# Patient Record
Sex: Female | Born: 1960 | Race: White | Hispanic: No | Smoking: Never smoker
Health system: Southern US, Community
[De-identification: ages and names within clinical notes are randomized; demographics above are authoritative.]

## PROBLEM LIST (undated history)

## (undated) DIAGNOSIS — I519 Heart disease, unspecified: Secondary | ICD-10-CM

## (undated) DIAGNOSIS — Z9289 Personal history of other medical treatment: Secondary | ICD-10-CM

## (undated) DIAGNOSIS — K219 Gastro-esophageal reflux disease without esophagitis: Secondary | ICD-10-CM

## (undated) DIAGNOSIS — T8859XA Other complications of anesthesia, initial encounter: Secondary | ICD-10-CM

## (undated) DIAGNOSIS — J309 Allergic rhinitis, unspecified: Secondary | ICD-10-CM

## (undated) DIAGNOSIS — I341 Nonrheumatic mitral (valve) prolapse: Secondary | ICD-10-CM

## (undated) DIAGNOSIS — Z8719 Personal history of other diseases of the digestive system: Secondary | ICD-10-CM

## (undated) DIAGNOSIS — J479 Bronchiectasis, uncomplicated: Secondary | ICD-10-CM

## (undated) DIAGNOSIS — Z9889 Other specified postprocedural states: Secondary | ICD-10-CM

## (undated) DIAGNOSIS — Z87442 Personal history of urinary calculi: Secondary | ICD-10-CM

## (undated) DIAGNOSIS — R112 Nausea with vomiting, unspecified: Secondary | ICD-10-CM

## (undated) DIAGNOSIS — D649 Anemia, unspecified: Secondary | ICD-10-CM

## (undated) DIAGNOSIS — N879 Dysplasia of cervix uteri, unspecified: Secondary | ICD-10-CM

## (undated) DIAGNOSIS — I219 Acute myocardial infarction, unspecified: Secondary | ICD-10-CM

## (undated) DIAGNOSIS — N760 Acute vaginitis: Secondary | ICD-10-CM

## (undated) DIAGNOSIS — M1990 Unspecified osteoarthritis, unspecified site: Secondary | ICD-10-CM

## (undated) DIAGNOSIS — Z8674 Personal history of sudden cardiac arrest: Secondary | ICD-10-CM

## (undated) DIAGNOSIS — N189 Chronic kidney disease, unspecified: Secondary | ICD-10-CM

## (undated) DIAGNOSIS — J45909 Unspecified asthma, uncomplicated: Secondary | ICD-10-CM

## (undated) DIAGNOSIS — R519 Headache, unspecified: Secondary | ICD-10-CM

## (undated) DIAGNOSIS — G4730 Sleep apnea, unspecified: Secondary | ICD-10-CM

## (undated) DIAGNOSIS — T7840XA Allergy, unspecified, initial encounter: Secondary | ICD-10-CM

## (undated) DIAGNOSIS — R87619 Unspecified abnormal cytological findings in specimens from cervix uteri: Secondary | ICD-10-CM

## (undated) DIAGNOSIS — J45991 Cough variant asthma: Secondary | ICD-10-CM

## (undated) DIAGNOSIS — F419 Anxiety disorder, unspecified: Secondary | ICD-10-CM

## (undated) DIAGNOSIS — T4145XA Adverse effect of unspecified anesthetic, initial encounter: Secondary | ICD-10-CM

## (undated) DIAGNOSIS — J449 Chronic obstructive pulmonary disease, unspecified: Secondary | ICD-10-CM

## (undated) DIAGNOSIS — J189 Pneumonia, unspecified organism: Secondary | ICD-10-CM

## (undated) DIAGNOSIS — K589 Irritable bowel syndrome without diarrhea: Secondary | ICD-10-CM

## (undated) DIAGNOSIS — H269 Unspecified cataract: Secondary | ICD-10-CM

## (undated) DIAGNOSIS — I10 Essential (primary) hypertension: Secondary | ICD-10-CM

## (undated) DIAGNOSIS — I209 Angina pectoris, unspecified: Secondary | ICD-10-CM

## (undated) DIAGNOSIS — I5189 Other ill-defined heart diseases: Secondary | ICD-10-CM

## (undated) DIAGNOSIS — I509 Heart failure, unspecified: Secondary | ICD-10-CM

## (undated) DIAGNOSIS — K900 Celiac disease: Secondary | ICD-10-CM

## (undated) DIAGNOSIS — I2510 Atherosclerotic heart disease of native coronary artery without angina pectoris: Secondary | ICD-10-CM

## (undated) DIAGNOSIS — N951 Menopausal and female climacteric states: Secondary | ICD-10-CM

## (undated) DIAGNOSIS — E7800 Pure hypercholesterolemia, unspecified: Secondary | ICD-10-CM

## (undated) HISTORY — DX: Unspecified abnormal cytological findings in specimens from cervix uteri: R87.619

## (undated) HISTORY — DX: Acute vaginitis: N76.0

## (undated) HISTORY — DX: Sleep apnea, unspecified: G47.30

## (undated) HISTORY — DX: Unspecified cataract: H26.9

## (undated) HISTORY — DX: Personal history of other medical treatment: Z92.89

## (undated) HISTORY — DX: Nonrheumatic mitral (valve) prolapse: I34.1

## (undated) HISTORY — DX: Celiac disease: K90.0

## (undated) HISTORY — DX: Anxiety disorder, unspecified: F41.9

## (undated) HISTORY — DX: Anemia, unspecified: D64.9

## (undated) HISTORY — DX: Heart disease, unspecified: I51.9

## (undated) HISTORY — DX: Dysplasia of cervix uteri, unspecified: N87.9

## (undated) HISTORY — DX: Menopausal and female climacteric states: N95.1

## (undated) HISTORY — PX: CARDIAC SURGERY: SHX584

## (undated) HISTORY — DX: Allergic rhinitis, unspecified: J30.9

## (undated) HISTORY — DX: Gastro-esophageal reflux disease without esophagitis: K21.9

## (undated) HISTORY — DX: Chronic obstructive pulmonary disease, unspecified: J44.9

## (undated) HISTORY — PX: BACK SURGERY: SHX140

## (undated) HISTORY — DX: Allergy, unspecified, initial encounter: T78.40XA

---
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
## 1980-01-09 HISTORY — PX: OOPHORECTOMY: SHX86

## 1986-01-08 DIAGNOSIS — R87619 Unspecified abnormal cytological findings in specimens from cervix uteri: Secondary | ICD-10-CM

## 1986-01-08 DIAGNOSIS — N879 Dysplasia of cervix uteri, unspecified: Secondary | ICD-10-CM

## 1986-01-08 HISTORY — PX: OTHER SURGICAL HISTORY: SHX169

## 1986-01-08 HISTORY — DX: Dysplasia of cervix uteri, unspecified: N87.9

## 1986-01-08 HISTORY — DX: Unspecified abnormal cytological findings in specimens from cervix uteri: R87.619

## 1999-01-09 HISTORY — PX: DILATION AND CURETTAGE OF UTERUS: SHX78

## 2007-10-08 ENCOUNTER — Ambulatory Visit: Payer: Self-pay | Admitting: Internal Medicine

## 2007-10-20 ENCOUNTER — Ambulatory Visit: Payer: Self-pay | Admitting: Internal Medicine

## 2009-10-04 ENCOUNTER — Ambulatory Visit: Payer: Self-pay | Admitting: Internal Medicine

## 2010-06-28 ENCOUNTER — Ambulatory Visit: Payer: Self-pay | Admitting: Internal Medicine

## 2010-07-03 ENCOUNTER — Ambulatory Visit: Payer: Self-pay | Admitting: Internal Medicine

## 2010-07-06 ENCOUNTER — Ambulatory Visit: Payer: Self-pay | Admitting: Internal Medicine

## 2010-08-09 HISTORY — PX: COLONOSCOPY: SHX174

## 2010-08-09 HISTORY — PX: ESOPHAGOGASTRODUODENOSCOPY: SHX1529

## 2010-09-08 ENCOUNTER — Ambulatory Visit: Payer: Self-pay | Admitting: Unknown Physician Specialty

## 2010-09-11 ENCOUNTER — Emergency Department: Payer: Self-pay | Admitting: Unknown Physician Specialty

## 2010-09-14 LAB — PATHOLOGY REPORT

## 2011-01-09 HISTORY — PX: ABLATION: SHX5711

## 2011-01-09 HISTORY — PX: COMBINED HYSTEROSCOPY DIAGNOSTIC / D&C: SUR297

## 2011-05-25 ENCOUNTER — Emergency Department: Payer: Self-pay | Admitting: Unknown Physician Specialty

## 2011-05-25 LAB — COMPREHENSIVE METABOLIC PANEL
Albumin: 3.5 g/dL (ref 3.4–5.0)
Alkaline Phosphatase: 82 U/L (ref 50–136)
Anion Gap: 10 (ref 7–16)
BUN: 16 mg/dL (ref 7–18)
Bilirubin,Total: 0.2 mg/dL (ref 0.2–1.0)
Calcium, Total: 8.3 mg/dL — ABNORMAL LOW (ref 8.5–10.1)
Chloride: 108 mmol/L — ABNORMAL HIGH (ref 98–107)
Co2: 23 mmol/L (ref 21–32)
Creatinine: 0.95 mg/dL (ref 0.60–1.30)
EGFR (African American): >60
EGFR (Non-African Amer.): >60
Glucose: 113 mg/dL — ABNORMAL HIGH (ref 65–99)
Osmolality: 283 (ref 275–301)
Potassium: 3.5 mmol/L (ref 3.5–5.1)
SGOT(AST): 17 U/L (ref 15–37)
SGPT (ALT): 16 U/L
Sodium: 141 mmol/L (ref 136–145)
Total Protein: 7.1 g/dL (ref 6.4–8.2)

## 2011-05-25 LAB — LIPASE, BLOOD: Lipase: 151 U/L (ref 73–393)

## 2011-05-25 LAB — CK TOTAL AND CKMB (NOT AT ARMC)
CK, Total: 38 U/L (ref 21–215)
CK-MB: <0.5 ng/mL — ABNORMAL LOW (ref 0.5–3.6)

## 2011-05-25 LAB — CBC
HCT: 42.6 % (ref 35.0–47.0)
HGB: 14.3 g/dL (ref 12.0–16.0)
MCH: 30.3 pg (ref 26.0–34.0)
MCHC: 33.6 g/dL (ref 32.0–36.0)
MCV: 90 fL (ref 80–100)
Platelet: 262 10*3/uL (ref 150–440)
RBC: 4.73 10*6/uL (ref 3.80–5.20)
RDW: 13.4 % (ref 11.5–14.5)
WBC: 8.8 10*3/uL (ref 3.6–11.0)

## 2011-05-25 LAB — MAGNESIUM: Magnesium: 1.7 mg/dL — ABNORMAL LOW

## 2011-05-25 LAB — TROPONIN I: Troponin-I: <0.02 ng/mL

## 2011-05-27 HISTORY — PX: OTHER SURGICAL HISTORY: SHX169

## 2011-06-10 ENCOUNTER — Emergency Department: Payer: Self-pay | Admitting: Emergency Medicine

## 2011-06-10 LAB — CBC
HCT: 43.9 % (ref 35.0–47.0)
HGB: 14.6 g/dL (ref 12.0–16.0)
MCH: 29.8 pg (ref 26.0–34.0)
MCHC: 33.2 g/dL (ref 32.0–36.0)
MCV: 90 fL (ref 80–100)
Platelet: 237 10*3/uL (ref 150–440)
RBC: 4.9 10*6/uL (ref 3.80–5.20)
RDW: 13.3 % (ref 11.5–14.5)
WBC: 6.2 10*3/uL (ref 3.6–11.0)

## 2011-06-10 LAB — BASIC METABOLIC PANEL
Anion Gap: 11 (ref 7–16)
BUN: 12 mg/dL (ref 7–18)
Calcium, Total: 9.1 mg/dL (ref 8.5–10.1)
Chloride: 108 mmol/L — ABNORMAL HIGH (ref 98–107)
Co2: 23 mmol/L (ref 21–32)
Creatinine: 0.89 mg/dL (ref 0.60–1.30)
EGFR (African American): >60
EGFR (Non-African Amer.): >60
Glucose: 128 mg/dL — ABNORMAL HIGH (ref 65–99)
Osmolality: 285 (ref 275–301)
Potassium: 3.5 mmol/L (ref 3.5–5.1)
Sodium: 142 mmol/L (ref 136–145)

## 2011-06-10 LAB — CK TOTAL AND CKMB (NOT AT ARMC)
CK, Total: 61 U/L (ref 21–215)
CK-MB: 2.8 ng/mL (ref 0.5–3.6)

## 2011-06-10 LAB — TROPONIN I: Troponin-I: 0.05 ng/mL

## 2011-10-03 ENCOUNTER — Ambulatory Visit: Payer: Self-pay | Admitting: Internal Medicine

## 2011-11-26 ENCOUNTER — Ambulatory Visit: Payer: Self-pay | Admitting: Obstetrics and Gynecology

## 2011-11-26 LAB — CBC
HCT: 30.1 % — ABNORMAL LOW (ref 35.0–47.0)
HGB: 9.8 g/dL — ABNORMAL LOW (ref 12.0–16.0)
MCH: 27.5 pg (ref 26.0–34.0)
MCHC: 32.6 g/dL (ref 32.0–36.0)
MCV: 84 fL (ref 80–100)
Platelet: 313 10*3/uL (ref 150–440)
RBC: 3.58 10*6/uL — ABNORMAL LOW (ref 3.80–5.20)
RDW: 14.9 % — ABNORMAL HIGH (ref 11.5–14.5)
WBC: 7.6 10*3/uL (ref 3.6–11.0)

## 2011-11-26 LAB — BASIC METABOLIC PANEL
Anion Gap: 5 — ABNORMAL LOW (ref 7–16)
BUN: 14 mg/dL (ref 7–18)
Calcium, Total: 8.8 mg/dL (ref 8.5–10.1)
Chloride: 107 mmol/L (ref 98–107)
Co2: 25 mmol/L (ref 21–32)
Creatinine: 0.72 mg/dL (ref 0.60–1.30)
EGFR (African American): >60
EGFR (Non-African Amer.): >60
Glucose: 83 mg/dL (ref 65–99)
Osmolality: 273 (ref 275–301)
Potassium: 4.2 mmol/L (ref 3.5–5.1)
Sodium: 137 mmol/L (ref 136–145)

## 2011-11-29 ENCOUNTER — Ambulatory Visit: Payer: Self-pay | Admitting: Obstetrics and Gynecology

## 2011-12-03 LAB — PATHOLOGY REPORT

## 2012-01-29 ENCOUNTER — Ambulatory Visit: Payer: Self-pay | Admitting: Internal Medicine

## 2012-04-21 ENCOUNTER — Ambulatory Visit: Payer: Self-pay | Admitting: Internal Medicine

## 2012-04-22 ENCOUNTER — Encounter: Payer: Self-pay | Admitting: Internal Medicine

## 2012-05-08 ENCOUNTER — Encounter: Payer: Self-pay | Admitting: Internal Medicine

## 2012-06-08 ENCOUNTER — Encounter: Payer: Self-pay | Admitting: Internal Medicine

## 2012-07-08 ENCOUNTER — Encounter: Payer: Self-pay | Admitting: Internal Medicine

## 2012-10-31 DIAGNOSIS — I10 Essential (primary) hypertension: Secondary | ICD-10-CM | POA: Insufficient documentation

## 2012-10-31 DIAGNOSIS — I2510 Atherosclerotic heart disease of native coronary artery without angina pectoris: Secondary | ICD-10-CM | POA: Insufficient documentation

## 2012-10-31 DIAGNOSIS — Z955 Presence of coronary angioplasty implant and graft: Secondary | ICD-10-CM | POA: Insufficient documentation

## 2012-10-31 DIAGNOSIS — E7800 Pure hypercholesterolemia, unspecified: Secondary | ICD-10-CM | POA: Insufficient documentation

## 2012-12-01 ENCOUNTER — Ambulatory Visit (INDEPENDENT_AMBULATORY_CARE_PROVIDER_SITE_OTHER): Payer: BC Managed Care – PPO

## 2012-12-01 ENCOUNTER — Encounter: Payer: Self-pay | Admitting: Podiatry

## 2012-12-01 ENCOUNTER — Ambulatory Visit (INDEPENDENT_AMBULATORY_CARE_PROVIDER_SITE_OTHER): Payer: BC Managed Care – PPO | Admitting: Podiatry

## 2012-12-01 VITALS — BP 129/86 | HR 72 | Resp 16 | Ht 65.0 in | Wt 180.0 lb

## 2012-12-01 DIAGNOSIS — M79671 Pain in right foot: Secondary | ICD-10-CM

## 2012-12-01 DIAGNOSIS — M79609 Pain in unspecified limb: Secondary | ICD-10-CM

## 2012-12-01 DIAGNOSIS — M722 Plantar fascial fibromatosis: Secondary | ICD-10-CM

## 2012-12-01 DIAGNOSIS — S92309A Fracture of unspecified metatarsal bone(s), unspecified foot, initial encounter for closed fracture: Secondary | ICD-10-CM

## 2012-12-01 NOTE — Progress Notes (Signed)
N PAIN L RIGHT FOOT LATERAL D 22M O SUDDEN C WORSE A WALKING,NIGHT TIME, T WEARS CAM WALKER, MULTIPLE BRACES, SEEN DR Katrinka Blazing AT [facility], X-RAYS,

## 2012-12-01 NOTE — Progress Notes (Signed)
Brenda Craig is a 52 year old white female who presents today with a chief complaint of pain to her right foot x8 months. [facility] having just received a coronary artery stent when she fell hurting her right fifth metatarsal. She states that the pain was so severe that she threw up all of her the place. She has since seen Dr. Tamala Julian who initially thought it was a sprain been a strain and a possible hairline fracture. He has gone from stabilizers to a Cam Walker at this point. Never identifying the fracture.  Objective: Vital signs are stable she is alert and oriented x3 I have reviewed her past medical history medications and allergies as well as her review of systems. Pulses are strongly palpable to the bilateral lower extremity. Neurologic sensorium is intact bilateral deep tendon reflexes are symmetrical and equal bilateral. Muscle strength is 5 over 5 dorsiflexors plantar flexors inverters and evertors. Orthopedic evaluation Mr. is all joints distal to the ankle have a full range of motion without crepitation. She does have pain on direct palpation of the fifth metatarsal base right foot. Radiographic evaluation does demonstrate a semi-oblique fracture of the very base of the fifth metatarsal. Lateral radiograph does not demonstrate this but the AP and the oblique do. 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  Assessment: Nonunion fifth metatarsal base right  Plan: Continue use of the Cam Walker and we are going to ask for an Exogen bone stimulator. I will followup with her in one month

## 2012-12-29 ENCOUNTER — Ambulatory Visit: Payer: BC Managed Care – PPO | Admitting: Podiatry

## 2013-01-17 DIAGNOSIS — K219 Gastro-esophageal reflux disease without esophagitis: Secondary | ICD-10-CM

## 2013-01-17 DIAGNOSIS — I25118 Atherosclerotic heart disease of native coronary artery with other forms of angina pectoris: Secondary | ICD-10-CM | POA: Insufficient documentation

## 2013-01-17 DIAGNOSIS — J45901 Unspecified asthma with (acute) exacerbation: Secondary | ICD-10-CM | POA: Insufficient documentation

## 2013-01-17 DIAGNOSIS — F419 Anxiety disorder, unspecified: Secondary | ICD-10-CM | POA: Insufficient documentation

## 2013-01-17 DIAGNOSIS — I10 Essential (primary) hypertension: Secondary | ICD-10-CM | POA: Insufficient documentation

## 2013-01-17 DIAGNOSIS — I2510 Atherosclerotic heart disease of native coronary artery without angina pectoris: Secondary | ICD-10-CM | POA: Insufficient documentation

## 2013-01-17 DIAGNOSIS — E785 Hyperlipidemia, unspecified: Secondary | ICD-10-CM | POA: Insufficient documentation

## 2013-01-17 DIAGNOSIS — J45909 Unspecified asthma, uncomplicated: Secondary | ICD-10-CM | POA: Insufficient documentation

## 2013-01-17 HISTORY — DX: Gastro-esophageal reflux disease without esophagitis: K21.9

## 2013-02-02 ENCOUNTER — Encounter: Payer: Self-pay | Admitting: Podiatry

## 2013-02-02 ENCOUNTER — Ambulatory Visit (INDEPENDENT_AMBULATORY_CARE_PROVIDER_SITE_OTHER): Payer: BC Managed Care – PPO | Admitting: Podiatry

## 2013-02-02 ENCOUNTER — Ambulatory Visit (INDEPENDENT_AMBULATORY_CARE_PROVIDER_SITE_OTHER): Payer: BC Managed Care – PPO

## 2013-02-02 VITALS — BP 135/83 | HR 68 | Resp 16

## 2013-02-02 DIAGNOSIS — S92901A Unspecified fracture of right foot, initial encounter for closed fracture: Secondary | ICD-10-CM

## 2013-02-02 DIAGNOSIS — M7750 Other enthesopathy of unspecified foot: Secondary | ICD-10-CM

## 2013-02-02 DIAGNOSIS — M7670 Peroneal tendinitis, unspecified leg: Secondary | ICD-10-CM

## 2013-02-02 NOTE — Progress Notes (Signed)
The right foot not feeling any better. She states that she's been wearing the boot religiously and she still having pain over the fifth metatarsal and the peroneal tendons area. She also continues to use her bone stimulator.  Objective: Vital signs are stable she is alert and oriented x3. Pulses are strongly palpable right. She still has tenderness on palpation of the fifth metatarsal the right foot. Without edema. She has tenderness on palpation of the peroneal tendons just at the takeoff from the fifth met base and the cuboid. At this point all conservative therapies seem to have failed as far as pain goes radiographically the fifth metatarsal base looks much better.  Assessment: Slowly healing fifth metatarsal base right. Continued just is painful along the fifth met base and the peroneal tendons.   Plan: At this point since we may made no headway in the pain department I suggested an MRI to rule out a tear of the peroneal tendons.

## 2013-02-04 ENCOUNTER — Telehealth: Payer: Self-pay | Admitting: *Deleted

## 2013-02-04 NOTE — Telephone Encounter (Signed)
Brenda Craig called from [place]. Brenda Craig #[id number] valid 1.27.15 thru 2.25.15 for MRI of right foot.

## 2013-02-11 ENCOUNTER — Ambulatory Visit: Payer: Self-pay | Admitting: Podiatry

## 2013-02-11 ENCOUNTER — Ambulatory Visit: Payer: Self-pay | Admitting: Orthopedic Surgery

## 2013-02-17 ENCOUNTER — Telehealth: Payer: Self-pay | Admitting: *Deleted

## 2013-02-17 NOTE — Telephone Encounter (Signed)
Pt called asking if we received her MRI. i called pt and left voicemail letting her know we did receive MRI and dr Milinda Pointer is going to have it sent out for re-read. Stated once 2nd read comes in we will call pt to make appt and go over results.

## 2013-03-17 ENCOUNTER — Encounter: Payer: Self-pay | Admitting: Podiatry

## 2013-03-19 ENCOUNTER — Ambulatory Visit (INDEPENDENT_AMBULATORY_CARE_PROVIDER_SITE_OTHER): Payer: BC Managed Care – PPO | Admitting: Podiatry

## 2013-03-19 VITALS — BP 115/75 | HR 82 | Resp 16 | Ht 66.0 in | Wt 181.0 lb

## 2013-03-19 DIAGNOSIS — S92901A Unspecified fracture of right foot, initial encounter for closed fracture: Secondary | ICD-10-CM

## 2013-03-19 DIAGNOSIS — M7670 Peroneal tendinitis, unspecified leg: Secondary | ICD-10-CM

## 2013-03-19 DIAGNOSIS — S92909A Unspecified fracture of unspecified foot, initial encounter for closed fracture: Secondary | ICD-10-CM

## 2013-03-19 DIAGNOSIS — M7750 Other enthesopathy of unspecified foot: Secondary | ICD-10-CM

## 2013-03-20 NOTE — Progress Notes (Signed)
She presents today for followup of her MRI results to her right foot. She states that the right foot still hurts particularly in here as he points to the fifth metatarsal base of the right foot. She continues to wear her Cam Gilford Rile a regular basis.  Objective: Vital signs are stable she is alert and oriented x3. Presents today in her [employer]. Pulses are intact and she has pain on palpation of the peroneal brevis as it near its insertion on the fifth metatarsal base. 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  Assessment: Peroneal tendinitis fifth metatarsal base fracture right foot.  Plan: Discussed etiology pathology conservative versus surgical therapies. I encouraged her to continue use her bone stimulator. We did discuss the need for surgical intervention. I will followup with her at the end of April for a may surgery. 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  X-rays will be taken when she comes in for surgical consent.

## 2013-04-02 ENCOUNTER — Ambulatory Visit: Payer: BC Managed Care – PPO | Admitting: Podiatry

## 2013-04-21 ENCOUNTER — Ambulatory Visit: Payer: Self-pay | Admitting: Internal Medicine

## 2013-05-11 ENCOUNTER — Ambulatory Visit: Payer: BC Managed Care – PPO | Admitting: Podiatry

## 2013-05-19 ENCOUNTER — Telehealth: Payer: Self-pay | Admitting: Podiatry

## 2013-05-19 NOTE — Telephone Encounter (Signed)
CALLED PT TO LET HER KNOW THAT WE WOULD NEED A SIGNED RELEASE FOR THE INFORMATION THAT SHE IS REQUESTING. THAT THE REQUESTING PROVIDER CAN SEND [place] THAT AND WE COULD THEN GET EVERYTHING TAKEN CARE OF FOR HER.

## 2013-05-25 DIAGNOSIS — H53121 Transient visual loss, right eye: Secondary | ICD-10-CM | POA: Insufficient documentation

## 2013-05-27 ENCOUNTER — Ambulatory Visit (INDEPENDENT_AMBULATORY_CARE_PROVIDER_SITE_OTHER): Payer: BC Managed Care – PPO

## 2013-05-27 ENCOUNTER — Ambulatory Visit (INDEPENDENT_AMBULATORY_CARE_PROVIDER_SITE_OTHER): Payer: BC Managed Care – PPO | Admitting: Podiatry

## 2013-05-27 VITALS — BP 128/88 | HR 80 | Resp 16

## 2013-05-27 DIAGNOSIS — S92309A Fracture of unspecified metatarsal bone(s), unspecified foot, initial encounter for closed fracture: Secondary | ICD-10-CM

## 2013-05-27 NOTE — Progress Notes (Signed)
Brenda Craig presents today still complaining of pain to the lateral aspect of her right ankle. She states that the fifth metatarsal itself feels much better. An MRI demonstrated an old nonunion to the base of the fifth metatarsal of the right foot which is no longer tender on palpation however she still has pain with her peroneal tendons.  Objective: Vital signs are stable she is alert and oriented x3. Pulses are strongly palpable right. She has pain on plantarflexion and inversion indicative of peroneal longus tendinitis of the right ankle. She has no reproducible pain on palpation of the fifth metatarsal base right and radiographic evaluation does not demonstrate any type of osseous abnormalities in this area.  Assessment: Resolved fifth metatarsal pain. Peroneal longus tendinitis right.  Plan: We discussed the etiology pathology conservative versus surgical therapies. At this point she's going followup with the orthopedist for evaluation of bruising to her buttocks and her right thigh. She's also goes start physical therapy with the orthopedist and I suggested that they include the peroneal tendon as well. I will followup with her in the near future for reevaluation or the fall for surgical consideration is necessary.

## 2013-06-09 ENCOUNTER — Observation Stay: Payer: Self-pay | Admitting: Internal Medicine

## 2013-06-09 LAB — BASIC METABOLIC PANEL
Anion Gap: 8 (ref 7–16)
BUN: 18 mg/dL (ref 7–18)
Calcium, Total: 9.2 mg/dL (ref 8.5–10.1)
Chloride: 106 mmol/L (ref 98–107)
Co2: 24 mmol/L (ref 21–32)
Creatinine: 0.82 mg/dL (ref 0.60–1.30)
EGFR (African American): >60
EGFR (Non-African Amer.): >60
Glucose: 87 mg/dL (ref 65–99)
Osmolality: 277 (ref 275–301)
Potassium: 3.8 mmol/L (ref 3.5–5.1)
Sodium: 138 mmol/L (ref 136–145)

## 2013-06-09 LAB — CBC
HCT: 40.4 % (ref 35.0–47.0)
HGB: 12.9 g/dL (ref 12.0–16.0)
MCH: 29.7 pg (ref 26.0–34.0)
MCHC: 32.1 g/dL (ref 32.0–36.0)
MCV: 93 fL (ref 80–100)
Platelet: 271 10*3/uL (ref 150–440)
RBC: 4.36 10*6/uL (ref 3.80–5.20)
RDW: 15.3 % — ABNORMAL HIGH (ref 11.5–14.5)
WBC: 7.3 10*3/uL (ref 3.6–11.0)

## 2013-06-09 LAB — TROPONIN I
Troponin-I: <0.02 ng/mL
Troponin-I: <0.02 ng/mL

## 2013-06-10 DIAGNOSIS — E785 Hyperlipidemia, unspecified: Secondary | ICD-10-CM

## 2013-06-10 DIAGNOSIS — R079 Chest pain, unspecified: Secondary | ICD-10-CM

## 2013-06-10 LAB — CBC WITH DIFFERENTIAL/PLATELET
Basophil #: 0 10*3/uL (ref 0.0–0.1)
Basophil %: 0.7 %
Eosinophil #: 0.1 10*3/uL (ref 0.0–0.7)
Eosinophil %: 1.6 %
HCT: 36.1 % (ref 35.0–47.0)
HGB: 11.9 g/dL — ABNORMAL LOW (ref 12.0–16.0)
Lymphocyte #: 2 10*3/uL (ref 1.0–3.6)
Lymphocyte %: 31.6 %
MCH: 30.3 pg (ref 26.0–34.0)
MCHC: 33 g/dL (ref 32.0–36.0)
MCV: 92 fL (ref 80–100)
Monocyte #: 0.7 x10 3/mm (ref 0.2–0.9)
Monocyte %: 10.8 %
Neutrophil #: 3.5 10*3/uL (ref 1.4–6.5)
Neutrophil %: 55.3 %
Platelet: 214 10*3/uL (ref 150–440)
RBC: 3.94 10*6/uL (ref 3.80–5.20)
RDW: 15.2 % — ABNORMAL HIGH (ref 11.5–14.5)
WBC: 6.4 10*3/uL (ref 3.6–11.0)

## 2013-06-10 LAB — BASIC METABOLIC PANEL
Anion Gap: 5 — ABNORMAL LOW (ref 7–16)
BUN: 14 mg/dL (ref 7–18)
Calcium, Total: 8.9 mg/dL (ref 8.5–10.1)
Chloride: 109 mmol/L — ABNORMAL HIGH (ref 98–107)
Co2: 29 mmol/L (ref 21–32)
Creatinine: 0.87 mg/dL (ref 0.60–1.30)
EGFR (African American): >60
EGFR (Non-African Amer.): >60
Glucose: 90 mg/dL (ref 65–99)
Osmolality: 285 (ref 275–301)
Potassium: 3.7 mmol/L (ref 3.5–5.1)
Sodium: 143 mmol/L (ref 136–145)

## 2013-06-10 LAB — TROPONIN I: Troponin-I: <0.02 ng/mL

## 2013-06-10 LAB — HCG, QUANTITATIVE, PREGNANCY: Beta Hcg, Quant.: <1 m[IU]/mL — ABNORMAL LOW

## 2013-07-02 ENCOUNTER — Ambulatory Visit: Payer: Self-pay | Admitting: Internal Medicine

## 2013-07-22 ENCOUNTER — Ambulatory Visit: Payer: Self-pay | Admitting: Orthopedic Surgery

## 2013-07-22 DIAGNOSIS — K900 Celiac disease: Secondary | ICD-10-CM | POA: Insufficient documentation

## 2013-08-11 DIAGNOSIS — E663 Overweight: Secondary | ICD-10-CM | POA: Insufficient documentation

## 2013-08-17 ENCOUNTER — Ambulatory Visit: Payer: Self-pay | Admitting: Unknown Physician Specialty

## 2013-08-19 LAB — PATHOLOGY REPORT

## 2013-09-24 ENCOUNTER — Emergency Department: Payer: Self-pay | Admitting: Internal Medicine

## 2013-10-06 ENCOUNTER — Ambulatory Visit: Payer: Self-pay | Admitting: Orthopedic Surgery

## 2014-01-08 HISTORY — PX: HIP SURGERY: SHX245

## 2014-02-19 ENCOUNTER — Ambulatory Visit: Payer: Self-pay | Admitting: Internal Medicine

## 2014-03-09 ENCOUNTER — Ambulatory Visit: Payer: Self-pay | Admitting: Orthopedic Surgery

## 2014-04-23 ENCOUNTER — Ambulatory Visit
Admit: 2014-04-23 | Disposition: A | Discharge status: Home / Self Care | Payer: Self-pay | Attending: Physical Medicine and Rehabilitation | Admitting: Physical Medicine and Rehabilitation

## 2014-05-01 NOTE — Discharge Summary (Signed)
PATIENT NAME:  Brenda Craig, Brenda Craig MR#:  [id number] DATE OF BIRTH:  02-15-1960  DATE OF ADMISSION:  06/09/2013 DATE OF DISCHARGE:  06/10/2013   ADMISSION DIAGNOSIS: Chest pain.   DISCHARGE DIAGNOSES:  1. Chest pain with history of coronary artery disease.  2. Hypertension.  3. Gastroesophageal reflux disease.   CONSULTATIONS: None.   LABORATORY DATA:  White blood cells 6.4, hemoglobin 12, hematocrit 36.1, platelets are 214.  Sodium 143, potassium 3.7, chloride 109, bicarbonate 29, BUN 14, creatinine 0.87, glucose is 90.  Troponins x3 were negative.  MYOVIEW was negative for acute ischemia   HOSPITAL COURSE: A 54 year old female who presented with chest pain/back spasm, who was admitted for chest pain, rule out acute coronary syndrome. For further details, please refer to Dr. Trena Platt H and P.   1. Chest pain. The patient has extensive coronary artery disease history with recent stents. She was admitted to telemetry. Troponins were negative. She underwent a Myoview stress test which did not show eveidence of ischemia. She was continued on her outpatient medications.  2. Hypertension. The patient will continue her outpatient medications. 3. Gastroesophageal reflux disease. The patient was continued on her PPI.  4. Psychiatric. The patient is on Zoloft.   DISCHARGE MEDICATIONS:  1. Protonix 40 mg b.i.d.  2. Lorazepam 1 mg t.i.d.  3. Aspirin 81 mg daily.  4. Singulair 10 mg daily.  5. Brilinta 90 mg b.i.d.  6. Zantac 150 mg at bedtime.  7. Zoloft 50 mg at bedtime.  8. Nitroglycerin sublingual p.r.n. chest pain.  9. Crestor 5 mg daily.  10. Coreg 3.125 b.i.d.   DISCHARGE DIET: Low sodium.   DISCHARGE ACTIVITY: As tolerated.   DISCHARGE FOLLOWUP: The patient will follow up with her cardiologist, Dr. Manuella Ghazi, in 1 week.   TIME SPENT: 40 minutes.   ____________________________ Donell Beers. Benjie Karvonen, MD spm:lb D: 06/10/2013 11:43:11 ET T: 06/10/2013 12:20:12 ET JOB#: [id number]  cc: Alysha Doolan  P. Benjie Karvonen, MD, <Dictator> Randel Pigg, MD, [facility] Shamari Lofquist P Hamdi Vari MD ELECTRONICALLY SIGNED 06/11/2013 12:37

## 2014-05-01 NOTE — Consult Note (Signed)
General Aspect 54 year old female with a known history of cad, cardiac arrest in May 2013, stent at that time, repeat angina with stent placement early 2014, also with history ofhypertension,  celiac disease, GERD, who is being admitted for unstable angina. cardiology was consult in for chest pain.  last night, The patient started having shaky feeling and clamminess  associated with some chest pain, mainly in the back of her chest. It was 7 out of 10 in severity, continued all night. She could not sleep more than few hours.  yesterday she was driving to go for her physical therapy for her hip tendinitis and broken foot, but started feeling thesame symptoms again with sweatiness and clamminess. She was also feeling nauseous and decided to come to the Emergency Department.   in the emergency room, pain was 3 out of 10, with chest pressure/spasm in the back of her chest.she reports symptoms were similar to prior to her old cardiac arrest in 2013.   Present Illness .  PAST MEDICAL HISTORY: 1.  Hypertension.  2.  Celiac disease.  3.  Coronary artery disease, status post stenting x 2.  4.  Prolapsed mitral valve.  5.  GERD.  6.  Colitis.   PAST SURGICAL HISTORY:  C-section.   ALLERGIES: CODEINE AND GLUTEN.   SOCIAL HISTORY: No smoking. Occasional alcohol. No IV drugs of abuse. She works as a Pharmacist, hospital.   FAMILY HISTORY: Father has emphysema, also has coronary artery disease and history of TIA.   Physical Exam:  GEN well developed, well nourished, no acute distress   HEENT hearing intact to voice, moist oral mucosa   NECK supple   RESP normal resp effort  clear BS   CARD Regular rate and rhythm  No murmur   ABD denies tenderness  soft   EXTR negative edema   NEURO motor/sensory function intact   PSYCH alert, A+O to time, place, person, good insight   Review of Systems:  Subjective/Chief Complaint chest pain, malaise   General: Weakness   Skin: No Complaints   ENT: No  Complaints   Eyes: No Complaints   Neck: No Complaints   Respiratory: No Complaints   Cardiovascular: Chest pain or discomfort   Genitourinary: No Complaints   Vascular: No Complaints   Musculoskeletal: No Complaints   Neurologic: No Complaints   Hematologic: No Complaints   Endocrine: No Complaints   Psychiatric: No Complaints   Review of Systems: All other systems were reviewed and found to be negative   Medications/Allergies Reviewed Medications/Allergies reviewed     Stent:    MI:    HTN:    celiacs disease:    prolapse mitral valve:    GERD:    Colitis:    Cesarean Section: 1986 1989       Admit Diagnosis:   CHEST PAIN BACK PAIN: Onset Date: 10-Jun-2013, Status: Active, Description: CHEST PAIN BACK PAIN  Home Medications: Medication Instructions Status  Protonix 40 mg oral delayed release tablet 1 tab(s) orally 2 times a day Active  lorazepam 1 mg oral tablet 1 tab(s) orally 3 times a day Active  aspirin 81 mg oral tablet 1 tab(s) orally once a day (in the morning) Active  Singulair 10 mg oral tablet 1 tab(s) orally once a day (at bedtime) Active  Brilinta 90 mg oral tablet 1 tab(s) orally 2 times a day Active  Zantac 150 oral tablet 1 tab(s) orally once a day (at bedtime) Active  sertraline 50 mg oral tablet 1  tab(s) orally once a day (at bedtime) Active  Nitrostat 0.4 mg 1 tab(s) sublingual every 5 min. X 3 then call 911. Took 2 weeks ago 2 days straight Active  Crestor 5 mg oral tablet 1 tab(s) orally once a day (at bedtime) Active  carvedilol 3.125 mg oral tablet 1 tab(s) orally 2 times a day Active   Lab Results:  Routine Chem:  03-Jun-15 02:09   HCG Betasubunit Quant. Serum  < 1 (1-3  (International Unit)  ----------------- Non-pregnant <5 Weeks Post LMP mIU/mL  3- 4 wk 9 - 130  4- 5 wk 75 - 2,600  5- 6 wk 850 - 20,800  6- 7 wk 4,000 - 100,000  7-12 wk 11,500 - 289,000 12-16 wk 18,000 - 137,000 16-29 wk 1,400 - 53,000 29-41 wk  940 - 60,000)  Glucose, Serum 90  BUN 14  Creatinine (comp) 0.87  Sodium, Serum 143  Potassium, Serum 3.7  Chloride, Serum  109  CO2, Serum 29  Calcium (Total), Serum 8.9  Anion Gap  5  Osmolality (calc) 285  eGFR (African American) >60  eGFR (Non-African American) >60 (eGFR values <41m/min/1.73 m2 may be an indication of chronic kidney disease (CKD). Calculated eGFR is useful in patients with stable renal function. The eGFR calculation will not be reliable in acutely ill patients when serum creatinine is changing rapidly. It is not useful in  patients on dialysis. The eGFR calculation may not be applicable to patients at the low and high extremes of body sizes, pregnant women, and vegetarians.)  Cardiac:  02-Jun-15 18:02   Troponin I < 0.02 (0.00-0.05 0.05 ng/mL or less: NEGATIVE  Repeat testing in 3-6 hrs  if clinically indicated. >0.05 ng/mL: POTENTIAL  MYOCARDIAL INJURY. Repeat  testing in 3-6 hrs if  clinically indicated. NOTE: An increase or decrease  of 30% or more on serial  testing suggests a  clinically important change)    21:36   Troponin I < 0.02 (0.00-0.05 0.05 ng/mL or less: NEGATIVE  Repeat testing in 3-6 hrs  if clinically indicated. >0.05 ng/mL: POTENTIAL  MYOCARDIAL INJURY. Repeat  testing in 3-6 hrs if  clinically indicated. NOTE: An increase or decrease  of 30% or more on serial  testing suggests a  clinically important change)  03-Jun-15 02:09   Troponin I < 0.02 (0.00-0.05 0.05 ng/mL or less: NEGATIVE  Repeat testing in 3-6 hrs  if clinically indicated. >0.05 ng/mL: POTENTIAL  MYOCARDIAL INJURY. Repeat  testing in 3-6 hrs if  clinically indicated. NOTE: An increase or decrease  of 30% or more on serial  testing suggests a  clinically important change)  Routine Hem:  03-Jun-15 02:09   WBC (CBC) 6.4  RBC (CBC) 3.94  Hemoglobin (CBC)  11.9  Hematocrit (CBC) 36.1  Platelet Count (CBC) 214  MCV 92  MCH 30.3  MCHC 33.0  RDW  15.2   Neutrophil % 55.3  Lymphocyte % 31.6  Monocyte % 10.8  Eosinophil % 1.6  Basophil % 0.7  Neutrophil # 3.5  Lymphocyte # 2.0  Monocyte # 0.7  Eosinophil # 0.1  Basophil # 0.0 (Result(s) reported on 10 Jun 2013 at 02:39AM.)   EKG:  Interpretation EKG: Shows NSR with no acute ST-T changes,  low-voltage QRS.   Radiology Results: XRay:    02-Jun-15 18:33, Chest PA and Lateral  Chest PA and Lateral   REASON FOR EXAM:    CP  COMMENTS:       PROCEDURE: DXR - DXR CHEST PA (OR  AP) AND LATERAL  - Jun 09 2013  6:33PM     CLINICAL DATA:  Chest pain.    EXAM:  CHEST  2 VIEW    COMPARISON:  06/10/2011 at 1620 hr    FINDINGS:  Cardiac silhouette is normal in size. There is a new left coronary  artery stent. Linear opacity in the lung bases is similar to the  prior study consistent with atelectasis. The lungs are otherwise  clear.    No mediastinal or hilar masses. No pleural effusion or pneumothorax.    Bony thorax is demineralized but intact.     IMPRESSION:  No acute cardiopulmonary disease.      Electronically Signed    By: Lajean Manes M.D.    On: 06/09/2013 18:37     Verified By: Lasandra Beech, M.D.,    Codeine: N/V/Diarrhea, Rash, Hives  Gluten: GI Distress  Vital Signs/Nurse's Notes: **Vital Signs.:   03-Jun-15 05:08  Vital Signs Type Routine  Temperature Temperature (F) 98.1  Celsius 36.7  Temperature Source oral  Pulse Pulse 56  Respirations Respirations 18  Systolic BP Systolic BP 094  Diastolic BP (mmHg) Diastolic BP (mmHg) 77  Mean BP 93  Pulse Ox % Pulse Ox % 95  Pulse Ox Activity Level  At rest  Oxygen Delivery Room Air/ 21 %    Impression 1.  Chest pain:   extensive coronary disease with 2 stents, prior cardiac arrest cardiac enz negative, no significant findings on EKG recent Myoview at [facility] in January 2015, which was negative,  sees cardiologist at [facility].  -- continue her aspirin, b-blocker, statin, brilinta,  and  nitroglycerin PRN --Stress test order placed by hospitalist (results pending) If she continues to have symptoms as an outpt,  may have to perform a catheterization for clarification Contact info on our office provided for local care  2.  Anxiety/depression:  on ativan, zoloft  3) Hyperlipidemia:  Will continue Crestor. Goal LDL <70  4) GERD on PPI   Electronic Signatures: Ida Rogue (MD)  (Signed 03-Jun-15 13:05)  Authored: General Aspect/Present Illness, History and Physical Exam, Review of System, Past Medical History, Health Issues, Home Medications, Labs, EKG , Radiology, Allergies, Vital Signs/Nurse's Notes, Impression/Plan   Last Updated: 03-Jun-15 13:05 by Ida Rogue (MD)

## 2014-05-01 NOTE — H&P (Signed)
PATIENT NAME:  Brenda Craig, Brenda Craig MR#:  [id number] DATE OF BIRTH:  [birth year]  DATE OF ADMISSION:  06/09/2013  PRIMARY CARE PHYSICIAN: Dr. Benita Stabile.  CARDIOLOGY: [facility], Dr. Bartholomew Boards.    REQUESTING PHYSICIAN: Dr. Mariea Clonts.   CHIEF COMPLAINT: Chest pain.   HISTORY OF PRESENT ILLNESS: The patient is a 54 year old female with a known history of hypertension, coronary artery disease, celiac disease, GERD, who is being admitted for unstable angina. The patient started having shaky feeling and clamminess last night associated with some chest pain, mainly in the back of her chest. It was 7 out of 10 in severity, continued all night. She could not sleep more than few hours and was driving to go for her physical therapy for her hip tendinitis and broken foot, but started feeling same symptoms again with sweatiness and clamminess. She was also feeling nauseous and decided to come to the Emergency Department. While in the ED, her pain now is 3 out of 10, but she is starting to get similar chest pressure/spasm in the back of her chest, and considering her extensive cardiac history, she is being admitted for further evaluation and management.   PAST MEDICAL HISTORY: 1.  Hypertension.  2.  Celiac disease.  3.  Coronary artery disease, status post stenting x 2.  4.  Prolapsed mitral valve.  5.  GERD.  6.  Colitis.   PAST SURGICAL HISTORY:  C-section.   ALLERGIES: CODEINE AND GLUTEN.   SOCIAL HISTORY: No smoking. Occasional alcohol. No IV drugs of abuse. She works as a Pharmacist, hospital.   FAMILY HISTORY: Father has emphysema, also has coronary artery disease and history of TIA.   MEDICATIONS AT HOME: 1.  Aspirin 81 mg p.o. daily. 2.  Brilinta 90 mg p.o. b.i.d.  3.  Coreg 3.125 mg p.o. b.i.d.  4.  Crestor 5 mg p.o. at bedtime.  5.  Lorazepam 1 mg p.o. 3 times a day.  6.  Nitrostat 0.4 mg sublingual every 5 minutes.  7.  Protonix 40 mg p.o. b.i.d.  8.  Sertraline 50 mg p.o. at bedtime.  9.   Singulair once daily. 10.  Zantac 150 mg p.o. at bedtime.   REVIEW OF SYSTEMS:    CONSTITUTIONAL: No fever, fatigue, weakness.  EYES: No blurred or double vision.  ENT: No tinnitus or ear pain.  RESPIRATORY: No cough, wheezing, hemoptysis.  CARDIOVASCULAR: Positive for chest pain. No orthopnea, edema.  GASTROINTESTINAL: Positive for nausea. No vomiting or diarrhea.  GENITOURINARY: No dysuria or hematuria.  ENDOCRINE: No polyuria or nocturia.  HEMATOLOGIC: No anemia or easy bruising.  SKIN: No rash or lesion.  MUSCULOSKELETAL: She has a recent history of broken foot and tendinitis of the hip, for which she has been following with physical therapy.  NEUROLOGIC: No tingling, numbness, weakness.  PSYCHIATRIC: Positive for anxiety and depression.   PHYSICAL EXAMINATION: VITAL SIGNS: Temperature 98.1, heart rate 59 per minute, respirations 18 per minute, blood pressure 143/86 mmHg. She is saturating 96% on room air.  GENERAL: The patient is a 54 year old female lying in the bed comfortably without any acute distress.  EYES: Pupils equal, round, reactive to light and accommodation. No scleral icterus. Extraocular muscles intact.  HENT: Head atraumatic, normocephalic. Oropharynx and nasopharynx clear.  NECK: Supple. No jugular venous distention. No thyroid enlargement or tenderness.  LUNGS: Clear to auscultation bilaterally. No wheezing, rales, rhonchi, or crepitation.  CARDIOVASCULAR: S1, S2 normal. No murmur, rubs, or gallop.  ABDOMEN: Soft, nontender, nondistended. Bowel sounds present. No  organomegaly or masses.  EXTREMITIES: No pedal edema, cyanosis, or clubbing.  NEUROLOGIC: Cranial nerves II through XII intact. Muscle strength 5 out of 5 in all extremities. Sensation intact.  PSYCHIATRIC: The patient is alert and oriented x 3.  SKIN: No obvious rash, lesion, or ulcer.   LABORATORY PANEL: Normal BMP. Normal CBC. Normal first set of troponin.   EKG: Showed no acute ST-T changes, normal  sinus rhythm, low-voltage QRS.   CHEST X-RAY: Showed no acute cardiopulmonary disease.   IMPRESSION AND PLAN: 1.  Chest pain: Likely noncardiac, but considering her extensive coronary disease with 2 stents, will admit her for observation with serial troponins. Get Myoview in the morning. Obtain cardiology consultation. She reports having a recent Myoview at [facility] in January 2015, which was negative, and she did have a followup with her cardiologist at [facility]. When she saw her cardiologist 2 to 3 weeks ago at [facility], she was doing fine. Will continue her aspirin and nitroglycerin.  2.  Anxiety/depression: Will continue her lorazepam and sertraline. 3.  Gastroesophageal reflux disease: Will continue Protonix.  4.  Hyperlipidemia: Will continue Crestor.   TOTAL TIME TAKING CARE OF THIS PATIENT: 45 minutes.   CODE STATUS: Full code.    ____________________________ Lucina Mellow. Manuella Ghazi, MD vss:jcm D: 06/09/2013 20:25:51 ET T: 06/09/2013 21:22:06 ET JOB#: [id number]  cc: Mollie Rossano S. Manuella Ghazi, MD, <Dictator> Leona Carry. Hall Busing, [facility], Dr. Joan Mayans. Remer Macho [facility] MD ELECTRONICALLY SIGNED 06/11/2013 9:56

## 2014-05-30 DIAGNOSIS — Z01810 Encounter for preprocedural cardiovascular examination: Secondary | ICD-10-CM | POA: Insufficient documentation

## 2014-10-08 ENCOUNTER — Encounter: Payer: Self-pay | Admitting: Emergency Medicine

## 2014-10-08 ENCOUNTER — Emergency Department: Payer: BC Managed Care – PPO

## 2014-10-08 ENCOUNTER — Observation Stay
Admission: EM | Admit: 2014-10-08 | Discharge: 2014-10-10 | Disposition: A | Discharge status: Home / Self Care | Payer: BC Managed Care – PPO | Attending: Internal Medicine | Admitting: Internal Medicine

## 2014-10-08 DIAGNOSIS — Z885 Allergy status to narcotic agent status: Secondary | ICD-10-CM | POA: Insufficient documentation

## 2014-10-08 DIAGNOSIS — G8929 Other chronic pain: Secondary | ICD-10-CM | POA: Diagnosis not present

## 2014-10-08 DIAGNOSIS — R945 Abnormal results of liver function studies: Secondary | ICD-10-CM | POA: Diagnosis not present

## 2014-10-08 DIAGNOSIS — R9431 Abnormal electrocardiogram [ECG] [EKG]: Secondary | ICD-10-CM | POA: Diagnosis not present

## 2014-10-08 DIAGNOSIS — Z7982 Long term (current) use of aspirin: Secondary | ICD-10-CM | POA: Diagnosis not present

## 2014-10-08 DIAGNOSIS — E7800 Pure hypercholesterolemia, unspecified: Secondary | ICD-10-CM | POA: Diagnosis not present

## 2014-10-08 DIAGNOSIS — K900 Celiac disease: Secondary | ICD-10-CM | POA: Insufficient documentation

## 2014-10-08 DIAGNOSIS — I25119 Atherosclerotic heart disease of native coronary artery with unspecified angina pectoris: Secondary | ICD-10-CM | POA: Insufficient documentation

## 2014-10-08 DIAGNOSIS — Z955 Presence of coronary angioplasty implant and graft: Secondary | ICD-10-CM | POA: Insufficient documentation

## 2014-10-08 DIAGNOSIS — I252 Old myocardial infarction: Secondary | ICD-10-CM | POA: Diagnosis not present

## 2014-10-08 DIAGNOSIS — F419 Anxiety disorder, unspecified: Secondary | ICD-10-CM | POA: Insufficient documentation

## 2014-10-08 DIAGNOSIS — R110 Nausea: Secondary | ICD-10-CM | POA: Insufficient documentation

## 2014-10-08 DIAGNOSIS — I4901 Ventricular fibrillation: Secondary | ICD-10-CM | POA: Insufficient documentation

## 2014-10-08 DIAGNOSIS — M8580 Other specified disorders of bone density and structure, unspecified site: Secondary | ICD-10-CM | POA: Diagnosis not present

## 2014-10-08 DIAGNOSIS — I209 Angina pectoris, unspecified: Secondary | ICD-10-CM | POA: Insufficient documentation

## 2014-10-08 DIAGNOSIS — R1011 Right upper quadrant pain: Secondary | ICD-10-CM | POA: Insufficient documentation

## 2014-10-08 DIAGNOSIS — Z79899 Other long term (current) drug therapy: Secondary | ICD-10-CM | POA: Diagnosis not present

## 2014-10-08 DIAGNOSIS — Z888 Allergy status to other drugs, medicaments and biological substances status: Secondary | ICD-10-CM | POA: Diagnosis not present

## 2014-10-08 DIAGNOSIS — R0789 Other chest pain: Secondary | ICD-10-CM

## 2014-10-08 DIAGNOSIS — M546 Pain in thoracic spine: Secondary | ICD-10-CM | POA: Insufficient documentation

## 2014-10-08 DIAGNOSIS — M549 Dorsalgia, unspecified: Secondary | ICD-10-CM | POA: Insufficient documentation

## 2014-10-08 DIAGNOSIS — E785 Hyperlipidemia, unspecified: Secondary | ICD-10-CM | POA: Diagnosis not present

## 2014-10-08 DIAGNOSIS — K219 Gastro-esophageal reflux disease without esophagitis: Secondary | ICD-10-CM | POA: Diagnosis not present

## 2014-10-08 DIAGNOSIS — R109 Unspecified abdominal pain: Secondary | ICD-10-CM

## 2014-10-08 DIAGNOSIS — Z8679 Personal history of other diseases of the circulatory system: Secondary | ICD-10-CM | POA: Diagnosis not present

## 2014-10-08 DIAGNOSIS — I2510 Atherosclerotic heart disease of native coronary artery without angina pectoris: Secondary | ICD-10-CM | POA: Insufficient documentation

## 2014-10-08 DIAGNOSIS — J45909 Unspecified asthma, uncomplicated: Secondary | ICD-10-CM | POA: Diagnosis not present

## 2014-10-08 DIAGNOSIS — M545 Low back pain: Secondary | ICD-10-CM | POA: Insufficient documentation

## 2014-10-08 DIAGNOSIS — R079 Chest pain, unspecified: Secondary | ICD-10-CM | POA: Diagnosis present

## 2014-10-08 DIAGNOSIS — I200 Unstable angina: Secondary | ICD-10-CM | POA: Insufficient documentation

## 2014-10-08 DIAGNOSIS — I10 Essential (primary) hypertension: Secondary | ICD-10-CM | POA: Insufficient documentation

## 2014-10-08 DIAGNOSIS — N281 Cyst of kidney, acquired: Secondary | ICD-10-CM | POA: Insufficient documentation

## 2014-10-08 DIAGNOSIS — F329 Major depressive disorder, single episode, unspecified: Secondary | ICD-10-CM | POA: Insufficient documentation

## 2014-10-08 DIAGNOSIS — M7071 Other bursitis of hip, right hip: Secondary | ICD-10-CM | POA: Insufficient documentation

## 2014-10-08 LAB — CBC
HCT: 38.9 % (ref 35.0–47.0)
HCT: 38.5 % (ref 35.0–47.0)
Hemoglobin: 13 g/dL (ref 12.0–16.0)
Hemoglobin: 12.7 g/dL (ref 12.0–16.0)
MCH: 28.5 pg (ref 26.0–34.0)
MCH: 28.2 pg (ref 26.0–34.0)
MCHC: 33.5 g/dL (ref 32.0–36.0)
MCHC: 33.1 g/dL (ref 32.0–36.0)
MCV: 84.9 fL (ref 80.0–100.0)
MCV: 85.1 fL (ref 80.0–100.0)
Platelets: 234 10*3/uL (ref 150–440)
Platelets: 232 10*3/uL (ref 150–440)
RBC: 4.58 MIL/uL (ref 3.80–5.20)
RBC: 4.53 MIL/uL (ref 3.80–5.20)
RDW: 14.5 % (ref 11.5–14.5)
RDW: 15 % — ABNORMAL HIGH (ref 11.5–14.5)
WBC: 7.2 10*3/uL (ref 3.6–11.0)
WBC: 7.7 10*3/uL (ref 3.6–11.0)

## 2014-10-08 LAB — BASIC METABOLIC PANEL
Anion gap: 6 (ref 5–15)
BUN: 15 mg/dL (ref 6–20)
CO2: 26 mmol/L (ref 22–32)
Calcium: 9.2 mg/dL (ref 8.9–10.3)
Chloride: 109 mmol/L (ref 101–111)
Creatinine, Ser: 0.8 mg/dL (ref 0.44–1.00)
GFR calc Af Amer: >60 mL/min (ref >60)
GFR calc non Af Amer: >60 mL/min (ref >60)
Glucose, Bld: 113 mg/dL — ABNORMAL HIGH (ref 65–99)
Potassium: 4.2 mmol/L (ref 3.5–5.1)
Sodium: 141 mmol/L (ref 135–145)

## 2014-10-08 LAB — CREATININE, SERUM
Creatinine, Ser: 0.81 mg/dL (ref 0.44–1.00)
GFR calc Af Amer: >60 mL/min (ref >60)
GFR calc non Af Amer: >60 mL/min (ref >60)

## 2014-10-08 LAB — TROPONIN I
Troponin I: <0.03 ng/mL (ref <0.031)
Troponin I: <0.03 ng/mL (ref <0.031)

## 2014-10-08 MED ORDER — NITROGLYCERIN 0.4 MG SL SUBL
0.4000 mg | SUBLINGUAL_TABLET | SUBLINGUAL | Status: DC | PRN
  Administered 2014-10-08: 0.4 mg via SUBLINGUAL
  Filled 2014-10-08: qty 1

## 2014-10-08 MED ORDER — DIAZEPAM 5 MG/ML IJ SOLN
2.0000 mg | Freq: Once | INTRAMUSCULAR | Status: AC
  Administered 2014-10-08: 2 mg via INTRAVENOUS
  Filled 2014-10-08: qty 2

## 2014-10-08 MED ORDER — ASPIRIN EC 81 MG PO TBEC
81.0000 mg | DELAYED_RELEASE_TABLET | Freq: Every day | ORAL | Status: DC
  Administered 2014-10-09 – 2014-10-10 (×2): 81 mg via ORAL
  Filled 2014-10-08 (×2): qty 1

## 2014-10-08 MED ORDER — CARVEDILOL 3.125 MG PO TABS
3.1250 mg | ORAL_TABLET | Freq: Two times a day (BID) | ORAL | Status: DC
  Administered 2014-10-08 – 2014-10-10 (×4): 3.125 mg via ORAL
  Filled 2014-10-08 (×4): qty 1

## 2014-10-08 MED ORDER — CARVEDILOL 3.125 MG PO TABS: 3.1250 mg | ORAL_TABLET | Freq: Two times a day (BID) | ORAL | Status: DC

## 2014-10-08 MED ORDER — MONTELUKAST SODIUM 10 MG PO TABS: 10.0000 mg | ORAL_TABLET | Freq: Every day | ORAL | Status: DC

## 2014-10-08 MED ORDER — ALUM & MAG HYDROXIDE-SIMETH 200-200-20 MG/5ML PO SUSP: 30.0000 mL | Freq: Four times a day (QID) | ORAL | Status: DC | PRN

## 2014-10-08 MED ORDER — ONDANSETRON HCL 4 MG PO TABS
4.0000 mg | ORAL_TABLET | Freq: Four times a day (QID) | ORAL | Status: DC | PRN
  Administered 2014-10-09: 4 mg via ORAL
  Filled 2014-10-08: qty 1

## 2014-10-08 MED ORDER — PANTOPRAZOLE SODIUM 40 MG PO TBEC
40.0000 mg | DELAYED_RELEASE_TABLET | Freq: Two times a day (BID) | ORAL | Status: DC
  Administered 2014-10-08 – 2014-10-10 (×4): 40 mg via ORAL
  Filled 2014-10-08 (×4): qty 1

## 2014-10-08 MED ORDER — OCUVITE-LUTEIN PO CAPS
1.0000 | ORAL_CAPSULE | Freq: Every day | ORAL | Status: DC
  Administered 2014-10-09 – 2014-10-10 (×2): 1 via ORAL
  Filled 2014-10-08 (×2): qty 1

## 2014-10-08 MED ORDER — ACETAMINOPHEN 325 MG PO TABS
650.0000 mg | ORAL_TABLET | Freq: Four times a day (QID) | ORAL | Status: DC | PRN
  Administered 2014-10-08 – 2014-10-09 (×3): 650 mg via ORAL
  Filled 2014-10-08 (×3): qty 2

## 2014-10-08 MED ORDER — ONDANSETRON HCL 4 MG/2ML IJ SOLN: 4.0000 mg | Freq: Four times a day (QID) | INTRAMUSCULAR | Status: DC | PRN

## 2014-10-08 MED ORDER — LORAZEPAM 1 MG PO TABS
1.0000 mg | ORAL_TABLET | ORAL | Status: DC | PRN
  Administered 2014-10-08 – 2014-10-09 (×3): 1 mg via ORAL
  Filled 2014-10-08 (×3): qty 1

## 2014-10-08 MED ORDER — ROSUVASTATIN CALCIUM 5 MG PO TABS
5.0000 mg | ORAL_TABLET | Freq: Every day | ORAL | Status: DC
  Administered 2014-10-08 – 2014-10-09 (×2): 5 mg via ORAL
  Filled 2014-10-08 (×3): qty 1

## 2014-10-08 MED ORDER — ENOXAPARIN SODIUM 40 MG/0.4ML ~~LOC~~ SOLN: 40.0000 mg | SUBCUTANEOUS | Status: DC

## 2014-10-08 MED ORDER — NITROGLYCERIN 0.4 MG SL SUBL: 0.4000 mg | SUBLINGUAL_TABLET | SUBLINGUAL | Status: DC | PRN

## 2014-10-08 MED ORDER — FENTANYL CITRATE (PF) 100 MCG/2ML IJ SOLN
50.0000 ug | Freq: Once | INTRAMUSCULAR | Status: AC
  Administered 2014-10-08: 50 ug via INTRAVENOUS
  Filled 2014-10-08: qty 2

## 2014-10-08 MED ORDER — TICAGRELOR 90 MG PO TABS
90.0000 mg | ORAL_TABLET | Freq: Two times a day (BID) | ORAL | Status: DC
  Administered 2014-10-08 – 2014-10-10 (×4): 90 mg via ORAL
  Filled 2014-10-08 (×4): qty 1

## 2014-10-08 MED ORDER — VITAMIN C 500 MG PO TABS
500.0000 mg | ORAL_TABLET | Freq: Every day | ORAL | Status: DC
  Administered 2014-10-09 – 2014-10-10 (×2): 500 mg via ORAL
  Filled 2014-10-08 (×2): qty 1

## 2014-10-08 MED ORDER — MONTELUKAST SODIUM 10 MG PO TABS
10.0000 mg | ORAL_TABLET | Freq: Every day | ORAL | Status: DC
  Administered 2014-10-08 – 2014-10-09 (×2): 10 mg via ORAL
  Filled 2014-10-08 (×2): qty 1

## 2014-10-08 MED ORDER — MORPHINE SULFATE (PF) 2 MG/ML IV SOLN
2.0000 mg | INTRAVENOUS | Status: DC | PRN
  Administered 2014-10-08 – 2014-10-09 (×3): 2 mg via INTRAVENOUS
  Filled 2014-10-08 (×3): qty 1

## 2014-10-08 MED ORDER — SERTRALINE HCL 50 MG PO TABS
50.0000 mg | ORAL_TABLET | Freq: Every day | ORAL | Status: DC
  Administered 2014-10-08 – 2014-10-09 (×2): 50 mg via ORAL
  Filled 2014-10-08 (×2): qty 1

## 2014-10-08 MED ORDER — BENZONATATE 100 MG PO CAPS
100.0000 mg | ORAL_CAPSULE | Freq: Two times a day (BID) | ORAL | Status: DC
  Administered 2014-10-09 – 2014-10-10 (×3): 100 mg via ORAL
  Filled 2014-10-08 (×3): qty 1

## 2014-10-08 MED ORDER — VITAMIN B-12 1000 MCG PO TABS
1000.0000 ug | ORAL_TABLET | Freq: Every day | ORAL | Status: DC
  Administered 2014-10-09 – 2014-10-10 (×2): 1000 ug via ORAL
  Filled 2014-10-08 (×2): qty 1

## 2014-10-08 NOTE — ED Notes (Signed)
Pt comes into the ED via EMS c/o shoulder blade pain that started this morning.  Patient also c/o nausea, dizziness, and headache.  Patient has extensive cardiac history including cardiac stents placed in 2013 and 2014.  Patient went into cardiac arrest in 2013.  Patient has taken 3 nitro throughout the day and has taken 5 baby aspirin.  12 lead unremarkable and BP at 146/60.

## 2014-10-08 NOTE — ED Provider Notes (Signed)
[facility] Emergency Department Provider Note  ____________________________________________  Time seen: Approximately545pm  I have reviewed the triage vital signs and the nursing notes.   HISTORY  Chief Complaint Shoulder Pain and Nausea    HPI Brenda Craig is a 54 y.o. female with a history of coronary artery disease on Brilinta who is presenting today with back pain since 8 AM this morning. She says she is also had right arm aching over the last 3 days. Says that she tried 3 nitroglycerin at home which relieved the pain temporarily but the pain returned. She says that she has had stents placed in 2014 and is compliant with her medications. The stents were placed at a hospital in [place]. He took 4 baby aspirin prior to arrival. Has taken her Brilinta today. Says that the pain at this time is a 7 out of 10 and to the mid thoracic spine which is consistent with her previous MIs. She says it feels like a pressure. Denies any shortness of breath. Denies any exertional symptoms. Denies worsening with movement.    Past Medical History  Diagnosis Date  . Heart trouble   . Celiac disease     There are no active problems to display for this patient.   Past Surgical History  Procedure Laterality Date  . Heart stents    . Ablation      Current Outpatient Rx  Name  Route  Sig  Dispense  Refill  . benzonatate (TESSALON) 100 MG capsule               . BRILINTA 90 MG TABS tablet               . carvedilol (COREG) 3.125 MG tablet               . chlorpheniramine-HYDROcodone (TUSSIONEX) 10-8 MG/5ML LQCR               . CRESTOR 5 MG tablet               . [facility] ASPIRIN LOW STRENGTH 81 MG chewable tablet               . hydrocortisone (ANUSOL-HC) 25 MG suppository               . LORazepam (ATIVAN) 1 MG tablet               . montelukast (SINGULAIR) 10 MG tablet               . NITROSTAT 0.4 MG SL tablet                . pantoprazole (PROTONIX) 20 MG tablet               . ranitidine (ZANTAC) 150 MG tablet   Oral   Take 150 mg by mouth daily.         . sertraline (ZOLOFT) 50 MG tablet                 Allergies Codeine  No family history on file.  Social History Social History  Substance Use Topics  . Smoking status: Never Smoker   . Smokeless tobacco: Never Used  . Alcohol Use: Yes     Comment: OCCASIONALLY    Review of Systems Constitutional: No fever/chills Eyes: No visual changes. ENT: No sore throat. Cardiovascular: Denies chest pain. Respiratory: Denies shortness of breath. Gastrointestinal: No abdominal pain.  No nausea, no vomiting.  No diarrhea.  No constipation. Genitourinary: Negative for dysuria. Musculoskeletal: As above  Skin: Negative for rash. Neurological: Negative for headaches, focal weakness or numbness.  10-point ROS otherwise negative.  ____________________________________________   PHYSICAL EXAM:  VITAL SIGNS: ED Triage Vitals  Enc Vitals Group     BP 10/08/14 1723 129/80 mmHg     Pulse Rate 10/08/14 1723 71     Resp 10/08/14 1723 16     Temp 10/08/14 1723 98.7 F (37.1 C)     Temp Source 10/08/14 1723 Oral     SpO2 10/08/14 1723 96 %     Weight 10/08/14 1723 198 lb (89.812 kg)     Height 10/08/14 1723 5' 5"  (1.651 m)     Head Cir --      Peak Flow --      Pain Score 10/08/14 1724 7     Pain Loc --      Pain Edu? --      Excl. in [place]? --     Constitutional: Alert and oriented. Well appearing and in no acute distress. Eyes: Conjunctivae are normal. PERRL. EOMI. Head: Atraumatic. Nose: No congestion/rhinnorhea. Mouth/Throat: Mucous membranes are moist.  Oropharynx non-erythematous. Neck: No stridor.   Cardiovascular: Normal rate, regular rhythm. Grossly normal heart sounds.  Good peripheral circulation. Equal bilateral radial pulses. Respiratory: Normal respiratory effort.  No retractions. Lungs CTAB. Gastrointestinal: Soft and  nontender. No distention. No abdominal bruits. No CVA tenderness. Musculoskeletal: Mild tenderness to the midthoracic spine as well as to the left rhomboid group. Neurologic:  Normal speech and language. No gross focal neurologic deficits are appreciated. No gait instability. Skin:  Skin is warm, dry and intact. No rash noted. Psychiatric: Mood and affect are normal. Speech and behavior are normal.  ____________________________________________   LABS (all labs ordered are listed, but only abnormal results are displayed)  Labs Reviewed  BASIC METABOLIC PANEL - Abnormal; Notable for the following:    Glucose, Bld 113 (*)    All other components within normal limits  CBC  TROPONIN I   ____________________________________________  EKG  ED ECG REPORT I, Doran Stabler, the attending physician, personally viewed and interpreted this ECG.   Date: 10/08/2014  EKG Time: 1725  Rate: 66  Rhythm: normal sinus rhythm  Axis: Normal axis  Intervals:none  ST&T Change: Q waves in 3 and aVF. No ST segment elevation or depression. No abnormal T-wave inversion.  ED ECG REPORT I, Doran Stabler, the attending physician, personally viewed and interpreted this ECG.   Date: 10/08/2014  EKG Time: 1933  Rate: 67  Rhythm: normal sinus rhythm  Axis: Normal axis  Intervals:none  ST&T Change: Persisting Q waves in 3 and aVF. No abnormal T-wave inversions. No ST elevation or depression.  ____________________________________________  RADIOLOGY  No acute finding on the chest x-ray. I personally reviewed these films. ____________________________________________   PROCEDURES  ___________________________________________   INITIAL IMPRESSION / ASSESSMENT AND PLAN / ED COURSE  Pertinent labs & imaging results that were available during my care of the patient were reviewed by me and considered in my medical decision making (see chart for  details).  ----------------------------------------- 7:51 PM on 10/08/2014 -----------------------------------------  Patient with persistent chest pain at 7 out of 10 at this time. Michela Pitcher that it was increasing but now back to 7 out of 10. Now radiating to the chest. Repeat EKG done without any change from previous. However, due to patient's high risk features we'll admit to the hospital  for further chest pain workup. Discussed workup results with the patient as well as need for admission. Patient understands plan is one to comply. Signed out to Dr.Konadena.   ____________________________________________   FINAL CLINICAL IMPRESSION(S) / ED DIAGNOSES  Acute chest pain. Initial visit.    Orbie Pyo, MD 10/08/14 [phone]

## 2014-10-08 NOTE — Progress Notes (Signed)
Pt stated she's having a headache and requested tylenol. MD notified. New orders written.

## 2014-10-08 NOTE — H&P (Signed)
[place] at [place] NAME: Brenda Craig    MR#:  [id number]  DATE OF BIRTH:  [birth year]  DATE OF ADMISSION:  10/08/2014  PRIMARY CARE PHYSICIAN: Albina Billet, MD   REQUESTING/REFERRING PHYSICIAN:Shaev  CHIEF COMPLAINT:   Chief Complaint  Patient presents with  . Shoulder Pain    shoulder blade pain  . Nausea    HISTORY OF PRESENT ILLNESS:  Brenda Craig  is a 54 y.o. female with a known history of potential, anxiety, chronic back pain issues, as coronary artery disease, patient follows up with cardiologist DR.P.N.Manuella Ghazi from [facility], comes in secondary to back pain. Patient started to have back pain this morning, blood pressure was 150/90 that time. Patient took nitroglycerin with some relief. Patient back pain persisted throughout the day and the each time nitroglycerin helped.. Patient came to the ER because patient had the atypical pains mainly back pain and the had coronary artery disease and stents were placed and she also had a cardiac arrest one time before. So concerning back pain patient came to ER because she always gets back pain whenever there is a problem  with heart. She'll troponins were negative EKG unremarkable. Patient had 2 stents placed in 2013 and 2014. She is on aspirin, Brilinta. She denies chest pain, nausea, diaphoresis. No shortness of breath.  PAST MEDICAL HISTORY:   Past Medical History  Diagnosis Date  . Heart trouble   . Celiac disease     PAST SURGICAL HISTOIRY:   Past Surgical History  Procedure Laterality Date  . Heart stents    . Ablation      SOCIAL HISTORY:   Social History  Substance Use Topics  . Smoking status: Never Smoker   . Smokeless tobacco: Never Used  . Alcohol Use: Yes     Comment: OCCASIONALLY    FAMILY HISTORY:  No family history on file.  DRUG ALLERGIES:   Allergies  Allergen Reactions  . Codeine Nausea And Vomiting and Rash  . Nexium  [Esomeprazole Magnesium] Palpitations  . Omeprazole Magnesium Palpitations  . Percocet [Oxycodone-Acetaminophen] Itching, Nausea And Vomiting and Rash   surgical history: Patient had a history of fall CAD with stents in 2013, 2014, Back surgeries, bursal surgeries for hip bursa  cesarean section  REVIEW OF SYSTEMS:  CONSTITUTIONAL: No fever, fatigue or weakness.  EYES: No blurred or double vision.  EARS, NOSE, AND THROAT: No tinnitus or ear pain.  RESPIRATORY: No cough, shortness of breath, wheezing or hemoptysis.  CARDIOVASCULAR: No chest pain, orthopnea, edema.  GASTROINTESTINAL: No nausea, vomiting, diarrhea or abdominal pain.  GENITOURINARY: No dysuria, hematuria.  ENDOCRINE: No polyuria, nocturia,  HEMATOLOGY: No anemia, easy bruising or bleeding SKIN: No rash or lesion. MUSCULOSKELETAL: Complains of fall bursitis and hip pain, back pain issues. NEUROLOGIC: No tingling, numbness, weakness.  PSYCHIATRY: No anxiety or depression.   MEDICATIONS AT HOME:   Prior to Admission medications   Medication Sig Start Date End Date Taking? Authorizing Provider  aspirin EC 81 MG tablet Take 81 mg by mouth daily.   Yes Historical Provider, MD  BRILINTA 90 MG TABS tablet Take 90 mg by mouth 2 (two) times daily.  11/06/12  Yes Historical Provider, MD  carvedilol (COREG) 3.125 MG tablet Take 3.125 mg by mouth 2 (two) times daily with a meal.  01/17/13  Yes Historical Provider, MD  CRESTOR 5 MG tablet Take 5 mg by mouth at bedtime.  10/16/12  Yes Historical Provider, MD  LORazepam (ATIVAN) 1 MG tablet Take 1 mg by mouth every 4 (four) hours as needed for anxiety.  11/14/12  Yes Historical Provider, MD  Lutein 20 MG CAPS Take 1 capsule by mouth daily.   Yes Historical Provider, MD  montelukast (SINGULAIR) 10 MG tablet Take 10 mg by mouth at bedtime.   Yes Historical Provider, MD  NITROSTAT 0.4 MG SL tablet Place 0.4 mg under the tongue every 5 (five) minutes as needed for chest pain.  11/03/12  Yes  Historical Provider, MD  pantoprazole (PROTONIX) 20 MG tablet Take 20 mg by mouth 2 (two) times daily.  11/17/12  Yes Historical Provider, MD  ranitidine (ZANTAC) 150 MG tablet Take 150 mg by mouth daily.   Yes Historical Provider, MD  sertraline (ZOLOFT) 50 MG tablet Take 50 mg by mouth at bedtime.  11/17/12  Yes Historical Provider, MD  vitamin B-12 (CYANOCOBALAMIN) 1000 MCG tablet Take 1,000 mcg by mouth daily.   Yes Historical Provider, MD  vitamin C (ASCORBIC ACID) 500 MG tablet Take 500 mg by mouth daily.   Yes Historical Provider, MD  benzonatate (TESSALON) 100 MG capsule  02/01/13   Historical Provider, MD  chlorpheniramine-HYDROcodone ([place]) 10-8 MG/5ML [facility]  10/21/12   Historical Provider, MD  montelukast (SINGULAIR) 10 MG tablet  11/17/12   Historical Provider, MD      VITAL SIGNS:  Blood pressure 103/65, pulse 63, temperature 98.7 F (37.1 C), temperature source Oral, resp. rate 15, height 5\' 5"  (1.651 m), weight 89.812 kg (198 lb), SpO2 96 %.  PHYSICAL EXAMINATION:  GENERAL:  54 y.o.-year-old patient lying in the bed with no acute distress.  EYES: Pupils equal, round, reactive to light and accommodation. No scleral icterus. Extraocular muscles intact.  HEENT: Head atraumatic, normocephalic. Oropharynx and nasopharynx clear.  NECK:  Supple, no jugular venous distention. No thyroid enlargement, no tenderness.  LUNGS: Normal breath sounds bilaterally, no wheezing, rales,rhonchi or crepitation. No use of accessory muscles of respiration.  CARDIOVASCULAR: S1, S2 normal. No murmurs, rubs, or gallops.  ABDOMEN: Soft, nontender, nondistended. Bowel sounds present. No organomegaly or mass.  EXTREMITIES: No pedal edema, cyanosis, or clubbing.  NEUROLOGIC: Cranial nerves II through XII are intact. Muscle strength 5/5 in all extremities. Sensation intact. Gait not checked.  PSYCHIATRIC: The patient is alert and oriented x 3.  SKIN: No obvious rash, lesion, or ulcer.   LABORATORY  PANEL:   CBC  Recent Labs Lab 10/08/14 1729  WBC 7.2  HGB 13.0  HCT 38.9  PLT 234   ------------------------------------------------------------------------------------------------------------------  Chemistries   Recent Labs Lab 10/08/14 1729  NA 141  K 4.2  CL 109  CO2 26  GLUCOSE 113*  BUN 15  CREATININE 0.80  CALCIUM 9.2   ------------------------------------------------------------------------------------------------------------------  Cardiac Enzymes  Recent Labs Lab 10/08/14 1729  TROPONINI <0.03   ------------------------------------------------------------------------------------------------------------------  RADIOLOGY:  Dg Chest 2 View  10/08/2014   CLINICAL DATA:  Hypertension with back pain. History of prior myocardial infarctions  EXAM: CHEST  2 VIEW  COMPARISON:  June 09, 2013  FINDINGS: There is mild scarring in the lung bases. There is no edema or consolidation. The heart size is normal with pulmonary vascularity within normal limits. No adenopathy. No bone lesions. No pneumothorax.  IMPRESSION: Stable bibasilar lung scarring.  No edema or consolidation.   Electronically Signed   By: Lowella Grip III M.D.   On: 10/08/2014 18:01    EKG:   Orders placed or performed during the hospital encounter of 10/08/14  .  EKG 12-Lead  . EKG 12-Lead  . ED EKG within 10 minutes  . ED EKG within 10 minutes   EKG shows normal sinus rhythm at 67 bpm no ST-T changes. IMPRESSION AND PLAN:  1.Back pain , patient has history of coronary artery disease and atypical features with especially back pain: Patient tolerated initial set of troponins are negative, EKG unremarkable. Because of her history of CAD, cardiac arrest admit her to telemetry, continue her aspirin, Bryllinta,, Crestor, Coreg. Add nitroglycerin . Patient requesting morphine for pain relief. If the troponins are negative we will get a Lexiscan stress test. #2 history of asthma: No wheezing at this  time patient. Continue Singulair. 3.depression, anxiety continue Zoloft and Ativan.    All the records are reviewed and case discussed with ED provider. Management plans discussed with the patient, family and they are in agreement.  CODE STATUS: Full  TOTAL TIME TAKING CARE OF THIS PATIENT: 50minutes.    Epifanio Lesches M.D on 10/08/2014 at 8:16 PM  Between 7am to 6pm - Pager - [phone]  After 6pm go to [email or web address] - password [email or web address] [facility]  Office  [phone]  CC: Primary care physician; Albina Billet, MD

## 2014-10-09 ENCOUNTER — Observation Stay: Payer: BC Managed Care – PPO

## 2014-10-09 DIAGNOSIS — I200 Unstable angina: Secondary | ICD-10-CM | POA: Insufficient documentation

## 2014-10-09 DIAGNOSIS — M546 Pain in thoracic spine: Secondary | ICD-10-CM

## 2014-10-09 DIAGNOSIS — Z955 Presence of coronary angioplasty implant and graft: Secondary | ICD-10-CM

## 2014-10-09 DIAGNOSIS — I25111 Atherosclerotic heart disease of native coronary artery with angina pectoris with documented spasm: Secondary | ICD-10-CM

## 2014-10-09 DIAGNOSIS — R079 Chest pain, unspecified: Secondary | ICD-10-CM | POA: Diagnosis not present

## 2014-10-09 DIAGNOSIS — E785 Hyperlipidemia, unspecified: Secondary | ICD-10-CM | POA: Insufficient documentation

## 2014-10-09 DIAGNOSIS — M549 Dorsalgia, unspecified: Secondary | ICD-10-CM | POA: Insufficient documentation

## 2014-10-09 DIAGNOSIS — I209 Angina pectoris, unspecified: Secondary | ICD-10-CM | POA: Insufficient documentation

## 2014-10-09 DIAGNOSIS — I2510 Atherosclerotic heart disease of native coronary artery without angina pectoris: Secondary | ICD-10-CM | POA: Insufficient documentation

## 2014-10-09 LAB — NM MYOCAR MULTI W/SPECT W/WALL MOTION / EF
LV dias vol: 74 mL
LV sys vol: 21 mL
Peak HR: 97 {beats}/min
Rest BP: 128 mmHg
Rest HR: 64 {beats}/min
SDS: 1
SRS: 3
SSS: 6
TID: 1.5

## 2014-10-09 LAB — TROPONIN I: Troponin I: <0.03 ng/mL (ref <0.031)

## 2014-10-09 MED ORDER — TECHNETIUM TC 99M SESTAMIBI - CARDIOLITE
13.0970 | Freq: Once | INTRAVENOUS | Status: AC | PRN
  Administered 2014-10-09: 13.097 via INTRAVENOUS

## 2014-10-09 MED ORDER — REGADENOSON 0.4 MG/5ML IV SOLN
0.4000 mg | Freq: Once | INTRAVENOUS | Status: AC
  Administered 2014-10-09: 0.4 mg via INTRAVENOUS
  Filled 2014-10-09: qty 5

## 2014-10-09 MED ORDER — TECHNETIUM TC 99M SESTAMIBI - CARDIOLITE
29.7850 | Freq: Once | INTRAVENOUS | Status: AC | PRN
  Administered 2014-10-09: 29.785 via INTRAVENOUS

## 2014-10-09 NOTE — Progress Notes (Signed)
[place] at [place] NAME: Brenda Craig    MR#:  [id number]  DATE OF BIRTH:  [birth year]  SUBJECTIVE:seen  At bed side.has back pain,shoulder blade pain.stresss test is negative,but pt is concerned about chest pain ,back pain,wants to speak cardiology.  CHIEF COMPLAINT:   Chief Complaint  Patient presents with  . Shoulder Pain    shoulder blade pain  . Nausea    REVIEW OF SYSTEMS:   ROS CONSTITUTIONAL: No fever, fatigue or weakness.  EYES: No blurred or double vision.  EARS, NOSE, AND THROAT: No tinnitus or ear pain.  RESPIRATORY: No cough, shortness of breath, wheezing or hemoptysis.  CARDIOVASCULAR: No chest pain, orthopnea, edema.  GASTROINTESTINAL: No nausea, vomiting, diarrhea or abdominal pain.  GENITOURINARY: No dysuria, hematuria.  ENDOCRINE: No polyuria, nocturia,  HEMATOLOGY: No anemia, easy bruising or bleeding SKIN: No rash or lesion. MUSCULOSKELETAL: upper  Back pain ./now she c/o chest pain NEUROLOGIC: No tingling, numbness, weakness.  PSYCHIATRY: No anxiety or depression.   DRUG ALLERGIES:   Allergies  Allergen Reactions  . Codeine Nausea And Vomiting and Rash  . Nexium [Esomeprazole Magnesium] Palpitations  . Omeprazole Magnesium Palpitations  . Percocet [Oxycodone-Acetaminophen] Itching, Nausea And Vomiting and Rash    VITALS:  Blood pressure 103/68, pulse 60, temperature 98.4 F (36.9 C), temperature source Oral, resp. rate 18, height 5' 5"  (1.651 m), weight 89.812 kg (198 lb), SpO2 91 %.  PHYSICAL EXAMINATION:  GENERAL:  54 y.o.-year-old patient lying in the bed with no acute distress.  EYES: Pupils equal, round, reactive to light and accommodation. No scleral icterus. Extraocular muscles intact.  HEENT: Head atraumatic, normocephalic. Oropharynx and nasopharynx clear.  NECK:  Supple, no jugular venous distention. No thyroid enlargement, no tenderness.  LUNGS: Normal breath sounds bilaterally,  no wheezing, rales,rhonchi or crepitation. No use of accessory muscles of respiration.  CARDIOVASCULAR: S1, S2 normal. No murmurs, rubs, or gallops.  ABDOMEN: Soft, nontender, nondistended. Bowel sounds present. No organomegaly or mass.  EXTREMITIES: No pedal edema, cyanosis, or clubbing.  NEUROLOGIC: Cranial nerves II through XII are intact. Muscle strength 5/5 in all extremities. Sensation intact. Gait not checked.  PSYCHIATRIC: The patient is alert and oriented x 3.  SKIN: No obvious rash, lesion, or ulcer.    LABORATORY PANEL:   CBC  Recent Labs Lab 10/08/14 2132  WBC 7.7  HGB 12.7  HCT 38.5  PLT 232   ------------------------------------------------------------------------------------------------------------------  Chemistries   Recent Labs Lab 10/08/14 1729 10/08/14 2132  NA 141  --   K 4.2  --   CL 109  --   CO2 26  --   GLUCOSE 113*  --   BUN 15  --   CREATININE 0.80 0.81  CALCIUM 9.2  --    ------------------------------------------------------------------------------------------------------------------  Cardiac Enzymes  Recent Labs Lab 10/09/14 0221  TROPONINI <0.03   ------------------------------------------------------------------------------------------------------------------  RADIOLOGY:  Dg Chest 2 View  10/08/2014   CLINICAL DATA:  Hypertension with back pain. History of prior myocardial infarctions  EXAM: CHEST  2 VIEW  COMPARISON:  June 09, 2013  FINDINGS: There is mild scarring in the lung bases. There is no edema or consolidation. The heart size is normal with pulmonary vascularity within normal limits. No adenopathy. No bone lesions. No pneumothorax.  IMPRESSION: Stable bibasilar lung scarring.  No edema or consolidation.   Electronically Signed   By: Lowella Grip III M.D.   On: 10/08/2014 18:01   Nm Myocar Multi W/spect W/wall  Motion / Ef  10/09/2014    There was no ST segment deviation noted during stress.  Blood pressure  demonstrated a blunted response to exercise.  No clear evidence of stress-induced myocardial ischemia  Overall ejection fraction of 45% with inferolateral wall hypokinesis  This is considered a low riskHistory  This is a low risk study.  Nuclear stress EF: 45%.  The left ventricular ejection fraction is mildly decreased (45-54%).     EKG:   Orders placed or performed during the hospital encounter of 10/08/14  . EKG 12-Lead  . EKG 12-Lead  . ED EKG within 10 minutes  . ED EKG within 10 minutes    ASSESSMENT AND PLAN:  1.atypical chest pain/upper back pain with negative troponin and negative stress test. Pt requesting to see cardiology due to her cardiac history Continue asa,brillinta,crestor,nitrates. 2.h/o asthma;no wheezing\ 3,h/o depression;continue antidepressants      All the records are reviewed and case discussed with Care Management/Social Workerr. Management plans discussed with the patient, family and they are in agreement.  CODE STATUS: full  TOTAL TIME TAKING CARE OF THIS PATIENT:29mnutes.   POSSIBLE D/C IN 35 DAYS, DEPENDING ON CLINICAL CONDITION.   KEpifanio LeschesM.D on 10/09/2014 at 2:49 PM  Between 7am to 6pm - Pager - [phone]  After 6pm go to [email or web address] - password [email or web address]  Office  [phone] CC: Primary care physician; TAlbina Billet MD

## 2014-10-09 NOTE — Progress Notes (Signed)
Skin assessment done on admission and verified by Levester Fresh.

## 2014-10-09 NOTE — Progress Notes (Signed)
Stress test results were read to MD Konidena.

## 2014-10-09 NOTE — Consult Note (Signed)
Cardiology Consultation Note  Patient ID: Brenda Craig, MRN: [id number], DOB/AGE: 1960-12-06 54 y.o. Admit date: 10/08/2014   Date of Consult: 10/09/2014 Primary Physician: Albina Billet, MD Primary Cardiologist: [facility]  Chief Complaint: back pain radiating to the chest Reason for Consult: Angina, unstable  HPI: 54 y.o. female with h/o coronary artery disease, chronic back pain with sciatica, history of ventricular fibrillation and ST elevation myocardial infarction requiring circumflex coronary artery stent in 2013 and unstable angina requiring LAD stent in 2014, recent laminectomy June 2016 per the patient, presenting with back pain between her scapula radiating to her chest.  She reports symptoms presented relatively acutely in the past several days, stuttering, not associated with exertion. She has had discomfort on and off this morning, better last night, significant yesterday. Blood pressure was elevated, she became very anxious and presented for further evaluation. She lives in [place] but sees cardiology in [place] as that is where her boyfriend was living.  Back pain radiating to the front relieved with morphine IV. She reports having allergy to other pain medications, unable to take NSAIDs. She reports laminectomy worked well for a while, now starting to have issues again.  She denies smoking history, no diabetes Coronary artery disease risk factors include a history of high cholesterol, hypertension, overweight, family history of coronary artery disease. There is no history of diabetes or gout.  Multiple family members had premature coronary artery disease and stent placements.  Stress test this admission read by Dr.  Clayborn Bigness details no significant ischemia    prior Results/Studies:  ECG: 05/28/2014-normal sinus rhythm. Left axis deviation. Inferior infarction age undetermined. Abnormal ECG. Compared to previous ECG no acute change.  Bilateral carotid duplex scan  April 21, 2013: negative. Less than 50% stenosis of right and left internal carotid artery. Antegrade flow in both vertebral arteries.  Stress Echocardiogram: Stress echocardiogram by definitely study-negative for ischemia. 13 minutes on stress test. BP response was normal. Grade 2 diastolic dysfunction was noted at rest.   Lipids from PCP dated 10/24/2012. Cholesterol 133, triglycerides 64, HDL 67, LDL 53 all of them excellent.   other past medical history  ST elevation myocardial infarction complicated by ventricular fibrillation requiring circumflex coronary artery stent on 05/27/2011. Unstable angina with increase of LAD stenosis from 70-95% requiring a single LAD stent on March 28, 2012.  Past Medical History  Diagnosis Date  . History of angina  . History of chest pain  . ST elevation MI (STEMI) ([place])  . Hypertension  . High cholesterol  . Abnormal liver function  . GERD (gastroesophageal reflux disease)  . Bronchial asthma  . Ventricular fibrillation ([place])  . Abdominal pain  . Celiac disease ([place])  . History of anxiety  . Tendonitis of right pectoralis major  . Vision loss, right eye  resolved  . Bursitis of right hip  . Osteopenia   Past Surgical History  Procedure Laterality Date  . Cesarean section  3 times  . Colonoscopy  . Coronary stent placement 05/27/11  . Cardiac catheterization 05/27/11  . Cardiac catheterization 03/28/12    Social History  . Marital Status: Divorced  Spouse Name: N/A  . Number of Children: N/A  . Years of Education: N/A   Social History Main Topics  . Smoking status: Never Smoker  . Smokeless tobacco: Never Used  . Alcohol Use: 0.6 oz/week  1 Glasses of wine per week  Comment: occassionally  . Drug Use: No  . Sexual Activity: Not on file  Home Meds: Prior to Admission medications   Medication Sig Start Date End Date Taking? Authorizing Provider  aspirin EC 81 MG tablet Take 81 mg by mouth daily.   Yes Historical  Provider, MD  BRILINTA 90 MG TABS tablet Take 90 mg by mouth 2 (two) times daily.  11/06/12  Yes Historical Provider, MD  carvedilol (COREG) 3.125 MG tablet Take 3.125 mg by mouth 2 (two) times daily with a meal.  01/17/13  Yes Historical Provider, MD  CRESTOR 5 MG tablet Take 5 mg by mouth at bedtime.  10/16/12  Yes Historical Provider, MD  LORazepam (ATIVAN) 1 MG tablet Take 1 mg by mouth every 4 (four) hours as needed for anxiety.  11/14/12  Yes Historical Provider, MD  Lutein 20 MG CAPS Take 1 capsule by mouth daily.   Yes Historical Provider, MD  montelukast (SINGULAIR) 10 MG tablet Take 10 mg by mouth at bedtime.   Yes Historical Provider, MD  NITROSTAT 0.4 MG SL tablet Place 0.4 mg under the tongue every 5 (five) minutes as needed for chest pain.  11/03/12  Yes Historical Provider, MD  pantoprazole (PROTONIX) 20 MG tablet Take 20 mg by mouth 2 (two) times daily.  11/17/12  Yes Historical Provider, MD  ranitidine (ZANTAC) 150 MG tablet Take 150 mg by mouth daily.   Yes Historical Provider, MD  sertraline (ZOLOFT) 50 MG tablet Take 50 mg by mouth at bedtime.  11/17/12  Yes Historical Provider, MD  vitamin B-12 (CYANOCOBALAMIN) 1000 MCG tablet Take 1,000 mcg by mouth daily.   Yes Historical Provider, MD  vitamin C (ASCORBIC ACID) 500 MG tablet Take 500 mg by mouth daily.   Yes Historical Provider, MD    Inpatient Medications:  . aspirin EC  81 mg Oral Daily  . benzonatate  100 mg Oral BID  . carvedilol  3.125 mg Oral BID WC  . enoxaparin (LOVENOX) injection  40 mg Subcutaneous Q24H  . montelukast  10 mg Oral QHS  . multivitamin-lutein  1 capsule Oral Daily  . pantoprazole  40 mg Oral BID  . rosuvastatin  5 mg Oral QHS  . sertraline  50 mg Oral QHS  . ticagrelor  90 mg Oral BID  . vitamin B-12  1,000 mcg Oral Daily  . vitamin C  500 mg Oral Daily      Allergies:  Allergies  Allergen Reactions  . Codeine Nausea And Vomiting and Rash  . Nexium [Esomeprazole Magnesium] Palpitations    . Omeprazole Magnesium Palpitations  . Percocet [Oxycodone-Acetaminophen] Itching, Nausea And Vomiting and Rash    History reviewed. No pertinent family history.   Review of Systems: Review of Systems  Constitutional: Negative.   HENT: Negative.   Respiratory: Negative.   Cardiovascular: Positive for chest pain.  Gastrointestinal: Negative.   Musculoskeletal: Positive for back pain.  Neurological: Negative.   Psychiatric/Behavioral: Negative.   All other systems reviewed and are negative.   Labs:  Recent Labs  10/08/14 1729 10/08/14 2132 10/09/14 0221  TROPONINI <0.03 <0.03 <0.03   Lab Results  Component Value Date   WBC 7.7 10/08/2014   HGB 12.7 10/08/2014   HCT 38.5 10/08/2014   MCV 85.1 10/08/2014   PLT 232 10/08/2014    Recent Labs Lab 10/08/14 1729 10/08/14 2132  NA 141  --   K 4.2  --   CL 109  --   CO2 26  --   BUN 15  --   CREATININE 0.80 0.81  CALCIUM 9.2  --  GLUCOSE 113*  --    No results found for: CHOL, HDL, LDLCALC, TRIG No results found for: DDIMER  Radiology/Studies:  Dg Chest 2 View  10/08/2014   CLINICAL DATA:  Hypertension with back pain. History of prior myocardial infarctions  EXAM: CHEST  2 VIEW  COMPARISON:  June 09, 2013  FINDINGS: There is mild scarring in the lung bases. There is no edema or consolidation. The heart size is normal with pulmonary vascularity within normal limits. No adenopathy. No bone lesions. No pneumothorax.  IMPRESSION: Stable bibasilar lung scarring.  No edema or consolidation.   Electronically Signed   By: Lowella Grip III M.D.   On: 10/08/2014 18:01   Nm Myocar Multi W/spect W/wall Motion / Ef  10/09/2014    There was no ST segment deviation noted during stress.  Blood pressure demonstrated a blunted response to exercise.  No clear evidence of stress-induced myocardial ischemia  Overall ejection fraction of 45% with inferolateral wall hypokinesis  This is considered a low riskHistory  This is a low  risk study.  Nuclear stress EF: 45%.  The left ventricular ejection fraction is mildly decreased (45-54%).     EKG: EKG with normal sinus rhythm, unable to exclude old inferior MI otherwise no significant ST or T-wave changes  Weights: Filed Weights   10/08/14 1723  Weight: 198 lb (89.812 kg)     Physical Exam: Blood pressure 103/68, pulse 60, temperature 98.4 F (36.9 C), temperature source Oral, resp. rate 18, height 5\' 5"  (1.651 m), weight 198 lb (89.812 kg), SpO2 91 %. Body mass index is 32.95 kg/(m^2). General: Well developed, well nourished, in no acute distress, obese Head: Normocephalic, atraumatic, sclera non-icteric, no xanthomas, nares are without discharge.  Neck: Negative for carotid bruits. JVD not elevated. Lungs: Clear bilaterally to auscultation without wheezes, rales, or rhonchi. Breathing is unlabored. Heart: RRR with S1 S2. No murmurs, rubs, or gallops appreciated. Abdomen: Soft, non-tender, non-distended with normoactive bowel sounds. No hepatomegaly. No rebound/guarding. No obvious abdominal masses. Msk:  Strength and tone appear normal for age. Extremities: No clubbing or cyanosis. No edema.  Distal pedal pulses are 2+ and equal bilaterally. Neuro: Alert and oriented X 3. No facial asymmetry. No focal deficit. Moves all extremities spontaneously. Psych:  Responds to questions appropriately with a normal affect.    Assessment and Plan:  54 y.o. female with h/o coronary artery disease, chronic back pain with sciatica, history of ventricular fibrillation and ST elevation myocardial infarction requiring circumflex coronary artery stent in 2013 and unstable angina requiring LAD stent in 2014, recent laminectomy June 2016 per the patient, presenting with back pain between her scapula radiating to her chest.  1.atypical chest pain/upper back pain negative troponin and stress test this morning with no significant ischemia Patient requested to see cardiology based on  her history She does not want cardiac catheterization at this hospital, prefers if this is needed to have this done in [place]. She is concerned about her gallbladder and requesting ultrasound to rule out gallstones --- Would consider current medications. No further cardiac workup at this time. Recommended to her that she call her outpatient cardiologist on Monday and if she continues to have symptoms, perhaps could arrange outpatient cardiac catheterization  2.h/o asthma Stable, no wheezing  3,h/o depression; continue antidepressants   4) hyperlipidemia Well-controlled on Crestor 5 mg daily  5) coronary artery disease Stent placed to her circumflex in 2013, LAD in 2014 Would continue aspirin, brilinta , statin  6) Chronic  back pain H/o of laminectomy in June 2016, chronic back pain Likely contributing to patient's symptoms She sees chiropractic. As an outpatient would recommend she see her  back surgeon, repeat back x-rays or MRI  Signed, Esmond Plants, MD [facility]  10/09/2014, 3:19 PM

## 2014-10-10 ENCOUNTER — Observation Stay: Payer: BC Managed Care – PPO

## 2014-10-10 NOTE — Progress Notes (Signed)
Pt discharged to home via wc.  Instructions  given to pt.  Questions answered.  No distress.  

## 2014-10-11 NOTE — Discharge Summary (Signed)
Brenda Craig, is a 54 y.o. female  DOB Jan 28, 1960  MRN [id number].  Admission date:  10/08/2014  Admitting Physician  Epifanio Lesches, MD  Discharge Date:  10/10/2014   Primary MD  Albina Billet, MD  Recommendations for primary care physician for things to follow:  Follow up with her cardiologist in [place]   Admission Diagnosis  Chest pain [R07.9] Chest pain, unspecified chest pain type [R07.9]   Discharge Diagnosis  Chest pain [R07.9] Chest pain, unspecified chest pain type [R07.9]    Active Problems:   Chest pain   Angina pectoris ([place])   Coronary artery disease involving native coronary artery of native heart with angina pectoris with documented spasm (HCC)   Bilateral thoracic back pain   S/P coronary artery stent placement   Hyperlipidemia      Past Medical History  Diagnosis Date  . Heart trouble   . Celiac disease     Past Surgical History  Procedure Laterality Date  . Heart stents    . Ablation    . Back surgery         History of present illness and  Hospital Course:     Kindly see H&P for history of present illness and admission details, please review complete Labs, Consult reports and Test reports for all details in brief  HPI  from the history and physical done on the day of admission 54 year old female patient admitted secondary to upper back pain, atypical chest pain. Patient has coronary artery disease and 2 stents placed. Concerning her heart history admitted to telemetry to evaluate for acute MI. Patient started on now nitrates, continued on her aspirin, Brillinta, beta blockers.   Hospital Course   #1 atypical chest pain, Back pain: Patient troponins have been negative 3, patient had a Lexiscan stress test which showed low risk scan, EF is around 45%. Because of her continued  low back pain concerning for ACS, patient requested cardiology consult. Patient seen by Dr. Esmond Plants ,He suggested patient to does not need any further cardiac  workup and patient can follow up with her cardiologist at [facility] Patient requested ultrasound for gallbladder to evaluate for any gallstones. Patient had a right upper quadrant ultrasound which is negative for any gallstones. After the ultrasound results came back patient wanted  to go home. Discharged home in stable condition. Patient advised to continue her home medications. 2.upper  upper back pain thought to be secondary to musculoskeletal: Patient had sent laminectomy: Patient will see his chiropractor. 3. Coronary artery disease patient had stents placed in 2013, 2014: Continued on aspirin, statins, Brilinta, statins,  History of depression continued on antidepressants.  History of asthma continue on home medications, patient has no wheezing at this time.   Discharge Condition: stable   Follow UP  Follow-up Information    Follow up with TATE,DENNY C, MD In 1 week.   Specialty:  Internal Medicine   Contact information:   [address]   [place] [address] [phone]       Follow up with pts primary cardiologist  In 1 week.        Discharge Instructions  and  Discharge Medications        Medication List    TAKE these medications        aspirin EC 81 MG tablet  Take 81 mg by mouth daily.     BRILINTA 90 MG Tabs tablet  Generic drug:  ticagrelor  Take 90 mg by  mouth 2 (two) times daily.     carvedilol 3.125 MG tablet  Commonly known as:  COREG  Take 3.125 mg by mouth 2 (two) times daily with a meal.     CRESTOR 5 MG tablet  Generic drug:  rosuvastatin  Take 5 mg by mouth at bedtime.     LORazepam 1 MG tablet  Commonly known as:  ATIVAN  Take 1 mg by mouth every 4 (four) hours as needed for anxiety.     Lutein 20 MG Caps  Take 1 capsule by mouth daily.     montelukast 10 MG tablet   Commonly known as:  SINGULAIR  Take 10 mg by mouth at bedtime.     NITROSTAT 0.4 MG SL tablet  Generic drug:  nitroGLYCERIN  Place 0.4 mg under the tongue every 5 (five) minutes as needed for chest pain.     pantoprazole 20 MG tablet  Commonly known as:  PROTONIX  Take 20 mg by mouth 2 (two) times daily.     ranitidine 150 MG tablet  Commonly known as:  ZANTAC  Take 150 mg by mouth daily.     sertraline 50 MG tablet  Commonly known as:  ZOLOFT  Take 50 mg by mouth at bedtime.     vitamin B-12 1000 MCG tablet  Commonly known as:  CYANOCOBALAMIN  Take 1,000 mcg by mouth daily.     vitamin C 500 MG tablet  Commonly known as:  ASCORBIC ACID  Take 500 mg by mouth daily.          Diet and Activity recommendation: See Discharge Instructions above   Consults obtained - cardiology   Major procedures and Radiology Reports - PLEASE review detailed and final reports for all details, in brief -     Dg Chest 2 View  10/08/2014   CLINICAL DATA:  Hypertension with back pain. History of prior myocardial infarctions  EXAM: CHEST  2 VIEW  COMPARISON:  June 09, 2013  FINDINGS: There is mild scarring in the lung bases. There is no edema or consolidation. The heart size is normal with pulmonary vascularity within normal limits. No adenopathy. No bone lesions. No pneumothorax.  IMPRESSION: Stable bibasilar lung scarring.  No edema or consolidation.   Electronically Signed   By: Lowella Grip III M.D.   On: 10/08/2014 18:01   Nm Myocar Multi W/spect W/wall Motion / Ef  10/09/2014    There was no ST segment deviation noted during stress.  Blood pressure demonstrated a blunted response to exercise.  No clear evidence of stress-induced myocardial ischemia  Overall ejection fraction of 45% with inferolateral wall hypokinesis  This is considered a low riskHistory  This is a low risk study.  Nuclear stress EF: 45%.  The left ventricular ejection fraction is mildly decreased (45-54%).     US Abdomen Limited Ruq  10/10/2014   CLINICAL DATA:  54 year old female with right upper quadrant abdominal pain for 3 days.  EXAM: US ABDOMEN LIMITED - RIGHT UPPER QUADRANT  COMPARISON:  10/03/2011 CT  FINDINGS: Gallbladder:  The gallbladder is unremarkable. There is no evidence of cholelithiasis or acute cholecystitis.  Common bile duct:  Diameter: 4.1 mm. There is no evidence of intrahepatic or extrahepatic biliary dilatation.  Liver:  Mild increased echogenicity of the liver may represent hepatic steatosis. No focal hepatic abnormalities are identified.  A right renal cyst is again noted.  IMPRESSION: Question mild hepatic steatosis.  No evidence of cholelithiasis or acute cholecystitis.  Electronically Signed   By: Margarette Canada M.D.   On: 10/10/2014 09:32    Micro Results   No results found for this or any previous visit (from the past 240 hour(s)).     Today   Subjective:   Alyshia Kernan today has no headache,no chest abdominal pain,no new weakness tingling or numbness, feels much better wants to go home today.   Objective:   Blood pressure 104/79, pulse 61, temperature 97.9 F (36.6 C), temperature source Oral, resp. rate 16, height 5' 5"  (1.651 m), weight 89.812 kg (198 lb), SpO2 96 %.  No intake or output data in the 24 hours ending 10/11/14 1251  Exam Awake Alert, Oriented x 3, No new F.N deficits, Normal affect [employer].AT,PERRAL Supple Neck,No JVD, No cervical lymphadenopathy appriciated.  Symmetrical Chest wall movement, Good air movement bilaterally, CTAB RRR,No Gallops,Rubs or new Murmurs, No Parasternal Heave +ve B.Sounds, Abd Soft, Non tender, No organomegaly appriciated, No rebound -guarding or rigidity. No Cyanosis, Clubbing or edema, No new Rash or bruise  Data Review   CBC w Diff:  Lab Results  Component Value Date   WBC 7.7 10/08/2014   WBC 6.4 06/10/2013   HGB 12.7 10/08/2014   HGB 11.9* 06/10/2013   HCT 38.5 10/08/2014   HCT 36.1 06/10/2013   PLT  232 10/08/2014   PLT 214 06/10/2013   LYMPHOPCT 31.6 06/10/2013   MONOPCT 10.8 06/10/2013   EOSPCT 1.6 06/10/2013   BASOPCT 0.7 06/10/2013    CMP:  Lab Results  Component Value Date   NA 141 10/08/2014   NA 143 06/10/2013   K 4.2 10/08/2014   K 3.7 06/10/2013   CL 109 10/08/2014   CL 109* 06/10/2013   CO2 26 10/08/2014   CO2 29 06/10/2013   BUN 15 10/08/2014   BUN 14 06/10/2013   CREATININE 0.81 10/08/2014   CREATININE 0.87 06/10/2013   PROT 7.1 05/25/2011   ALBUMIN 3.5 05/25/2011   BILITOT 0.2 05/25/2011   ALKPHOS 82 05/25/2011   AST 17 05/25/2011   ALT 16 05/25/2011  .   Total Time in preparing paper work, data evaluation and todays exam - 65 minutes  Myrna Vonseggern M.D on 10/10/2014 at 12:51 PM

## 2014-12-07 LAB — HM MAMMOGRAPHY

## 2014-12-23 ENCOUNTER — Other Ambulatory Visit: Payer: Self-pay | Admitting: Physician Assistant

## 2014-12-23 DIAGNOSIS — M79605 Pain in left leg: Secondary | ICD-10-CM

## 2014-12-24 ENCOUNTER — Other Ambulatory Visit: Payer: Self-pay | Admitting: Physician Assistant

## 2014-12-24 DIAGNOSIS — M79605 Pain in left leg: Secondary | ICD-10-CM

## 2015-01-05 ENCOUNTER — Other Ambulatory Visit: Payer: BC Managed Care – PPO

## 2015-01-13 ENCOUNTER — Ambulatory Visit: Payer: BC Managed Care – PPO

## 2015-01-18 ENCOUNTER — Other Ambulatory Visit: Payer: BC Managed Care – PPO

## 2015-01-25 ENCOUNTER — Ambulatory Visit
Admission: RE | Admit: 2015-01-25 | Discharge: 2015-01-25 | Disposition: A | Discharge status: Home / Self Care | Payer: BC Managed Care – PPO | Source: Ambulatory Visit | Attending: Physician Assistant | Admitting: Physician Assistant

## 2015-01-25 DIAGNOSIS — M79605 Pain in left leg: Secondary | ICD-10-CM

## 2015-01-25 MED ORDER — GADOBENATE DIMEGLUMINE 529 MG/ML IV SOLN
19.0000 mL | Freq: Once | INTRAVENOUS | Status: AC | PRN
  Administered 2015-01-25: 19 mL via INTRAVENOUS

## 2015-02-03 ENCOUNTER — Ambulatory Visit
Admission: RE | Admit: 2015-02-03 | Discharge: 2015-02-03 | Disposition: A | Discharge status: Home / Self Care | Payer: BC Managed Care – PPO | Source: Ambulatory Visit | Attending: Internal Medicine | Admitting: Internal Medicine

## 2015-02-03 ENCOUNTER — Other Ambulatory Visit: Payer: Self-pay | Admitting: Internal Medicine

## 2015-02-03 DIAGNOSIS — M25572 Pain in left ankle and joints of left foot: Secondary | ICD-10-CM

## 2015-02-14 DIAGNOSIS — M5126 Other intervertebral disc displacement, lumbar region: Secondary | ICD-10-CM | POA: Insufficient documentation

## 2015-03-28 ENCOUNTER — Encounter: Payer: Self-pay | Admitting: Podiatry

## 2015-03-28 ENCOUNTER — Ambulatory Visit (INDEPENDENT_AMBULATORY_CARE_PROVIDER_SITE_OTHER): Payer: BC Managed Care – PPO

## 2015-03-28 ENCOUNTER — Ambulatory Visit (INDEPENDENT_AMBULATORY_CARE_PROVIDER_SITE_OTHER): Payer: BC Managed Care – PPO | Admitting: Podiatry

## 2015-03-28 VITALS — BP 103/72 | HR 79 | Resp 16

## 2015-03-28 DIAGNOSIS — G5752 Tarsal tunnel syndrome, left lower limb: Secondary | ICD-10-CM

## 2015-03-28 DIAGNOSIS — N200 Calculus of kidney: Secondary | ICD-10-CM | POA: Insufficient documentation

## 2015-03-28 DIAGNOSIS — R053 Chronic cough: Secondary | ICD-10-CM | POA: Insufficient documentation

## 2015-03-28 DIAGNOSIS — N281 Cyst of kidney, acquired: Secondary | ICD-10-CM | POA: Insufficient documentation

## 2015-03-28 DIAGNOSIS — K635 Polyp of colon: Secondary | ICD-10-CM | POA: Insufficient documentation

## 2015-03-28 DIAGNOSIS — M79672 Pain in left foot: Secondary | ICD-10-CM

## 2015-03-28 DIAGNOSIS — K269 Duodenal ulcer, unspecified as acute or chronic, without hemorrhage or perforation: Secondary | ICD-10-CM | POA: Insufficient documentation

## 2015-03-28 DIAGNOSIS — R05 Cough: Secondary | ICD-10-CM | POA: Insufficient documentation

## 2015-03-28 DIAGNOSIS — K589 Irritable bowel syndrome without diarrhea: Secondary | ICD-10-CM | POA: Insufficient documentation

## 2015-03-28 DIAGNOSIS — K649 Unspecified hemorrhoids: Secondary | ICD-10-CM | POA: Insufficient documentation

## 2015-03-28 DIAGNOSIS — K449 Diaphragmatic hernia without obstruction or gangrene: Secondary | ICD-10-CM | POA: Insufficient documentation

## 2015-03-29 NOTE — Progress Notes (Signed)
She presents today with chief complaint of pain to her left foot and ankle she states that the pain radiates from the medial ankle extending to the toes and up the back of the calf. She relates that over the past couple of years she has had back surgery 2 most recent 6 months ago. She states some of the pain in the foot has stopped since surgery but she still experiences pain down the back of the leg and into the foot. However her neurosurgeon wanted [place] to take a look at her foot to make sure that she did not have any problems down here. I have reviewed her past medical history medications and allergies she denies any changes in her past history medications allergies and surgeries other than the aforementioned back surgery.  Objective: Vital signs stable alert and oriented 3. Pulses are strongly palpable bilateral. She has normal neurologic sensorium per Semmes-Weinstein monofilament however she does have a positive Tinel sign. The majority of her symptoms light just distal to the flexor retinaculum at the deltoid ligament. Her symptoms lie more at the porta pedis. Radiographs do not demonstrate any type of major acetabular maladies in this area. No open lesions or wounds.  Assessment: Possible tarsal tunnel left foot.  Plan: I injected the area today with Kenalog and local anesthetic. We may need to consider getting her into a pair of orthotics next visit.

## 2015-05-04 ENCOUNTER — Ambulatory Visit: Payer: BC Managed Care – PPO | Admitting: Podiatry

## 2015-06-01 ENCOUNTER — Other Ambulatory Visit: Payer: Self-pay | Admitting: Internal Medicine

## 2015-06-01 DIAGNOSIS — R109 Unspecified abdominal pain: Secondary | ICD-10-CM

## 2015-06-09 ENCOUNTER — Ambulatory Visit: Admission: RE | Admit: 2015-06-09 | Payer: BC Managed Care – PPO | Source: Ambulatory Visit

## 2015-06-20 ENCOUNTER — Other Ambulatory Visit: Payer: Self-pay | Admitting: Physician Assistant

## 2015-06-20 DIAGNOSIS — M79606 Pain in leg, unspecified: Secondary | ICD-10-CM

## 2015-06-20 DIAGNOSIS — M545 Low back pain: Secondary | ICD-10-CM

## 2015-06-21 ENCOUNTER — Ambulatory Visit: Payer: BC Managed Care – PPO

## 2015-07-07 ENCOUNTER — Other Ambulatory Visit
Admission: RE | Admit: 2015-07-07 | Discharge: 2015-07-07 | Disposition: A | Discharge status: Home / Self Care | Payer: BC Managed Care – PPO | Source: Ambulatory Visit | Attending: Physician Assistant | Admitting: Physician Assistant

## 2015-07-07 ENCOUNTER — Ambulatory Visit
Admission: RE | Admit: 2015-07-07 | Discharge: 2015-07-07 | Disposition: A | Discharge status: Home / Self Care | Payer: BC Managed Care – PPO | Source: Ambulatory Visit | Attending: Physician Assistant | Admitting: Physician Assistant

## 2015-07-07 DIAGNOSIS — M545 Low back pain: Secondary | ICD-10-CM

## 2015-07-07 DIAGNOSIS — M539 Dorsopathy, unspecified: Secondary | ICD-10-CM | POA: Insufficient documentation

## 2015-07-07 DIAGNOSIS — M79606 Pain in leg, unspecified: Secondary | ICD-10-CM

## 2015-07-07 LAB — CREATININE, SERUM
Creatinine, Ser: 0.77 mg/dL (ref 0.44–1.00)
GFR calc Af Amer: >60 mL/min (ref >60)
GFR calc non Af Amer: >60 mL/min (ref >60)

## 2015-07-13 ENCOUNTER — Ambulatory Visit
Admission: RE | Admit: 2015-07-13 | Discharge: 2015-07-13 | Disposition: A | Discharge status: Home / Self Care | Payer: BC Managed Care – PPO | Source: Ambulatory Visit | Attending: Physician Assistant | Admitting: Physician Assistant

## 2015-07-13 DIAGNOSIS — G9619 Other disorders of meninges, not elsewhere classified: Secondary | ICD-10-CM | POA: Diagnosis not present

## 2015-07-13 DIAGNOSIS — M545 Low back pain: Secondary | ICD-10-CM | POA: Diagnosis present

## 2015-07-13 DIAGNOSIS — M79609 Pain in unspecified limb: Secondary | ICD-10-CM | POA: Diagnosis present

## 2015-07-13 DIAGNOSIS — Z9889 Other specified postprocedural states: Secondary | ICD-10-CM | POA: Diagnosis not present

## 2015-07-13 MED ORDER — GADOBENATE DIMEGLUMINE 529 MG/ML IV SOLN
20.0000 mL | Freq: Once | INTRAVENOUS | Status: AC | PRN
  Administered 2015-07-13: 18 mL via INTRAVENOUS

## 2015-08-22 ENCOUNTER — Encounter: Payer: Self-pay | Admitting: Emergency Medicine

## 2015-08-22 ENCOUNTER — Emergency Department: Payer: BC Managed Care – PPO

## 2015-08-22 ENCOUNTER — Observation Stay
Admission: EM | Admit: 2015-08-22 | Discharge: 2015-08-23 | Disposition: A | Discharge status: Home / Self Care | Payer: BC Managed Care – PPO | Attending: Internal Medicine | Admitting: Internal Medicine

## 2015-08-22 DIAGNOSIS — Z885 Allergy status to narcotic agent status: Secondary | ICD-10-CM | POA: Diagnosis not present

## 2015-08-22 DIAGNOSIS — R55 Syncope and collapse: Secondary | ICD-10-CM | POA: Diagnosis present

## 2015-08-22 DIAGNOSIS — I509 Heart failure, unspecified: Secondary | ICD-10-CM | POA: Insufficient documentation

## 2015-08-22 DIAGNOSIS — R197 Diarrhea, unspecified: Secondary | ICD-10-CM | POA: Diagnosis not present

## 2015-08-22 DIAGNOSIS — Z79899 Other long term (current) drug therapy: Secondary | ICD-10-CM | POA: Insufficient documentation

## 2015-08-22 DIAGNOSIS — Z7982 Long term (current) use of aspirin: Secondary | ICD-10-CM | POA: Insufficient documentation

## 2015-08-22 DIAGNOSIS — I2510 Atherosclerotic heart disease of native coronary artery without angina pectoris: Secondary | ICD-10-CM

## 2015-08-22 DIAGNOSIS — M5126 Other intervertebral disc displacement, lumbar region: Secondary | ICD-10-CM | POA: Diagnosis not present

## 2015-08-22 DIAGNOSIS — K589 Irritable bowel syndrome without diarrhea: Secondary | ICD-10-CM | POA: Insufficient documentation

## 2015-08-22 DIAGNOSIS — K269 Duodenal ulcer, unspecified as acute or chronic, without hemorrhage or perforation: Secondary | ICD-10-CM | POA: Insufficient documentation

## 2015-08-22 DIAGNOSIS — K529 Noninfective gastroenteritis and colitis, unspecified: Secondary | ICD-10-CM | POA: Diagnosis not present

## 2015-08-22 DIAGNOSIS — R110 Nausea: Secondary | ICD-10-CM

## 2015-08-22 DIAGNOSIS — J45909 Unspecified asthma, uncomplicated: Secondary | ICD-10-CM | POA: Diagnosis not present

## 2015-08-22 DIAGNOSIS — I110 Hypertensive heart disease with heart failure: Secondary | ICD-10-CM | POA: Diagnosis not present

## 2015-08-22 DIAGNOSIS — E7800 Pure hypercholesterolemia, unspecified: Secondary | ICD-10-CM | POA: Diagnosis not present

## 2015-08-22 DIAGNOSIS — F419 Anxiety disorder, unspecified: Secondary | ICD-10-CM | POA: Diagnosis present

## 2015-08-22 DIAGNOSIS — Z955 Presence of coronary angioplasty implant and graft: Secondary | ICD-10-CM

## 2015-08-22 DIAGNOSIS — Z888 Allergy status to other drugs, medicaments and biological substances status: Secondary | ICD-10-CM | POA: Insufficient documentation

## 2015-08-22 DIAGNOSIS — M549 Dorsalgia, unspecified: Secondary | ICD-10-CM | POA: Diagnosis present

## 2015-08-22 DIAGNOSIS — E785 Hyperlipidemia, unspecified: Secondary | ICD-10-CM | POA: Diagnosis present

## 2015-08-22 DIAGNOSIS — Z87891 Personal history of nicotine dependence: Secondary | ICD-10-CM | POA: Insufficient documentation

## 2015-08-22 DIAGNOSIS — Z8249 Family history of ischemic heart disease and other diseases of the circulatory system: Secondary | ICD-10-CM | POA: Insufficient documentation

## 2015-08-22 DIAGNOSIS — K449 Diaphragmatic hernia without obstruction or gangrene: Secondary | ICD-10-CM | POA: Diagnosis not present

## 2015-08-22 DIAGNOSIS — N281 Cyst of kidney, acquired: Secondary | ICD-10-CM | POA: Diagnosis not present

## 2015-08-22 DIAGNOSIS — I252 Old myocardial infarction: Secondary | ICD-10-CM | POA: Insufficient documentation

## 2015-08-22 DIAGNOSIS — K219 Gastro-esophageal reflux disease without esophagitis: Secondary | ICD-10-CM | POA: Insufficient documentation

## 2015-08-22 DIAGNOSIS — K900 Celiac disease: Secondary | ICD-10-CM | POA: Diagnosis present

## 2015-08-22 DIAGNOSIS — F329 Major depressive disorder, single episode, unspecified: Secondary | ICD-10-CM | POA: Diagnosis not present

## 2015-08-22 DIAGNOSIS — G8929 Other chronic pain: Secondary | ICD-10-CM | POA: Diagnosis not present

## 2015-08-22 DIAGNOSIS — I25111 Atherosclerotic heart disease of native coronary artery with angina pectoris with documented spasm: Principal | ICD-10-CM | POA: Insufficient documentation

## 2015-08-22 DIAGNOSIS — N200 Calculus of kidney: Secondary | ICD-10-CM | POA: Diagnosis not present

## 2015-08-22 DIAGNOSIS — M546 Pain in thoracic spine: Secondary | ICD-10-CM

## 2015-08-22 HISTORY — DX: Heart failure, unspecified: I50.9

## 2015-08-22 HISTORY — DX: Essential (primary) hypertension: I10

## 2015-08-22 HISTORY — DX: Atherosclerotic heart disease of native coronary artery without angina pectoris: I25.10

## 2015-08-22 HISTORY — DX: Unspecified asthma, uncomplicated: J45.909

## 2015-08-22 HISTORY — DX: Pure hypercholesterolemia, unspecified: E78.00

## 2015-08-22 LAB — LIPID PANEL
Cholesterol: 106 mg/dL (ref 0–200)
HDL: 42 mg/dL (ref >40)
LDL Cholesterol: 43 mg/dL (ref 0–99)
Total CHOL/HDL Ratio: 2.5 RATIO
Triglycerides: 107 mg/dL (ref <150)
VLDL: 21 mg/dL (ref 0–40)

## 2015-08-22 LAB — CBC
HCT: 38.6 % (ref 35.0–47.0)
HCT: 38.7 % (ref 35.0–47.0)
Hemoglobin: 13.2 g/dL (ref 12.0–16.0)
Hemoglobin: 13 g/dL (ref 12.0–16.0)
MCH: 28.9 pg (ref 26.0–34.0)
MCH: 28.6 pg (ref 26.0–34.0)
MCHC: 34.3 g/dL (ref 32.0–36.0)
MCHC: 33.5 g/dL (ref 32.0–36.0)
MCV: 84.2 fL (ref 80.0–100.0)
MCV: 85.4 fL (ref 80.0–100.0)
Platelets: 278 10*3/uL (ref 150–440)
Platelets: 295 10*3/uL (ref 150–440)
RBC: 4.59 MIL/uL (ref 3.80–5.20)
RBC: 4.53 MIL/uL (ref 3.80–5.20)
RDW: 14.9 % — ABNORMAL HIGH (ref 11.5–14.5)
RDW: 14.6 % — ABNORMAL HIGH (ref 11.5–14.5)
WBC: 8.2 10*3/uL (ref 3.6–11.0)
WBC: 6.8 10*3/uL (ref 3.6–11.0)

## 2015-08-22 LAB — TROPONIN I
Troponin I: <0.03 ng/mL (ref <0.03)
Troponin I: <0.03 ng/mL (ref <0.03)
Troponin I: <0.03 ng/mL (ref <0.03)
Troponin I: <0.03 ng/mL (ref <0.03)

## 2015-08-22 LAB — BASIC METABOLIC PANEL
Anion gap: 8 (ref 5–15)
BUN: 22 mg/dL — ABNORMAL HIGH (ref 6–20)
CO2: 24 mmol/L (ref 22–32)
Calcium: 9.3 mg/dL (ref 8.9–10.3)
Chloride: 107 mmol/L (ref 101–111)
Creatinine, Ser: 0.74 mg/dL (ref 0.44–1.00)
GFR calc Af Amer: >60 mL/min (ref >60)
GFR calc non Af Amer: >60 mL/min (ref >60)
Glucose, Bld: 113 mg/dL — ABNORMAL HIGH (ref 65–99)
Potassium: 3.6 mmol/L (ref 3.5–5.1)
Sodium: 139 mmol/L (ref 135–145)

## 2015-08-22 LAB — CREATININE, SERUM
Creatinine, Ser: 0.75 mg/dL (ref 0.44–1.00)
GFR calc Af Amer: >60 mL/min (ref >60)
GFR calc non Af Amer: >60 mL/min (ref >60)

## 2015-08-22 MED ORDER — LUTEIN 20 MG PO CAPS: 1.0000 | ORAL_CAPSULE | Freq: Every day | ORAL | Status: DC

## 2015-08-22 MED ORDER — LORAZEPAM 1 MG PO TABS
1.0000 mg | ORAL_TABLET | ORAL | Status: DC | PRN
  Administered 2015-08-22: 1 mg via ORAL
  Filled 2015-08-22: qty 1

## 2015-08-22 MED ORDER — SERTRALINE HCL 50 MG PO TABS: 50.0000 mg | ORAL_TABLET | Freq: Every day | ORAL | Status: DC

## 2015-08-22 MED ORDER — ASPIRIN EC 81 MG PO TBEC
81.0000 mg | DELAYED_RELEASE_TABLET | Freq: Every day | ORAL | Status: DC
  Administered 2015-08-23: 81 mg via ORAL
  Filled 2015-08-22: qty 1

## 2015-08-22 MED ORDER — TICAGRELOR 60 MG PO TABS
60.0000 mg | ORAL_TABLET | Freq: Two times a day (BID) | ORAL | Status: DC
  Administered 2015-08-22 – 2015-08-23 (×3): 60 mg via ORAL
  Filled 2015-08-22 (×4): qty 1

## 2015-08-22 MED ORDER — NITROGLYCERIN 2 % TD OINT
1.0000 [in_us] | TOPICAL_OINTMENT | Freq: Once | TRANSDERMAL | Status: AC
  Administered 2015-08-22: 1 [in_us] via TOPICAL
  Filled 2015-08-22: qty 1

## 2015-08-22 MED ORDER — SODIUM CHLORIDE 0.9% FLUSH
3.0000 mL | Freq: Two times a day (BID) | INTRAVENOUS | Status: DC
  Administered 2015-08-22 – 2015-08-23 (×3): 3 mL via INTRAVENOUS

## 2015-08-22 MED ORDER — VITAMIN C 500 MG PO TABS
500.0000 mg | ORAL_TABLET | Freq: Every day | ORAL | Status: DC
Start: 2015-08-22 — End: 2015-08-23
  Administered 2015-08-22 – 2015-08-23 (×2): 500 mg via ORAL
  Filled 2015-08-22 (×2): qty 1

## 2015-08-22 MED ORDER — ROSUVASTATIN CALCIUM 10 MG PO TABS
10.0000 mg | ORAL_TABLET | Freq: Every day | ORAL | Status: DC
  Administered 2015-08-22: 10 mg via ORAL
  Filled 2015-08-22: qty 1

## 2015-08-22 MED ORDER — MONTELUKAST SODIUM 10 MG PO TABS
10.0000 mg | ORAL_TABLET | Freq: Every day | ORAL | Status: DC
  Administered 2015-08-22: 10 mg via ORAL
  Filled 2015-08-22: qty 1

## 2015-08-22 MED ORDER — CARVEDILOL 3.125 MG PO TABS
3.1250 mg | ORAL_TABLET | Freq: Two times a day (BID) | ORAL | Status: DC
  Filled 2015-08-22: qty 1

## 2015-08-22 MED ORDER — VITAMIN B-12 1000 MCG PO TABS
1000.0000 ug | ORAL_TABLET | Freq: Every day | ORAL | Status: DC
  Administered 2015-08-22 – 2015-08-23 (×2): 1000 ug via ORAL
  Filled 2015-08-22 (×2): qty 1

## 2015-08-22 MED ORDER — IOPAMIDOL (ISOVUE-370) INJECTION 76%
75.0000 mL | Freq: Once | INTRAVENOUS | Status: AC | PRN
  Administered 2015-08-22: 75 mL via INTRAVENOUS

## 2015-08-22 MED ORDER — ASPIRIN 81 MG PO CHEW
324.0000 mg | CHEWABLE_TABLET | Freq: Once | ORAL | Status: AC
  Administered 2015-08-22: 324 mg via ORAL
  Filled 2015-08-22: qty 4

## 2015-08-22 MED ORDER — ONDANSETRON HCL 4 MG PO TABS: 4.0000 mg | ORAL_TABLET | Freq: Three times a day (TID) | ORAL | Status: DC | PRN

## 2015-08-22 MED ORDER — HEPARIN SODIUM (PORCINE) 5000 UNIT/ML IJ SOLN
5000.0000 [IU] | Freq: Three times a day (TID) | INTRAMUSCULAR | Status: DC
  Administered 2015-08-22 – 2015-08-23 (×3): 5000 [IU] via SUBCUTANEOUS
  Filled 2015-08-22 (×3): qty 1

## 2015-08-22 MED ORDER — BENZONATATE 100 MG PO CAPS
100.0000 mg | ORAL_CAPSULE | Freq: Three times a day (TID) | ORAL | Status: DC
  Filled 2015-08-22 (×2): qty 1

## 2015-08-22 MED ORDER — HYDROCODONE-ACETAMINOPHEN 5-325 MG PO TABS
1.0000 | ORAL_TABLET | Freq: Four times a day (QID) | ORAL | Status: DC | PRN
  Administered 2015-08-22 – 2015-08-23 (×4): 1 via ORAL
  Filled 2015-08-22 (×4): qty 1

## 2015-08-22 MED ORDER — NITROGLYCERIN 0.4 MG SL SUBL: 0.4000 mg | SUBLINGUAL_TABLET | SUBLINGUAL | Status: DC | PRN

## 2015-08-22 MED ORDER — DIAZEPAM 5 MG PO TABS
5.0000 mg | ORAL_TABLET | Freq: Three times a day (TID) | ORAL | Status: DC | PRN
  Administered 2015-08-22 – 2015-08-23 (×2): 5 mg via ORAL
  Filled 2015-08-22 (×2): qty 1

## 2015-08-22 MED ORDER — ISOSORBIDE MONONITRATE ER 30 MG PO TB24
15.0000 mg | ORAL_TABLET | Freq: Every day | ORAL | Status: DC
Start: 2015-08-22 — End: 2015-08-23
  Administered 2015-08-22 – 2015-08-23 (×2): 15 mg via ORAL
  Filled 2015-08-22 (×2): qty 1

## 2015-08-22 MED ORDER — PANTOPRAZOLE SODIUM 40 MG PO TBEC
40.0000 mg | DELAYED_RELEASE_TABLET | Freq: Two times a day (BID) | ORAL | Status: DC
  Administered 2015-08-22 – 2015-08-23 (×3): 40 mg via ORAL
  Filled 2015-08-22 (×4): qty 1
  Filled 2015-08-22: qty 2

## 2015-08-22 NOTE — ED Notes (Signed)
Pt was given 324 mg aspirin by EMS. Pt took 2 nitroglycerins for chest pain before coming to ED.

## 2015-08-22 NOTE — ED Provider Notes (Signed)
[facility] Emergency Department Provider Note   ____________________________________________   First MD Initiated Contact with Patient 08/22/15 [phone]     (approximate)  I have reviewed the triage vital signs and the nursing notes.   HISTORY  Chief Complaint Chest Pain    HPI Brenda Craig is a 55 y.o. female who comes into the hospital today with back pain. The patient reports that she had back surgery 2 weeks ago and yesterday started having symptoms of what she thought was a stomach bug. The patient was having sweats, nausea, presyncopal symptoms and back pain. The patient also had more than 6 episodes of diarrhea. She reports that she was getting worse throughout today and feeling overly warm. The patient reports this evening she felt that way again so she knew her blood pressure was elevated. She couldn't sit up straight. Her blood pressure was 161/116. She was told that given her history she should take nitroglycerin which she did 2 but her blood pressure did not improve so she called 911. The patient reports that she coded in the past and never had chest pain only back pain. The patient reports that the pain is in her upper back not where she had her surgery. The patient reports that she's had some occasional twinges in her chest. She denies radiation of her pain but she's had some jaw pain on and off. The patient has had sweats and shortness of breath as well as some nausea. The patient rates her pain currently an 8 out of 10 in intensity. The patient's last catheterization was in October and her doctor is at [facility]. She is here for evaluation.   Past Medical History:  Diagnosis Date  . Acid reflux 01/17/2013  . Asthma   . Celiac disease   . CHF (congestive heart failure) ([place])   . Coronary artery disease   . Heart trouble   . Hypercholesteremia   . Hypertension     Patient Active Problem List   Diagnosis Date Noted  . Chronic cough  03/28/2015  . Colon polyp 03/28/2015  . DU (duodenal ulcer) 03/28/2015  . Hemorrhoid 03/28/2015  . 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 Calculus of kidney 03/28/2015  . Kidney cysts 03/28/2015  . Displacement of lumbar intervertebral disc without myelopathy 02/14/2015  . Angina pectoris ([place])   . Coronary artery disease involving native coronary artery of native heart with angina pectoris with documented spasm ([place])   . Bilateral thoracic back pain   . S/P coronary artery stent placement   . Hyperlipidemia   . Chest pain 10/08/2014  . Encounter for preprocedural cardiovascular examination 05/30/2014  . Excess weight 08/11/2013  . Celiac disease 07/22/2013  . Momentary blindness 05/25/2013  . Acid reflux 01/17/2013  . Airway hyperreactivity 01/17/2013  . Anxiety 01/17/2013  . Arteriosclerosis of coronary artery 01/17/2013  . HLD (hyperlipidemia) 01/17/2013  . BP (high blood pressure) 01/17/2013  . Atherosclerosis of coronary artery 10/31/2012  . Essential (primary) hypertension 10/31/2012  . Hypercholesterolemia 10/31/2012  . Presence of stent in anterior descending branch of left coronary artery 10/31/2012  . Presence of stent in coronary artery 10/31/2012    Past Surgical History:  Procedure Laterality Date  . ABLATION    . BACK SURGERY    . HEART STENTS    . OOPHORECTOMY Right     Prior to Admission medications   Medication Sig Start Date End Date Taking? Authorizing Provider  aspirin EC 81  MG tablet Take 81 mg by mouth daily.   Yes Historical Provider, MD  carvedilol (COREG) 3.125 MG tablet Take 3.125 mg by mouth 2 (two) times daily with a meal.  01/17/13  Yes Historical Provider, MD  HYDROcodone-acetaminophen (NORCO/VICODIN) 5-325 MG tablet Take 1 tablet by mouth every 8 (eight) hours as needed. 03/09/15  Yes Historical Provider, MD  isosorbide mononitrate (IMDUR) 30 MG 24 hr tablet 15 mg.  03/26/15  Yes Historical Provider, MD  LORazepam  (ATIVAN) 1 MG tablet Take 1 mg by mouth every 4 (four) hours as needed for anxiety.  11/14/12  Yes Historical Provider, MD  montelukast (SINGULAIR) 10 MG tablet Take 10 mg by mouth at bedtime.   Yes Historical Provider, MD  NITROSTAT 0.4 MG SL tablet Place 0.4 mg under the tongue every 5 (five) minutes as needed for chest pain.  11/03/12  Yes Historical Provider, MD  pantoprazole (PROTONIX) 20 MG tablet Take 20 mg by mouth 2 (two) times daily.  11/17/12  Yes Historical Provider, MD  ranitidine (ZANTAC) 150 MG tablet Take 150 mg by mouth daily.   Yes Historical Provider, MD  rosuvastatin (CRESTOR) 10 MG tablet Take 10 mg by mouth at bedtime.  10/16/12  Yes Historical Provider, MD  ticagrelor (BRILINTA) 60 MG TABS tablet Take 60 mg by mouth 2 (two) times daily.  11/06/12  Yes Historical Provider, MD  benzonatate (TESSALON) 100 MG capsule Take 100 mg by mouth 3 (three) times daily. 03/22/15   Historical Provider, MD  cefdinir (OMNICEF) 300 MG capsule TAKE 2 CAPSULES BY MOUTH DAILY FOR 10 DAYS 03/22/15   Historical Provider, MD  diazepam (VALIUM) 5 MG tablet TAKE ONE TABLET BY MOUTH EVERY 8 HOURS AS NEEDED FOR MUSCLE SPASMS 02/15/15   Historical Provider, MD  fluconazole (DIFLUCAN) 150 MG tablet TAKE 1 TABLET BY MOUTH NOW AND MAY REPEAT IN 1 WEEK IF NEEDED 03/09/15   Historical Provider, MD  Lutein 20 MG CAPS Take 1 capsule by mouth daily.    Historical Provider, MD  ondansetron (ZOFRAN) 8 MG tablet TAKE 1 TABLET EVERY 8 HOURS AS NEEDED FOR NAUSEA 02/05/15   Historical Provider, MD  sertraline (ZOLOFT) 50 MG tablet Take 50 mg by mouth at bedtime.  11/17/12   Historical Provider, MD  traMADol (ULTRAM) 50 MG tablet TAKE 1 TABLET BY MOUTH EVERY 6 HOURS AS NEEDED FOR BACK PAIN 01/19/15   Historical Provider, MD  vitamin B-12 (CYANOCOBALAMIN) 1000 MCG tablet Take 1,000 mcg by mouth daily.    Historical Provider, MD  vitamin C (ASCORBIC ACID) 500 MG tablet Take 500 mg by mouth daily.    Historical Provider, MD     Allergies Codeine; Nexium [esomeprazole magnesium]; Omeprazole magnesium; and Percocet [oxycodone-acetaminophen]  No family history on file.  Social History Social History  Substance Use Topics  . Smoking status: Never Smoker  . Smokeless tobacco: Never Used  . Alcohol use Yes     Comment: OCCASIONALLY    Review of Systems Constitutional: Sweats Eyes: No visual changes. ENT: No sore throat. Cardiovascular:  chest pain. Respiratory:  shortness of breath. Gastrointestinal: nausea, diarrhea.  No constipation. Genitourinary: Negative for dysuria. Musculoskeletal: back pain. Skin: Negative for rash. Neurological: Lightheadedness  10-point ROS otherwise negative.  ____________________________________________   PHYSICAL EXAM:  VITAL SIGNS: ED Triage Vitals  Enc Vitals Group     BP 08/22/15 0521 95/77     Pulse Rate 08/22/15 0518 84     Resp 08/22/15 0518 18     Temp  08/22/15 0518 98.6 F (37 C)     Temp Source 08/22/15 0518 Oral     SpO2 08/22/15 0518 95 %     Weight 08/22/15 0518 195 lb (88.5 kg)     Height 08/22/15 0518 5' 5.5" (1.664 m)     Head Circumference --      Peak Flow --      Pain Score 08/22/15 0520 7     Pain Loc --      Pain Edu? --      Excl. in [place]? --     Constitutional: Alert and oriented. Well appearing and in moderate distress. Eyes: Conjunctivae are normal. PERRL. EOMI. Head: Atraumatic. Nose: No congestion/rhinnorhea. Mouth/Throat: Mucous membranes are moist.  Oropharynx non-erythematous. Cardiovascular: Normal rate, regular rhythm. Grossly normal heart sounds.  Good peripheral circulation. Respiratory: Normal respiratory effort.  No retractions. Lungs CTAB. Gastrointestinal: Soft and nontender. No distention. Positive bowel sounds Musculoskeletal: No lower extremity tenderness nor edema.   Neurologic:  Normal speech and language.  Skin:  Skin is warm, dry and intact.  Psychiatric: Mood and affect are normal.    ____________________________________________   LABS (all labs ordered are listed, but only abnormal results are displayed)  Labs Reviewed  BASIC METABOLIC PANEL - Abnormal; Notable for the following:       Result Value   Glucose, Bld 113 (*)    BUN 22 (*)    All other components within normal limits  CBC - Abnormal; Notable for the following:    RDW 14.9 (*)    All other components within normal limits  TROPONIN I   ____________________________________________  EKG  ED ECG REPORT I, Loney Hering, the attending physician, personally viewed and interpreted this ECG.   Date: 08/22/2015  EKG Time: 521  Rate: 85  Rhythm: normal sinus rhythm  Axis: normal  Intervals:none  ST&T Change: none  ____________________________________________  RADIOLOGY  CXR ____________________________________________   PROCEDURES  Procedure(s) performed: None  Procedures  Critical Care performed: No  ____________________________________________   INITIAL IMPRESSION / ASSESSMENT AND PLAN / ED COURSE  Pertinent labs & imaging results that were available during my care of the patient were reviewed by me and considered in my medical decision making (see chart for details).  This is a 55 year old female who comes into the hospital today with back pain. Patient reports that she's had similar symptoms with stents and with coating in the past. We will check some blood work as well as a chest x-ray. I'll place a Nitropaste to the patient's chest and reassess the patient. Her blood pressure is improved at this time.  Clinical Course  Value Comment By Time  DG Chest 2 View Scattered bibasilar atelectatic changes. No other active cardiopulmonary disease identified Loney Hering, MD 08/14 [phone]    The patient's initial blood work is unremarkable. Given the patient's history of cardiac arrest I will perform a CT angios of the patient's chest looking for possible dissection or PE.  The patient's care will be signed out to Dr. Clearnce Hasten who will follow-up the results of the CT scan. At that time the patient will be admitted for observation to the hospital. She does have a Nitropaste to her chest. ____________________________________________   FINAL CLINICAL IMPRESSION(S) / ED DIAGNOSES  Final diagnoses:  Back pain  Nausea  Pre-syncope  Diarrhea, unspecified type      NEW MEDICATIONS STARTED DURING THIS VISIT:  New Prescriptions   No medications on file  Note:  This document was prepared using Dragon voice recognition software and may include unintentional dictation errors.    Loney Hering, MD 08/22/15 [phone]

## 2015-08-22 NOTE — ED Triage Notes (Signed)
Patient to Rm 26 via EMS from home for non radiating chest pain.

## 2015-08-22 NOTE — ED Notes (Addendum)
Pt c/o of central/left chest pain beginning at approx 0400 that radiated to jaw and back. Pt had accompanying symptoms of weakness/lightheadedness/dizziness/diaphoresis/SOB/nausea. Pt c/o of back pain and nausea beginning Monday. Pt c/o intermittent jaw pain beginning August 1st, after pt's back surgery. Pt has had previous MI, multiple stent placements, and 3 cardiac cath. 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 Pt reports her cardiac pain normally presents in back. Pt took 2 nitroglycerins for today's episode of chest pain. Chest pain has since resolved, however pt still c/o of nausea and back pain

## 2015-08-22 NOTE — ED Provider Notes (Addendum)
55 year old female with a history of coronary artery disease was presenting with thoracic back pain. Has a history of stents. Says that she has never had pain with her heart attacks or chest pain as well as had back pain. Says that her pain is now down to 6 out of 10 with Nitropaste. Senna from Dr. Dahlia Client was to reassess as well as follow-up with the CT angiography and likely admit  Physical Exam  BP 128/90   Pulse 80   Temp 98.6 F (37 C) (Oral)   Resp (!) 21   Ht 5' 5.5" (1.664 m)   Wt 195 lb (88.5 kg)   SpO2 95%   BMI 31.96 kg/m  ----------------------------------------- 9:05 AM on 08/22/2015 -----------------------------------------   Physical Exam Patient is resting comfortably at this time. No signs of distress. ED Course  Procedures  MDM CT ANGIO CHEST AORTA W/CM &/OR WO/CM (Accession [id number]) (Order [id number])  Imaging  Date: 08/22/2015 Department: [place] EMER[facility]ENCY DEPARTMENT Released By/Authorizing: Loney Hering, MD (auto-released)  PACS Images   Show images for CT ANGIO CHEST AORTA W/CM &/OR WO/CM  Study Result   CLINICAL DATA:  55 year old female with a history of back pain  EXAM: CT ANGIOGRAPHY CHEST WITH CONTRAST  TECHNIQUE: Multidetector CT imaging of the chest was performed using the standard protocol during bolus administration of intravenous contrast. Multiplanar CT image reconstructions and MIPs were obtained to evaluate the vascular anatomy.  CONTRAST:  75 cc Isovue 370  COMPARISON:  None.  FINDINGS: Chest:  Unremarkable appearance of the chest superficial soft tissues.  No axillary or supraclavicular adenopathy.  Unremarkable appearance of the thoracic inlet, including the visualized thyroid.  Several mediastinal lymph nodes, none of which are enlarged by CT size criteria or have suspicious features.  Unremarkable appearance of the esophagus.  Unremarkable course caliber and contour of the  thoracic aorta without dissection flap, aneurysm, periaortic fluid.  No central, lobar, segmental, or proximal subsegmental filling defects to indicate pulmonary emboli.  Heart size within normal limits without pericardial fluid/ thickening.  Dense calcifications in the distribution of the left anterior descending, circumflex coronary arteries.  No confluent airspace disease.  No pleural effusion or pneumothorax.  Geographic ground-glass opacity throughout the lungs. Basilar atelectasis/scarring.  Hiatal hernia.  Upper abdomen:  Unremarkable appearance of the visualized upper abdomen.  Review of the MIP images confirms the above findings.  IMPRESSION: Study is negative for pulmonary emboli.  Two vessel coronary artery disease.  Nonspecific geographic ground-glass opacity of the lungs, which can be seen with small airway disease or small vessel disease. Recommend correlation with a history of smoking.  Signed,  Dulcy Fanny. Earleen Newport, DO  [facility]  [facility]   Electronically Signed   By: Corrie Mckusick D.O.   On: 08/22/2015 07:48     Reviewed the patient's imaging results with her as well as the plan for admission to the hospital. She is understanding and willingness to comply. Will also give dose of aspirin.       Orbie Pyo, MD 08/22/15 [phone]  Signed out to Dr. Posey Pronto of the admitting hospitalist service.   Orbie Pyo, MD 08/22/15 [phone]

## 2015-08-22 NOTE — H&P (Signed)
[facility] [facility] [place] NAME: Brenda Craig    MR#:  [id number]  DATE OF BIRTH:  20-Jul-1960  DATE OF ADMISSION:  08/22/2015  PRIMARY CARE PHYSICIAN: Albina Billet, MD   REQUESTING/REFERRING PHYSICIAN: [place]  CHIEF COMPLAINT:   Chief Complaint  Patient presents with  . Chest Pain    HISTORY OF PRESENT ILLNESS: Brenda Craig  is a 55 y.o. female with a known history of Celiac disease, CHF, coronary artery disease status post stent, hypercholesterolemia, hypertension- had back surgery on L3-4 and 5 laminectomy 2 weeks ago, and for that she was taken off from her aspirin and Brilanta total for 10 days. She started taking it again last week. Today patient had complain of some diarrhea and nausea and started having back pain. Her back pain was in the upper back was sharp and was similar as she had while having 2 previous myocardial infarctions and she required stents for those episodes. She also had episode of excessive sweating with this. She had a cardiac catheterization done in October 2016 showing mild blockages and advised to continue medical management. Also has complained of pain in her jaw. Concerned with her complain ER physician did CT scan angiogram of the chest to check for aorta and dissection, it showed coronary atherosclerosis in 2 vessels. So hospitalist was contacted for keeping her on observation for further management.  PAST MEDICAL HISTORY:   Past Medical History:  Diagnosis Date  . Acid reflux 01/17/2013  . Asthma   . Celiac disease   . CHF (congestive heart failure) ([place])   . Coronary artery disease   . Heart trouble   . Hypercholesteremia   . Hypertension     PAST SURGICAL HISTORY: Past Surgical History:  Procedure Laterality Date  . ABLATION    . BACK SURGERY    . HEART STENTS    . OOPHORECTOMY Right     SOCIAL HISTORY:  Social History  Substance Use Topics  . Smoking status: Never Smoker  . Smokeless  tobacco: Never Used  . Alcohol use Yes     Comment: OCCASIONALLY    FAMILY HISTORY:  Family History  Problem Relation Age of Onset  . CAD Father   . Transient ischemic attack Father     DRUG ALLERGIES:  Allergies  Allergen Reactions  . Codeine Nausea And Vomiting and Rash  . Nexium [Esomeprazole Magnesium] Palpitations  . Omeprazole Magnesium Palpitations  . Percocet [Oxycodone-Acetaminophen] Itching, Nausea And Vomiting and Rash    REVIEW OF SYSTEMS:   CONSTITUTIONAL: No fever, fatigue or weakness.  EYES: No blurred or double vision.  EARS, NOSE, AND THROAT: No tinnitus or ear pain.  RESPIRATORY: No cough, shortness of breath, wheezing or hemoptysis.  CARDIOVASCULAR: No chest pain, orthopnea, edema. Have back pain. GASTROINTESTINAL: No nausea, vomiting, diarrhea or abdominal pain.  GENITOURINARY: No dysuria, hematuria.  ENDOCRINE: No polyuria, nocturia,  HEMATOLOGY: No anemia, easy bruising or bleeding SKIN: No rash or lesion. MUSCULOSKELETAL: No joint pain or arthritis.   NEUROLOGIC: No tingling, numbness, weakness.  PSYCHIATRY: No anxiety or depression.   MEDICATIONS AT HOME:  Prior to Admission medications   Medication Sig Start Date End Date Taking? Authorizing Provider  aspirin EC 81 MG tablet Take 81 mg by mouth daily.   Yes Historical Provider, MD  carvedilol (COREG) 3.125 MG tablet Take 3.125 mg by mouth 2 (two) times daily with a meal.  01/17/13  Yes Historical Provider, MD  HYDROcodone-acetaminophen (NORCO/VICODIN) 5-325 MG tablet  Take 1 tablet by mouth every 8 (eight) hours as needed. 03/09/15  Yes Historical Provider, MD  isosorbide mononitrate (IMDUR) 30 MG 24 hr tablet 15 mg.  03/26/15  Yes Historical Provider, MD  LORazepam (ATIVAN) 1 MG tablet Take 1 mg by mouth every 4 (four) hours as needed for anxiety.  11/14/12  Yes Historical Provider, MD  montelukast (SINGULAIR) 10 MG tablet Take 10 mg by mouth at bedtime.   Yes Historical Provider, MD  NITROSTAT 0.4 MG  SL tablet Place 0.4 mg under the tongue every 5 (five) minutes as needed for chest pain.  11/03/12  Yes Historical Provider, MD  pantoprazole (PROTONIX) 20 MG tablet Take 20 mg by mouth 2 (two) times daily.  11/17/12  Yes Historical Provider, MD  ranitidine (ZANTAC) 150 MG tablet Take 150 mg by mouth daily.   Yes Historical Provider, MD  rosuvastatin (CRESTOR) 10 MG tablet Take 10 mg by mouth at bedtime.  10/16/12  Yes Historical Provider, MD  ticagrelor (BRILINTA) 60 MG TABS tablet Take 60 mg by mouth 2 (two) times daily.  11/06/12  Yes Historical Provider, MD  benzonatate (TESSALON) 100 MG capsule Take 100 mg by mouth 3 (three) times daily. 03/22/15   Historical Provider, MD  cefdinir (OMNICEF) 300 MG capsule TAKE 2 CAPSULES BY MOUTH DAILY FOR 10 DAYS 03/22/15   Historical Provider, MD  diazepam (VALIUM) 5 MG tablet TAKE ONE TABLET BY MOUTH EVERY 8 HOURS AS NEEDED FOR MUSCLE SPASMS 02/15/15   Historical Provider, MD  fluconazole (DIFLUCAN) 150 MG tablet TAKE 1 TABLET BY MOUTH NOW AND MAY REPEAT IN 1 WEEK IF NEEDED 03/09/15   Historical Provider, MD  Lutein 20 MG CAPS Take 1 capsule by mouth daily.    Historical Provider, MD  ondansetron (ZOFRAN) 8 MG tablet TAKE 1 TABLET EVERY 8 HOURS AS NEEDED FOR NAUSEA 02/05/15   Historical Provider, MD  sertraline (ZOLOFT) 50 MG tablet Take 50 mg by mouth at bedtime.  11/17/12   Historical Provider, MD  traMADol (ULTRAM) 50 MG tablet TAKE 1 TABLET BY MOUTH EVERY 6 HOURS AS NEEDED FOR BACK PAIN 01/19/15   Historical Provider, MD  vitamin B-12 (CYANOCOBALAMIN) 1000 MCG tablet Take 1,000 mcg by mouth daily.    Historical Provider, MD  vitamin C (ASCORBIC ACID) 500 MG tablet Take 500 mg by mouth daily.    Historical Provider, MD      PHYSICAL EXAMINATION:   VITAL SIGNS: Blood pressure 128/90, pulse 80, temperature 98.6 F (37 C), temperature source Oral, resp. rate (!) 21, height 5' 5.5" (1.664 m), weight 88.5 kg (195 lb), SpO2 95 %.  GENERAL:  55 y.o.-year-old  patient lying in the bed with no acute distress.  EYES: Pupils equal, round, reactive to light and accommodation. No scleral icterus. Extraocular muscles intact.  HEENT: Head atraumatic, normocephalic. Oropharynx and nasopharynx clear.  NECK:  Supple, no jugular venous distention. No thyroid enlargement, no tenderness.  LUNGS: Normal breath sounds bilaterally, no wheezing, rales,rhonchi or crepitation. No use of accessory muscles of respiration.  CARDIOVASCULAR: S1, S2 normal. No murmurs, rubs, or gallops.  ABDOMEN: Soft, nontender, nondistended. Bowel sounds present. No organomegaly or mass.  EXTREMITIES: No pedal edema, cyanosis, or clubbing.  NEUROLOGIC: Cranial nerves II through XII are intact. Muscle strength 5/5 in all extremities. Sensation intact. Gait not checked.  PSYCHIATRIC: The patient is alert and oriented x 3.  SKIN: No obvious rash, lesion, or ulcer.   LABORATORY PANEL:   CBC  Recent Labs Lab 08/22/15  0544  WBC 8.2  HGB 13.2  HCT 38.6  PLT 278  MCV 84.2  MCH 28.9  MCHC 34.3  RDW 14.9*   ------------------------------------------------------------------------------------------------------------------  Chemistries   Recent Labs Lab 08/22/15 0544  NA 139  K 3.6  CL 107  CO2 24  GLUCOSE 113*  BUN 22*  CREATININE 0.74  CALCIUM 9.3   ------------------------------------------------------------------------------------------------------------------ estimated creatinine clearance is 88.2 mL/min (by C-G formula based on SCr of 0.8 mg/dL). ------------------------------------------------------------------------------------------------------------------ No results for input(s): TSH, T4TOTAL, T3FREE, THYROIDAB in the last 72 hours.  Invalid input(s): FREET3   Coagulation profile No results for input(s): INR, PROTIME in the last 168 hours. ------------------------------------------------------------------------------------------------------------------- No  results for input(s): DDIMER in the last 72 hours. -------------------------------------------------------------------------------------------------------------------  Cardiac Enzymes  Recent Labs Lab 08/22/15 0544  TROPONINI <0.03   ------------------------------------------------------------------------------------------------------------------ Invalid input(s): POCBNP  ---------------------------------------------------------------------------------------------------------------  Urinalysis No results found for: COLORURINE, APPEARANCEUR, LABSPEC, PHURINE, GLUCOSEU, HGBUR, BILIRUBINUR, KETONESUR, PROTEINUR, UROBILINOGEN, NITRITE, LEUKOCYTESUR   RADIOLOGY: Dg Chest 2 View  Result Date: 08/22/2015 CLINICAL DATA:  A small valuation for acute chest pain. EXAM: CHEST  2 VIEW COMPARISON:  Prior radiograph from 10/08/2014. FINDINGS: Cardiac and mediastinal silhouettes are stable in size and contour, and remain within normal limits. Lungs are normally inflated. Scattered linear opacities within the left lung base and right perihilar region most compatible with atelectasis. No consolidative airspace disease. No pulmonary edema or pleural effusion. No pneumothorax. No acute osseous abnormality. IMPRESSION: Scattered bibasilar atelectatic changes. No other active cardiopulmonary disease identified. Electronically Signed   By: Jeannine Boga M.D.   On: 08/22/2015 05:53   Ct Angio Chest Aorta W/cm &/or Wo/cm  Result Date: 08/22/2015 CLINICAL DATA:  55 year old female with a history of back pain EXAM: CT ANGIOGRAPHY CHEST WITH CONTRAST TECHNIQUE: Multidetector CT imaging of the chest was performed using the standard protocol during bolus administration of intravenous contrast. Multiplanar CT image reconstructions and MIPs were obtained to evaluate the vascular anatomy. CONTRAST:  75 cc Isovue 370 COMPARISON:  None. FINDINGS: Chest: Unremarkable appearance of the chest superficial soft tissues. No  axillary or supraclavicular adenopathy. Unremarkable appearance of the thoracic inlet, including the visualized thyroid. Several mediastinal lymph nodes, none of which are enlarged by CT size criteria or have suspicious features. Unremarkable appearance of the esophagus. Unremarkable course caliber and contour of the thoracic aorta without dissection flap, aneurysm, periaortic fluid. No central, lobar, segmental, or proximal subsegmental filling defects to indicate pulmonary emboli. Heart size within normal limits without pericardial fluid/ thickening. Dense calcifications in the distribution of the left anterior descending, circumflex coronary arteries. No confluent airspace disease.  No pleural effusion or pneumothorax. Geographic ground-glass opacity throughout the lungs. Basilar atelectasis/scarring. Hiatal hernia. Upper abdomen: Unremarkable appearance of the visualized upper abdomen. Review of the MIP images confirms the above findings. IMPRESSION: Study is negative for pulmonary emboli. Two vessel coronary artery disease. Nonspecific geographic ground-glass opacity of the lungs, which can be seen with small airway disease or small vessel disease. Recommend correlation with a history of smoking. Signed, Dulcy Fanny. Earleen Newport, DO [facility] Electronically Signed   By: Corrie Mckusick D.O.   On: 08/22/2015 07:48    EKG: Orders placed or performed during the hospital encounter of 08/22/15  . ED EKG within 10 minutes  . ED EKG within 10 minutes  . EKG 12-Lead  . EKG 12-Lead    IMPRESSION AND PLAN: * Coronary artery disease history and presentation with atypical chest pain and back pain.  As per patient the pain is similar as she had in the past with MI.   She was taken off from her antiplatelet therapy for recent back surgery.   Currently troponin is negative, monitor on telemetry and follow serial troponin.   Call cardiology consult for further  management.   Continue cardiac medications for now.  * Hypertension   Continue home medications.  * Hyperlipidemia   Continue statin.  * History of peptic ulcer disease and GERD   Continue PPI.  * Anxiety and depression.   Continue baseline home medications.   All the records are reviewed and case discussed with ED provider. Management plans discussed with the patient, family and they are in agreement.  CODE STATUS: Code Status History    Date Active Date Inactive Code Status Order ID Comments User Context   10/08/2014  8:16 PM 10/10/2014  3:06 PM Full Code [id number]  Epifanio Lesches, MD ED       TOTAL TIME TAKING CARE OF THIS PATIENT: 50 minutes.    Vaughan Basta M.D on 08/22/2015   Between 7am to 6pm - Pager - [phone]  After 6pm go to [email or web address] - password [email or web address]  Office  [phone]  CC: Primary care physician; Albina Billet, MD   Note: This dictation was prepared with Dragon dictation along with smaller phrase technology. Any transcriptional errors that result from this process are unintentional.

## 2015-08-22 NOTE — Progress Notes (Signed)
Patient admitted to unit from Ed for chest pain, patient alert and oriented denies  pain at this time, vss will continue to monitor

## 2015-08-22 NOTE — Consult Note (Signed)
Cardiology Consultation Note  Patient ID: Brenda Craig, MRN: [id number], DOB/AGE: [birth year] 55 y.o. Admit date: 08/22/2015   Date of Consult: 08/22/2015 Primary Physician: Albina Billet, MD Primary Cardiologist: Larina Bras (Dr. Manuella Ghazi, MD) Requesting Physician: Dr. Anselm Jungling, MD  Chief Complaint: Back pain Reason for Consult: Back pain  HPI: 55 y.o. female with h/o 2-vessel CAD s.p PCI/stenting to to LCx in 2013 in the setting of an ST elevation MI complicated by Vfib arrest s/p PCI/dtenting to LAD in the setting of unstable angina in 2014, chronic back pain s/p multiple surgeries, celiac disease, HTN, and anxiety who presented to [facility] on 8/14 with upper midline back pain.   She was previously admitted to [facility] in October for similar upper back pain. She ruled out and was advised to follow up with primary cardiologist. In follow up with her primary cardiologist she underwent repeat cardiac cath on 10/14/2014 that showed patent stents along LAD and LCx with 50% LAD stenosis that was not significant by FFR. She had remained symptom free in her follow up with Cardiology. She was recently cleared for back surgery number 3 (L4-L5 fusion) two weeks ago by cardiology. Her Brilinta 60 mg bid was held peri-operatively. She has been more sedentary as of late given her recent back surgery and had noted persistent back pain. Pain is located along the upper midline back. No radiation. Never with chest pain, though she never had chest pain with her MI as above. Some associated SOB, diaphoresis, nausea, vomiting, as well as diarrhea. Her pain became quite severe overnight rated at an 8/10. She took two SL NTG with improvement to 5/10. Her BP was noted to be 161/116 at home. She and her mother called EMS.   Upon the patient's arrival to [facility] they were found to have negative troponin x 1 at time of cardiology consult, unremarkable CBC, SCr 0.74, K+ 3.6. ECG as below, CXR showed scattered bibasilar atelectasis. CTA  chest was negative for PE, with no evidence of dissection or aneurysm. 2-vessel CAD was noted. Her blood pressure is currently in the 1-teens systolic with nitro paste. She continues to note 5/10 back pain.   Past Medical History:  Diagnosis Date  . Acid reflux 01/17/2013  . Asthma   . Celiac disease   . CHF (congestive heart failure) ([place])   . Coronary artery disease   . Heart trouble   . Hypercholesteremia   . Hypertension       Most Recent Cardiac Studies: Cardiac Catheterization 11/01/14: Final results : 1. Clinical : Recurrent chest pains etiology undetermined. 2. Anatomic : Normal LAD and marginal circumflex stents. Mild progression of atherosclerosis about 10% at all focal previous stenosis. Mid LAD has 50% stenosis. Negative FFR for hemodynamic significance. Other stenosis including 30% mid and distal right coronary artery and 30% followed by 50% mid LAD stenosis. Ejection fraction improved 55-60%.  Cardiac Catheterization 03/28/13: FINAL DIAGNOSES: 1. Clinical - Chest pain suggestive of unstable or progressive angina. 2. Anatomical - Progression of coronary artery disease, 70 percent mid left anterior descending stenosis increased to 95 percent. This was decreased to 0 percent with the help of a 16/3.5 millimeter PROMUS Premiere drug-eluting stent, followed by a kissing balloon angioplasty between the first diagonal branch and mid left anterior descending. The distal left anterior descending has mild 30 percent stenosis. The right coronary artery has mild 20 percent stenosis. Circumflex marginal stent is normal. 3. Ejection fraction 60 percent.  Lexiscan Stress Cardiolite report 10/04/2011: Stress echocardiogram by  definitely study-negative for ischemia. Normal ejection fraction of 84%. Negative for ischemia or infarction. 13 minutes total exercise and modified Bruce protocol.  Echocardiogram Complete 05/27/11: Summary: The study is technically limited due to poor  acoustic windows.  Mild concentric left ventricular hypertrophy is observed. The EF is estimated at 45-50%. Moderate posterior and lateral wall hypokinesis noted. Abnormal left ventricular diastolic filling is observed, consistent with impaired LV relaxation(Stage I). There is trivial mitral and tricuspid regurgitation. There is no pericardial effusion. No evidence of thrombus.   Surgical History:  Past Surgical History:  Procedure Laterality Date  . ABLATION    . BACK SURGERY    . HEART STENTS    . OOPHORECTOMY Right      Home Meds: Prior to Admission medications   Medication Sig Start Date End Date Taking? Authorizing Provider  aspirin EC 81 MG tablet Take 81 mg by mouth daily.   Yes Historical Provider, MD  carvedilol (COREG) 3.125 MG tablet Take 3.125 mg by mouth 2 (two) times daily with a meal.  01/17/13  Yes Historical Provider, MD  HYDROcodone-acetaminophen (NORCO/VICODIN) 5-325 MG tablet Take 1 tablet by mouth every 8 (eight) hours as needed. 03/09/15  Yes Historical Provider, MD  isosorbide mononitrate (IMDUR) 30 MG 24 hr tablet 15 mg.  03/26/15  Yes Historical Provider, MD  LORazepam (ATIVAN) 1 MG tablet Take 1 mg by mouth every 4 (four) hours as needed for anxiety.  11/14/12  Yes Historical Provider, MD  montelukast (SINGULAIR) 10 MG tablet Take 10 mg by mouth at bedtime.   Yes Historical Provider, MD  NITROSTAT 0.4 MG SL tablet Place 0.4 mg under the tongue every 5 (five) minutes as needed for chest pain.  11/03/12  Yes Historical Provider, MD  pantoprazole (PROTONIX) 20 MG tablet Take 20 mg by mouth 2 (two) times daily.  11/17/12  Yes Historical Provider, MD  ranitidine (ZANTAC) 150 MG tablet Take 150 mg by mouth daily.   Yes Historical Provider, MD  rosuvastatin (CRESTOR) 10 MG tablet Take 10 mg by mouth at bedtime.  10/16/12  Yes Historical Provider, MD  ticagrelor (BRILINTA) 60 MG TABS tablet Take 60 mg by mouth 2 (two) times daily.  11/06/12  Yes Historical Provider, MD    benzonatate (TESSALON) 100 MG capsule Take 100 mg by mouth 3 (three) times daily. 03/22/15   Historical Provider, MD  cefdinir (OMNICEF) 300 MG capsule TAKE 2 CAPSULES BY MOUTH DAILY FOR 10 DAYS 03/22/15   Historical Provider, MD  diazepam (VALIUM) 5 MG tablet TAKE ONE TABLET BY MOUTH EVERY 8 HOURS AS NEEDED FOR MUSCLE SPASMS 02/15/15   Historical Provider, MD  fluconazole (DIFLUCAN) 150 MG tablet TAKE 1 TABLET BY MOUTH NOW AND MAY REPEAT IN 1 WEEK IF NEEDED 03/09/15   Historical Provider, MD  Lutein 20 MG CAPS Take 1 capsule by mouth daily.    Historical Provider, MD  ondansetron (ZOFRAN) 8 MG tablet TAKE 1 TABLET EVERY 8 HOURS AS NEEDED FOR NAUSEA 02/05/15   Historical Provider, MD  sertraline (ZOLOFT) 50 MG tablet Take 50 mg by mouth at bedtime.  11/17/12   Historical Provider, MD  traMADol (ULTRAM) 50 MG tablet TAKE 1 TABLET BY MOUTH EVERY 6 HOURS AS NEEDED FOR BACK PAIN 01/19/15   Historical Provider, MD  vitamin B-12 (CYANOCOBALAMIN) 1000 MCG tablet Take 1,000 mcg by mouth daily.    Historical Provider, MD  vitamin C (ASCORBIC ACID) 500 MG tablet Take 500 mg by mouth daily.    Historical Provider,  MD    Inpatient Medications:  . aspirin  324 mg Oral Once      Allergies:  Allergies  Allergen Reactions  . Codeine Nausea And Vomiting and Rash  . Nexium [Esomeprazole Magnesium] Palpitations  . Omeprazole Magnesium Palpitations  . Percocet [Oxycodone-Acetaminophen] Itching, Nausea And Vomiting and Rash    Social History   Social History  . Marital status: Divorced    Spouse name: N/A  . Number of children: N/A  . Years of education: N/A   Occupational History  . Not on file.   Social History Main Topics  . Smoking status: Never Smoker  . Smokeless tobacco: Never Used  . Alcohol use Yes     Comment: OCCASIONALLY  . Drug use: No  . Sexual activity: Not Currently   Other Topics Concern  . Not on file   Social History Narrative  . No narrative on file     Family History   Problem Relation Age of Onset  . CAD Father   . Transient ischemic attack Father      Review of Systems: Review of Systems  Constitutional: Positive for diaphoresis and malaise/fatigue. Negative for chills, fever and weight loss.  HENT: Negative for congestion.   Eyes: Negative for discharge and redness.  Respiratory: Negative for cough, hemoptysis, sputum production, shortness of breath and wheezing.   Cardiovascular: Negative for chest pain, palpitations, orthopnea, claudication, leg swelling and PND.  Gastrointestinal: Positive for diarrhea, nausea and vomiting. Negative for abdominal pain, blood in stool, heartburn and melena.  Genitourinary: Negative for hematuria.  Musculoskeletal: Positive for back pain. Negative for falls and myalgias.  Skin: Negative for rash.  Neurological: Positive for weakness. Negative for dizziness, tingling, tremors, sensory change, speech change, focal weakness and loss of consciousness.  Endo/Heme/Allergies: Does not bruise/bleed easily.  Psychiatric/Behavioral: Negative for substance abuse. The patient is nervous/anxious.   All other systems reviewed and are negative.   Labs:  Recent Labs  08/22/15 0544 08/22/15 0940  TROPONINI <0.03 <0.03   Lab Results  Component Value Date   WBC 8.2 08/22/2015   HGB 13.2 08/22/2015   HCT 38.6 08/22/2015   MCV 84.2 08/22/2015   PLT 278 08/22/2015     Recent Labs Lab 08/22/15 0544  NA 139  K 3.6  CL 107  CO2 24  BUN 22*  CREATININE 0.74  CALCIUM 9.3  GLUCOSE 113*   Lab Results  Component Value Date   CHOL 106 08/22/2015   HDL 42 08/22/2015   LDLCALC 43 08/22/2015   TRIG 107 08/22/2015   No results found for: DDIMER  Radiology/Studies:  Dg Chest 2 View  Result Date: 08/22/2015 CLINICAL DATA:  A small valuation for acute chest pain. EXAM: CHEST  2 VIEW COMPARISON:  Prior radiograph from 10/08/2014. FINDINGS: Cardiac and mediastinal silhouettes are stable in size and contour, and remain  within normal limits. Lungs are normally inflated. Scattered linear opacities within the left lung base and right perihilar region most compatible with atelectasis. No consolidative airspace disease. No pulmonary edema or pleural effusion. No pneumothorax. No acute osseous abnormality. IMPRESSION: Scattered bibasilar atelectatic changes. No other active cardiopulmonary disease identified. Electronically Signed   By: Jeannine Boga M.D.   On: 08/22/2015 05:53   Ct Angio Chest Aorta W/cm &/or Wo/cm  Result Date: 08/22/2015 CLINICAL DATA:  55 year old female with a history of back pain EXAM: CT ANGIOGRAPHY CHEST WITH CONTRAST TECHNIQUE: Multidetector CT imaging of the chest was performed using the standard protocol during bolus  administration of intravenous contrast. Multiplanar CT image reconstructions and MIPs were obtained to evaluate the vascular anatomy. CONTRAST:  75 cc Isovue 370 COMPARISON:  None. FINDINGS: Chest: Unremarkable appearance of the chest superficial soft tissues. No axillary or supraclavicular adenopathy. Unremarkable appearance of the thoracic inlet, including the visualized thyroid. Several mediastinal lymph nodes, none of which are enlarged by CT size criteria or have suspicious features. Unremarkable appearance of the esophagus. Unremarkable course caliber and contour of the thoracic aorta without dissection flap, aneurysm, periaortic fluid. No central, lobar, segmental, or proximal subsegmental filling defects to indicate pulmonary emboli. Heart size within normal limits without pericardial fluid/ thickening. Dense calcifications in the distribution of the left anterior descending, circumflex coronary arteries. No confluent airspace disease.  No pleural effusion or pneumothorax. Geographic ground-glass opacity throughout the lungs. Basilar atelectasis/scarring. Hiatal hernia. Upper abdomen: Unremarkable appearance of the visualized upper abdomen. Review of the MIP images confirms the  above findings. IMPRESSION: Study is negative for pulmonary emboli. Two vessel coronary artery disease. Nonspecific geographic ground-glass opacity of the lungs, which can be seen with small airway disease or small vessel disease. Recommend correlation with a history of smoking. Signed, Dulcy Fanny. Earleen Newport, DO [facility] Radiology Electronically Signed   By: Corrie Mckusick D.O.   On: 08/22/2015 07:48    EKG: Interpreted by me showed: NSR, 85 bpm, no acute st/t changes  Telemetry: Interpreted by me showed: NSR, 80's bpm  Weights: Filed Weights   08/22/15 0518  Weight: 195 lb (88.5 kg)     Physical Exam: Blood pressure 128/90, pulse 80, temperature 98.6 F (37 C), temperature source Oral, resp. rate (!) 21, height 5' 5.5" (1.664 m), weight 195 lb (88.5 kg), SpO2 95 %. Body mass index is 31.96 kg/m. General: Well developed, well nourished, in no acute distress. Head: Normocephalic, atraumatic, sclera non-icteric, no xanthomas, nares are without discharge.  Neck: Negative for carotid bruits. JVD not elevated. Lungs: Clear bilaterally to auscultation without wheezes, rales, or rhonchi. Breathing is unlabored. Heart: RRR with S1 S2. No murmurs, rubs, or gallops appreciated. Abdomen: Obese, soft, non-tender, non-distended with normoactive bowel sounds. No hepatomegaly. No rebound/guarding. No obvious abdominal masses. Msk:  Strength and tone appear normal for age. No TTP of the upper midline back. Extremities: No clubbing or cyanosis. No edema. Distal pedal pulses are 2+ and equal bilaterally. Neuro: Alert and oriented X 3. No facial asymmetry. No focal deficit. Moves all extremities spontaneously. Psych:  Responds to questions appropriately with a normal affect.    Assessment and Plan:  Principal Problem:   Back pain Active Problems:   CAD in native artery   S/P coronary artery stent placement   Anxiety   Celiac disease    Hyperlipidemia    1. Back pain/atypical chest pain/CAD as above: -Pain is not as severe as prior back pain associated with MI -Still with back pain -Troponin negative x 1 at time of cardiology consultation  -Recent cardiac cath 10/14/2014 that showed patent stents along the LAD and LCx with 50% stenosis of the LAD not significant by FFR -She has off Brilinta 60 mg bid in the peri-operative time frame, though last stent was 2014 -Can check an echo to evaluate LV systolic function, if normal could defer further evaluation to primary cardiologist vs scheduling inpatient nuclear stress testing -Continue ASA and Brilinta 60 mg bid -Can increase Imdur to 60 mg daily -Consider adding Ranexa -Continue Coreg and Crestor  2. HTN: -Well controlled  -Continue  current medications  3. HLD: -Crestor as above  4. Chronic back pain: -Status post 3rd surgery -Per IM  5. Anxiety: -Per IM  6. N/V/D: -Possible exacerbation of patient's Celiac disease    Signed, Marcille Blanco [place] Pager: [phone] 08/22/2015, 10:24 AM

## 2015-08-22 NOTE — ED Notes (Signed)
MD Webster at bedside 

## 2015-08-22 NOTE — Progress Notes (Signed)
PHARMACIST - PHYSICIAN ORDER COMMUNICATION  CONCERNING: P&T Medication Policy on Herbal Medications  DESCRIPTION:  This patient's order for:  Lutein  has been noted.  This product(s) is classified as an "herbal" or natural product. Due to a lack of definitive safety studies or FDA approval, nonstandard manufacturing practices, plus the potential risk of unknown drug-drug interactions while on inpatient medications, the Pharmacy and Therapeutics Committee does not permit the use of "herbal" or natural products of this type within [facility].   ACTION TAKEN: The pharmacy department is unable to verify this order at this time. Please reevaluate patient's clinical condition at discharge and address if the herbal or natural product(s) should be resumed at that time.

## 2015-08-22 NOTE — ED Notes (Signed)
Pt reports she took a 5-325 norco/vicodin at 05:15

## 2015-08-23 ENCOUNTER — Observation Stay (HOSPITAL_BASED_OUTPATIENT_CLINIC_OR_DEPARTMENT_OTHER)
Admit: 2015-08-23 | Discharge: 2015-08-23 | Disposition: A | Discharge status: Home / Self Care | Payer: BC Managed Care – PPO | Attending: Cardiovascular Disease | Admitting: Cardiovascular Disease

## 2015-08-23 DIAGNOSIS — K591 Functional diarrhea: Secondary | ICD-10-CM

## 2015-08-23 DIAGNOSIS — R079 Chest pain, unspecified: Secondary | ICD-10-CM

## 2015-08-23 DIAGNOSIS — K900 Celiac disease: Secondary | ICD-10-CM | POA: Diagnosis not present

## 2015-08-23 DIAGNOSIS — F419 Anxiety disorder, unspecified: Secondary | ICD-10-CM | POA: Diagnosis not present

## 2015-08-23 DIAGNOSIS — M546 Pain in thoracic spine: Secondary | ICD-10-CM | POA: Diagnosis not present

## 2015-08-23 DIAGNOSIS — I2510 Atherosclerotic heart disease of native coronary artery without angina pectoris: Secondary | ICD-10-CM | POA: Diagnosis not present

## 2015-08-23 LAB — BASIC METABOLIC PANEL
Anion gap: 6 (ref 5–15)
BUN: 22 mg/dL — ABNORMAL HIGH (ref 6–20)
CO2: 28 mmol/L (ref 22–32)
Calcium: 9.1 mg/dL (ref 8.9–10.3)
Chloride: 105 mmol/L (ref 101–111)
Creatinine, Ser: 0.89 mg/dL (ref 0.44–1.00)
GFR calc Af Amer: >60 mL/min (ref >60)
GFR calc non Af Amer: >60 mL/min (ref >60)
Glucose, Bld: 108 mg/dL — ABNORMAL HIGH (ref 65–99)
Potassium: 3.8 mmol/L (ref 3.5–5.1)
Sodium: 139 mmol/L (ref 135–145)

## 2015-08-23 LAB — CBC
HCT: 37.4 % (ref 35.0–47.0)
Hemoglobin: 12.8 g/dL (ref 12.0–16.0)
MCH: 29.2 pg (ref 26.0–34.0)
MCHC: 34.1 g/dL (ref 32.0–36.0)
MCV: 85.6 fL (ref 80.0–100.0)
Platelets: 255 10*3/uL (ref 150–440)
RBC: 4.37 MIL/uL (ref 3.80–5.20)
RDW: 14.9 % — ABNORMAL HIGH (ref 11.5–14.5)
WBC: 6.6 10*3/uL (ref 3.6–11.0)

## 2015-08-23 LAB — ECHOCARDIOGRAM COMPLETE
Height: 65.5 in
Weight: 3116.8 oz

## 2015-08-23 MED ORDER — DIAZEPAM 5 MG PO TABS: 5.0000 mg | ORAL_TABLET | Freq: Two times a day (BID) | ORAL | 0 refills | Status: DC | PRN

## 2015-08-23 NOTE — Discharge Instructions (Signed)
Follow with neuro surgery as advised after surgery.  If back and chest pain re-occures- see the cardiologist in clinic.

## 2015-08-23 NOTE — Progress Notes (Signed)
Discharge instructions along with hard copy prescription given to patient. Patient verbalized understanding. Patient eating lunch now, no distress, waiting on ride. Will remove IV and tele once ride arrives.

## 2015-08-23 NOTE — Progress Notes (Signed)
Patient: Brenda Craig / Admit Date: 08/22/2015 / Date of Encounter: 08/23/2015, 7:46 AM   Subjective: She is for echo this morning. No acute overnight events. 3 episodes of sharp split second back pain overnight that resolved almost as soon as it began. No pain currently.   Review of Systems: Review of Systems  Constitutional: Positive for malaise/fatigue. Negative for chills, diaphoresis, fever and weight loss.  HENT: Negative for congestion.   Eyes: Negative for discharge and redness.  Respiratory: Negative for cough, sputum production, shortness of breath and wheezing.   Cardiovascular: Negative for chest pain, palpitations, orthopnea, claudication, leg swelling and PND.  Gastrointestinal: Negative for abdominal pain, heartburn, nausea and vomiting.  Musculoskeletal: Positive for back pain. Negative for falls and myalgias.  Skin: Negative for rash.  Neurological: Negative for dizziness, tingling, tremors, sensory change, speech change, focal weakness, loss of consciousness and weakness.  Endo/Heme/Allergies: Does not bruise/bleed easily.  Psychiatric/Behavioral: Negative for substance abuse. The patient is not nervous/anxious.     Objective: Telemetry: NSR, 90's bpm Physical Exam: Blood pressure 123/67, pulse 83, temperature 98.6 F (37 C), temperature source Oral, resp. rate 18, height 5' 5.5" (1.664 m), weight 194 lb 12.8 oz (88.4 kg), SpO2 95 %. Body mass index is 31.92 kg/m. General: Well developed, well nourished, in no acute distress. Head: Normocephalic, atraumatic, sclera non-icteric, no xanthomas, nares are without discharge. Neck: Negative for carotid bruits. JVP not elevated. Lungs: Clear bilaterally to auscultation without wheezes, rales, or rhonchi. Breathing is unlabored. Heart: RRR S1 S2 without murmurs, rubs, or gallops.  Abdomen: Soft, non-tender, non-distended with normoactive bowel sounds. No rebound/guarding. Extremities: No clubbing or cyanosis. No  edema. Distal pedal pulses are 2+ and equal bilaterally. Neuro: Alert and oriented X 3. Moves all extremities spontaneously. Psych:  Responds to questions appropriately with a normal affect.   Intake/Output Summary (Last 24 hours) at 08/23/15 0746 Last data filed at 08/23/15 0606  Gross per 24 hour  Intake              480 ml  Output              550 ml  Net              -70 ml    Inpatient Medications:  . aspirin EC  81 mg Oral Daily  . benzonatate  100 mg Oral TID  . carvedilol  3.125 mg Oral BID WC  . heparin  5,000 Units Subcutaneous Q8H  . isosorbide mononitrate  15 mg Oral Daily  . montelukast  10 mg Oral QHS  . pantoprazole  40 mg Oral BID  . rosuvastatin  10 mg Oral QHS  . sertraline  50 mg Oral QHS  . sodium chloride flush  3 mL Intravenous Q12H  . ticagrelor  60 mg Oral BID  . vitamin B-12  1,000 mcg Oral Daily  . vitamin C  500 mg Oral Daily   Infusions:    Labs:  Recent Labs  08/22/15 0544 08/22/15 1334 08/23/15 0420  NA 139  --  139  K 3.6  --  3.8  CL 107  --  105  CO2 24  --  28  GLUCOSE 113*  --  108*  BUN 22*  --  22*  CREATININE 0.74 0.75 0.89  CALCIUM 9.3  --  9.1   No results for input(s): AST, ALT, ALKPHOS, BILITOT, PROT, ALBUMIN in the last 72 hours.  Recent Labs  08/22/15 1334 08/23/15 0420  WBC 6.8 6.6  HGB 13.0 12.8  HCT 38.7 37.4  MCV 85.4 85.6  PLT 295 255    Recent Labs  08/22/15 0544 08/22/15 0940 08/22/15 1334 08/22/15 1757  TROPONINI <0.03 <0.03 <0.03 <0.03   Invalid input(s): POCBNP No results for input(s): HGBA1C in the last 72 hours.   Weights: Filed Weights   08/22/15 0518 08/22/15 1150  Weight: 195 lb (88.5 kg) 194 lb 12.8 oz (88.4 kg)     Radiology/Studies:  Dg Chest 2 View  Result Date: 08/22/2015 CLINICAL DATA:  A small valuation for acute chest pain. EXAM: CHEST  2 VIEW COMPARISON:  Prior radiograph from 10/08/2014. FINDINGS: Cardiac and mediastinal silhouettes are stable in size and contour, and  remain within normal limits. Lungs are normally inflated. Scattered linear opacities within the left lung base and right perihilar region most compatible with atelectasis. No consolidative airspace disease. No pulmonary edema or pleural effusion. No pneumothorax. No acute osseous abnormality. IMPRESSION: Scattered bibasilar atelectatic changes. No other active cardiopulmonary disease identified. Electronically Signed   By: Jeannine Boga M.D.   On: 08/22/2015 05:53   Ct Angio Chest Aorta W/cm &/or Wo/cm  Result Date: 08/22/2015 CLINICAL DATA:  55 year old female with a history of back pain EXAM: CT ANGIOGRAPHY CHEST WITH CONTRAST TECHNIQUE: Multidetector CT imaging of the chest was performed using the standard protocol during bolus administration of intravenous contrast. Multiplanar CT image reconstructions and MIPs were obtained to evaluate the vascular anatomy. CONTRAST:  75 cc Isovue 370 COMPARISON:  None. FINDINGS: Chest: Unremarkable appearance of the chest superficial soft tissues. No axillary or supraclavicular adenopathy. Unremarkable appearance of the thoracic inlet, including the visualized thyroid. Several mediastinal lymph nodes, none of which are enlarged by CT size criteria or have suspicious features. Unremarkable appearance of the esophagus. Unremarkable course caliber and contour of the thoracic aorta without dissection flap, aneurysm, periaortic fluid. No central, lobar, segmental, or proximal subsegmental filling defects to indicate pulmonary emboli. Heart size within normal limits without pericardial fluid/ thickening. Dense calcifications in the distribution of the left anterior descending, circumflex coronary arteries. No confluent airspace disease.  No pleural effusion or pneumothorax. Geographic ground-glass opacity throughout the lungs. Basilar atelectasis/scarring. Hiatal hernia. Upper abdomen: Unremarkable appearance of the visualized upper abdomen. Review of the MIP images  confirms the above findings. IMPRESSION: Study is negative for pulmonary emboli. Two vessel coronary artery disease. Nonspecific geographic ground-glass opacity of the lungs, which can be seen with small airway disease or small vessel disease. Recommend correlation with a history of smoking. Signed, Dulcy Fanny. Earleen Newport, DO [facility] Radiology Electronically Signed   By: Corrie Mckusick D.O.   On: 08/22/2015 07:48     Assessment and Plan  Principal Problem:   Back pain Active Problems:   CAD in native artery   S/P coronary artery stent placement   Anxiety   Celiac disease   Hyperlipidemia   Diarrhea    1. Back pain/atypical chest pain/CAD as above: -Pain is not as severe as prior back pain associated with MI -No current back pain -Troponin negative x 3  -Recent cardiac cath 10/14/2014 that showed patent stents along the LAD and LCx with 50% stenosis of the LAD not significant by FFR -She has off Brilinta 60 mg bid in the peri-operative time frame, though last stent was 2014 -Echo to evaluate LV systolic function, if normal could defer further evaluation to primary cardiologist vs scheduling inpatient nuclear stress testing if abnormal  -Continue ASA  and Brilinta 60 mg bid -Continue Imdur to 60 mg daily -Consider adding Ranexa -Continue Coreg and Crestor  2. HTN: -Well controlled  -Continue current medications  3. HLD: -Crestor as above  4. Chronic back pain: -Status post 3rd surgery -Per IM  5. Anxiety: -Per IM  6. N/V/D: -Possible exacerbation of patient's Celiac disease   Signed, Marcille Blanco [facility] Pager: [phone] 08/23/2015, 7:46 AM

## 2015-08-23 NOTE — Progress Notes (Signed)
*  PRELIMINARY RESULTS* Echocardiogram 2D Echocardiogram has been performed.  Sherrie Sport 08/23/2015, 9:51 AM

## 2015-08-23 NOTE — Discharge Summary (Signed)
[facility] NAME: Brenda Craig    MR#:  [id number]  DATE OF BIRTH:  21-Mar-1960  DATE OF ADMISSION:  08/22/2015 ADMITTING PHYSICIAN: Vaughan Basta, MD  DATE OF DISCHARGE: 08/23/2015  PRIMARY CARE PHYSICIAN: Albina Billet, MD    ADMISSION DIAGNOSIS:  Back pain [M54.9] Nausea [R11.0] Pre-syncope [R55] Midline thoracic back pain [M54.6] Diarrhea, unspecified type [R19.7]  DISCHARGE DIAGNOSIS:  Principal Problem:   Back pain Active Problems:   CAD in native artery   S/P coronary artery stent placement   Hyperlipidemia   Anxiety   Celiac disease   Diarrhea   SECONDARY DIAGNOSIS:   Past Medical History:  Diagnosis Date  . Acid reflux 01/17/2013  . Asthma   . Celiac disease   . CHF (congestive heart failure) ([place])   . Coronary artery disease   . Heart trouble   . Hypercholesteremia   . Hypertension     HOSPITAL COURSE:    * Coronary artery disease history and presentation with atypical chest pain and back pain.   As per patient the pain is similar as she had in the past with MI.   She was taken off from her antiplatelet therapy for recent back surgery.   Currently troponin is negative, monitored on telemetry and follow serial troponin- negative.   Called cardiology consult for further management.   Continue cardiac medications for now.   Pt remained stable on telemetry, and pain free.    Cardiology cleared for discharge.   Suggested to follow with office , if pain re-appears.  * Hypertension   Continue home medications.  * Hyperlipidemia   Continue statin.  * History of peptic ulcer disease and GERD   Continue PPI.  * Anxiety and depression.   Continue baseline home medications.  DISCHARGE CONDITIONS:   Stable.  CONSULTS OBTAINED:  Treatment Team:  Minna Merritts, MD  DRUG ALLERGIES:   Allergies  Allergen Reactions  . Codeine Nausea And Vomiting and Rash  . Nexium  [Esomeprazole Magnesium] Palpitations  . Omeprazole Magnesium Palpitations  . Percocet [Oxycodone-Acetaminophen] Itching, Nausea And Vomiting and Rash    DISCHARGE MEDICATIONS:   Current Discharge Medication List    CONTINUE these medications which have CHANGED   Details  diazepam (VALIUM) 5 MG tablet Take 1 tablet (5 mg total) by mouth every 12 (twelve) hours as needed for anxiety. Qty: 10 tablet, Refills: 0      CONTINUE these medications which have NOT CHANGED   Details  aspirin EC 81 MG tablet Take 81 mg by mouth daily.    carvedilol (COREG) 3.125 MG tablet Take 3.125 mg by mouth 2 (two) times daily with a meal.     HYDROcodone-acetaminophen (NORCO/VICODIN) 5-325 MG tablet Take 1 tablet by mouth every 8 (eight) hours as needed. Refills: 0    isosorbide mononitrate (IMDUR) 30 MG 24 hr tablet 15 mg.     LORazepam (ATIVAN) 1 MG tablet Take 1 mg by mouth every 4 (four) hours as needed for anxiety.     montelukast (SINGULAIR) 10 MG tablet Take 10 mg by mouth at bedtime.    NITROSTAT 0.4 MG SL tablet Place 0.4 mg under the tongue every 5 (five) minutes as needed for chest pain.     pantoprazole (PROTONIX) 20 MG tablet Take 20 mg by mouth 2 (two) times daily.     ranitidine (ZANTAC) 150 MG tablet Take 150 mg by mouth daily.    rosuvastatin (CRESTOR) 10  MG tablet Take 10 mg by mouth at bedtime.     ticagrelor (BRILINTA) 60 MG TABS tablet Take 60 mg by mouth 2 (two) times daily.     benzonatate (TESSALON) 100 MG capsule Take 100 mg by mouth 3 (three) times daily. Refills: 0    fluconazole (DIFLUCAN) 150 MG tablet TAKE 1 TABLET BY MOUTH NOW AND MAY REPEAT IN 1 WEEK IF NEEDED Refills: 1    Lutein 20 MG CAPS Take 1 capsule by mouth daily.    ondansetron (ZOFRAN) 8 MG tablet TAKE 1 TABLET EVERY 8 HOURS AS NEEDED FOR NAUSEA Refills: 0    sertraline (ZOLOFT) 50 MG tablet Take 50 mg by mouth at bedtime.     traMADol (ULTRAM) 50 MG tablet TAKE 1 TABLET BY MOUTH EVERY 6 HOURS  AS NEEDED FOR BACK PAIN Refills: 2    vitamin B-12 (CYANOCOBALAMIN) 1000 MCG tablet Take 1,000 mcg by mouth daily.    vitamin C (ASCORBIC ACID) 500 MG tablet Take 500 mg by mouth daily.      STOP taking these medications     cefdinir (OMNICEF) 300 MG capsule          DISCHARGE INSTRUCTIONS:    Follow with cardio, if pain re-appears. Follow with neurosurgery as advised.  If you experience worsening of your admission symptoms, develop shortness of breath, life threatening emergency, suicidal or homicidal thoughts you must seek medical attention immediately by calling 911 or calling your MD immediately  if symptoms less severe.  You Must read complete instructions/literature along with all the possible adverse reactions/side effects for all the Medicines you take and that have been prescribed to you. Take any new Medicines after you have completely understood and accept all the possible adverse reactions/side effects.   Please note  You were cared for by a hospitalist during your hospital stay. If you have any questions about your discharge medications or the care you received while you were in the hospital after you are discharged, you can call the unit and asked to speak with the hospitalist on call if the hospitalist that took care of you is not available. Once you are discharged, your primary care physician will handle any further medical issues. Please note that NO REFILLS for any discharge medications will be authorized once you are discharged, as it is imperative that you return to your primary care physician (or establish a relationship with a primary care physician if you do not have one) for your aftercare needs so that they can reassess your need for medications and monitor your lab values.    Today   CHIEF COMPLAINT:   Chief Complaint  Patient presents with  . Chest Pain    HISTORY OF PRESENT ILLNESS:  Brenda Craig  is a 55 y.o. female with a known history of  Celiac disease, CHF, coronary artery disease status post stent, hypercholesterolemia, hypertension- had back surgery on L3-4 and 5 laminectomy 2 weeks ago, and for that she was taken off from her aspirin and Brilanta total for 10 days. She started taking it again last week. Today patient had complain of some diarrhea and nausea and started having back pain. Her back pain was in the upper back was sharp and was similar as she had while having 2 previous myocardial infarctions and she required stents for those episodes. She also had episode of excessive sweating with this. She had a cardiac catheterization done in October 2016 showing mild blockages and advised to continue medical management. Also  has complained of pain in her jaw. Concerned with her complain ER physician did CT scan angiogram of the chest to check for aorta and dissection, it showed coronary atherosclerosis in 2 vessels. So hospitalist was contacted for keeping her on observation for further management.   VITAL SIGNS:  Blood pressure 128/78, pulse 79, temperature 98.2 F (36.8 C), temperature source Oral, resp. rate 16, height 5' 5.5" (1.664 m), weight 88.4 kg (194 lb 12.8 oz), SpO2 97 %.  I/O:   Intake/Output Summary (Last 24 hours) at 08/23/15 1141 Last data filed at 08/23/15 1059  Gross per 24 hour  Intake              840 ml  Output              750 ml  Net               90 ml    PHYSICAL EXAMINATION:  GENERAL:  55 y.o.-year-old patient lying in the bed with no acute distress.  EYES: Pupils equal, round, reactive to light and accommodation. No scleral icterus. Extraocular muscles intact.  HEENT: Head atraumatic, normocephalic. Oropharynx and nasopharynx clear.  NECK:  Supple, no jugular venous distention. No thyroid enlargement, no tenderness.  LUNGS: Normal breath sounds bilaterally, no wheezing, rales,rhonchi or crepitation. No use of accessory muscles of respiration.  CARDIOVASCULAR: S1, S2 normal. No murmurs, rubs,  or gallops.  ABDOMEN: Soft, non-tender, non-distended. Bowel sounds present. No organomegaly or mass.  EXTREMITIES: No pedal edema, cyanosis, or clubbing.  NEUROLOGIC: Cranial nerves II through XII are intact. Muscle strength 5/5 in all extremities. Sensation intact. Gait not checked.  PSYCHIATRIC: The patient is alert and oriented x 3.  SKIN: No obvious rash, lesion, or ulcer.   DATA REVIEW:   CBC  Recent Labs Lab 08/23/15 0420  WBC 6.6  HGB 12.8  HCT 37.4  PLT 255    Chemistries   Recent Labs Lab 08/23/15 0420  NA 139  K 3.8  CL 105  CO2 28  GLUCOSE 108*  BUN 22*  CREATININE 0.89  CALCIUM 9.1    Cardiac Enzymes  Recent Labs Lab 08/22/15 1757  TROPONINI <0.03    Microbiology Results  No results found for this or any previous visit.  RADIOLOGY:  Dg Chest 2 View  Result Date: 08/22/2015 CLINICAL DATA:  A small valuation for acute chest pain. EXAM: CHEST  2 VIEW COMPARISON:  Prior radiograph from 10/08/2014. FINDINGS: Cardiac and mediastinal silhouettes are stable in size and contour, and remain within normal limits. Lungs are normally inflated. Scattered linear opacities within the left lung base and right perihilar region most compatible with atelectasis. No consolidative airspace disease. No pulmonary edema or pleural effusion. No pneumothorax. No acute osseous abnormality. IMPRESSION: Scattered bibasilar atelectatic changes. No other active cardiopulmonary disease identified. Electronically Signed   By: Jeannine Boga M.D.   On: 08/22/2015 05:53   Ct Angio Chest Aorta W/cm &/or Wo/cm  Result Date: 08/22/2015 CLINICAL DATA:  55 year old female with a history of back pain EXAM: CT ANGIOGRAPHY CHEST WITH CONTRAST TECHNIQUE: Multidetector CT imaging of the chest was performed using the standard protocol during bolus administration of intravenous contrast. Multiplanar CT image reconstructions and MIPs were obtained to evaluate the vascular anatomy. CONTRAST:   75 cc Isovue 370 COMPARISON:  None. FINDINGS: Chest: Unremarkable appearance of the chest superficial soft tissues. No axillary or supraclavicular adenopathy. Unremarkable appearance of the thoracic inlet, including the visualized thyroid. Several mediastinal lymph nodes, none  of which are enlarged by CT size criteria or have suspicious features. Unremarkable appearance of the esophagus. Unremarkable course caliber and contour of the thoracic aorta without dissection flap, aneurysm, periaortic fluid. No central, lobar, segmental, or proximal subsegmental filling defects to indicate pulmonary emboli. Heart size within normal limits without pericardial fluid/ thickening. Dense calcifications in the distribution of the left anterior descending, circumflex coronary arteries. No confluent airspace disease.  No pleural effusion or pneumothorax. Geographic ground-glass opacity throughout the lungs. Basilar atelectasis/scarring. Hiatal hernia. Upper abdomen: Unremarkable appearance of the visualized upper abdomen. Review of the MIP images confirms the above findings. IMPRESSION: Study is negative for pulmonary emboli. Two vessel coronary artery disease. Nonspecific geographic ground-glass opacity of the lungs, which can be seen with small airway disease or small vessel disease. Recommend correlation with a history of smoking. Signed, Dulcy Fanny. Earleen Newport, DO [facility] Electronically Signed   By: Corrie Mckusick D.O.   On: 08/22/2015 07:48    EKG:   Orders placed or performed during the hospital encounter of 08/22/15  . ED EKG within 10 minutes  . ED EKG within 10 minutes  . EKG 12-Lead  . EKG 12-Lead      Management plans discussed with the patient, family and they are in agreement.  CODE STATUS:     Code Status Orders        Start     Ordered   08/22/15 1141  Full code  Continuous     08/22/15 1141    Code Status History    Date Active Date  Inactive Code Status Order ID Comments User Context   08/22/2015 11:41 AM 08/23/2015  9:07 AM Full Code [id number]  Vaughan Basta, MD Inpatient   10/08/2014  8:16 PM 10/10/2014  3:06 PM Full Code [id number]  Epifanio Lesches, MD ED      TOTAL TIME TAKING CARE OF THIS PATIENT: 35 minutes.    Vaughan Basta M.D on 08/23/2015 at 11:41 AM  Between 7am to 6pm - Pager - [phone]  After 6pm go to [email or web address] - password [email or web address] [facility]  Office  [phone]  CC: Primary care physician; Albina Billet, MD   Note: This dictation was prepared with Dragon dictation along with smaller phrase technology. Any transcriptional errors that result from this process are unintentional.

## 2015-10-27 IMAGING — US US ABDOMEN LIMITED
1 series · 14 of 25 positions shown · non-contrast
Comparison: 10/03/2011 CT

CLINICAL DATA: 53-year-old female with right upper quadrant
abdominal pain for 3 days.

EXAM:
US ABDOMEN LIMITED - RIGHT UPPER QUADRANT

[Series 1: us abdomen limited · 0.20mm/px · 14 of 68 slices shown]
[im 1/68]
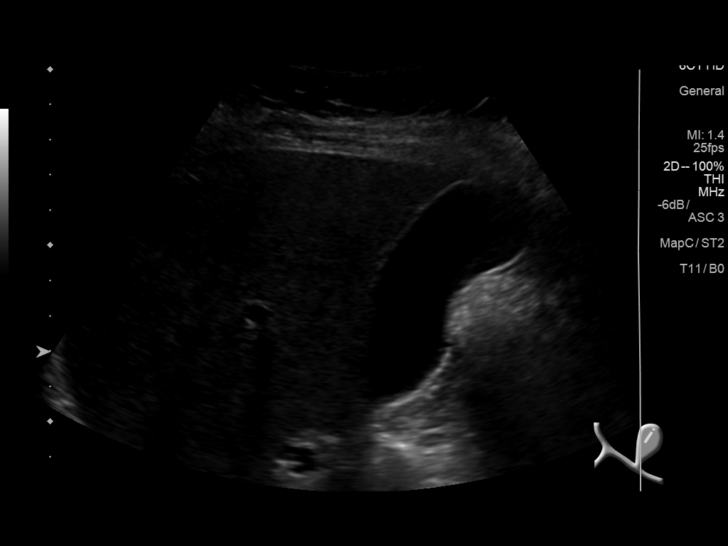
[im 6/68]
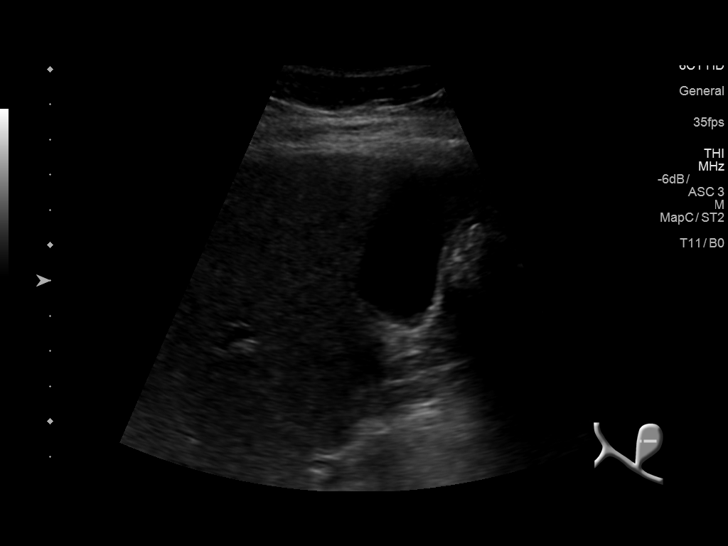
[im 12/68]
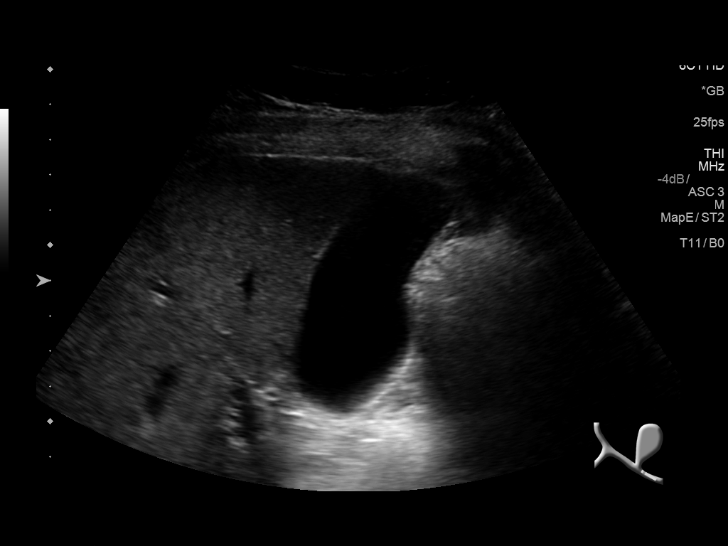
[im 17/68]
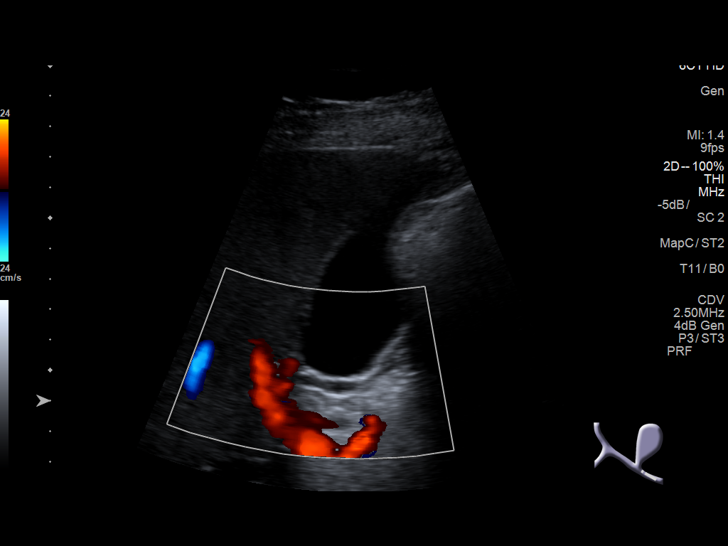
[im 23/68]
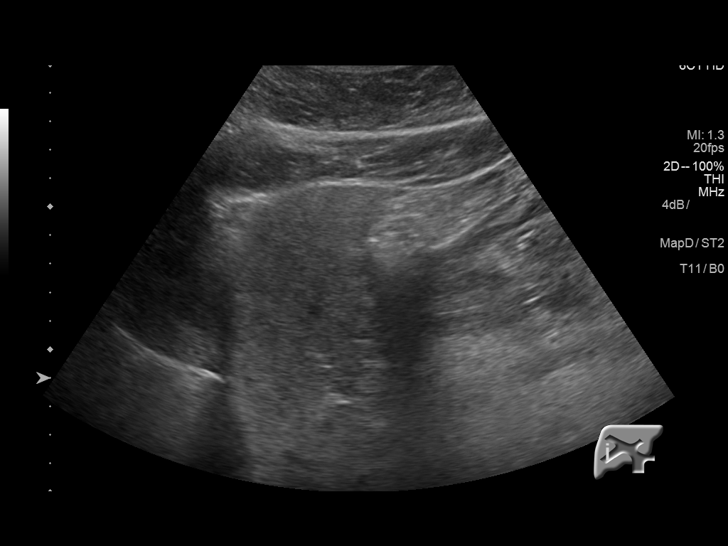
[im 26/68]
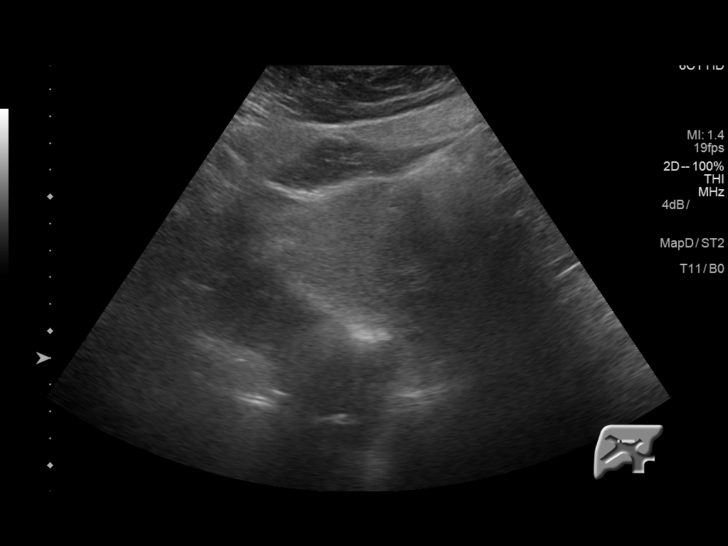
[im 31/68]
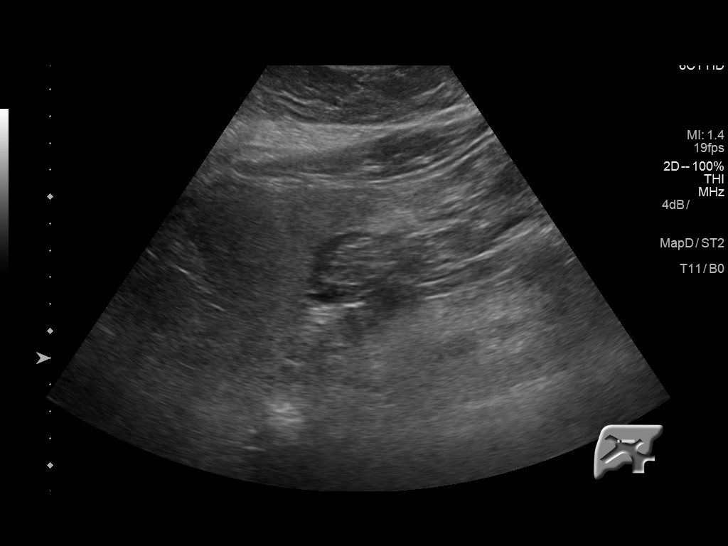
[im 37/68]
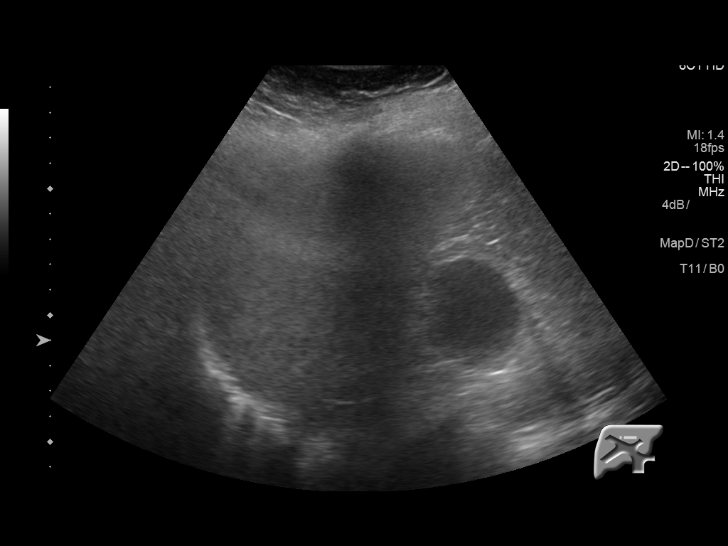
[im 42/68]
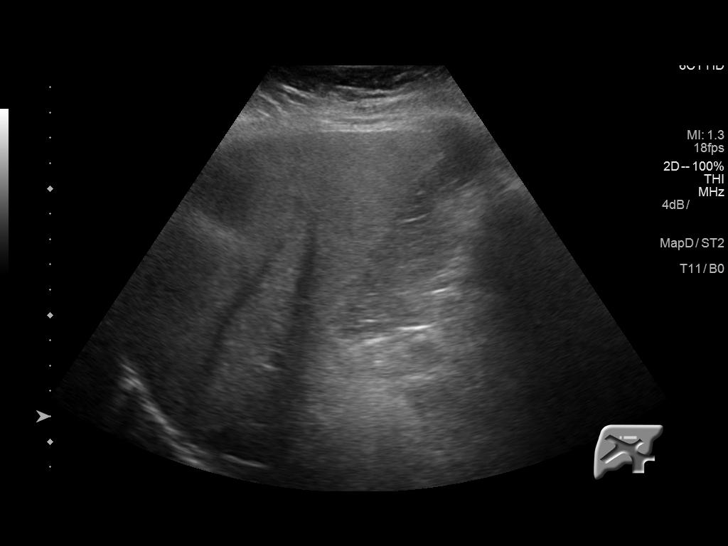
[im 45/68]
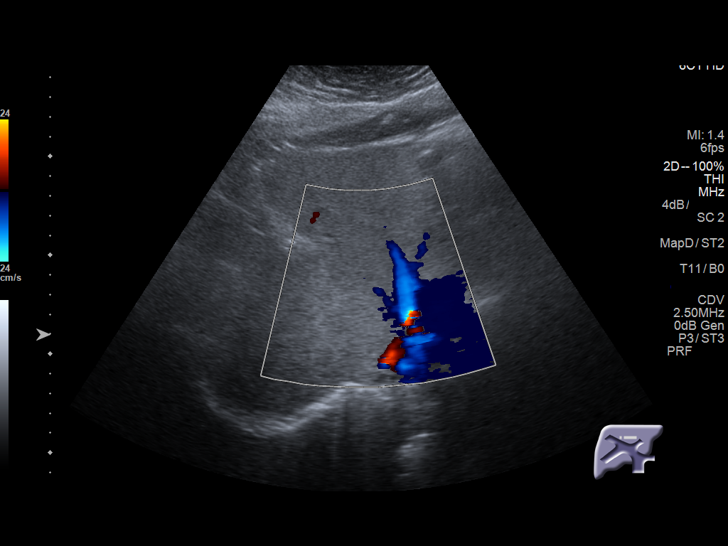
[im 51/68]
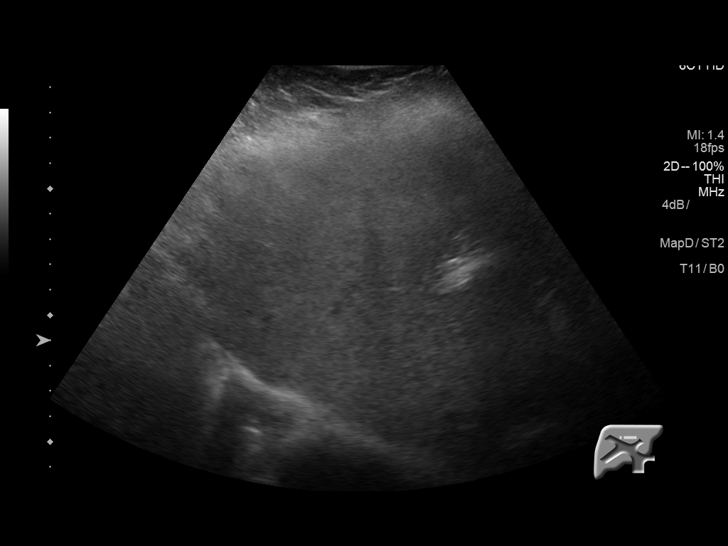
[im 56/68]
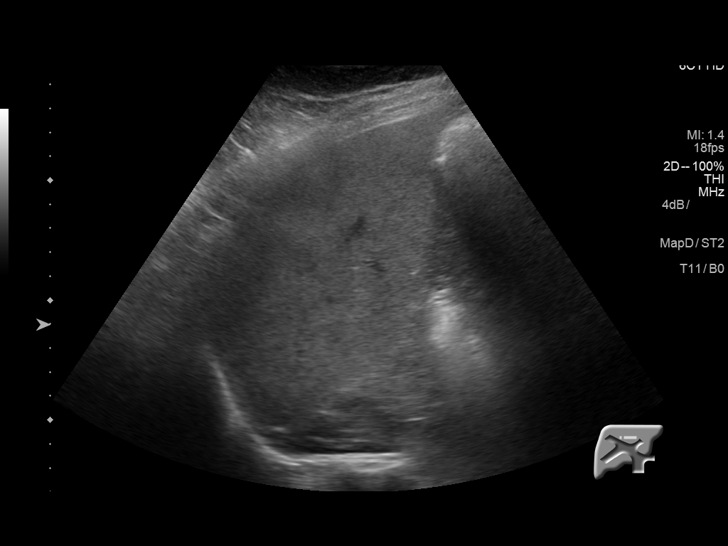
[im 62/68]
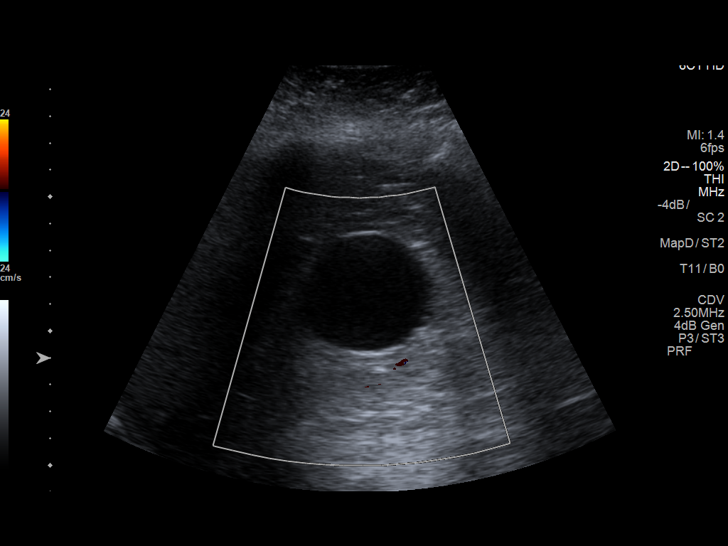
[im 68/68]
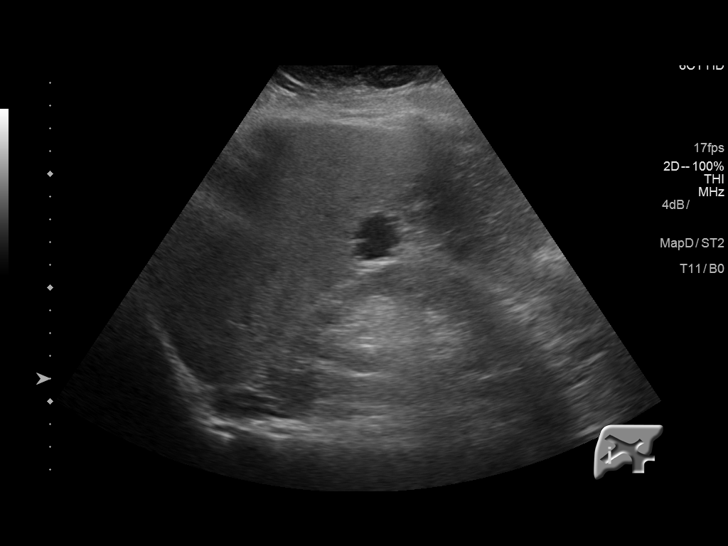

[14 of 25 positions shown; findings below may reference images not displayed]

FINDINGS: Gallbladder:

The gallbladder is unremarkable. There is no evidence of
cholelithiasis or acute cholecystitis.

Common bile duct:

Diameter: 4.1 mm. There is no evidence of intrahepatic or
extrahepatic biliary dilatation.

Liver:

Mild increased echogenicity of the liver may represent hepatic
steatosis. No focal hepatic abnormalities are identified.

A right renal cyst is again noted.
IMPRESSION: Question mild hepatic steatosis.

No evidence of cholelithiasis or acute cholecystitis.

## 2015-12-20 ENCOUNTER — Encounter: Payer: Self-pay | Admitting: Emergency Medicine

## 2015-12-20 ENCOUNTER — Emergency Department: Payer: BC Managed Care – PPO

## 2015-12-20 ENCOUNTER — Observation Stay
Admission: EM | Admit: 2015-12-20 | Discharge: 2015-12-21 | Disposition: A | Discharge status: Home / Self Care | Payer: BC Managed Care – PPO | Attending: Internal Medicine | Admitting: Internal Medicine

## 2015-12-20 DIAGNOSIS — F419 Anxiety disorder, unspecified: Secondary | ICD-10-CM | POA: Diagnosis not present

## 2015-12-20 DIAGNOSIS — M549 Dorsalgia, unspecified: Secondary | ICD-10-CM | POA: Insufficient documentation

## 2015-12-20 DIAGNOSIS — R079 Chest pain, unspecified: Principal | ICD-10-CM | POA: Insufficient documentation

## 2015-12-20 DIAGNOSIS — R0789 Other chest pain: Secondary | ICD-10-CM | POA: Diagnosis present

## 2015-12-20 DIAGNOSIS — E7800 Pure hypercholesterolemia, unspecified: Secondary | ICD-10-CM | POA: Insufficient documentation

## 2015-12-20 DIAGNOSIS — E785 Hyperlipidemia, unspecified: Secondary | ICD-10-CM | POA: Diagnosis not present

## 2015-12-20 DIAGNOSIS — I2510 Atherosclerotic heart disease of native coronary artery without angina pectoris: Secondary | ICD-10-CM | POA: Insufficient documentation

## 2015-12-20 DIAGNOSIS — R10811 Right upper quadrant abdominal tenderness: Secondary | ICD-10-CM | POA: Diagnosis not present

## 2015-12-20 DIAGNOSIS — Z955 Presence of coronary angioplasty implant and graft: Secondary | ICD-10-CM | POA: Diagnosis not present

## 2015-12-20 DIAGNOSIS — I110 Hypertensive heart disease with heart failure: Secondary | ICD-10-CM | POA: Diagnosis not present

## 2015-12-20 DIAGNOSIS — Z7982 Long term (current) use of aspirin: Secondary | ICD-10-CM | POA: Diagnosis not present

## 2015-12-20 DIAGNOSIS — J45909 Unspecified asthma, uncomplicated: Secondary | ICD-10-CM | POA: Insufficient documentation

## 2015-12-20 DIAGNOSIS — Z79899 Other long term (current) drug therapy: Secondary | ICD-10-CM | POA: Diagnosis not present

## 2015-12-20 DIAGNOSIS — I509 Heart failure, unspecified: Secondary | ICD-10-CM | POA: Diagnosis not present

## 2015-12-20 DIAGNOSIS — I200 Unstable angina: Secondary | ICD-10-CM

## 2015-12-20 DIAGNOSIS — G8929 Other chronic pain: Secondary | ICD-10-CM | POA: Diagnosis not present

## 2015-12-20 DIAGNOSIS — I252 Old myocardial infarction: Secondary | ICD-10-CM | POA: Insufficient documentation

## 2015-12-20 DIAGNOSIS — Z8674 Personal history of sudden cardiac arrest: Secondary | ICD-10-CM | POA: Diagnosis not present

## 2015-12-20 DIAGNOSIS — K219 Gastro-esophageal reflux disease without esophagitis: Secondary | ICD-10-CM | POA: Diagnosis not present

## 2015-12-20 DIAGNOSIS — R110 Nausea: Secondary | ICD-10-CM | POA: Insufficient documentation

## 2015-12-20 DIAGNOSIS — Z8249 Family history of ischemic heart disease and other diseases of the circulatory system: Secondary | ICD-10-CM | POA: Diagnosis not present

## 2015-12-20 HISTORY — DX: Personal history of sudden cardiac arrest: Z86.74

## 2015-12-20 HISTORY — DX: Acute myocardial infarction, unspecified: I21.9

## 2015-12-20 LAB — BASIC METABOLIC PANEL
Anion gap: 6 (ref 5–15)
BUN: 12 mg/dL (ref 6–20)
CO2: 24 mmol/L (ref 22–32)
Calcium: 9.3 mg/dL (ref 8.9–10.3)
Chloride: 109 mmol/L (ref 101–111)
Creatinine, Ser: 0.82 mg/dL (ref 0.44–1.00)
GFR calc Af Amer: >60 mL/min (ref >60)
GFR calc non Af Amer: >60 mL/min (ref >60)
Glucose, Bld: 133 mg/dL — ABNORMAL HIGH (ref 65–99)
Potassium: 3.7 mmol/L (ref 3.5–5.1)
Sodium: 139 mmol/L (ref 135–145)

## 2015-12-20 LAB — CBC WITH DIFFERENTIAL/PLATELET
Basophils Absolute: 0.1 10*3/uL (ref 0–0.1)
Basophils Relative: 1 %
Eosinophils Absolute: 0.1 10*3/uL (ref 0–0.7)
Eosinophils Relative: 2 %
HCT: 38.4 % (ref 35.0–47.0)
Hemoglobin: 12.8 g/dL (ref 12.0–16.0)
Lymphocytes Relative: 28 %
Lymphs Abs: 2.2 10*3/uL (ref 1.0–3.6)
MCH: 28.6 pg (ref 26.0–34.0)
MCHC: 33.4 g/dL (ref 32.0–36.0)
MCV: 85.5 fL (ref 80.0–100.0)
Monocytes Absolute: 0.7 10*3/uL (ref 0.2–0.9)
Monocytes Relative: 9 %
Neutro Abs: 4.7 10*3/uL (ref 1.4–6.5)
Neutrophils Relative %: 60 %
Platelets: 299 10*3/uL (ref 150–440)
RBC: 4.49 MIL/uL (ref 3.80–5.20)
RDW: 15.9 % — ABNORMAL HIGH (ref 11.5–14.5)
WBC: 7.8 10*3/uL (ref 3.6–11.0)

## 2015-12-20 LAB — TROPONIN I: Troponin I: <0.03 ng/mL (ref <0.03)

## 2015-12-20 MED ORDER — LEVOFLOXACIN 500 MG PO TABS
750.0000 mg | ORAL_TABLET | Freq: Every day | ORAL | Status: DC
  Administered 2015-12-21: 750 mg via ORAL
  Filled 2015-12-20: qty 2

## 2015-12-20 MED ORDER — RANOLAZINE ER 500 MG PO TB12
500.0000 mg | ORAL_TABLET | Freq: Two times a day (BID) | ORAL | Status: DC
  Administered 2015-12-20 – 2015-12-21 (×2): 500 mg via ORAL
  Filled 2015-12-20 (×2): qty 1

## 2015-12-20 MED ORDER — HYDROCODONE-ACETAMINOPHEN 5-325 MG PO TABS
1.0000 | ORAL_TABLET | Freq: Three times a day (TID) | ORAL | Status: DC | PRN
  Administered 2015-12-21: 1 via ORAL
  Filled 2015-12-20: qty 1

## 2015-12-20 MED ORDER — ONDANSETRON HCL 4 MG PO TABS: 4.0000 mg | ORAL_TABLET | Freq: Four times a day (QID) | ORAL | Status: DC | PRN

## 2015-12-20 MED ORDER — LORAZEPAM 1 MG PO TABS
1.0000 mg | ORAL_TABLET | Freq: Two times a day (BID) | ORAL | Status: DC
  Administered 2015-12-20 – 2015-12-21 (×2): 1 mg via ORAL
  Filled 2015-12-20 (×2): qty 1

## 2015-12-20 MED ORDER — CARVEDILOL 3.125 MG PO TABS
3.1250 mg | ORAL_TABLET | Freq: Two times a day (BID) | ORAL | Status: DC
  Administered 2015-12-21: 3.125 mg via ORAL
  Filled 2015-12-20: qty 1

## 2015-12-20 MED ORDER — PANTOPRAZOLE SODIUM 20 MG PO TBEC: 20.0000 mg | DELAYED_RELEASE_TABLET | Freq: Two times a day (BID) | ORAL | Status: DC

## 2015-12-20 MED ORDER — ISOSORBIDE MONONITRATE ER 30 MG PO TB24
15.0000 mg | ORAL_TABLET | Freq: Two times a day (BID) | ORAL | Status: DC
  Administered 2015-12-20 – 2015-12-21 (×2): 15 mg via ORAL
  Filled 2015-12-20 (×2): qty 1

## 2015-12-20 MED ORDER — DOCUSATE SODIUM 100 MG PO CAPS: 100.0000 mg | ORAL_CAPSULE | Freq: Every day | ORAL | Status: DC | PRN

## 2015-12-20 MED ORDER — ONDANSETRON HCL 4 MG/2ML IJ SOLN
4.0000 mg | Freq: Once | INTRAMUSCULAR | Status: AC
  Administered 2015-12-20: 4 mg via INTRAVENOUS
  Filled 2015-12-20: qty 2

## 2015-12-20 MED ORDER — SODIUM CHLORIDE 0.9% FLUSH
3.0000 mL | Freq: Two times a day (BID) | INTRAVENOUS | Status: DC
  Administered 2015-12-20 – 2015-12-21 (×2): 3 mL via INTRAVENOUS

## 2015-12-20 MED ORDER — DIPHENHYDRAMINE HCL 25 MG PO CAPS
25.0000 mg | ORAL_CAPSULE | Freq: Four times a day (QID) | ORAL | Status: DC | PRN
  Administered 2015-12-20: 25 mg via ORAL
  Filled 2015-12-20: qty 1

## 2015-12-20 MED ORDER — SERTRALINE HCL 50 MG PO TABS: 50.0000 mg | ORAL_TABLET | Freq: Every day | ORAL | Status: DC

## 2015-12-20 MED ORDER — MORPHINE SULFATE (PF) 4 MG/ML IV SOLN
4.0000 mg | Freq: Once | INTRAVENOUS | Status: AC
  Administered 2015-12-20: 4 mg via INTRAVENOUS
  Filled 2015-12-20: qty 1

## 2015-12-20 MED ORDER — MORPHINE SULFATE (PF) 4 MG/ML IV SOLN
4.0000 mg | INTRAVENOUS | Status: DC | PRN
  Administered 2015-12-20: 4 mg via INTRAVENOUS
  Filled 2015-12-20: qty 1

## 2015-12-20 MED ORDER — ONDANSETRON HCL 4 MG/2ML IJ SOLN
4.0000 mg | Freq: Four times a day (QID) | INTRAMUSCULAR | Status: DC | PRN
  Administered 2015-12-20: 4 mg via INTRAVENOUS
  Filled 2015-12-20: qty 2

## 2015-12-20 MED ORDER — ENOXAPARIN SODIUM 40 MG/0.4ML ~~LOC~~ SOLN: 40.0000 mg | SUBCUTANEOUS | Status: DC

## 2015-12-20 MED ORDER — ROSUVASTATIN CALCIUM 10 MG PO TABS
10.0000 mg | ORAL_TABLET | Freq: Every day | ORAL | Status: DC
Start: 2015-12-20 — End: 2015-12-21

## 2015-12-20 MED ORDER — TICAGRELOR 60 MG PO TABS
60.0000 mg | ORAL_TABLET | Freq: Two times a day (BID) | ORAL | Status: DC
  Administered 2015-12-20 – 2015-12-21 (×2): 60 mg via ORAL
  Filled 2015-12-20 (×3): qty 1

## 2015-12-20 MED ORDER — MONTELUKAST SODIUM 10 MG PO TABS
10.0000 mg | ORAL_TABLET | Freq: Every day | ORAL | Status: DC
  Administered 2015-12-20: 10 mg via ORAL
  Filled 2015-12-20: qty 1

## 2015-12-20 MED ORDER — NITROGLYCERIN 0.4 MG SL SUBL: 0.4000 mg | SUBLINGUAL_TABLET | SUBLINGUAL | Status: DC | PRN

## 2015-12-20 MED ORDER — ASPIRIN EC 81 MG PO TBEC
81.0000 mg | DELAYED_RELEASE_TABLET | Freq: Every day | ORAL | Status: DC
  Administered 2015-12-21: 81 mg via ORAL
  Filled 2015-12-20: qty 1

## 2015-12-20 MED ORDER — FAMOTIDINE 20 MG PO TABS
20.0000 mg | ORAL_TABLET | Freq: Two times a day (BID) | ORAL | Status: DC
  Filled 2015-12-20: qty 1

## 2015-12-20 MED ORDER — PANTOPRAZOLE SODIUM 40 MG PO TBEC
40.0000 mg | DELAYED_RELEASE_TABLET | Freq: Two times a day (BID) | ORAL | Status: DC
  Administered 2015-12-20 – 2015-12-21 (×2): 40 mg via ORAL
  Filled 2015-12-20 (×2): qty 1

## 2015-12-20 MED ORDER — DIAZEPAM 5 MG PO TABS: 5.0000 mg | ORAL_TABLET | Freq: Two times a day (BID) | ORAL | Status: DC | PRN

## 2015-12-20 NOTE — ED Provider Notes (Signed)
[facility] Emergency Department Provider Note  ____________________________________________   First MD Initiated Contact with Patient 12/20/15 1822     (approximate)  I have reviewed the triage vital signs and the nursing notes.   HISTORY  Chief Complaint Chest Pain   HPI Brenda Craig is a 55 y.o. female with a history of CAD with multiple stents on Brilinta and aspirin was presenting to the emergency department 2 days of jaw pain and now left back and left arm pain. She says that the back pain feels like a throbbing and is a 7 out of 10. She says that the jaw pain is been constant over the past 2 days no matter what her activity level. She says that it goes away with nitroglycerin. She also says that she had a stress test yesterday and the pain abated during the stress test. She denies any shortness of breath. Denies any nausea or vomiting or diaphoresis. Says that her cardiac pain is been atypical and presents similar to the episode she is having at this time. She also says that she had a catheter about one year ago with 50% stenosis. Said that the pain was abated for about 20 minutes each time with the nitroglycerin. Was given 4 baby aspirin en route by EMS.   Past Medical History:  Diagnosis Date  . Acid reflux 01/17/2013  . Asthma   . Celiac disease   . CHF (congestive heart failure) ([place])   . Coronary artery disease   . Heart trouble   . History of cardiac arrest   . Hypercholesteremia   . Hypertension   . MI (myocardial infarction)     Patient Active Problem List   Diagnosis Date Noted  . Diarrhea   . Chronic cough 03/28/2015  . Colon polyp 03/28/2015  . DU (duodenal ulcer) 03/28/2015  . Hemorrhoid 03/28/2015  . 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 Calculus of kidney 03/28/2015  . Kidney cysts 03/28/2015  . Displacement of lumbar intervertebral disc without myelopathy 02/14/2015  . Angina pectoris ([place])    . CAD in native artery   . Back pain   . S/P coronary artery stent placement   . Hyperlipidemia   . Encounter for preprocedural cardiovascular examination 05/30/2014  . Excess weight 08/11/2013  . Celiac disease 07/22/2013  . Momentary blindness 05/25/2013  . Acid reflux 01/17/2013  . Airway hyperreactivity 01/17/2013  . Anxiety 01/17/2013  . Arteriosclerosis of coronary artery 01/17/2013  . HLD (hyperlipidemia) 01/17/2013  . BP (high blood pressure) 01/17/2013  . Atherosclerosis of coronary artery 10/31/2012  . Essential (primary) hypertension 10/31/2012  . Hypercholesterolemia 10/31/2012  . Presence of stent in anterior descending branch of left coronary artery 10/31/2012  . Presence of stent in coronary artery 10/31/2012    Past Surgical History:  Procedure Laterality Date  . ABLATION    . BACK SURGERY    . HEART STENTS    . OOPHORECTOMY Right     Prior to Admission medications   Medication Sig Start Date End Date Taking? Authorizing Provider  aspirin EC 81 MG tablet Take 81 mg by mouth daily.    Historical Provider, MD  benzonatate (TESSALON) 100 MG capsule Take 100 mg by mouth 3 (three) times daily. 03/22/15   Historical Provider, MD  carvedilol (COREG) 3.125 MG tablet Take 3.125 mg by mouth 2 (two) times daily with a meal.  01/17/13   Historical Provider, MD  diazepam (VALIUM) 5 MG tablet Take  1 tablet (5 mg total) by mouth every 12 (twelve) hours as needed for anxiety. 08/23/15   Vaughan Basta, MD  fluconazole (DIFLUCAN) 150 MG tablet TAKE 1 TABLET BY MOUTH NOW AND MAY REPEAT IN 1 WEEK IF NEEDED 03/09/15   Historical Provider, MD  HYDROcodone-acetaminophen (NORCO/VICODIN) 5-325 MG tablet Take 1 tablet by mouth every 8 (eight) hours as needed. 03/09/15   Historical Provider, MD  isosorbide mononitrate (IMDUR) 30 MG 24 hr tablet 15 mg.  03/26/15   Historical Provider, MD  LORazepam (ATIVAN) 1 MG tablet Take 1 mg by mouth every 4 (four) hours as needed for anxiety.   11/14/12   Historical Provider, MD  Lutein 20 MG CAPS Take 1 capsule by mouth daily.    Historical Provider, MD  montelukast (SINGULAIR) 10 MG tablet Take 10 mg by mouth at bedtime.    Historical Provider, MD  NITROSTAT 0.4 MG SL tablet Place 0.4 mg under the tongue every 5 (five) minutes as needed for chest pain.  11/03/12   Historical Provider, MD  ondansetron (ZOFRAN) 8 MG tablet TAKE 1 TABLET EVERY 8 HOURS AS NEEDED FOR NAUSEA 02/05/15   Historical Provider, MD  pantoprazole (PROTONIX) 20 MG tablet Take 20 mg by mouth 2 (two) times daily.  11/17/12   Historical Provider, MD  ranitidine (ZANTAC) 150 MG tablet Take 150 mg by mouth daily.    Historical Provider, MD  rosuvastatin (CRESTOR) 10 MG tablet Take 10 mg by mouth at bedtime.  10/16/12   Historical Provider, MD  sertraline (ZOLOFT) 50 MG tablet Take 50 mg by mouth at bedtime.  11/17/12   Historical Provider, MD  ticagrelor (BRILINTA) 60 MG TABS tablet Take 60 mg by mouth 2 (two) times daily.  11/06/12   Historical Provider, MD  traMADol (ULTRAM) 50 MG tablet TAKE 1 TABLET BY MOUTH EVERY 6 HOURS AS NEEDED FOR BACK PAIN 01/19/15   Historical Provider, MD  vitamin B-12 (CYANOCOBALAMIN) 1000 MCG tablet Take 1,000 mcg by mouth daily.    Historical Provider, MD  vitamin C (ASCORBIC ACID) 500 MG tablet Take 500 mg by mouth daily.    Historical Provider, MD    Allergies Codeine; Nexium [esomeprazole magnesium]; Omeprazole magnesium; and Percocet [oxycodone-acetaminophen]  Family History  Problem Relation Age of Onset  . CAD Father   . Transient ischemic attack Father     Social History Social History  Substance Use Topics  . Smoking status: Never Smoker  . Smokeless tobacco: Never Used  . Alcohol use Yes     Comment: OCCASIONALLY    Review of Systems Constitutional: No fever/chills Eyes: No visual changes. ENT: No sore throat. Cardiovascular: Denies chest pain. Respiratory: Denies shortness of breath. Gastrointestinal: No abdominal  pain.  No nausea, no vomiting.  No diarrhea.  No constipation. Genitourinary: Negative for dysuria. Musculoskeletal:  As above Skin: Negative for rash. Neurological: Negative for headaches, focal weakness or numbness.  10-point ROS otherwise negative.  ____________________________________________   PHYSICAL EXAM:  VITAL SIGNS: ED Triage Vitals [12/20/15 1825]  Enc Vitals Group     BP 139/90     Pulse Rate 86     Resp 16     Temp 98.9 F (37.2 C)     Temp Source Oral     SpO2 93 %     Weight 198 lb (89.8 kg)     Height 5' 5"  (1.651 m)     Head Circumference      Peak Flow      Pain  Score 8     Pain Loc      Pain Edu?      Excl. in [place]?     Constitutional: Alert and oriented. Well appearing and in no acute distress. Eyes: Conjunctivae are normal. PERRL. EOMI. Head: Atraumatic. Nose: No congestion/rhinnorhea. Mouth/Throat: Mucous membranes are moist.   Neck: No stridor.   Cardiovascular: Normal rate, regular rhythm. Grossly normal heart sounds. Respiratory: Normal respiratory effort.  No retractions. Lungs CTAB. Gastrointestinal: Soft and nontender. No distention. No CVA tenderness. Musculoskeletal: No lower extremity tenderness nor edema.   Neurologic:  Normal speech and language. No gross focal neurologic deficits are appreciated. Skin:  Skin is warm, dry and intact. No rash noted. Psychiatric: Mood and affect are normal. Speech and behavior are normal.  ____________________________________________   LABS (all labs ordered are listed, but only abnormal results are displayed)  Labs Reviewed  CBC WITH DIFFERENTIAL/PLATELET  BASIC METABOLIC PANEL  TROPONIN I   ____________________________________________  EKG  ED ECG REPORT I, [place],  Youlanda Roys, the attending physician, personally viewed and interpreted this ECG.   Date: 12/20/2015  EKG Time: 1820  Rate: 85  Rhythm: normal sinus rhythm  Axis: Normal axis  Intervals:none  ST&T Change: No ST segment  elevation or depression. No abnormal T-wave inversion.  ____________________________________________  RADIOLOGY  DG Chest 1 View (Final result)  Result time 12/20/15 18:59:37  Final result by Etheleen Mayhew, MD (12/20/15 18:59:37)           Narrative:   CLINICAL DATA: 55 year old female with history of trauma pain for 2-3 days. Chest pain today. Some associated shortness of breath, which is now resolved.  EXAM: CHEST 1 VIEW  COMPARISON: Chest x-ray 08/22/2015.  FINDINGS: Low lung volumes. No acute consolidative airspace disease. Linear scarring in the left lung base is unchanged. No pleural effusions. No evidence of pulmonary edema. Heart size is normal. Upper mediastinal contours are within normal limits. No pneumothorax.  IMPRESSION: 1. Low lung volumes without radiographic evidence of acute cardiopulmonary disease. T   Electronically Signed By: Vinnie Langton M.D. On: 12/20/2015 18:59          ____________________________________________   PROCEDURES  Procedure(s) performed:   Procedures  Critical Care performed:   ____________________________________________   INITIAL IMPRESSION / ASSESSMENT AND PLAN / ED COURSE  Pertinent labs & imaging results that were available during my care of the patient were reviewed by me and considered in my medical decision making (see chart for details).   Clinical Course   ----------------------------------------- 7:56 PM on 12/20/2015 -----------------------------------------  Patient resting comfortably at this time. No distress. After morphine no she says she feels "about the same." I asked her if she liked her pain controlled time and she says no. Because of her risk factors in the past as well as atypical presentation for ACS will be admitted to the hospital. She is understanding of this plan and willing to comply. Signed out to Dr. Margaretmary Eddy.     ____________________________________________   FINAL  CLINICAL IMPRESSION(S) / ED DIAGNOSES  Unstable angina.   NEW MEDICATIONS STARTED DURING THIS VISIT:  New Prescriptions   No medications on file     Note:  This document was prepared using Dragon voice recognition software and may include unintentional dictation errors.    Orbie Pyo, MD 12/20/15 [id number]

## 2015-12-20 NOTE — H&P (Signed)
[facility] NAME: Brenda Craig    MR#:  [id number]  DATE OF BIRTH:  [birth year]  DATE OF ADMISSION:  12/20/2015  PRIMARY CARE PHYSICIAN: Albina Billet, MD   REQUESTING/REFERRING PHYSICIAN: Clearnce Hasten, MD  CHIEF COMPLAINT:   Chest pain HISTORY OF PRESENT ILLNESS:  Brenda Craig  is a 55 y.o. female with a known history of CAD, 2 stents, on Brilinta,  Sees dr.PM Manuella Ghazi In [place] is presenting to the emergency department with left-sided chest pain radiating to the left-sided drop. Patient is reporting the pain is throbbing in nature and 7 out of 10. Patient just had nuclear stress test done at [facility] by her cardiac primary cardiology yesterday and thinks the results are normal but not quite sure.Patient underwent cardiac catheterization on 10/18/2014 showed patent circumflex and LAD stents and 50% LAD stenosis with negative FFR. 2-D echo 08/23/2015 at [facility]-ejection fraction 60-65%. Normal systolic and diastolic function. No pulmonary hypertension. Patient is reporting chest pain on the left side of the chest during my examination and improved after giving IV morphine. Patient was seen by Dr. Candis Musa in the past when she got admitted to the hospital in August 2017. Patient is on Ranexa  . Here in the emergency department initial troponin is negative and EKG with no acute ST-T wave changes   PAST MEDICAL HISTORY:   Past Medical History:  Diagnosis Date  . Acid reflux 01/17/2013  . Asthma   . Celiac disease   . CHF (congestive heart failure) ([place])   . Coronary artery disease   . Heart trouble   . History of cardiac arrest   . Hypercholesteremia   . Hypertension   . MI (myocardial infarction)     PAST SURGICAL HISTOIRY:   Past Surgical History:  Procedure Laterality Date  . ABLATION    . BACK SURGERY    . HEART STENTS    . OOPHORECTOMY Right     SOCIAL HISTORY:   Social History  Substance Use Topics  .  Smoking status: Never Smoker  . Smokeless tobacco: Never Used  . Alcohol use Yes     Comment: OCCASIONALLY    FAMILY HISTORY:   Family History  Problem Relation Age of Onset  . CAD Father   . Transient ischemic attack Father     DRUG ALLERGIES:   Allergies  Allergen Reactions  . Codeine Nausea And Vomiting and Rash  . Nexium [Esomeprazole Magnesium] Palpitations  . Omeprazole Magnesium Palpitations  . Percocet [Oxycodone-Acetaminophen] Itching, Nausea And Vomiting and Rash    REVIEW OF SYSTEMS:  CONSTITUTIONAL: No fever, fatigue or weakness.  EYES: No blurred or double vision.  EARS, NOSE, AND THROAT: No tinnitus or ear pain.  RESPIRATORY: No cough, shortness of breath, wheezing or hemoptysis.  CARDIOVASCULAR: Reporting left-sided chest pain radiating to the left jaw. Denies  orthopnea, edema.  GASTROINTESTINAL: reporting nausea after giving morphine . Deniesvomiting, diarrhea or abdominal pain.  GENITOURINARY: No dysuria, hematuria.  ENDOCRINE: No polyuria, nocturia,  HEMATOLOGY: No anemia, easy bruising or bleeding SKIN: No rash or lesion. MUSCULOSKELETAL: No joint pain or arthritis.   NEUROLOGIC: No tingling, numbness, weakness.  PSYCHIATRY: No anxiety or depression.   MEDICATIONS AT HOME:   Prior to Admission medications   Medication Sig Start Date End Date Taking? Authorizing Provider  aspirin EC 81 MG tablet Take 81 mg by mouth daily.   Yes Historical Provider, MD  carvedilol (COREG) 3.125 MG tablet Take 3.125 mg  by mouth 2 (two) times daily with a meal.  01/17/13  Yes Historical Provider, MD  diazepam (VALIUM) 5 MG tablet Take 1 tablet (5 mg total) by mouth every 12 (twelve) hours as needed for anxiety. 08/23/15  Yes Vaughan Basta, MD  HYDROcodone-acetaminophen (NORCO/VICODIN) 5-325 MG tablet Take 1 tablet by mouth every 8 (eight) hours as needed. 03/09/15  Yes Historical Provider, MD  isosorbide mononitrate (IMDUR) 30 MG 24 hr tablet Take 15 mg by mouth 2  (two) times daily.  03/26/15  Yes Historical Provider, MD  levofloxacin (LEVAQUIN) 750 MG tablet Take 1 tablet by mouth daily. 12/20/15 12/29/15 Yes Historical Provider, MD  LORazepam (ATIVAN) 1 MG tablet Take 1 mg by mouth 2 (two) times daily.  11/14/12  Yes Historical Provider, MD  montelukast (SINGULAIR) 10 MG tablet Take 10 mg by mouth at bedtime.   Yes Historical Provider, MD  NITROSTAT 0.4 MG SL tablet Place 0.4 mg under the tongue every 5 (five) minutes as needed for chest pain.  11/03/12  Yes Historical Provider, MD  pantoprazole (PROTONIX) 20 MG tablet Take 20 mg by mouth 2 (two) times daily.  11/17/12  Yes Historical Provider, MD  RANEXA 500 MG 12 hr tablet Take 1 tablet by mouth 2 (two) times daily. 12/06/15  Yes Historical Provider, MD  ranitidine (ZANTAC) 150 MG tablet Take 150 mg by mouth daily.   Yes Historical Provider, MD  ticagrelor (BRILINTA) 60 MG TABS tablet Take 60 mg by mouth 2 (two) times daily.  11/06/12  Yes Historical Provider, MD  rosuvastatin (CRESTOR) 10 MG tablet Take 10 mg by mouth at bedtime.  10/16/12   Historical Provider, MD  sertraline (ZOLOFT) 50 MG tablet Take 50 mg by mouth at bedtime.  11/17/12   Historical Provider, MD      VITAL SIGNS:  Blood pressure (!) 121/105, pulse 75, temperature 98.9 F (37.2 C), temperature source Oral, resp. rate 12, height 5' 5"  (1.651 m), weight 89.8 kg (198 lb), SpO2 94 %.  PHYSICAL EXAMINATION:  GENERAL:  55 y.o.-year-old patient lying in the bed with no acute distress.  EYES: Pupils equal, round, reactive to light and accommodation. No scleral icterus. Extraocular muscles intact.  HEENT: Head atraumatic, normocephalic. Oropharynx and nasopharynx clear.  NECK:  Supple, no jugular venous distention. No thyroid enlargement, no tenderness.  LUNGS: Normal breath sounds bilaterally, no wheezing, rales,rhonchi or crepitation. No use of accessory muscles of respiration.  CARDIOVASCULAR: S1, S2 normal. No murmurs, rubs, or gallops.   no reproducible  anterior chest wall pain ABDOMEN: Soft, nontender, nondistended. Bowel sounds present. No organomegaly or mass.  EXTREMITIES: No pedal edema, cyanosis, or clubbing.  NEUROLOGIC: Cranial nerves II through XII are intact. Muscle strength 5/5 in all extremities. Sensation intact. Gait not checked.  PSYCHIATRIC: The patient is alert and oriented x 3.  patient is anxious SKIN: No obvious rash, lesion, or ulcer.   LABORATORY PANEL:   CBC  Recent Labs Lab 12/20/15 1843  WBC 7.8  HGB 12.8  HCT 38.4  PLT 299   ------------------------------------------------------------------------------------------------------------------  Chemistries   Recent Labs Lab 12/20/15 1843  NA 139  K 3.7  CL 109  CO2 24  GLUCOSE 133*  BUN 12  CREATININE 0.82  CALCIUM 9.3   ------------------------------------------------------------------------------------------------------------------  Cardiac Enzymes  Recent Labs Lab 12/20/15 1843  TROPONINI <0.03   ------------------------------------------------------------------------------------------------------------------  RADIOLOGY:  Dg Chest 1 View  Result Date: 12/20/2015 CLINICAL DATA:  55 year old female with history of trauma pain for 2-3 days. Chest pain  today. Some associated shortness of breath, which is now resolved. EXAM: CHEST 1 VIEW COMPARISON:  Chest x-ray 08/22/2015. FINDINGS: Low lung volumes. No acute consolidative airspace disease. Linear scarring in the left lung base is unchanged. No pleural effusions. No evidence of pulmonary edema. Heart size is normal. Upper mediastinal contours are within normal limits. No pneumothorax. IMPRESSION: 1. Low lung volumes without radiographic evidence of acute cardiopulmonary disease. T Electronically Signed   By: Vinnie Langton M.D.   On: 12/20/2015 18:59    EKG:   Orders placed or performed during the hospital encounter of 12/20/15  . EKG 12-Lead  . EKG 12-Lead     IMPRESSION AND PLAN:   Brenda Craig  is a 55 y.o. female with a known history of CAD, 2 stents, on Brilinta,  Sees dr.PM Manuella Ghazi In [place] is presenting to the emergency department with left-sided chest pain radiating to the left-sided drop. Patient is reporting the pain is throbbing in nature and 7 out of 10. Patient just had nuclear stress test done at [facility] by her cardiac primary cardiology yesterday and thinks the results are normal but not quite sure.Patient underwent cardiac catheterization on 10/18/2014 showed patent circumflex and LAD stents and 50% LAD stenosis with negative    #Chest pain with history of coronary artery disease status post two-vessel coronary artery stents Admit to telemetry Cycle cardiac biomarkers Recent echocardiogram done in August 2017 with ejection fraction 60-65% and no pulmonary hypertension and normal systolic function Consult cardiology Dr. Donivan Scull group Patient just had a stress test done by primary cardiologist Dr. Kennon Rounds at [place], not sure if she has any abnormalities Continue home medication aspirin, Imdur, Coreg, Crestor, Brilinta and Ranexa Morphine as needed for chest pain if not relieved with nitroglycerin sublingual  #Essential hypertension Continue home medication Coreg  #Hyperlipidemia continue statin and check fasting lipid panel in a.m.  #GERD continue Protonix  #Anxiety continue home medications Zoloft, Valium and Ativan as needed  DVT prophylaxis with subcutaneous Lovenox   All the records are reviewed and case discussed with ED provider. Management plans discussed with the patient, family and they are in agreement.  CODE STATUS: FC, son  Mr.Williams -hcpoa  TOTAL TIME TAKING CARE OF THIS PATIENT: 45  minutes.   Note: This dictation was prepared with Dragon dictation along with smaller phrase technology. Any transcriptional errors that result from this process are unintentional.  Nicholes Mango M.D on 12/20/2015 at 9:08  PM  Between 7am to 6pm - Pager - [phone]  After 6pm go to [email or web address] - password [email or web address] [facility]  Office  [phone]  CC: Primary care physician; Albina Billet, MD

## 2015-12-20 NOTE — ED Triage Notes (Signed)
Pt has had left jaw pain for 2 days radiating up to ear. Has also had pain in left back. History of MI and cardiac arrest. No distress at this time. Pain eased with NTG at home. Has had aspirin today.

## 2015-12-21 DIAGNOSIS — R110 Nausea: Secondary | ICD-10-CM

## 2015-12-21 DIAGNOSIS — R079 Chest pain, unspecified: Secondary | ICD-10-CM

## 2015-12-21 DIAGNOSIS — I25119 Atherosclerotic heart disease of native coronary artery with unspecified angina pectoris: Secondary | ICD-10-CM

## 2015-12-21 LAB — TROPONIN I
Troponin I: <0.03 ng/mL (ref <0.03)
Troponin I: <0.03 ng/mL (ref <0.03)

## 2015-12-21 LAB — COMPREHENSIVE METABOLIC PANEL
ALT: 21 U/L (ref 14–54)
AST: 24 U/L (ref 15–41)
Albumin: 3.7 g/dL (ref 3.5–5.0)
Alkaline Phosphatase: 155 U/L — ABNORMAL HIGH (ref 38–126)
Anion gap: 6 (ref 5–15)
BUN: 12 mg/dL (ref 6–20)
CO2: 27 mmol/L (ref 22–32)
Calcium: 8.9 mg/dL (ref 8.9–10.3)
Chloride: 107 mmol/L (ref 101–111)
Creatinine, Ser: 0.89 mg/dL (ref 0.44–1.00)
GFR calc Af Amer: >60 mL/min (ref >60)
GFR calc non Af Amer: >60 mL/min (ref >60)
Glucose, Bld: 104 mg/dL — ABNORMAL HIGH (ref 65–99)
Potassium: 3.8 mmol/L (ref 3.5–5.1)
Sodium: 140 mmol/L (ref 135–145)
Total Bilirubin: 0.7 mg/dL (ref 0.3–1.2)
Total Protein: 6.3 g/dL — ABNORMAL LOW (ref 6.5–8.1)

## 2015-12-21 LAB — CBC
HCT: 36.8 % (ref 35.0–47.0)
Hemoglobin: 12.3 g/dL (ref 12.0–16.0)
MCH: 28.7 pg (ref 26.0–34.0)
MCHC: 33.3 g/dL (ref 32.0–36.0)
MCV: 86.1 fL (ref 80.0–100.0)
Platelets: 265 10*3/uL (ref 150–440)
RBC: 4.28 MIL/uL (ref 3.80–5.20)
RDW: 16.1 % — ABNORMAL HIGH (ref 11.5–14.5)
WBC: 5.7 10*3/uL (ref 3.6–11.0)

## 2015-12-21 LAB — TSH: TSH: 2.755 u[IU]/mL (ref 0.350–4.500)

## 2015-12-21 MED ORDER — ONDANSETRON 4 MG PO TBDP: 4.0000 mg | ORAL_TABLET | Freq: Three times a day (TID) | ORAL | 0 refills | Status: DC | PRN

## 2015-12-21 MED ORDER — RANEXA 500 MG PO TB12: 1000.0000 mg | ORAL_TABLET | Freq: Two times a day (BID) | ORAL | 0 refills | Status: DC

## 2015-12-21 NOTE — Care Management (Signed)
Brenda Craig is frustrated and desires to go home but wants to know what is going on in her body. She is also worried about her mother because last year to the date, her father died and her mom is dealing with that pain on her own. We prayed together and she shed some tears, but I think she is working through those issues as good as one could expect. I will follow up later.

## 2015-12-21 NOTE — Progress Notes (Signed)
Patient is being discharged to home.  She has appts. For further workup.  She is frustrated that a definitive diagnosis has not been made.  Reviewed upcoming appts and medication changes with her.

## 2015-12-21 NOTE — Consult Note (Signed)
Cardiology Consultation Note  Patient ID: Brenda Craig, MRN: [id number], DOB/AGE: 10-Oct-1960 55 y.o. Admit date: 12/20/2015   Date of Consult: 12/21/2015 Primary Physician: Albina Billet, MD Primary Cardiologist: Dr. Manuella Ghazi, MD [facility]) Requesting Physician: Dr. Margaretmary Eddy, MD  Chief Complaint: Back/jaw/arm pain Reason for Consult: Same  HPI: 56 y.o. female with h/o h/o 2-vessel CAD s.p PCI/stenting to to LCx in 2013 in the setting of an ST elevation MI complicated by Vfib arrest s/p PCI/dtenting to LAD in the setting of unstable angina in 2014, chronic back pain s/p multiple surgeries, celiac disease, HTN, and anxiety who presented to [facility] on 12/12 with upper midline back pain, bilateral jaw pain and left shoulder pain.   She was previously admitted to [facility] in October 2016 for similar upper back pain. She ruled out and was advised to follow up with primary cardiologist. In follow up with her primary cardiologist she underwent repeat cardiac cath on 10/14/2014 that showed patent stents along LAD and LCx with 50% LAD stenosis that was not significant by FFR  Patient was recently admitted in August 2017 for same issue. Ruled out. Had normal echo, was started on Ranexa. She followed up with her primary cardiologist noted continued nonexertional back pain. She reports having undergone a stress test on 12/11 because of her symptoms that she says was normal. Results are not in Care Everywhere.   Patient was at rest on 12/12 when her back, mid-scapular pain returned, lasting for 15 minutes and self resolving. She also noted some bilateral jaw pain and left shoulder pain. Never with exertional pain. BP was noted to be elevated in the 0000000 systolic range. She toook SL NTG x 3 without relief of symptoms. She called her MD who told her to take an extra Coreg. She decided to come to the ED.   Upon the patient's arrival to [facility] they were found to have negative troponin x 2, unremarkable cbc, SCr 0.89,  K+ 3.8, TSH normal. ECG non-acute as below, CXR showed no acute cardiopulmonary disease. She notes jaw pain. Stress test from 12/11 has been requested.    Past Medical History:  Diagnosis Date  . Acid reflux 01/17/2013  . Asthma   . Celiac disease   . CHF (congestive heart failure) ([place])   . Coronary artery disease   . Heart trouble   . History of cardiac arrest   . Hypercholesteremia   . Hypertension   . MI (myocardial infarction)       Most Recent Cardiac Studies: Cardiac Catheterization 2014-11-08: Final results : 1. Clinical : Recurrent chest pains etiology undetermined. 2. Anatomic : Normal LAD and marginal circumflex stents. Mild progression of atherosclerosis about 10% at all focal previous stenosis. Mid LAD has 50% stenosis. Negative FFR for hemodynamic significance. Other stenosis including 30% mid and distal right coronary artery and 30% followed by 50% mid LAD stenosis. Ejection fraction improved 55-60%.  Cardiac Catheterization 03/28/13: FINAL DIAGNOSES: 1. Clinical - Chest pain suggestive of unstable or progressive angina. 2. Anatomical - Progression of coronary artery disease, 70 percent mid left anterior descending stenosis increased to 95 percent. This was decreased to 0 percent with the help of a 16/3.5 millimeter PROMUS Premiere drug-eluting stent, followed by a kissing balloon angioplasty between the first diagonal branch and mid left anterior descending. The distal left anterior descending has mild 30 percent stenosis. The right coronary artery has mild 20 percent stenosis. Circumflex marginal stent is normal. 3. Ejection fraction 60 percent.  Lexiscan Stress Cardiolite  report 10/04/2011: Stress echocardiogram by definitely study-negative for ischemia. Normal ejection fraction of 84%. Negative for ischemia or infarction. 13 minutes total exercise and modified Bruce protocol.  Echo 08/2015:Study Conclusions  - Left ventricle: The cavity size was normal.  Systolic function was   normal. The estimated ejection fraction was in the range of 60%   to 65%. Wall motion was normal; there were no regional wall   motion abnormalities. Left ventricular diastolic function   parameters were normal. - Left atrium: The atrium was normal in size. - Right ventricle: Systolic function was normal. - Pulmonary arteries: Systolic pressure was within the normal   range.  Impressions:  - Normal study.   Surgical History:  Past Surgical History:  Procedure Laterality Date  . ABLATION    . BACK SURGERY    . HEART STENTS    . OOPHORECTOMY Right      Home Meds: Prior to Admission medications   Medication Sig Start Date End Date Taking? Authorizing Provider  aspirin EC 81 MG tablet Take 81 mg by mouth daily.   Yes Historical Provider, MD  carvedilol (COREG) 3.125 MG tablet Take 3.125 mg by mouth 2 (two) times daily with a meal.  01/17/13  Yes Historical Provider, MD  diazepam (VALIUM) 5 MG tablet Take 1 tablet (5 mg total) by mouth every 12 (twelve) hours as needed for anxiety. 08/23/15  Yes Vaughan Basta, MD  HYDROcodone-acetaminophen (NORCO/VICODIN) 5-325 MG tablet Take 1 tablet by mouth every 8 (eight) hours as needed. 03/09/15  Yes Historical Provider, MD  isosorbide mononitrate (IMDUR) 30 MG 24 hr tablet Take 15 mg by mouth 2 (two) times daily.  03/26/15  Yes Historical Provider, MD  levofloxacin (LEVAQUIN) 750 MG tablet Take 1 tablet by mouth daily. 12/20/15 12/29/15 Yes Historical Provider, MD  LORazepam (ATIVAN) 1 MG tablet Take 1 mg by mouth 2 (two) times daily.  11/14/12  Yes Historical Provider, MD  montelukast (SINGULAIR) 10 MG tablet Take 10 mg by mouth at bedtime.   Yes Historical Provider, MD  NITROSTAT 0.4 MG SL tablet Place 0.4 mg under the tongue every 5 (five) minutes as needed for chest pain.  11/03/12  Yes Historical Provider, MD  pantoprazole (PROTONIX) 20 MG tablet Take 20 mg by mouth 2 (two) times daily.  11/17/12  Yes Historical  Provider, MD  RANEXA 500 MG 12 hr tablet Take 1 tablet by mouth 2 (two) times daily. 12/06/15  Yes Historical Provider, MD  ranitidine (ZANTAC) 150 MG tablet Take 150 mg by mouth daily.   Yes Historical Provider, MD  ticagrelor (BRILINTA) 60 MG TABS tablet Take 60 mg by mouth 2 (two) times daily.  11/06/12  Yes Historical Provider, MD  rosuvastatin (CRESTOR) 10 MG tablet Take 10 mg by mouth at bedtime.  10/16/12   Historical Provider, MD  sertraline (ZOLOFT) 50 MG tablet Take 50 mg by mouth at bedtime.  11/17/12   Historical Provider, MD    Inpatient Medications:  . aspirin EC  81 mg Oral Daily  . carvedilol  3.125 mg Oral BID WC  . enoxaparin (LOVENOX) injection  40 mg Subcutaneous Q24H  . famotidine  20 mg Oral BID  . isosorbide mononitrate  15 mg Oral BID  . levofloxacin  750 mg Oral Daily  . LORazepam  1 mg Oral BID  . montelukast  10 mg Oral QHS  . pantoprazole  40 mg Oral BID  . ranolazine  500 mg Oral BID  . rosuvastatin  10 mg  Oral QHS  . sertraline  50 mg Oral QHS  . sodium chloride flush  3 mL Intravenous Q12H  . ticagrelor  60 mg Oral BID     Allergies:  Allergies  Allergen Reactions  . Codeine Nausea And Vomiting and Rash  . Nexium [Esomeprazole Magnesium] Palpitations  . Omeprazole Magnesium Palpitations  . Percocet [Oxycodone-Acetaminophen] Itching, Nausea And Vomiting and Rash    Social History   Social History  . Marital status: Divorced    Spouse name: N/A  . Number of children: N/A  . Years of education: N/A   Occupational History  . Not on file.   Social History Main Topics  . Smoking status: Never Smoker  . Smokeless tobacco: Never Used  . Alcohol use Yes     Comment: OCCASIONALLY  . Drug use: No  . Sexual activity: Not Currently   Other Topics Concern  . Not on file   Social History Narrative  . No narrative on file     Family History  Problem Relation Age of Onset  . CAD Father   . Transient ischemic attack Father      Review of  Systems: Review of Systems  Constitutional: Positive for malaise/fatigue. Negative for chills, diaphoresis, fever and weight loss.  HENT: Negative for congestion.   Eyes: Negative for discharge and redness.  Respiratory: Negative for cough, hemoptysis, sputum production, shortness of breath and wheezing.   Cardiovascular: Negative for chest pain, palpitations, orthopnea, claudication, leg swelling and PND.  Gastrointestinal: Positive for nausea. Negative for abdominal pain, blood in stool, heartburn, melena and vomiting.  Genitourinary: Negative for hematuria.  Musculoskeletal: Positive for back pain and joint pain. Negative for falls and myalgias.  Skin: Negative for rash.  Neurological: Positive for weakness. Negative for dizziness, tingling, tremors, sensory change, speech change, focal weakness and loss of consciousness.  Endo/Heme/Allergies: Does not bruise/bleed easily.  Psychiatric/Behavioral: Negative for substance abuse. The patient is nervous/anxious.   All other systems reviewed and are negative.   Labs:  Recent Labs  12/20/15 1843 12/21/15 0054 12/21/15 0625  TROPONINI <0.03 <0.03 <0.03   Lab Results  Component Value Date   WBC 5.7 12/21/2015   HGB 12.3 12/21/2015   HCT 36.8 12/21/2015   MCV 86.1 12/21/2015   PLT 265 12/21/2015    Recent Labs Lab 12/21/15 0625  NA 140  K 3.8  CL 107  CO2 27  BUN 12  CREATININE 0.89  CALCIUM 8.9  PROT 6.3*  BILITOT 0.7  ALKPHOS 155*  ALT 21  AST 24  GLUCOSE 104*   Lab Results  Component Value Date   CHOL 106 08/22/2015   HDL 42 08/22/2015   LDLCALC 43 08/22/2015   TRIG 107 08/22/2015   No results found for: DDIMER  Radiology/Studies:  Dg Chest 1 View  Result Date: 12/20/2015 CLINICAL DATA:  55 year old female with history of trauma pain for 2-3 days. Chest pain today. Some associated shortness of breath, which is now resolved. EXAM: CHEST 1 VIEW COMPARISON:  Chest x-ray 08/22/2015. FINDINGS: Low lung volumes.  No acute consolidative airspace disease. Linear scarring in the left lung base is unchanged. No pleural effusions. No evidence of pulmonary edema. Heart size is normal. Upper mediastinal contours are within normal limits. No pneumothorax. IMPRESSION: 1. Low lung volumes without radiographic evidence of acute cardiopulmonary disease. T Electronically Signed   By: Vinnie Langton M.D.   On: 12/20/2015 18:59    EKG: Interpreted by me showed: NSR, 85 bpm, left axis  deviation, no acute st/t changes  Telemetry: Interpreted by me showed: NSR, 80's to 90's  Weights: Filed Weights   12/20/15 1825 12/20/15 2232  Weight: 198 lb (89.8 kg) 200 lb 6.4 oz (90.9 kg)     Physical Exam: Blood pressure 101/67, pulse 94, temperature 98.6 F (37 C), temperature source Oral, resp. rate 18, height 5\' 4"  (1.626 m), weight 200 lb 6.4 oz (90.9 kg), SpO2 92 %. Body mass index is 34.4 kg/m. General: Well developed, well nourished, in no acute distress. Head: Normocephalic, atraumatic, sclera non-icteric, no xanthomas, nares are without discharge.  Neck: Negative for carotid bruits. JVD not elevated. Lungs: Clear bilaterally to auscultation without wheezes, rales, or rhonchi. Breathing is unlabored. Heart: RRR with S1 S2. No murmurs, rubs, or gallops appreciated. Abdomen: Soft, non-tender, non-distended with normoactive bowel sounds. No hepatomegaly. No rebound/guarding. No obvious abdominal masses. Msk:  Strength and tone appear normal for age. Extremities: No clubbing or cyanosis. No edema. Distal pedal pulses are 2+ and equal bilaterally. Neuro: Alert and oriented X 3. No facial asymmetry. No focal deficit. Moves all extremities spontaneously. Psych:  Responds to questions appropriately with a normal affect.    Assessment and Plan:  Active Problems:   Chest pain    1. Stable angina: -She has ruled out -Reports a recent stress test on 12/11, will fax for these records to review -If this is normal would  not pursue further inpatient evaluation and recommend she follow up with her primary cardiologist -Her pain began around a stressful time in her life as her father passed this tim eof year last year, perhaps there are some psychiatric vs  Undiagnosed fibromyalgia issues in play -Can increase Coreg if BP allows -Continue Imdur, BP precludes further titration  -Increase Ranexa to 1000 mg bid  2. Back pain: -Developed after back surgery -Defer to primary   3. HTN: -Reports elevations on 12/12 into the 99991111 systolic -Improved  4. HLD: -Crestor  5. Anxiety: -Possibly playing a role in the above   Signed, Christell Faith, PA-C [place] Pager: [phone] 12/21/2015, 8:41 AM

## 2015-12-21 NOTE — Progress Notes (Signed)
Discussed lexiscan results with Dr Bosie Clos [facility]) over phone and is negative. They r in agreement with increasing dose of Ranexa to 1000 mg po bid.

## 2015-12-21 NOTE — Discharge Instructions (Signed)
Angina Pectoris Angina pectoris is a very bad feeling in the chest, neck, or arm. Your doctor may call it angina. There are four types of angina. Angina is caused by a lack of blood in the middle and thickest layer of the heart wall (myocardium). Angina may feel like a crushing or squeezing pain in the chest. It may feel like tightness or heavy pressure in the chest. Some people say it feels like gas, heartburn, or indigestion. Some people have symptoms other than pain. These include:  Shortness of breath.  Cold sweats.  Feeling sick to your stomach (nausea).  Feeling light-headed.  Many women have chest discomfort and some of the other symptoms. However, women often have different symptoms, such as:  Feeling tired (fatigue).  Feeling nervous for no reason.  Feeling weak for no reason.  Dizziness or fainting.  Women may have angina without any symptoms. Follow these instructions at home:  Take medicines only as told by your doctor.  Take care of other health issues as told by your doctor. These include: ? High blood pressure (hypertension). ? Diabetes.  Follow a heart-healthy diet. Your doctor can help you to choose healthy food options and make changes.  Talk to your doctor to learn more about healthy cooking methods and use them. These include: ? Roasting. ? Grilling. ? Broiling. ? Baking. ? Poaching. ? Steaming. ? Stir-frying.  Follow an exercise program approved by your doctor.  Keep a healthy weight. Lose weight as told by your doctor.  Rest when you are tired.  Learn to manage stress.  Do not use any tobacco, such as cigarettes, chewing tobacco, or electronic cigarettes. If you need help quitting, ask your doctor.  If you drink alcohol, and your doctor says it is okay, limit yourself to no more than 1 drink per day. One drink equals 12 ounces of beer, 5 ounces of wine, or 1 ounces of hard liquor.  Stop illegal drug use.  Keep all follow-up visits as told  by your doctor. This is important. Do not take these medicines unless your doctor says that you can:  Nonsteroidal anti-inflammatory drugs (NSAIDs). These include: ? Ibuprofen. ? Naproxen. ? Celecoxib.  Vitamin supplements that have vitamin A, vitamin E, or both.  Hormone therapy that contains estrogen with or without progestin.  Get help right away if:  You have pain in your chest, neck, arm, jaw, stomach, or back that: ? Lasts more than a few minutes. ? Comes back. ? Does not get better after you take medicine under your tongue (sublingual nitroglycerin).  You have any of these symptoms for no reason: ? Gas, heartburn, or indigestion. ? Sweating a lot. ? Shortness of breath or trouble breathing. ? Feeling sick to your stomach or throwing up. ? Feeling more tired than usual. ? Feeling nervous or worrying more than usual. ? Feeling weak. ? Diarrhea.  You are suddenly dizzy or light-headed.  You faint or pass out. These symptoms may be an emergency. Do not wait to see if the symptoms will go away. Get medical help right away. Call your local emergency services (911 in the [place] Do not drive yourself to the hospital. This information is not intended to replace advice given to you by your health care provider. Make sure you discuss any questions you have with your health care provider. Document Released: 06/13/2007 Document Revised: 06/02/2015 Document Reviewed: 04/28/2013 Elsevier Interactive Patient Education  2017 Elsevier Inc.  

## 2015-12-24 NOTE — Discharge Summary (Signed)
[facility] NAME: Brenda Craig    MR#:  [id number]  DATE OF BIRTH:  Mar 15, 1960  DATE OF ADMISSION:  12/20/2015   ADMITTING PHYSICIAN: Nicholes Mango, MD  DATE OF DISCHARGE: 12/21/2015  1:03 PM  PRIMARY CARE PHYSICIAN: TATE,DENNY C, MD   ADMISSION DIAGNOSIS:  Unstable angina ([place]) [I20.0] Chest pain [R07.9] DISCHARGE DIAGNOSIS:  Active Problems:   Chest pain  SECONDARY DIAGNOSIS:   Past Medical History:  Diagnosis Date  . Acid reflux 01/17/2013  . Asthma   . Celiac disease   . CHF (congestive heart failure) ([place])   . Coronary artery disease   . Heart trouble   . History of cardiac arrest   . Hypercholesteremia   . Hypertension   . MI (myocardial infarction)    HOSPITAL COURSE:  [name] is a 55 year old woman with history of CAD status post PCI's to the LCx and LAD followed by [facility] cardiology, admitted with jaw pain and neck pain as well as paresthesias involving the left arm. EKG showed no significant abnormalities suggestive of ischemia. Troponin has been negative 3. It should be noted that the patient was recently started on an antibiotic for possible sinusitis; overall she felt worse after starting the antibiotic, including nausea. At this time, she has minimal jaw discomfort and tingling of the left arm.  1. Stable angina: -She has ruled out -Reports a recent stress test on 12/11, which has been reportedly neg per [place] cardiology physician -Her pain began around a stressful time in her life as her father passed this time of year last year, perhaps there are some psychiatric vs  Undiagnosed fibromyalgia issues in play - Cardio recommended to Increase Ranexa to 1000 mg bid  2. Back pain: chronic -Developed after back surgery -outpt eval  3. HTN: -Reports elevations on 12/12 into the 416'L systolic -Improved  4. HLD: -Crestor  5. Anxiety: -Possibly playing a role in the above   6.  Nausea her nausea and upper abdominal tenderness are likely related to recent antibiotic use and/or gastroenteritis. If symptoms persist, right upper quadrant ultrasound as an outpt may be helpful to exclude biliary disease once she complete her Abx (if symptoms persist) DISCHARGE CONDITIONS:  stable CONSULTS OBTAINED:  Treatment Team:  Nelva Bush, MD DRUG ALLERGIES:   Allergies  Allergen Reactions  . Codeine Nausea And Vomiting and Rash  . Nexium [Esomeprazole Magnesium] Palpitations  . Omeprazole Magnesium Palpitations  . Percocet [Oxycodone-Acetaminophen] Itching, Nausea And Vomiting and Rash   DISCHARGE MEDICATIONS:   Allergies as of 12/21/2015      Reactions   Codeine Nausea And Vomiting, Rash   Nexium [esomeprazole Magnesium] Palpitations   Omeprazole Magnesium Palpitations   Percocet [oxycodone-acetaminophen] Itching, Nausea And Vomiting, Rash      Medication List    STOP taking these medications   levofloxacin 750 MG tablet Commonly known as:  LEVAQUIN     TAKE these medications   aspirin EC 81 MG tablet Take 81 mg by mouth daily.   BRILINTA 60 MG Tabs tablet Generic drug:  ticagrelor Take 60 mg by mouth 2 (two) times daily.   carvedilol 3.125 MG tablet Commonly known as:  COREG Take 3.125 mg by mouth 2 (two) times daily with a meal.   CRESTOR 10 MG tablet Generic drug:  rosuvastatin Take 10 mg by mouth at bedtime.   diazepam 5 MG tablet Commonly known as:  VALIUM Take 1 tablet (5 mg total) by  mouth every 12 (twelve) hours as needed for anxiety.   HYDROcodone-acetaminophen 5-325 MG tablet Commonly known as:  NORCO/VICODIN Take 1 tablet by mouth every 8 (eight) hours as needed. Notes to patient:  Your last dose was at 9:20 am   isosorbide mononitrate 30 MG 24 hr tablet Commonly known as:  IMDUR Take 15 mg by mouth 2 (two) times daily.   LORazepam 1 MG tablet Commonly known as:  ATIVAN Take 1 mg by mouth 2 (two) times daily.   montelukast  10 MG tablet Commonly known as:  SINGULAIR Take 10 mg by mouth at bedtime.   NITROSTAT 0.4 MG SL tablet Generic drug:  nitroGLYCERIN Place 0.4 mg under the tongue every 5 (five) minutes as needed for chest pain.   ondansetron 4 MG disintegrating tablet Commonly known as:  ZOFRAN-ODT Take 1 tablet (4 mg total) by mouth every 8 (eight) hours as needed for nausea or vomiting.   pantoprazole 20 MG tablet Commonly known as:  PROTONIX Take 20 mg by mouth 2 (two) times daily.   RANEXA 500 MG 12 hr tablet Generic drug:  ranolazine Take 2 tablets (1,000 mg total) by mouth 2 (two) times daily. What changed:  how much to take   ranitidine 150 MG tablet Commonly known as:  ZANTAC Take 150 mg by mouth daily.   sertraline 50 MG tablet Commonly known as:  ZOLOFT Take 50 mg by mouth at bedtime.        DISCHARGE INSTRUCTIONS:   DIET:  Cardiac diet DISCHARGE CONDITION:  Good ACTIVITY:  Activity as tolerated OXYGEN:  Home Oxygen: No.  Oxygen Delivery: room air DISCHARGE LOCATION:  home   If you experience worsening of your admission symptoms, develop shortness of breath, life threatening emergency, suicidal or homicidal thoughts you must seek medical attention immediately by calling 911 or calling your MD immediately  if symptoms less severe.  You Must read complete instructions/literature along with all the possible adverse reactions/side effects for all the Medicines you take and that have been prescribed to you. Take any new Medicines after you have completely understood and accpet all the possible adverse reactions/side effects.   Please note  You were cared for by a hospitalist during your hospital stay. If you have any questions about your discharge medications or the care you received while you were in the hospital after you are discharged, you can call the unit and asked to speak with the hospitalist on call if the hospitalist that took care of you is not available. Once you  are discharged, your primary care physician will handle any further medical issues. Please note that NO REFILLS for any discharge medications will be authorized once you are discharged, as it is imperative that you return to your primary care physician (or establish a relationship with a primary care physician if you do not have one) for your aftercare needs so that they can reassess your need for medications and monitor your lab values.    On the day of Discharge:  VITAL SIGNS:  Blood pressure 94/63, pulse 85, temperature 98.2 F (36.8 C), temperature source Oral, resp. rate 12, height 5' 4"  (1.626 m), weight 90.9 kg (200 lb 6.4 oz), SpO2 92 %. PHYSICAL EXAMINATION:  GENERAL:  55 y.o.-year-old patient lying in the bed with no acute distress.  EYES: Pupils equal, round, reactive to light and accommodation. No scleral icterus. Extraocular muscles intact.  HEENT: Head atraumatic, normocephalic. Oropharynx and nasopharynx clear.  NECK:  Supple, no jugular venous  distention. No thyroid enlargement, no tenderness.  LUNGS: Normal breath sounds bilaterally, no wheezing, rales,rhonchi or crepitation. No use of accessory muscles of respiration.  CARDIOVASCULAR: S1, S2 normal. No murmurs, rubs, or gallops.  ABDOMEN: Soft, non-tender, non-distended. Bowel sounds present. No organomegaly or mass.  EXTREMITIES: No pedal edema, cyanosis, or clubbing.  NEUROLOGIC: Cranial nerves II through XII are intact. Muscle strength 5/5 in all extremities. Sensation intact. Gait not checked.  PSYCHIATRIC: The patient is alert and oriented x 3.  SKIN: No obvious rash, lesion, or ulcer.  DATA REVIEW:   CBC  Recent Labs Lab 12/21/15 0625  WBC 5.7  HGB 12.3  HCT 36.8  PLT 265    Chemistries   Recent Labs Lab 12/21/15 0625  NA 140  K 3.8  CL 107  CO2 27  GLUCOSE 104*  BUN 12  CREATININE 0.89  CALCIUM 8.9  AST 24  ALT 21  ALKPHOS 155*  BILITOT 0.7    Follow-up Information    TATE,DENNY C, MD.  Schedule an appointment as soon as possible for a visit in 1 week(s).   Specialty:  Internal Medicine Why:  Wednesday, December 20th at 315pm, Patient will need referral to Dr Jennings Books, Neurology, ccs Contact information: [address] [place] [address] [phone]        [facility], Janann Colonel, MD. Schedule an appointment as soon as possible for a visit in 2 week(s).   Specialty:  Cardiology Why:  The office will call you with an appointment, ccs Contact information: [place] [address] [place] [address] [phone]        [facility], MD. Schedule an appointment as soon as possible for a visit in 2 week(s).   Specialty:  Neurology Why:  You will need a referral from Dr Hall Busing, ccs Contact information: [place] [facility] [place] [address] [phone]           Management plans discussed with the patient, family and they are in agreement.  CODE STATUS: FULL CODE  TOTAL TIME TAKING CARE OF THIS PATIENT: 45 minutes.    Max Sane M.D on 12/24/2015 at 7:33 AM  Between 7am to 6pm - Pager - [phone]  After 6pm go to [email or web address] - Proofreader  [facility]  Office  [phone]  CC: Primary care physician; Albina Billet, MD   Note: This dictation was prepared with Dragon dictation along with smaller phrase technology. Any transcriptional errors that result from this process are unintentional.

## 2015-12-28 LAB — HM PAP SMEAR

## 2016-02-20 ENCOUNTER — Ambulatory Visit
Admission: RE | Admit: 2016-02-20 | Discharge: 2016-02-20 | Disposition: A | Discharge status: Home / Self Care | Payer: BC Managed Care – PPO | Source: Ambulatory Visit | Attending: Internal Medicine | Admitting: Internal Medicine

## 2016-02-20 ENCOUNTER — Other Ambulatory Visit: Payer: Self-pay | Admitting: Internal Medicine

## 2016-02-20 DIAGNOSIS — R05 Cough: Secondary | ICD-10-CM

## 2016-02-20 DIAGNOSIS — R059 Cough, unspecified: Secondary | ICD-10-CM

## 2016-04-04 ENCOUNTER — Other Ambulatory Visit: Payer: Self-pay | Admitting: Obstetrics and Gynecology

## 2016-04-17 ENCOUNTER — Ambulatory Visit
Admission: RE | Admit: 2016-04-17 | Discharge: 2016-04-17 | Disposition: A | Discharge status: Home / Self Care | Payer: BC Managed Care – PPO | Source: Ambulatory Visit | Attending: Internal Medicine | Admitting: Internal Medicine

## 2016-04-17 ENCOUNTER — Telehealth: Payer: Self-pay | Admitting: Pulmonary Disease

## 2016-04-17 ENCOUNTER — Other Ambulatory Visit: Payer: Self-pay | Admitting: Internal Medicine

## 2016-04-17 DIAGNOSIS — S8991XA Unspecified injury of right lower leg, initial encounter: Secondary | ICD-10-CM | POA: Insufficient documentation

## 2016-04-17 DIAGNOSIS — W19XXXA Unspecified fall, initial encounter: Secondary | ICD-10-CM | POA: Insufficient documentation

## 2016-04-17 NOTE — Telephone Encounter (Signed)
Dr. Sondra Come office needs an appointment with Dr. Alva Garnet for persistent cough. Please advise of date and time.

## 2016-04-17 NOTE — Telephone Encounter (Signed)
05/15/16 @ 9am with Sim[place] in a 30 minute slot. Put in notes chronic cough. Thanks.

## 2016-05-15 ENCOUNTER — Encounter: Payer: Self-pay | Admitting: Pulmonary Disease

## 2016-05-15 ENCOUNTER — Ambulatory Visit (INDEPENDENT_AMBULATORY_CARE_PROVIDER_SITE_OTHER): Payer: BC Managed Care – PPO | Admitting: Pulmonary Disease

## 2016-05-15 VITALS — BP 122/62 | HR 78 | Ht 64.0 in | Wt 208.0 lb

## 2016-05-15 DIAGNOSIS — J302 Other seasonal allergic rhinitis: Secondary | ICD-10-CM | POA: Diagnosis not present

## 2016-05-15 DIAGNOSIS — R053 Chronic cough: Secondary | ICD-10-CM

## 2016-05-15 DIAGNOSIS — R05 Cough: Secondary | ICD-10-CM

## 2016-05-15 DIAGNOSIS — R058 Other specified cough: Secondary | ICD-10-CM

## 2016-05-15 DIAGNOSIS — K219 Gastro-esophageal reflux disease without esophagitis: Secondary | ICD-10-CM | POA: Diagnosis not present

## 2016-05-15 DIAGNOSIS — J45909 Unspecified asthma, uncomplicated: Secondary | ICD-10-CM | POA: Diagnosis not present

## 2016-05-15 MED ORDER — DEXTROMETHORPHAN-GUAIFENESIN 5-100 MG/5ML PO LIQD: 10.0000 mL | Freq: Two times a day (BID) | ORAL | 10 refills | Status: DC

## 2016-05-15 MED ORDER — CETIRIZINE HCL 10 MG PO TABS: 10.0000 mg | ORAL_TABLET | Freq: Every day | ORAL | 5 refills | Status: DC

## 2016-05-15 MED ORDER — BUDESONIDE-FORMOTEROL FUMARATE 80-4.5 MCG/ACT IN AERO: 2.0000 | INHALATION_SPRAY | Freq: Two times a day (BID) | RESPIRATORY_TRACT | 12 refills | Status: DC

## 2016-05-15 NOTE — Progress Notes (Signed)
PULMONARY CONSULT NOTE  Requesting MD/Service: Benita Stabile Date of initial consultation: 05/15/16 Reason for consultation: Chronic cough  PT PROFILE: 56 y.o. female never smoker referred for chronic cough. Initial impression: all 3 components of cough triad with probable component of cyclical cough. PPI dose increased, ICS/LABA changed, antihistamine and cough suppressant initiated.   HPI:  [name] is referred for evaluation of chronic cough of many years duration. She dates it back to 2009 or 2010. Her cough has been intermittent but has to be worse in the spring autumn and winter. It has been particularly bad in the last couple of years. She reports post tussive emesis on occasion. It tends to be worse during the night and postprandial. She saw pulmonologist at [facility] 2009 and was told that she has asthma. At that time she was started on Singulair with modest improvement. In 2015 she was started on Symbicort which "helped a lot". However, because of the payer issues, she was changed from Symbicort to [facility] which does not seem to control her cough as well. Other triggers include meals, bending over, excessive heat and exercise. She has a diagnosis of gastroesophageal reflux. She presently takes proton Milligrams twice a day and ranitidine at bedtime. She describes moderate exertional dyspnea, limited to a single flight of stairs or a couple of 100 yards on flat ground. She has a prior history of myocardial infarction. She suffered a cardiac arrest in 2013. She does describe nasal congestion and chronic throat irritation.Denies CP, fever, purulent sputum, hemoptysis, LE edema and calf tenderness.    Past Medical History:  Diagnosis Date  . Acid reflux 01/17/2013  . Asthma   . Celiac disease   . CHF (congestive heart failure) ([place])   . Coronary artery disease   . Heart trouble   . History of cardiac arrest   . Hypercholesteremia   . Hypertension   . MI (myocardial  infarction) [facility])     Past Surgical History:  Procedure Laterality Date  . ABLATION    . BACK SURGERY    . HEART STENTS    . OOPHORECTOMY Right     MEDICATIONS: I have reviewed all medications and confirmed regimen as documented  Social History   Social History  . Marital status: Divorced    Spouse name: N/A  . Number of children: N/A  . Years of education: N/A   Occupational History  . Not on file.   Social History Main Topics  . Smoking status: Never Smoker  . Smokeless tobacco: Never Used  . Alcohol use Yes     Comment: OCCASIONALLY  . Drug use: No  . Sexual activity: Not Currently   Other Topics Concern  . Not on file   Social History Narrative  . No narrative on file    Family History  Problem Relation Age of Onset  . CAD Father   . Transient ischemic attack Father     ROS: No fever, myalgias/arthralgias, unexplained weight loss or weight gain No new focal weakness or sensory deficits No otalgia, hearing loss, visual changes No neck pain or adenopathy No abdominal pain, N/V/D, diarrhea, change in bowel pattern No dysuria, change in urinary pattern   Vitals:   05/15/16 0921 05/15/16 0922  BP:  122/62  Pulse:  78  SpO2:  97%  Weight: 208 lb (94.3 kg)   Height: 5' 4"  (1.626 m)      EXAM:  Gen: Moderately obese, No overt respiratory distress, coughing frequently and vigorously  HEENT: NCAT, sclera white, mild rhinitis with mucus crusting, moderate pharyngeal erythema Neck: Supple without LAN, thyromegaly, JVD Lungs: breath sounds full with normal percussion note and no adventitious sounds Cardiovascular: RRR, no murmurs noted Abdomen: Soft, nontender, normal BS Ext: without clubbing, cyanosis, edema Neuro: CNs grossly intact, motor and sensory intact Skin: Limited exam, no lesions noted  DATA:   BMP Latest Ref Rng & Units 12/21/2015 12/20/2015 08/23/2015  Glucose 65 - 99 mg/dL 104(H) 133(H) 108(H)  BUN 6 - 20 mg/dL 12 12 22(H)  Creatinine  0.44 - 1.00 mg/dL 0.89 0.82 0.89  Sodium 135 - 145 mmol/L 140 139 139  Potassium 3.5 - 5.1 mmol/L 3.8 3.7 3.8  Chloride 101 - 111 mmol/L 107 109 105  CO2 22 - 32 mmol/L 27 24 28   Calcium 8.9 - 10.3 mg/dL 8.9 9.3 9.1    CBC Latest Ref Rng & Units 12/21/2015 12/20/2015 08/23/2015  WBC 3.6 - 11.0 K/uL 5.7 7.8 6.6  Hemoglobin 12.0 - 16.0 g/dL 12.3 12.8 12.8  Hematocrit 35.0 - 47.0 % 36.8 38.4 37.4  Platelets 150 - 440 K/uL 265 299 255    CXR (02/20/16):  No acute cardiac or pulmonary processes noted  IMPRESSION:     ICD-9-CM ICD-10-CM   1. Chronic cough 786.2 R05   2. Seasonal allergic rhinitis, unspecified trigger 477.9 J30.2   3. Gastroesophageal reflux disease, esophagitis presence not specified 530.81 K21.9   4. Laryngopharyngeal reflux (LPR) 478.79 K21.9   5. Persistent asthma without complication, unspecified asthma severity 493.90 J45.909   6. Upper airway cough syndrome 786.2 R05    Severe chronic nonproductive cough - with some features of eah of the "cough triad". She has a History of asthma and symptomatic response to ICS/LABA. She has well-documented gastroesophageal reflux disease. She complains of nasal congestion and seasonal allergy symptoms. Lastly, given the witnessed vigorous coughing in the exam room today, I suspect that there is a cyclical component to her cough   PLAN:  1) Continue Singulair - 10 mg daily at bedtime 2) Begin Zyrtec 10 mg each morning - Rx provided 3) Change Protonix to 40 mg twice a day - Rx changed 4) Continue Zantac (ranitidine) at bedtime 5) Stop Breo inhlaer. Begin Symibcort 80-4.5. Two inhalations twice a day. Rinse mouth thoroughly after use - Rx provided 6) Delsym 10 cc twice a day for 5 days, then every 12 hrs as needed. Rx provided 7) Keep throat moist  - sip on cool water or use sugar free lozenges as needed  Follow up in 4-6 weeks with office spirometry at that time   Merton Border, MD PCCM service Mobile [phone] Pager  [phone] 05/15/2016 5:13 PM

## 2016-05-15 NOTE — Patient Instructions (Addendum)
1) Continue Singulair - 10 mg daily at bedtime 2) Begin Zyrtec 10 mg each morning 3) Change Protonix to 40 mg twice a day 4) Continue Zantac (ranitidine) at bedtime 5) Stop Breo inhlaer. Begin Symibcort 80-4.5. Two inhalations twice a day. Rinse mouth thoroughly after use 6) Delsym 10 cc twice a day for 5 days, then every 12 hrs as needed 7) Keep throat moist  - sip on cool water or use sugar free lozenges as needed  Follow up in 4-6 weeks with office spirometry at that time

## 2016-05-21 ENCOUNTER — Emergency Department: Payer: BC Managed Care – PPO

## 2016-05-21 ENCOUNTER — Observation Stay
Admission: EM | Admit: 2016-05-21 | Discharge: 2016-05-22 | Disposition: A | Discharge status: Home / Self Care | Payer: BC Managed Care – PPO | Attending: Internal Medicine | Admitting: Internal Medicine

## 2016-05-21 ENCOUNTER — Encounter: Payer: Self-pay | Admitting: Emergency Medicine

## 2016-05-21 ENCOUNTER — Observation Stay: Payer: BC Managed Care – PPO

## 2016-05-21 DIAGNOSIS — Z79899 Other long term (current) drug therapy: Secondary | ICD-10-CM | POA: Insufficient documentation

## 2016-05-21 DIAGNOSIS — I10 Essential (primary) hypertension: Secondary | ICD-10-CM | POA: Diagnosis present

## 2016-05-21 DIAGNOSIS — M1710 Unilateral primary osteoarthritis, unspecified knee: Secondary | ICD-10-CM

## 2016-05-21 DIAGNOSIS — F419 Anxiety disorder, unspecified: Secondary | ICD-10-CM | POA: Diagnosis not present

## 2016-05-21 DIAGNOSIS — K449 Diaphragmatic hernia without obstruction or gangrene: Secondary | ICD-10-CM | POA: Insufficient documentation

## 2016-05-21 DIAGNOSIS — Z9889 Other specified postprocedural states: Secondary | ICD-10-CM | POA: Diagnosis not present

## 2016-05-21 DIAGNOSIS — R079 Chest pain, unspecified: Secondary | ICD-10-CM

## 2016-05-21 DIAGNOSIS — K219 Gastro-esophageal reflux disease without esophagitis: Secondary | ICD-10-CM | POA: Insufficient documentation

## 2016-05-21 DIAGNOSIS — Z823 Family history of stroke: Secondary | ICD-10-CM | POA: Insufficient documentation

## 2016-05-21 DIAGNOSIS — Z7951 Long term (current) use of inhaled steroids: Secondary | ICD-10-CM | POA: Insufficient documentation

## 2016-05-21 DIAGNOSIS — E785 Hyperlipidemia, unspecified: Secondary | ICD-10-CM | POA: Diagnosis present

## 2016-05-21 DIAGNOSIS — Z79891 Long term (current) use of opiate analgesic: Secondary | ICD-10-CM | POA: Diagnosis not present

## 2016-05-21 DIAGNOSIS — I509 Heart failure, unspecified: Secondary | ICD-10-CM | POA: Insufficient documentation

## 2016-05-21 DIAGNOSIS — I252 Old myocardial infarction: Secondary | ICD-10-CM | POA: Insufficient documentation

## 2016-05-21 DIAGNOSIS — E7800 Pure hypercholesterolemia, unspecified: Secondary | ICD-10-CM | POA: Insufficient documentation

## 2016-05-21 DIAGNOSIS — H5461 Unqualified visual loss, right eye, normal vision left eye: Secondary | ICD-10-CM

## 2016-05-21 DIAGNOSIS — K760 Fatty (change of) liver, not elsewhere classified: Secondary | ICD-10-CM | POA: Insufficient documentation

## 2016-05-21 DIAGNOSIS — H53121 Transient visual loss, right eye: Secondary | ICD-10-CM | POA: Diagnosis present

## 2016-05-21 DIAGNOSIS — G8929 Other chronic pain: Secondary | ICD-10-CM | POA: Insufficient documentation

## 2016-05-21 DIAGNOSIS — J45909 Unspecified asthma, uncomplicated: Secondary | ICD-10-CM | POA: Insufficient documentation

## 2016-05-21 DIAGNOSIS — Z8674 Personal history of sudden cardiac arrest: Secondary | ICD-10-CM | POA: Insufficient documentation

## 2016-05-21 DIAGNOSIS — N2889 Other specified disorders of kidney and ureter: Secondary | ICD-10-CM | POA: Insufficient documentation

## 2016-05-21 DIAGNOSIS — M170 Bilateral primary osteoarthritis of knee: Secondary | ICD-10-CM | POA: Diagnosis not present

## 2016-05-21 DIAGNOSIS — I2510 Atherosclerotic heart disease of native coronary artery without angina pectoris: Secondary | ICD-10-CM | POA: Diagnosis not present

## 2016-05-21 DIAGNOSIS — I110 Hypertensive heart disease with heart failure: Secondary | ICD-10-CM | POA: Insufficient documentation

## 2016-05-21 DIAGNOSIS — N281 Cyst of kidney, acquired: Secondary | ICD-10-CM | POA: Insufficient documentation

## 2016-05-21 DIAGNOSIS — K900 Celiac disease: Secondary | ICD-10-CM | POA: Diagnosis not present

## 2016-05-21 DIAGNOSIS — Z90721 Acquired absence of ovaries, unilateral: Secondary | ICD-10-CM | POA: Diagnosis not present

## 2016-05-21 DIAGNOSIS — M5126 Other intervertebral disc displacement, lumbar region: Secondary | ICD-10-CM | POA: Insufficient documentation

## 2016-05-21 DIAGNOSIS — M179 Osteoarthritis of knee, unspecified: Secondary | ICD-10-CM

## 2016-05-21 DIAGNOSIS — Z888 Allergy status to other drugs, medicaments and biological substances status: Secondary | ICD-10-CM | POA: Insufficient documentation

## 2016-05-21 DIAGNOSIS — Z7982 Long term (current) use of aspirin: Secondary | ICD-10-CM | POA: Insufficient documentation

## 2016-05-21 DIAGNOSIS — Z955 Presence of coronary angioplasty implant and graft: Secondary | ICD-10-CM | POA: Diagnosis not present

## 2016-05-21 DIAGNOSIS — R0789 Other chest pain: Principal | ICD-10-CM

## 2016-05-21 DIAGNOSIS — Z8249 Family history of ischemic heart disease and other diseases of the circulatory system: Secondary | ICD-10-CM | POA: Insufficient documentation

## 2016-05-21 DIAGNOSIS — M549 Dorsalgia, unspecified: Secondary | ICD-10-CM | POA: Diagnosis present

## 2016-05-21 DIAGNOSIS — Z885 Allergy status to narcotic agent status: Secondary | ICD-10-CM | POA: Diagnosis not present

## 2016-05-21 DIAGNOSIS — E041 Nontoxic single thyroid nodule: Secondary | ICD-10-CM | POA: Insufficient documentation

## 2016-05-21 DIAGNOSIS — Z8711 Personal history of peptic ulcer disease: Secondary | ICD-10-CM | POA: Insufficient documentation

## 2016-05-21 DIAGNOSIS — Z8349 Family history of other endocrine, nutritional and metabolic diseases: Secondary | ICD-10-CM | POA: Insufficient documentation

## 2016-05-21 LAB — BASIC METABOLIC PANEL
Anion gap: 8 (ref 5–15)
BUN: 16 mg/dL (ref 6–20)
CO2: 25 mmol/L (ref 22–32)
Calcium: 9.1 mg/dL (ref 8.9–10.3)
Chloride: 108 mmol/L (ref 101–111)
Creatinine, Ser: 0.77 mg/dL (ref 0.44–1.00)
GFR calc Af Amer: >60 mL/min (ref >60)
GFR calc non Af Amer: >60 mL/min (ref >60)
Glucose, Bld: 104 mg/dL — ABNORMAL HIGH (ref 65–99)
Potassium: 3.6 mmol/L (ref 3.5–5.1)
Sodium: 141 mmol/L (ref 135–145)

## 2016-05-21 LAB — TROPONIN I
Troponin I: <0.03 ng/mL (ref <0.03)
Troponin I: <0.03 ng/mL (ref <0.03)
Troponin I: <0.03 ng/mL (ref <0.03)

## 2016-05-21 LAB — CBC
HCT: 38.3 % (ref 35.0–47.0)
Hemoglobin: 12.9 g/dL (ref 12.0–16.0)
MCH: 28.8 pg (ref 26.0–34.0)
MCHC: 33.6 g/dL (ref 32.0–36.0)
MCV: 85.7 fL (ref 80.0–100.0)
Platelets: 261 10*3/uL (ref 150–440)
RBC: 4.47 MIL/uL (ref 3.80–5.20)
RDW: 15.4 % — ABNORMAL HIGH (ref 11.5–14.5)
WBC: 9 10*3/uL (ref 3.6–11.0)

## 2016-05-21 MED ORDER — MOMETASONE FURO-FORMOTEROL FUM 100-5 MCG/ACT IN AERO
2.0000 | INHALATION_SPRAY | Freq: Two times a day (BID) | RESPIRATORY_TRACT | Status: DC
  Administered 2016-05-21 – 2016-05-22 (×2): 2 via RESPIRATORY_TRACT
  Filled 2016-05-21: qty 8.8

## 2016-05-21 MED ORDER — ONDANSETRON HCL 4 MG/2ML IJ SOLN
4.0000 mg | Freq: Once | INTRAMUSCULAR | Status: AC
  Administered 2016-05-21: 4 mg via INTRAVENOUS
  Filled 2016-05-21: qty 2

## 2016-05-21 MED ORDER — HYDROCODONE-ACETAMINOPHEN 5-325 MG PO TABS: 1.0000 | ORAL_TABLET | Freq: Three times a day (TID) | ORAL | Status: DC | PRN

## 2016-05-21 MED ORDER — SODIUM CHLORIDE 0.9 % IV SOLN
INTRAVENOUS | Status: DC
  Administered 2016-05-21: 16:00:00 via INTRAVENOUS

## 2016-05-21 MED ORDER — NITROGLYCERIN 0.4 MG SL SUBL: 0.4000 mg | SUBLINGUAL_TABLET | SUBLINGUAL | Status: DC | PRN

## 2016-05-21 MED ORDER — AMLODIPINE BESYLATE 5 MG PO TABS
5.0000 mg | ORAL_TABLET | Freq: Every day | ORAL | Status: DC
  Administered 2016-05-21 – 2016-05-22 (×2): 5 mg via ORAL
  Filled 2016-05-21 (×2): qty 1

## 2016-05-21 MED ORDER — ONDANSETRON HCL 4 MG PO TABS: 4.0000 mg | ORAL_TABLET | Freq: Four times a day (QID) | ORAL | Status: DC | PRN

## 2016-05-21 MED ORDER — ASPIRIN EC 81 MG PO TBEC
81.0000 mg | DELAYED_RELEASE_TABLET | Freq: Every day | ORAL | Status: DC
  Administered 2016-05-22: 81 mg via ORAL
  Filled 2016-05-21: qty 1

## 2016-05-21 MED ORDER — ISOSORBIDE MONONITRATE ER 30 MG PO TB24
30.0000 mg | ORAL_TABLET | Freq: Two times a day (BID) | ORAL | Status: DC
  Administered 2016-05-21 – 2016-05-22 (×2): 30 mg via ORAL
  Filled 2016-05-21 (×2): qty 1

## 2016-05-21 MED ORDER — ENOXAPARIN SODIUM 40 MG/0.4ML ~~LOC~~ SOLN
40.0000 mg | SUBCUTANEOUS | Status: DC
  Administered 2016-05-21: 40 mg via SUBCUTANEOUS
  Filled 2016-05-21: qty 0.4

## 2016-05-21 MED ORDER — CARVEDILOL 3.125 MG PO TABS
3.1250 mg | ORAL_TABLET | Freq: Two times a day (BID) | ORAL | Status: DC
  Administered 2016-05-21 – 2016-05-22 (×2): 3.125 mg via ORAL
  Filled 2016-05-21 (×2): qty 1

## 2016-05-21 MED ORDER — FAMOTIDINE 20 MG PO TABS
20.0000 mg | ORAL_TABLET | Freq: Every day | ORAL | Status: DC
  Administered 2016-05-21: 20 mg via ORAL
  Filled 2016-05-21: qty 1

## 2016-05-21 MED ORDER — MONTELUKAST SODIUM 10 MG PO TABS
10.0000 mg | ORAL_TABLET | Freq: Every day | ORAL | Status: DC
  Administered 2016-05-21: 10 mg via ORAL
  Filled 2016-05-21: qty 1

## 2016-05-21 MED ORDER — ONDANSETRON 4 MG PO TBDP
4.0000 mg | ORAL_TABLET | Freq: Three times a day (TID) | ORAL | Status: DC | PRN
  Filled 2016-05-21: qty 1

## 2016-05-21 MED ORDER — TICAGRELOR 60 MG PO TABS
60.0000 mg | ORAL_TABLET | Freq: Two times a day (BID) | ORAL | Status: DC
  Administered 2016-05-21 – 2016-05-22 (×2): 60 mg via ORAL
  Filled 2016-05-21 (×3): qty 1

## 2016-05-21 MED ORDER — HYDROCODONE-ACETAMINOPHEN 5-325 MG PO TABS
1.0000 | ORAL_TABLET | Freq: Three times a day (TID) | ORAL | Status: DC | PRN
  Administered 2016-05-22: 1 via ORAL
  Filled 2016-05-21: qty 1

## 2016-05-21 MED ORDER — IOPAMIDOL (ISOVUE-370) INJECTION 76%
125.0000 mL | Freq: Once | INTRAVENOUS | Status: AC | PRN
  Administered 2016-05-21: 125 mL via INTRAVENOUS

## 2016-05-21 MED ORDER — PANTOPRAZOLE SODIUM 40 MG PO TBEC
40.0000 mg | DELAYED_RELEASE_TABLET | Freq: Two times a day (BID) | ORAL | Status: DC
  Administered 2016-05-21 – 2016-05-22 (×2): 40 mg via ORAL
  Filled 2016-05-21 (×2): qty 1

## 2016-05-21 MED ORDER — MORPHINE SULFATE (PF) 2 MG/ML IV SOLN: 2.0000 mg | INTRAVENOUS | Status: DC | PRN

## 2016-05-21 MED ORDER — ALBUTEROL SULFATE (2.5 MG/3ML) 0.083% IN NEBU: 3.0000 mL | INHALATION_SOLUTION | Freq: Four times a day (QID) | RESPIRATORY_TRACT | Status: DC | PRN

## 2016-05-21 MED ORDER — SERTRALINE HCL 50 MG PO TABS
50.0000 mg | ORAL_TABLET | Freq: Every day | ORAL | Status: DC
  Administered 2016-05-21: 50 mg via ORAL
  Filled 2016-05-21: qty 1

## 2016-05-21 MED ORDER — ONDANSETRON HCL 4 MG/2ML IJ SOLN: 4.0000 mg | Freq: Four times a day (QID) | INTRAMUSCULAR | Status: DC | PRN

## 2016-05-21 MED ORDER — NITROGLYCERIN 2 % TD OINT: 1.0000 [in_us] | TOPICAL_OINTMENT | Freq: Four times a day (QID) | TRANSDERMAL | Status: DC

## 2016-05-21 MED ORDER — MORPHINE SULFATE (PF) 2 MG/ML IV SOLN
2.0000 mg | Freq: Once | INTRAVENOUS | Status: AC
  Administered 2016-05-21: 2 mg via INTRAVENOUS
  Filled 2016-05-21: qty 1

## 2016-05-21 MED ORDER — DIPHENHYDRAMINE HCL 25 MG PO CAPS: 25.0000 mg | ORAL_CAPSULE | Freq: Four times a day (QID) | ORAL | Status: DC | PRN

## 2016-05-21 MED ORDER — LORATADINE 10 MG PO TABS
10.0000 mg | ORAL_TABLET | Freq: Every day | ORAL | Status: DC
  Administered 2016-05-22: 10 mg via ORAL
  Filled 2016-05-21: qty 1

## 2016-05-21 MED ORDER — LORAZEPAM 1 MG PO TABS
1.0000 mg | ORAL_TABLET | Freq: Two times a day (BID) | ORAL | Status: DC
  Administered 2016-05-21 – 2016-05-22 (×3): 1 mg via ORAL
  Filled 2016-05-21 (×3): qty 1

## 2016-05-21 MED ORDER — ROSUVASTATIN CALCIUM 10 MG PO TABS
10.0000 mg | ORAL_TABLET | Freq: Every day | ORAL | Status: DC
  Administered 2016-05-21: 10 mg via ORAL
  Filled 2016-05-21: qty 1

## 2016-05-21 MED ORDER — NITROGLYCERIN 2 % TD OINT
0.5000 [in_us] | TOPICAL_OINTMENT | Freq: Four times a day (QID) | TRANSDERMAL | Status: DC
  Administered 2016-05-21: 0.5 [in_us] via TOPICAL
  Filled 2016-05-21: qty 1

## 2016-05-21 MED ORDER — DIAZEPAM 5 MG PO TABS: 5.0000 mg | ORAL_TABLET | Freq: Two times a day (BID) | ORAL | Status: DC | PRN

## 2016-05-21 MED ORDER — ACETAMINOPHEN 650 MG RE SUPP: 650.0000 mg | Freq: Four times a day (QID) | RECTAL | Status: DC | PRN

## 2016-05-21 MED ORDER — ACETAMINOPHEN 325 MG PO TABS
650.0000 mg | ORAL_TABLET | Freq: Four times a day (QID) | ORAL | Status: DC | PRN
  Administered 2016-05-22: 650 mg via ORAL
  Filled 2016-05-21: qty 2

## 2016-05-21 NOTE — ED Notes (Signed)
Pt states central to L sided CP that began last night around midnight. Took 3 nitro and 4 baby asa. Pt states this AM took 4 nitro and 4 baby asa. Pt states hx of MI with stent placement 5 years ago. States has a blockage in fall 2016 that is not blocked enough to cath. Pt alert and oriented x 4. States a little dizzinesss, "but not affecting balance." states cold sweats last night. States some L sided jaw pain.

## 2016-05-21 NOTE — ED Notes (Signed)
Admitting at bedside 

## 2016-05-21 NOTE — ED Notes (Signed)
Pt returned from scan and ambulatory to restroom

## 2016-05-21 NOTE — ED Notes (Signed)
Nitro applied to L chest

## 2016-05-21 NOTE — Progress Notes (Signed)
56 yo wf admitted to room 238 from ED with chest pain, gait steady.  No distress on ra.  SL lt ac flushes well.  Cardiac monitor applied to pt and verified with Danae Chen, CNA.  Pt states chest pain at this time is 6/10, tolerable level for her is a 5-6 out of 10.  Lungs diminished bil.  Skin intact.  Oriented to room and surroundings, POC reviewed with pt.  Denies need at this time. CB in reach, SR up x2.

## 2016-05-21 NOTE — Plan of Care (Signed)
Problem: Safety: Goal: Ability to remain free from injury will improve Outcome: Progressing Fall precautions in place, non skid socks  Problem: Pain Managment: Goal: General experience of comfort will improve Outcome: Not Progressing Prn medications  Problem: Tissue Perfusion: Goal: Risk factors for ineffective tissue perfusion will decrease Outcome: Progressing SQ Lovenox   Problem: Cardiac: Goal: Ability to achieve and maintain adequate cardiovascular perfusion will improve Outcome: Progressing Pt on cardiac monitor, 2Decho ordered

## 2016-05-21 NOTE — ED Notes (Signed)
Pt ambulatory to bathroom by self.  ° °

## 2016-05-21 NOTE — Consult Note (Signed)
Cardiology Consultation Note  Patient ID: Brenda Craig, MRN: [id number], DOB/AGE: February 18, 1960 56 y.o. Admit date: 05/21/2016   Date of Consult: 05/21/2016 Primary Physician: Albina Billet, MD Primary Cardiologist: Dr. Manuella Ghazi, MD ([facility]) Requesting Physician: Dr. Leslye Peer, MD  Chief Complaint: Chest pain Reason for Consult: Same  HPI: Brenda Craig is a 56 y.o. female who is being seen today for the evaluation of chest pain at the request of Dr. Leslye Peer, MD. Patient has a h/o 2-vessel CAD s/p PCI/stenting to to LCx in 2013 in the setting of an ST elevation MI complicated by Vfib arrest, s/p PCI/stenting to LAD in the setting of unstable angina in 03/2013, chronic back pain s/p multiple surgeries, carotid artery disease, possible prior TIA, celiac disease, chronic cough, HTN, HLD, OA requiring bilateral knee steroid injections, and anxiety who presented to [facility] on 05/21/16 with left sided chest pain.    She was previously admitted to [facility] in October 2016 for upper back pain. She ruled out and was advised to follow up with primary cardiologist. In follow up with her primary cardiologist she underwent repeat cardiac cath on 10/14/2014 that showed patent stents along LAD and LCx with 50% LAD stenosis that was not significant by FFR. Admitted in August 2017 for similar issue. Ruled out. Had normal echo, was started on Ranexa. She followed up with her primary cardiologist noted continued nonexertional back pain.   She was admitted on 12/20/16 for same back pain and reported having undergone a stress test on 12/11, though results were not in [place]. IM spoke with the patient's primary cardiologist who advised [facility] IM MD that the patient's stress test was negative and she was discharged on increased dose of her Ranexa to 1000 mg bid. She most recently saw her primary cardiologist on 01/18/2016 with improved chest pain. She did not tolerate increased dose of Ranexa and stopped it. She was  continued on Imdur 30 mg daily along with Coreg 3.125 mg bid along with Brilinta 60 mg bid.   Patient reports she overall did not feel well upon waking up on 5/13, though felt like this was 2/2 her celiac disease. She continued on with her day as usual. At 11:58 PM on 5/13 she woke up with left-sided chest pain at rest. Pain is described as sharp and worse with deep inspiration, feels just like her prior admissions though "more intense." No associated SOB, palpitations, diaphoresis, nausea, vomiting, dizziness, presyncope, or syncope. Of note, she has been referred to a pulmonologist 2/2 cough that has been present since 09/2015 in which she has taken multiple courses of prednisone and antibiotics.   Following the developement of her chest pain she took SL NTG x 3 along with 4 ASA 81 mg that did not relieve her pain. She was able to sleep without issues. Around 9 AM this morning she reported her pain got worse prompting her to take SL NTG x 3 again along with 4 ASA 81 mg and called EMS. Upon EMS arrival she was noted to be hypertensive with BP 180/120. She was given 1 nitro spray en route by EMS.   Upon her arrival to [facility] she was noted to have BP 139/102, HR 76 bpm, temperature 99.6, oxygen saturation 94% on room air, weight 202 pounds. Labs showed troponin negative x 1 to date, unremarkable cbc, SCr 0.77, K+ 3.6. EKG showed NSR with sinus arrhythmia, 74 bpm, no acute st/t changes. CXR showedbibasilar scarring without active disease. She reported already taking  4 ASA 81 mg at home this morning prior to her arrival in the ED along with SL NTG. In the ED she was given morphine, nitro paste and Zofran. CT chest is pending per IM. Currently with improving chest pain.    Past Medical History:  Diagnosis Date  . Acid reflux 01/17/2013  . Asthma   . Celiac disease   . CHF (congestive heart failure) ([place])   . Coronary artery disease   . Heart trouble   . History of cardiac arrest   . Hypercholesteremia   .  Hypertension   . MI (myocardial infarction) ([place])       Most Recent Cardiac Studies: Lexiscan stress Cardiolite 12/19/2015: Conclusion: Normal poststress ejection fraction of 75 %. Normal rest/stress perfusion scan with no evidence of ischemia or infarction.  2-D echo 08/23/2015 at [facility]: Ejection fraction 60-65%. Normal systolic and diastolic function. No pulmonary hypertension.  Chest x-ray 8/14/2017s: Scattered bibasilar atelectatic changes noted. No active cardiopulmonary disease.  Chest CT scan 08/22/2015: Negative for pulmonary embolism.  PSEUDOANEURYSM RULE OUT, RIGHT 10/19/14: Summary 1. There is no evidence of a pseudoaneurysm or A-V fistula noted in the right arm. 2. Right arm arterial flow appears grossly normal in radial, ulnar and brachial arteries.   Cardiac Catheterization 10/18/14: Final results : 1. Clinical : Recurrent chest pains etiology undetermined. 2. Anatomic : Normal LAD and marginal circumflex stents. Mild progression of atherosclerosis about 10% at all focal previous stenosis. Mid LAD has 50% stenosis. Negative FFR for hemodynamic significance. Other stenosis including 30% mid and distal right coronary artery and 30% followed by 50% mid LAD stenosis. Ejection fraction improved 55-60%.  10/10/2014 results from [place]:  Abdominal u ltrasound negative for cholelithiasis. Hepatic steatosis noted. Chest x-ray-mild basal lung scarring otherwise normal.   Nuclear stress test 10/09/2014: no clear evidence of stress-induced ischemia. Ejection fraction 45%. Inferolateral wall hypokinesis.  Bilateral carotid duplex scan April 21, 2013:  Negative. Less than 50% stenosis of right and left internal carotid artery. Antegrade flow in both vertebral arteries.  Cardiac Catheterization 03/28/13: FINAL DIAGNOSES: 1. Clinical - Chest pain suggestive of unstable or progressive angina. 2. Anatomical - Progression of coronary artery disease, 70 percent mid left  anterior descending stenosis increased to 95 percent. This was decreased to 0 percent with the help of a 16/3.5 millimeter PROMUS. Premiere drug-eluting stent, followed by a kissing balloon angioplasty between the first diagonal branch and mid left anterior descending. The distal left anterior descending has mild 30 percent stenosis. The right coronary artery has mild 20 percent stenosis. Circumflex marginal stent is normal. 3. Ejection fraction 60 percent.  Lexiscan Stress Cardiolite report 10/04/2011: Stress echocardiogram by definitely study-negative for ischemia. Normal ejection fraction of 84%. Negative for ischemia or infarction. 13 minutes total exercise and modified Bruce protocol.  Echocardiogram Complete 05/27/11: Summary: The study is technically limited due to poor acoustic windows. Mild concentric left ventricular hypertrophy is observed. The EF is estimated at 45-50%. Moderate posterior and lateral wall hypokinesis noted. Abnormal left ventricular diastolic filling is observed, consistent with impaired LV relaxation(Stage I). There is trivial mitral and tricuspid regurgitation. There is no pericardial effusion. No evidence of thrombus.    Surgical History:  Past Surgical History:  Procedure Laterality Date  . ABLATION    . BACK SURGERY    . HEART STENTS    . OOPHORECTOMY Right      Home Meds: Prior to Admission medications   Medication Sig Start Date End Date Taking? Authorizing  Provider  aspirin EC 81 MG tablet Take 81 mg by mouth daily.   Yes [provider]  budesonide-formoterol (SYMBICORT) 80-4.5 MCG/ACT inhaler Inhale 2 puffs into the lungs 2 (two) times daily. 05/15/16  Yes Wilhelmina Mcardle, MD  carvedilol (COREG) 3.125 MG tablet Take 3.125 mg by mouth 2 (two) times daily with a meal.  01/17/13  Yes [provider]  cetirizine (ZYRTEC) 10 MG tablet Take 1 tablet (10 mg total) by mouth daily. 05/15/16  Yes Wilhelmina Mcardle, MD  clotrimazole-betamethasone  (LOTRISONE) cream APPLY TO AFFECTED AND SURROUNDING AREAS TWICE A DAY IN THE MORNING AND EVENING FOR 14 DAYS 2/63/78  Yes Copland, Deirdre Evener, PA-C  Dextromethorphan-Guaifenesin 5-100 MG/5ML LIQD Take 10 mLs by mouth 2 (two) times daily. 05/15/16  Yes Wilhelmina Mcardle, MD  diazepam (VALIUM) 5 MG tablet Take 1 tablet (5 mg total) by mouth every 12 (twelve) hours as needed for anxiety. 08/23/15  Yes Vaughan Basta, MD  HYDROcodone-acetaminophen (NORCO/VICODIN) 5-325 MG tablet Take 1 tablet by mouth every 8 (eight) hours as needed. 03/09/15  Yes [provider]  isosorbide mononitrate (IMDUR) 30 MG 24 hr tablet Take 15 mg by mouth 2 (two) times daily.  03/26/15  Yes [provider]  LORazepam (ATIVAN) 1 MG tablet Take 1 mg by mouth 2 (two) times daily.  11/14/12  Yes [provider]  montelukast (SINGULAIR) 10 MG tablet Take 10 mg by mouth at bedtime.   Yes [provider]  NITROSTAT 0.4 MG SL tablet Place 0.4 mg under the tongue every 5 (five) minutes as needed for chest pain.  11/03/12  Yes [provider]  ondansetron (ZOFRAN-ODT) 4 MG disintegrating tablet Take 1 tablet (4 mg total) by mouth every 8 (eight) hours as needed for nausea or vomiting. 12/21/15  Yes Max Sane, MD  pantoprazole (PROTONIX) 20 MG tablet Take 40 mg by mouth 2 (two) times daily. 11/17/12  Yes [provider]  PROAIR HFA 108 (90 Base) MCG/ACT inhaler Inhale 2 puffs into the lungs every 6 (six) hours as needed. 03/26/16  Yes [provider]  ranitidine (ZANTAC) 150 MG tablet Take 150 mg by mouth daily.   Yes [provider]  rosuvastatin (CRESTOR) 10 MG tablet Take 10 mg by mouth at bedtime.  10/16/12  Yes [provider]  ticagrelor (BRILINTA) 60 MG TABS tablet Take 60 mg by mouth 2 (two) times daily.  11/06/12  Yes [provider]  sertraline (ZOLOFT) 50 MG tablet Take 50 mg by mouth at bedtime.  11/17/12   [provider]     Inpatient Medications:  . amLODipine  5 mg Oral Daily  . [START ON 05/22/2016] aspirin EC  81 mg Oral Daily  . carvedilol  3.125 mg Oral BID WC  . famotidine  20 mg Oral QHS  . [START ON 05/22/2016] loratadine  10 mg Oral Daily  . LORazepam  1 mg Oral BID  . mometasone-formoterol  2 puff Inhalation BID  . montelukast  10 mg Oral QHS  . nitroGLYCERIN  1 inch Topical Q6H  . pantoprazole  40 mg Oral BID  . rosuvastatin  10 mg Oral QHS  . sertraline  50 mg Oral QHS  . ticagrelor  60 mg Oral BID   . sodium chloride      Allergies:  Allergies  Allergen Reactions  . Codeine Nausea And Vomiting and Rash  . Nexium [Esomeprazole Magnesium] Palpitations  . Omeprazole Magnesium Palpitations  . Oxycodone-Acetaminophen Itching,  Nausea And Vomiting and Rash  . Percocet [Oxycodone-Acetaminophen] Itching, Nausea And Vomiting and Rash    Social History   Social History  . Marital status: Divorced    Spouse name: N/A  . Number of children: N/A  . Years of education: N/A   Occupational History  . Not on file.   Social History Main Topics  . Smoking status: Never Smoker  . Smokeless tobacco: Never Used  . Alcohol use Yes     Comment: OCCASIONALLY  . Drug use: No  . Sexual activity: Not Currently   Other Topics Concern  . Not on file   Social History Narrative  . No narrative on file     Family History  Problem Relation Age of Onset  . CAD Father   . Transient ischemic attack Father   . Hypothyroidism Mother      Review of Systems: Review of Systems  Constitutional: Positive for malaise/fatigue. Negative for chills, diaphoresis, fever and weight loss.  HENT: Negative for congestion.   Eyes: Negative for blurred vision, double vision, photophobia, pain, discharge and redness.  Respiratory: Positive for cough. Negative for hemoptysis, sputum production, shortness of breath and wheezing.   Cardiovascular: Positive for chest pain. Negative for palpitations, orthopnea,  claudication, leg swelling and PND.  Gastrointestinal: Negative for abdominal pain, blood in stool, heartburn, melena, nausea and vomiting.  Genitourinary: Negative for hematuria.  Musculoskeletal: Positive for joint pain. Negative for falls and myalgias.       Bilateral knees  Skin: Negative for rash.  Neurological: Positive for weakness. Negative for dizziness, tingling, tremors, sensory change, speech change, focal weakness and loss of consciousness.  Endo/Heme/Allergies: Does not bruise/bleed easily.  Psychiatric/Behavioral: Negative for substance abuse. The patient is nervous/anxious.   All other systems reviewed and are negative.   Labs:  Recent Labs  05/21/16 1153  TROPONINI <0.03   Lab Results  Component Value Date   WBC 9.0 05/21/2016   HGB 12.9 05/21/2016   HCT 38.3 05/21/2016   MCV 85.7 05/21/2016   PLT 261 05/21/2016     Recent Labs Lab 05/21/16 1153  NA 141  K 3.6  CL 108  CO2 25  BUN 16  CREATININE 0.77  CALCIUM 9.1  GLUCOSE 104*   Lab Results  Component Value Date   CHOL 106 08/22/2015   HDL 42 08/22/2015   LDLCALC 43 08/22/2015   TRIG 107 08/22/2015   No results found for: DDIMER  Radiology/Studies:  Dg Chest 2 View  Result Date: 05/21/2016 CLINICAL DATA:  Chest pain EXAM: CHEST  2 VIEW COMPARISON:  02/20/2016 FINDINGS: Linear areas of scarring in the lung bases, stable. Heart is normal size. No effusions. No acute bony abnormality. IMPRESSION: Bibasilar scarring.  No active disease. Electronically Signed   By: Rolm Baptise M.D.   On: 05/21/2016 12:56    EKG: Interpreted by me showed: NSR with sinus arrhythmia, 74 bpm, no acute st/t changes Telemetry: Interpreted by me showed: not on telemetry   Weights: Filed Weights   05/21/16 1153  Weight: 202 lb (91.6 kg)     Physical Exam: Blood pressure (!) 143/107, pulse 65, temperature 99.6 F (37.6 C), temperature source Oral, resp. rate 16, height 5\' 5"  (1.651 m), weight 202 lb (91.6 kg),  SpO2 94 %. Body mass index is 33.61 kg/m. General: Well developed, well nourished, in no acute distress. Head: Normocephalic, atraumatic, sclera non-icteric, no xanthomas, nares are without discharge.  Neck: Negative for carotid bruits. JVD not elevated. Lungs:  Clear bilaterally to auscultation without wheezes, rales, or rhonchi. Breathing is unlabored. Heart: RRR with S1 S2. No murmurs, rubs, or gallops appreciated. Palpation of the mid, anterior chest wall fully reproduces the patient's pain.  Abdomen: Soft, non-tender, non-distended with normoactive bowel sounds. No hepatomegaly. No rebound/guarding. No obvious abdominal masses. Msk:  Strength and tone appear normal for age. Extremities: No clubbing or cyanosis. No edema. Distal pedal pulses are 2+ and equal bilaterally. Neuro: Alert and oriented X 3. No facial asymmetry. No focal deficit. Moves all extremities spontaneously. Psych:  Responds to questions appropriately with a normal affect.    Assessment and Plan:  Principal Problem:   Atypical chest pain Active Problems:   CAD in native artery   Back pain   S/P coronary artery stent placement   Anxiety   Celiac disease   Hyperlipidemia   Essential hypertension   Transient blindness of right eye   OA (osteoarthritis) of knee    1. Atypical chest pain/CAD as above: -Pain is fully reproducible to palpation on exam and worse with deep inspiration and coughing -Patient does have significant risk factors for CAD though has recently had a negative Myoview -Continue to cycle troponin to rule out -CTA PE protocol pending per IM given pulmonary component of her pain -If her pain persists she ultimately may require a repeat LHC for definitive evaluation, d/w MD -She did not tolerate Ranexa at any dose and discontinued this -Will titrate Imdur to 60 mg daily (has been taking 15 mg bid) -Continue Brilinta 60 mg bid along with ASA 81 mg daily  -Continue Coreg and Crestor -She has a  low-grade fever at 99.6, though CXR does not show definite infection  2. Transient right eye blindness: -Has happened several times over the years -Has an appointment with ophthalmology later this month -Consider outpatient cardiac monitoring, defer to primary cardiologist   3. Carotid artery disease: -Mild bilateral disease per primary cardiologist -Outpatient follow up  4. Chronic cough: -Has appointment with pulmonology this week -Previously on several rounds of antibiotics and steroids without relief  -Inhalers per IM -CTA as above  5. Accelerated HTN: -Nitro paste  -Titrate Imdur as above -Coreg, titrate as needed -IM added Norvasc  6. Anxiety: -Would benefit from outpatient evaluation    Signed, Marcille Blanco Soldier Pager: [phone] 05/21/2016, 2:09 PM

## 2016-05-21 NOTE — H&P (Addendum)
[place] [facility] [place] NAME: Brenda Craig    MR#:  [id number]  DATE OF BIRTH:  [birth year]  DATE OF ADMISSION:  05/21/2016  PRIMARY CARE PHYSICIAN: Albina Billet, MD   REQUESTING/REFERRING PHYSICIAN: Dr Lavonia Drafts  CHIEF COMPLAINT:   Chief Complaint  Patient presents with  . Chest Pain    HISTORY OF PRESENT ILLNESS:  Brenda Craig  is a 56 y.o. female with a past medical history of heart disease. She presents to the ER with chest pain. Yesterday she wasn't feeling well. She was nauseous and tired and had some ringing in her ears. She went to bed. She was awakened at 11:58 PM with sharp pain in the chest that radiated straight through to her back. She took 3 nitroglycerin and 4 aspirin and a Gas-X. She was unable to sleep. Her blood pressure was high this morning. This morning her pain is rated as a pressure 8 out of 10 intensity left side of the chest. It's been constant since last evening but it comes and goes in severity. It never goes away. She also has a headache with the nitroglycerin. Of note over the weekend she did have a transient blindness in her right eye lasting 6 minutes in duration. 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  PAST MEDICAL HISTORY:   Past Medical History:  Diagnosis Date  . Acid reflux 01/17/2013  . Asthma   . Celiac disease   . CHF (congestive heart failure) ([place])   . Coronary artery disease   . Heart trouble   . History of cardiac arrest   . Hypercholesteremia   . Hypertension   . MI (myocardial infarction) ([place])     PAST SURGICAL HISTORY:   Past Surgical History:  Procedure Laterality Date  . ABLATION    . BACK SURGERY    . HEART STENTS    . OOPHORECTOMY Right     SOCIAL HISTORY:   Social History  Substance Use Topics  . Smoking status: Never Smoker  . Smokeless tobacco: Never Used  . Alcohol use Yes     Comment: OCCASIONALLY    FAMILY HISTORY:    Family History  Problem Relation Age of Onset  . CAD Father   . Transient ischemic attack Father   . Hypothyroidism Mother     DRUG ALLERGIES:   Allergies  Allergen Reactions  . Codeine Nausea And Vomiting and Rash  . Nexium [Esomeprazole Magnesium] Palpitations  . Omeprazole Magnesium Palpitations  . Oxycodone-Acetaminophen Itching, Nausea And Vomiting and Rash  . Percocet [Oxycodone-Acetaminophen] Itching, Nausea And Vomiting and Rash    REVIEW OF SYSTEMS:  CONSTITUTIONAL:Positive for fever, chills and sweats. Positive for fatigue and tiredness. Positive for weight gain EYES: Wears glasses. Transient blindness 6 minutes of the right eye with an episode this weekend. EARS, NOSE, AND THROAT: No tinnitus or ear pain. No sore throat. Positive for runny nose RESPIRATORY: Positive for chronic cough. Positive for shortness of breath, no wheezing or hemoptysis.  CARDIOVASCULAR: Positive for chest pain, no orthopnea, edema.  GASTROINTESTINAL: Positive for nausea, occasional vomiting with coughing spells. Alternating constipation and diarrhea. No abdominal pain. No blood in bowel movements GENITOURINARY: No dysuria, hematuria.  ENDOCRINE: No polyuria, nocturia,  HEMATOLOGY: No anemia, easy bruising or bleeding SKIN: No rash or lesion. MUSCULOSKELETAL: Positive joint pain and muscle pain. Last week had cortisone shots in both knees  NEUROLOGIC: No tingling, numbness, weakness.  PSYCHIATRY: Positive for anxiety.  MEDICATIONS AT HOME:   Prior to Admission medications   Medication Sig Start Date End Date Taking? Authorizing Provider  aspirin EC 81 MG tablet Take 81 mg by mouth daily.   Yes [provider]  budesonide-formoterol (SYMBICORT) 80-4.5 MCG/ACT inhaler Inhale 2 puffs into the lungs 2 (two) times daily. 05/15/16  Yes Wilhelmina Mcardle, MD  carvedilol (COREG) 3.125 MG tablet Take 3.125 mg by mouth 2 (two) times daily with a meal.  01/17/13  Yes [provider]   cetirizine (ZYRTEC) 10 MG tablet Take 1 tablet (10 mg total) by mouth daily. 05/15/16  Yes Wilhelmina Mcardle, MD  clotrimazole-betamethasone (LOTRISONE) cream APPLY TO AFFECTED AND SURROUNDING AREAS TWICE A DAY IN THE MORNING AND EVENING FOR 14 DAYS 2/44/01  Yes Copland, Deirdre Evener, PA-C  Dextromethorphan-Guaifenesin 5-100 MG/5ML LIQD Take 10 mLs by mouth 2 (two) times daily. 05/15/16  Yes Wilhelmina Mcardle, MD  diazepam (VALIUM) 5 MG tablet Take 1 tablet (5 mg total) by mouth every 12 (twelve) hours as needed for anxiety. 08/23/15  Yes Vaughan Basta, MD  HYDROcodone-acetaminophen (NORCO/VICODIN) 5-325 MG tablet Take 1 tablet by mouth every 8 (eight) hours as needed. 03/09/15  Yes [provider]  isosorbide mononitrate (IMDUR) 30 MG 24 hr tablet Take 15 mg by mouth 2 (two) times daily.  03/26/15  Yes [provider]  LORazepam (ATIVAN) 1 MG tablet Take 1 mg by mouth 2 (two) times daily.  11/14/12  Yes [provider]  montelukast (SINGULAIR) 10 MG tablet Take 10 mg by mouth at bedtime.   Yes [provider]  NITROSTAT 0.4 MG SL tablet Place 0.4 mg under the tongue every 5 (five) minutes as needed for chest pain.  11/03/12  Yes [provider]  ondansetron (ZOFRAN-ODT) 4 MG disintegrating tablet Take 1 tablet (4 mg total) by mouth every 8 (eight) hours as needed for nausea or vomiting. 12/21/15  Yes Max Sane, MD  pantoprazole (PROTONIX) 20 MG tablet Take 40 mg by mouth 2 (two) times daily. 11/17/12  Yes [provider]  PROAIR HFA 108 (90 Base) MCG/ACT inhaler Inhale 2 puffs into the lungs every 6 (six) hours as needed. 03/26/16  Yes [provider]  ranitidine (ZANTAC) 150 MG tablet Take 150 mg by mouth daily.   Yes [provider]  rosuvastatin (CRESTOR) 10 MG tablet Take 10 mg by mouth at bedtime.  10/16/12  Yes [provider]  ticagrelor (BRILINTA) 60 MG TABS tablet Take 60 mg by mouth 2 (two) times daily.  11/06/12   Yes [provider]  sertraline (ZOLOFT) 50 MG tablet Take 50 mg by mouth at bedtime.  11/17/12   [provider]      VITAL SIGNS:  Blood pressure (!) 143/107, pulse 65, temperature 99.6 F (37.6 C), temperature source Oral, resp. rate 16, height 5' 5"  (1.651 m), weight 91.6 kg (202 lb), SpO2 94 %.  PHYSICAL EXAMINATION:  GENERAL:  56 y.o.-year-old patient lying in the bed with no acute distress.  EYES: Pupils equal, round, reactive to light and accommodation. No scleral icterus. Extraocular muscles intact.  HEENT: Head atraumatic, normocephalic. Oropharynx and nasopharynx clear.  NECK:  Supple, no jugular venous distention. No thyroid enlargement, no tenderness.  LUNGS: Normal breath sounds bilaterally, no wheezing, rales,rhonchi or crepitation. No use of accessory muscles of respiration.  CARDIOVASCULAR: S1, S2 normal. No murmurs, rubs, or gallops.  ABDOMEN: Soft, nontender, nondistended. Bowel sounds present. No organomegaly or mass.  EXTREMITIES: Trace edema.  No cyanosis, or clubbing.  NEUROLOGIC: Cranial nerves II through XII are intact. Muscle strength 5/5 in all extremities. Sensation intact. Gait not checked.  PSYCHIATRIC: The patient is alert and oriented x 3.  SKIN: No rash, lesion, or ulcer.   LABORATORY PANEL:   CBC  Recent Labs Lab 05/21/16 1153  WBC 9.0  HGB 12.9  HCT 38.3  PLT 261   ------------------------------------------------------------------------------------------------------------------  Chemistries   Recent Labs Lab 05/21/16 1153  NA 141  K 3.6  CL 108  CO2 25  GLUCOSE 104*  BUN 16  CREATININE 0.77  CALCIUM 9.1   ------------------------------------------------------------------------------------------------------------------  Cardiac Enzymes  Recent Labs Lab 05/21/16 1153  TROPONINI <0.03    ------------------------------------------------------------------------------------------------------------------  RADIOLOGY:  Dg Chest 2 View  Result Date: 05/21/2016 CLINICAL DATA:  Chest pain EXAM: CHEST  2 VIEW COMPARISON:  02/20/2016 FINDINGS: Linear areas of scarring in the lung bases, stable. Heart is normal size. No effusions. No acute bony abnormality. IMPRESSION: Bibasilar scarring.  No active disease. Electronically Signed   By: Rolm Baptise M.D.   On: 05/21/2016 12:56    EKG:   Normal sinus rhythm with sinus arrhythmia 74 bpm. No acute ST-T wave changes.  IMPRESSION AND PLAN:   1. Chest pain. Patient with history of coronary artery disease. First troponin is negative. Since pain started last night this could actually be a third troponin. I will send off 2 more troponins. Case discussed with cardiology Dr. Fletcher Anon to evaluate the patient. We'll get a CT angiogram of the chest to rule out pulmonary embolism and/or aortic dissection. Continue aspirin, Brilinta, Coreg and Crestor. Check a lipid profile in the a.m. 2. Accelerated hypertension. Nitropaste ordered. Will also give a dose of Norvasc. Continue to monitor closely. 3. Transient vision loss right eye will get a CT angios of the carotids. May end up needing an MRI of the brain.  We'll get an echocardiogram with bubble study. This could be an ocular migraine also. 4. Chronic cough. On inhaler and Singulair 5. GERD on PPI 6. No signs of congestive heart failure  All the records are reviewed and case discussed with ED provider. Management plans discussed with the patient, family and they are in agreement.  CODE STATUS: Full code  TOTAL TIME TAKING CARE OF THIS PATIENT: 55 minutes.    Loletha Grayer M.D on 05/21/2016 at 1:56 PM  Between 7am to 6pm - Pager - [phone]  After 6pm call admission pager [phone]  [facility]  [phone]  CC: Primary care physician; Albina Billet, MD

## 2016-05-21 NOTE — ED Triage Notes (Signed)
Patient presents to the ED with left sided chest pain/tightness that began last night around midnight.  Patient states she took 3 sl nitro last night which did not relieve pain but then around 9am the pain became worse and patient noted her blood pressure was high.  Patient states per EMS her blood pressure was 180/120.  Patient took 2 nitro this am and then EMS gave patient 1 nitro spray.  Patient states she took 4 baby aspirin last night, 1 this am early and then 4 more baby aspirin around 10pm.  Patient has history of cardiac arrest.  Patient states immediately prior to arrest her EKG was normal.  Patient does not appear in any discomfort at this time.  Is complaining of nausea.

## 2016-05-21 NOTE — ED Provider Notes (Signed)
[facility] Emergency Department Provider Note   ____________________________________________    I have reviewed the triage vital signs and the nursing notes.   HISTORY  Chief Complaint Chest Pain     HPI Brenda Craig is a 56 y.o. female who presents with greater than 12 hours of chest discomfort. Patient reports a history of myocardial infarction with V. fib arrest in 2013, since that time she had another stent placed in 2014. She had another catheterization in 2016 which demonstrated several 50% lesions. She reports yesterday evening she developed central chest pressure which continued throughout the night. He took 3 nitroglycerin tablets with no relief. She took aspirin with no relief. She reports this feels similar to when she had a heart attack in the past. Her cardiologist is in [employer]   Past Medical History:  Diagnosis Date  . Acid reflux 01/17/2013  . Asthma   . Celiac disease   . CHF (congestive heart failure) ([place])   . Coronary artery disease   . Heart trouble   . History of cardiac arrest   . Hypercholesteremia   . Hypertension   . MI (myocardial infarction) [facility])     Patient Active Problem List   Diagnosis Date Noted  . OA (osteoarthritis) of knee 05/21/2016  . Atypical chest pain 12/20/2015  . Diarrhea   . Chronic cough 03/28/2015  . Colon polyp 03/28/2015  . DU (duodenal ulcer) 03/28/2015  . Hemorrhoid 03/28/2015  . 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 Calculus of kidney 03/28/2015  . Kidney cysts 03/28/2015  . Displacement of lumbar intervertebral disc without myelopathy 02/14/2015  . Angina pectoris ([place])   . CAD in native artery   . Back pain   . S/P coronary artery stent placement   . Hyperlipidemia   . Encounter for preprocedural cardiovascular examination 05/30/2014  . Excess weight 08/11/2013  . Celiac disease 07/22/2013  . Transient blindness of right eye 05/25/2013  . Acid  reflux 01/17/2013  . Airway hyperreactivity 01/17/2013  . Anxiety 01/17/2013  . Arteriosclerosis of coronary artery 01/17/2013  . HLD (hyperlipidemia) 01/17/2013  . BP (high blood pressure) 01/17/2013  . Atherosclerosis of coronary artery 10/31/2012  . Essential hypertension 10/31/2012  . Hypercholesterolemia 10/31/2012  . Presence of stent in anterior descending branch of left coronary artery 10/31/2012  . Presence of stent in coronary artery 10/31/2012    Past Surgical History:  Procedure Laterality Date  . ABLATION    . BACK SURGERY    . HEART STENTS    . OOPHORECTOMY Right     Prior to Admission medications   Medication Sig Start Date End Date Taking? Authorizing Provider  aspirin EC 81 MG tablet Take 81 mg by mouth daily.   Yes [provider]  budesonide-formoterol (SYMBICORT) 80-4.5 MCG/ACT inhaler Inhale 2 puffs into the lungs 2 (two) times daily. 05/15/16  Yes Wilhelmina Mcardle, MD  carvedilol (COREG) 3.125 MG tablet Take 3.125 mg by mouth 2 (two) times daily with a meal.  01/17/13  Yes [provider]  cetirizine (ZYRTEC) 10 MG tablet Take 1 tablet (10 mg total) by mouth daily. 05/15/16  Yes Wilhelmina Mcardle, MD  clotrimazole-betamethasone (LOTRISONE) cream APPLY TO AFFECTED AND SURROUNDING AREAS TWICE A DAY IN THE MORNING AND EVENING FOR 14 DAYS 02/05/76  Yes Copland, Deirdre Evener, PA-C  Dextromethorphan-Guaifenesin 5-100 MG/5ML LIQD Take 10 mLs by mouth 2 (two) times daily. 05/15/16  Yes Wilhelmina Mcardle, MD  diazepam (VALIUM) 5 MG tablet Take 1 tablet (5 mg total) by mouth every 12 (twelve) hours as needed for anxiety. 08/23/15  Yes Vaughan Basta, MD  HYDROcodone-acetaminophen (NORCO/VICODIN) 5-325 MG tablet Take 1 tablet by mouth every 8 (eight) hours as needed. 03/09/15  Yes [provider]  isosorbide mononitrate (IMDUR) 30 MG 24 hr tablet Take 15 mg by mouth 2 (two) times daily.  03/26/15  Yes [provider]  LORazepam (ATIVAN) 1 MG tablet  Take 1 mg by mouth 2 (two) times daily.  11/14/12  Yes [provider]  montelukast (SINGULAIR) 10 MG tablet Take 10 mg by mouth at bedtime.   Yes [provider]  NITROSTAT 0.4 MG SL tablet Place 0.4 mg under the tongue every 5 (five) minutes as needed for chest pain.  11/03/12  Yes [provider]  ondansetron (ZOFRAN-ODT) 4 MG disintegrating tablet Take 1 tablet (4 mg total) by mouth every 8 (eight) hours as needed for nausea or vomiting. 12/21/15  Yes Max Sane, MD  pantoprazole (PROTONIX) 20 MG tablet Take 40 mg by mouth 2 (two) times daily. 11/17/12  Yes [provider]  PROAIR HFA 108 (90 Base) MCG/ACT inhaler Inhale 2 puffs into the lungs every 6 (six) hours as needed. 03/26/16  Yes [provider]  ranitidine (ZANTAC) 150 MG tablet Take 150 mg by mouth daily.   Yes [provider]  rosuvastatin (CRESTOR) 10 MG tablet Take 10 mg by mouth at bedtime.  10/16/12  Yes [provider]  ticagrelor (BRILINTA) 60 MG TABS tablet Take 60 mg by mouth 2 (two) times daily.  11/06/12  Yes [provider]  sertraline (ZOLOFT) 50 MG tablet Take 50 mg by mouth at bedtime.  11/17/12   [provider]     Allergies Codeine; Nexium [esomeprazole magnesium]; Omeprazole magnesium; Oxycodone-acetaminophen; and Percocet [oxycodone-acetaminophen]  Family History  Problem Relation Age of Onset  . CAD Father   . Transient ischemic attack Father   . Hypothyroidism Mother     Social History Social History  Substance Use Topics  . Smoking status: Never Smoker  . Smokeless tobacco: Never Used  . Alcohol use Yes     Comment: OCCASIONALLY    Review of Systems  Constitutional: No fever/chills Eyes: No visual changes.  ENT: No sore throat. Cardiovascular: As above Respiratory: Denies shortness of breath. Gastrointestinal: No abdominal pain.  No nausea, no vomiting.   Genitourinary: Negative for dysuria. Musculoskeletal:  Negative for back pain. Skin: Negative for rash. Neurological: Negative for headaches or weakness   ____________________________________________   PHYSICAL EXAM:  VITAL SIGNS: ED Triage Vitals [05/21/16 1153]  Enc Vitals Group     BP (!) 139/102     Pulse Rate 76     Resp 18     Temp 99.6 F (37.6 C)     Temp Source Oral     SpO2 94 %     Weight 202 lb (91.6 kg)     Height 5\' 5"  (1.651 m)     Head Circumference      Peak Flow      Pain Score 7     Pain Loc      Pain Edu?      Excl. in [place]?     Constitutional: Alert and oriented. No acute distress. Pleasant and interactive Eyes: Conjunctivae are normal.  Head: Atraumatic. Nose: No congestion/rhinnorhea. Mouth/Throat: Mucous membranes are moist.    Cardiovascular: Normal rate, regular rhythm. Grossly normal heart sounds.  Good peripheral circulation. Respiratory: Normal respiratory effort.  No retractions. Lungs CTAB. Gastrointestinal: Soft and nontender. No distention.  No CVA tenderness. Genitourinary: deferred Musculoskeletal: No lower extremity tenderness nor edema.  Warm and well perfused Neurologic:  Normal speech and language. No gross focal neurologic deficits are appreciated.  Skin:  Skin is warm, dry and intact. No rash noted. Psychiatric: Mood and affect are normal. Speech and behavior are normal.  ____________________________________________   LABS (all labs ordered are listed, but only abnormal results are displayed)  Labs Reviewed  BASIC METABOLIC PANEL - Abnormal; Notable for the following:       Result Value   Glucose, Bld 104 (*)    All other components within normal limits  CBC - Abnormal; Notable for the following:    RDW 15.4 (*)    All other components within normal limits  TROPONIN I  TROPONIN I  TROPONIN I   ____________________________________________  EKG  ED ECG REPORT I, Lavonia Drafts, the attending physician, personally viewed and interpreted this ECG.  Date:  05/21/2016  Rate: 74 Rhythm: normal sinus rhythm QRS Axis: normal Intervals: normal ST/T Wave abnormalities: normal Conduction Disturbances: none Narrative Interpretation: unremarkable  ____________________________________________  RADIOLOGY  Chest x-ray unremarkable ____________________________________________   PROCEDURES  Procedure(s) performed: No    Critical Care performed: No ____________________________________________   INITIAL IMPRESSION / ASSESSMENT AND PLAN / ED COURSE  Pertinent labs & imaging results that were available during my care of the patient were reviewed by me and considered in my medical decision making (see chart for details).  Patient overall well-appearing in the emergency department. She is already taken aspirin today. We will try Nitropaste as well as give her morphine for pain. She is high risk given her cardiac history. I'll admit to the hospitalist for further management    ____________________________________________   FINAL CLINICAL IMPRESSION(S) / ED DIAGNOSES  Final diagnoses:  Chest pain, unspecified type      NEW MEDICATIONS STARTED DURING THIS VISIT:  Current Discharge Medication List       Note:  This document was prepared using Dragon voice recognition software and may include unintentional dictation errors.    Lavonia Drafts, MD 05/21/16 [phone]

## 2016-05-22 ENCOUNTER — Observation Stay (HOSPITAL_BASED_OUTPATIENT_CLINIC_OR_DEPARTMENT_OTHER)
Admit: 2016-05-22 | Discharge: 2016-05-22 | Disposition: A | Discharge status: Home / Self Care | Payer: BC Managed Care – PPO | Attending: Internal Medicine | Admitting: Internal Medicine

## 2016-05-22 DIAGNOSIS — G459 Transient cerebral ischemic attack, unspecified: Secondary | ICD-10-CM

## 2016-05-22 LAB — BASIC METABOLIC PANEL
Anion gap: 11 (ref 5–15)
BUN: 14 mg/dL (ref 6–20)
CO2: 24 mmol/L (ref 22–32)
Calcium: 9.1 mg/dL (ref 8.9–10.3)
Chloride: 106 mmol/L (ref 101–111)
Creatinine, Ser: 0.87 mg/dL (ref 0.44–1.00)
GFR calc Af Amer: >60 mL/min (ref >60)
GFR calc non Af Amer: >60 mL/min (ref >60)
Glucose, Bld: 97 mg/dL (ref 65–99)
Potassium: 3.9 mmol/L (ref 3.5–5.1)
Sodium: 141 mmol/L (ref 135–145)

## 2016-05-22 LAB — CBC
HCT: 38.4 % (ref 35.0–47.0)
Hemoglobin: 12.6 g/dL (ref 12.0–16.0)
MCH: 28.2 pg (ref 26.0–34.0)
MCHC: 32.7 g/dL (ref 32.0–36.0)
MCV: 86 fL (ref 80.0–100.0)
Platelets: 282 10*3/uL (ref 150–440)
RBC: 4.46 MIL/uL (ref 3.80–5.20)
RDW: 14.7 % — ABNORMAL HIGH (ref 11.5–14.5)
WBC: 8 10*3/uL (ref 3.6–11.0)

## 2016-05-22 MED ORDER — AMLODIPINE BESYLATE 5 MG PO TABS: 5.0000 mg | ORAL_TABLET | Freq: Every day | ORAL | 0 refills | Status: DC

## 2016-05-22 MED ORDER — GUAIFENESIN-DM 100-10 MG/5ML PO SYRP
5.0000 mL | ORAL_SOLUTION | ORAL | Status: DC | PRN
  Administered 2016-05-22: 5 mL via ORAL
  Filled 2016-05-22: qty 5

## 2016-05-22 NOTE — Progress Notes (Signed)
    Has ruled out. Please see recommendations form cardiology consult.

## 2016-05-22 NOTE — Discharge Summary (Signed)
[facility] [place] NAME: Brenda Craig    MR#:  [id number]  DATE OF BIRTH:  07-15-1960  DATE OF ADMISSION:  05/21/2016 ADMITTING PHYSICIAN: Loletha Grayer, MD  DATE OF DISCHARGE: 05/22/2016  PRIMARY CARE PHYSICIAN: Albina Billet, MD    ADMISSION DIAGNOSIS:  Vision loss of right eye [H54.61] Chest pain [R07.9] Chest pain, unspecified type [R07.9]  DISCHARGE DIAGNOSIS:  Principal Problem:   Atypical chest pain Active Problems:   CAD in native artery   Back pain   S/P coronary artery stent placement   Hyperlipidemia   Anxiety   Celiac disease   Essential hypertension   Transient blindness of right eye   OA (osteoarthritis) of knee   SECONDARY DIAGNOSIS:   Past Medical History:  Diagnosis Date  . Acid reflux 01/17/2013  . Asthma   . Celiac disease   . CHF (congestive heart failure) ([place])   . Coronary artery disease   . Heart trouble   . History of cardiac arrest   . Hypercholesteremia   . Hypertension   . MI (myocardial infarction) [facility])     HOSPITAL COURSE:   1. Chest pain. Patient with history of coronary artery disease. 3 troponin is negative.   Seen by cardiology Dr. Fletcher Anon - ruled out acute CAD Continue aspirin, Brilinta, Coreg and Crestor.  CT chest angiogram is negative. 2. Accelerated hypertension. Nitropaste ordered.   Added Norvasc. Continue to monitor closely.   BP is under control now. 3. Transient vision loss right eye    Negative CT angios of the carotids. negative echocardiogram with bubble study. This could be an ocular migraine also.   For the same complain , she had MRI brain in 2015- negative.   Symptoms better now.   I suggested to follow with neurologist in office. No other focal neurological symptoms. 4. Chronic cough. On inhaler and Singulair 5. GERD on PPI 6. No signs of congestive heart failure  DISCHARGE CONDITIONS:   Stable.  CONSULTS OBTAINED:  Treatment Team:  Wellington Hampshire, MD  DRUG ALLERGIES:   Allergies  Allergen Reactions  . Codeine Nausea And Vomiting and Rash  . Nexium [Esomeprazole Magnesium] Palpitations  . Omeprazole Magnesium Palpitations  . Oxycodone-Acetaminophen Itching, Nausea And Vomiting and Rash  . Percocet [Oxycodone-Acetaminophen] Itching, Nausea And Vomiting and Rash    DISCHARGE MEDICATIONS:   Current Discharge Medication List    START taking these medications   Details  amLODipine (NORVASC) 5 MG tablet Take 1 tablet (5 mg total) by mouth daily. Qty: 30 tablet, Refills: 0      CONTINUE these medications which have NOT CHANGED   Details  aspirin EC 81 MG tablet Take 81 mg by mouth daily.    budesonide-formoterol (SYMBICORT) 80-4.5 MCG/ACT inhaler Inhale 2 puffs into the lungs 2 (two) times daily. Qty: 1 Inhaler, Refills: 12    carvedilol (COREG) 3.125 MG tablet Take 3.125 mg by mouth 2 (two) times daily with a meal.     cetirizine (ZYRTEC) 10 MG tablet Take 1 tablet (10 mg total) by mouth daily. Qty: 30 tablet, Refills: 5    clotrimazole-betamethasone (LOTRISONE) cream APPLY TO AFFECTED AND SURROUNDING AREAS TWICE A DAY IN THE MORNING AND EVENING FOR 14 DAYS Qty: 45 g, Refills: 0    Dextromethorphan-Guaifenesin 5-100 MG/5ML LIQD Take 10 mLs by mouth 2 (two) times daily. Qty: 237 mL, Refills: 10    diazepam (VALIUM) 5 MG tablet Take 1 tablet (5 mg  total) by mouth every 12 (twelve) hours as needed for anxiety. Qty: 10 tablet, Refills: 0    HYDROcodone-acetaminophen (NORCO/VICODIN) 5-325 MG tablet Take 1 tablet by mouth every 8 (eight) hours as needed. Refills: 0    isosorbide mononitrate (IMDUR) 30 MG 24 hr tablet Take 15 mg by mouth 2 (two) times daily.     LORazepam (ATIVAN) 1 MG tablet Take 1 mg by mouth 2 (two) times daily.     montelukast (SINGULAIR) 10 MG tablet Take 10 mg by mouth at bedtime.    NITROSTAT 0.4 MG SL tablet Place 0.4 mg under the tongue every 5 (five) minutes as needed for chest  pain.     ondansetron (ZOFRAN-ODT) 4 MG disintegrating tablet Take 1 tablet (4 mg total) by mouth every 8 (eight) hours as needed for nausea or vomiting. Qty: 30 tablet, Refills: 0    pantoprazole (PROTONIX) 20 MG tablet Take 40 mg by mouth 2 (two) times daily.    PROAIR HFA 108 (90 Base) MCG/ACT inhaler Inhale 2 puffs into the lungs every 6 (six) hours as needed. Refills: 5    ranitidine (ZANTAC) 150 MG tablet Take 150 mg by mouth daily.    rosuvastatin (CRESTOR) 10 MG tablet Take 10 mg by mouth at bedtime.     ticagrelor (BRILINTA) 60 MG TABS tablet Take 60 mg by mouth 2 (two) times daily.     sertraline (ZOLOFT) 50 MG tablet Take 50 mg by mouth at bedtime.          DISCHARGE INSTRUCTIONS:    Follow with neurologist in office.  If you experience worsening of your admission symptoms, develop shortness of breath, life threatening emergency, suicidal or homicidal thoughts you must seek medical attention immediately by calling 911 or calling your MD immediately  if symptoms less severe.  You Must read complete instructions/literature along with all the possible adverse reactions/side effects for all the Medicines you take and that have been prescribed to you. Take any new Medicines after you have completely understood and accept all the possible adverse reactions/side effects.   Please note  You were cared for by a hospitalist during your hospital stay. If you have any questions about your discharge medications or the care you received while you were in the hospital after you are discharged, you can call the unit and asked to speak with the hospitalist on call if the hospitalist that took care of you is not available. Once you are discharged, your primary care physician will handle any further medical issues. Please note that NO REFILLS for any discharge medications will be authorized once you are discharged, as it is imperative that you return to your primary care physician (or  establish a relationship with a primary care physician if you do not have one) for your aftercare needs so that they can reassess your need for medications and monitor your lab values.    Today   CHIEF COMPLAINT:   Chief Complaint  Patient presents with  . Chest Pain    HISTORY OF PRESENT ILLNESS:  Brenda Craig  is a 56 y.o. female with a known history of heart disease. She presents to the ER with chest pain. Yesterday she wasn't feeling well. She was nauseous and tired and had some ringing in her ears. She went to bed. She was awakened at 11:58 PM with sharp pain in the chest that radiated straight through to her back. She took 3 nitroglycerin and 4 aspirin and a Gas-X. She was unable  to sleep. Her blood pressure was high this morning. This morning her pain is rated as a pressure 8 out of 10 intensity left side of the chest. It's been constant since last evening but it comes and goes in severity. It never goes away. She also has a headache with the nitroglycerin. Of note over the weekend she did have a transient blindness in her right eye lasting 6 minutes in duration. 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   VITAL SIGNS:  Blood pressure (!) 96/54, pulse 70, temperature 99 F (37.2 C), temperature source Oral, resp. rate 18, height 5' 5"  (1.651 m), weight 92 kg (202 lb 12.8 oz), SpO2 93 %.  I/O:   Intake/Output Summary (Last 24 hours) at 05/22/16 1409 Last data filed at 05/22/16 1331  Gross per 24 hour  Intake           1562.5 ml  Output             1000 ml  Net            562.5 ml    PHYSICAL EXAMINATION:  GENERAL:  56 y.o.-year-old patient lying in the bed with no acute distress.  EYES: Pupils equal, round, reactive to light and accommodation. No scleral icterus. Extraocular muscles intact.  HEENT: Head atraumatic, normocephalic. Oropharynx and nasopharynx clear.  NECK:  Supple, no jugular venous distention. No thyroid enlargement, no tenderness.   LUNGS: Normal breath sounds bilaterally, no wheezing, rales,rhonchi or crepitation. No use of accessory muscles of respiration.  CARDIOVASCULAR: S1, S2 normal. No murmurs, rubs, or gallops.  ABDOMEN: Soft, non-tender, non-distended. Bowel sounds present. No organomegaly or mass.  EXTREMITIES: No pedal edema, cyanosis, or clubbing.  NEUROLOGIC: Cranial nerves II through XII are intact. Muscle strength 5/5 in all extremities. Sensation intact. Gait not checked.  PSYCHIATRIC: The patient is alert and oriented x 3.  SKIN: No obvious rash, lesion, or ulcer.   DATA REVIEW:   CBC  Recent Labs Lab 05/22/16 0548  WBC 8.0  HGB 12.6  HCT 38.4  PLT 282    Chemistries   Recent Labs Lab 05/22/16 0548  NA 141  K 3.9  CL 106  CO2 24  GLUCOSE 97  BUN 14  CREATININE 0.87  CALCIUM 9.1    Cardiac Enzymes  Recent Labs Lab 05/21/16 2018  TROPONINI <0.03    Microbiology Results  No results found for this or any previous visit.  RADIOLOGY:  Dg Chest 2 View  Result Date: 05/21/2016 CLINICAL DATA:  Chest pain EXAM: CHEST  2 VIEW COMPARISON:  02/20/2016 FINDINGS: Linear areas of scarring in the lung bases, stable. Heart is normal size. No effusions. No acute bony abnormality. IMPRESSION: Bibasilar scarring.  No active disease. Electronically Signed   By: Rolm Baptise M.D.   On: 05/21/2016 12:56   Ct Angio Neck W Or Wo Contrast  Result Date: 05/21/2016 CLINICAL DATA:  Feeling unwell since yesterday, woke up with chest pain radiating to back today and hypertension. 6 minutes transient RIGHT eye blindness this weekend. History of CHF, hypertension, hyperlipidemia. EXAM: CT ANGIOGRAPHY NECK TECHNIQUE: Multidetector CT imaging of the neck was performed using the standard protocol during bolus administration of intravenous contrast. Multiplanar CT image reconstructions and MIPs were obtained to evaluate the vascular anatomy. Carotid stenosis measurements (when applicable) are obtained  utilizing NASCET criteria, using the distal internal carotid diameter as the denominator. CONTRAST:  125 cc Isovue 370 COMPARISON:  CT HEAD September 24, 2013  FINDINGS: AORTIC ARCH: Normal appearance of the thoracic arch, normal branch pattern. The origins of the innominate, left Common carotid artery and subclavian artery are widely patent. RIGHT CAROTID SYSTEM: Common carotid artery is widely patent, proximal fold associated with chronic hypertension. Normal appearance of the carotid bifurcation without hemodynamically significant stenosis by NASCET criteria. Normal appearance of the included internal carotid artery. LEFT CAROTID SYSTEM: Common carotid artery is widely patent, multiple folds associated with chronic hypertension. Normal appearance of the carotid bifurcation without hemodynamically significant stenosis by NASCET criteria. Normal appearance of the included internal carotid artery. VERTEBRAL ARTERIES:RIGHT vertebral artery is dominant. Normal appearance of the vertebral arteries, which appear widely patent. SKELETON: No acute osseous process though bone windows have not been submitted. Longus coli insertional enthesopathy. OTHER NECK: Soft tissues of the neck are nonacute though, not tailored for evaluation. Subcentimeter LEFT thyroid nodule below size followup recommendation. IMPRESSION: No hemodynamically significant stenosis or acute vascular process. Electronically Signed   By: Elon Alas M.D.   On: 05/21/2016 14:37   Ct Angio Chest Pe W Or Wo Contrast  Result Date: 05/21/2016 CLINICAL DATA:  Chest pain nausea EXAM: CT ANGIOGRAPHY CHEST WITH CONTRAST TECHNIQUE: Multidetector CT imaging of the chest was performed using the standard protocol during bolus administration of intravenous contrast. Multiplanar CT image reconstructions and MIPs were obtained to evaluate the vascular anatomy. CONTRAST:  125 mL Isovue 370 intravenous COMPARISON:  Radiograph 05/21/2016, CT chest 08/22/2015  FINDINGS: Cardiovascular: Satisfactory opacification of the pulmonary arteries to the segmental level. No evidence of pulmonary embolism. Ectasia of the ascending segment of the aorta, measuring up to 3.9 cm. No dissection is seen. There are coronary artery calcifications. Small amount of fluid in the superior pericardial recess. Heart size within normal limits. Mediastinum/Nodes: Midline trachea. No evidence for a thyroid mass. No significantly enlarged mediastinal lymph nodes. Esophagus shows a small distal hiatal hernia. Lungs/Pleura: Lungs are clear. No pleural effusion or pneumothorax. Scattered atelectasis or scarring within the lung bases, lingula and right middle lobe. Upper Abdomen: Small accessory splenules anteriorly. Partially visualized 4.9 cm cyst midpole right kidney, 1 cm cyst upper pole right kidney. Focal scarring upper pole left kidney. Musculoskeletal: No chest wall abnormality. No acute or significant osseous findings. Review of the MIP images confirms the above findings. IMPRESSION: 1. Negative for acute pulmonary embolus. Mildly ectatic ascending aorta. No dissection is seen. 2. Clear lung fields. 3. Cysts in the right kidney. Electronically Signed   By: Donavan Foil M.D.   On: 05/21/2016 14:42    EKG:   Orders placed or performed during the hospital encounter of 05/21/16  . EKG 12-Lead  . EKG 12-Lead  . ED EKG within 10 minutes  . ED EKG within 10 minutes      Management plans discussed with the patient, family and they are in agreement.  CODE STATUS:     Code Status Orders        Start     Ordered   05/21/16 1525  Full code  Continuous     05/21/16 1525    Code Status History    Date Active Date Inactive Code Status Order ID Comments User Context   12/20/2015 10:13 PM 12/21/2015  4:03 PM Full Code [id number]  Nicholes Mango, MD ED   08/22/2015 11:41 AM 08/23/2015  9:07 AM Full Code [id number]  Vaughan Basta, MD Inpatient   10/08/2014  8:16 PM 10/10/2014  3:06  PM Full Code [id number]  Epifanio Lesches, MD ED  TOTAL TIME TAKING CARE OF THIS PATIENT: 35 minutes.    Vaughan Basta M.D on 05/22/2016 at 2:09 PM  Between 7am to 6pm - Pager - [phone]  After 6pm go to [email or web address] - password [email or web address] [facility]  Office  [phone]  CC: Primary care physician; Albina Billet, MD   Note: This dictation was prepared with Dragon dictation along with smaller phrase technology. Any transcriptional errors that result from this process are unintentional.

## 2016-05-22 NOTE — Progress Notes (Signed)
Discharge instructions explained to pt/ verbalized an understanding./ iv and tele removed/ rx given to pt/ will transport off unit when ride arrives

## 2016-05-22 NOTE — Progress Notes (Signed)
*  PRELIMINARY RESULTS* Echocardiogram 2D Echocardiogram has been performed.  Brenda Craig 05/22/2016, 8:56 AM

## 2016-06-26 ENCOUNTER — Ambulatory Visit (INDEPENDENT_AMBULATORY_CARE_PROVIDER_SITE_OTHER): Payer: BC Managed Care – PPO | Admitting: Internal Medicine

## 2016-06-26 ENCOUNTER — Encounter: Payer: Self-pay | Admitting: Internal Medicine

## 2016-06-26 VITALS — BP 120/88 | HR 75 | Ht 65.0 in | Wt 202.0 lb

## 2016-06-26 DIAGNOSIS — J4520 Mild intermittent asthma, uncomplicated: Secondary | ICD-10-CM | POA: Diagnosis not present

## 2016-06-26 DIAGNOSIS — G4719 Other hypersomnia: Secondary | ICD-10-CM | POA: Diagnosis not present

## 2016-06-26 MED ORDER — PREDNISONE 20 MG PO TABS: 20.0000 mg | ORAL_TABLET | Freq: Every day | ORAL | 1 refills | Status: DC

## 2016-06-26 MED ORDER — FLUTICASONE PROPIONATE 50 MCG/ACT NA SUSP: 1.0000 | Freq: Every day | NASAL | 2 refills | Status: DC

## 2016-06-26 NOTE — Progress Notes (Signed)
PULMONARY CONSULT NOTE  Requesting MD/Service: Benita Stabile Date of initial consultation: 05/15/16 Reason for consultation: Chronic cough  PT PROFILE: 56 y.o. female never smoker referred for chronic cough. Initial impression: all 3 components of cough triad with probable component of cyclical cough. PPI dose increased, ICS/LABA changed, antihistamine and cough suppressant initiated.   PREVIOUS HPI Ms. Menser is referred for evaluation of chronic cough of many years duration. She dates it back to 2009 or 2010. Her cough has been intermittent but has to be worse in the spring autumn and winter. It has been particularly bad in the last couple of years. She reports post tussive emesis on occasion. It tends to be worse during the night and postprandial. She saw pulmonologist at [facility] 2009 and was told that she has asthma. At that time she was started on Singulair with modest improvement. In 2015 she was started on Symbicort which "helped a lot". However, because of the payer issues, she was changed from Symbicort to [facility] which does not seem to control her cough as well. Other triggers include meals, bending over, excessive heat and exercise. She has a diagnosis of gastroesophageal reflux. She presently takes proton Milligrams twice a day and ranitidine at bedtime. She describes moderate exertional dyspnea, limited to a single flight of stairs or a couple of 100 yards on flat ground. She has a prior history of myocardial infarction. She suffered a cardiac arrest in 2013. She does describe nasal congestion and chronic throat irritation.Denies CP, fever, purulent sputum, hemoptysis, LE edema and calf tenderness.    CC follow up ASTHMA  HPI Patient restarted on Symbicort 80 twice a day which seems to help her symptoms Patient still has persistent intermittent wheezing and cough Has been prescribed amoxicillin and cough syrup for the last 10 days No infections at this  time  seen today for problems with sleep Patient  has been having sleep problems for over one year Patient has been having excessive daytime sleepiness Patient has been having extreme fatigue and tiredness, lack of energy Patient has been told that she has very  Loud snoring every night Patient has been told that she struggles to breathe at night and gasps for air Epworth sleep score 11  She has gained 40 pounds over the last 5 years Her reflux is under control with Protonix Patient has allergic rhinitis as well   ROS: No fever, myalgias/arthralgias, unexplained weight loss or weight gain No new focal weakness or sensory deficits No otalgia, hearing loss, visual changes No neck pain or adenopathy No abdominal pain, N/V/D, diarrhea, change in bowel pattern No dysuria, change in urinary pattern + Cough + wheezing  BP 120/88 (BP Location: Left Arm, Cuff Size: Normal)   Pulse 75   Ht 5' 5"  (1.651 m)   Wt 202 lb (91.6 kg)   SpO2 95%   BMI 33.61 kg/m    EXAM:  Gen: Moderately obese, No overt respiratory distress, coughing frequently and vigorously HEENT: NCAT, sclera white, mild rhinitis with mucus crusting, moderate pharyngeal erythema Neck: Supple without LAN, thyromegaly, JVD Lungs: breath sounds full with normal percussion note and no adventitious sounds Cardiovascular: RRR, no murmurs noted Abdomen: Soft, nontender, normal BS Ext: without clubbing, cyanosis, edema Neuro: CNs grossly intact, motor and sensory intact Skin: Limited exam, no lesions noted  DATA:   BMP Latest Ref Rng & Units 05/22/2016 05/21/2016 12/21/2015  Glucose 65 - 99 mg/dL 97 104(H) 104(H)  BUN 6 - 20 mg/dL 14 16  12  Creatinine 0.44 - 1.00 mg/dL 0.87 0.77 0.89  Sodium 135 - 145 mmol/L 141 141 140  Potassium 3.5 - 5.1 mmol/L 3.9 3.6 3.8  Chloride 101 - 111 mmol/L 106 108 107  CO2 22 - 32 mmol/L 24 25 27   Calcium 8.9 - 10.3 mg/dL 9.1 9.1 8.9    CBC Latest Ref Rng & Units 05/22/2016 05/21/2016  12/21/2015  WBC 3.6 - 11.0 K/uL 8.0 9.0 5.7  Hemoglobin 12.0 - 16.0 g/dL 12.6 12.9 12.3  Hematocrit 35.0 - 47.0 % 38.4 38.3 36.8  Platelets 150 - 440 K/uL 282 261 265     IMPRESSION:   56 year old obese pleasant white female with moderate persistent asthma in the setting of obesity and deconditioned state associated with allergic rhinitis also with signs and symptoms of excessive daytime sleepiness and loud snoring which  is suggestive of obstructive sleep apnea  #1 shortness of breath and wheezing related to moderate persistent asthma with obesity and deconditioned state  #2 moderate persistent asthma Symbicort increased to 160 twice daily Albuterol 2-4 puffs every 4 hours as needed Avoid allergens  #3 allergic rhinitis Patient has tried Zyrtec and did not work Will start nasal therapy with Flonase  #4 excessive daytime sleepiness and loud snoring probable OSA Patient will need sleep study for definitive diagnosis  #5 Obesity -recommend significant weight loss -recommend changing diet  #6 Deconditioned state -Recommend increased daily activity and exercise  #7 GERD Continue PPI   Patient/Family are satisfied with Plan of action and management. All questions answered Follow-up in 3-6 months with Dr. Diona Fanti, M.D.  [facility]  Medical Director [place] Director [facility] Cardio-Pulmonary Department

## 2016-06-26 NOTE — Patient Instructions (Signed)
Continue inhalers as prescribed increase Symbicort to 160 twice daily Albuterol 2-4 puffs every 4 hours as needed Continue Protonix Start Flonase prescribe 20 mg prednisone daily for 10 days one refill Sleep study needed to rule out sleep apnea

## 2016-11-23 ENCOUNTER — Other Ambulatory Visit: Payer: Self-pay | Admitting: Pulmonary Disease

## 2016-11-23 NOTE — Telephone Encounter (Signed)
Electronic rx refill for Zyrtec with 3 refills.ss

## 2016-12-07 ENCOUNTER — Ambulatory Visit: Payer: BC Managed Care – PPO | Attending: Internal Medicine

## 2016-12-07 DIAGNOSIS — G4761 Periodic limb movement disorder: Secondary | ICD-10-CM | POA: Diagnosis not present

## 2016-12-07 DIAGNOSIS — G4733 Obstructive sleep apnea (adult) (pediatric): Secondary | ICD-10-CM | POA: Diagnosis not present

## 2016-12-07 DIAGNOSIS — G4710 Hypersomnia, unspecified: Secondary | ICD-10-CM | POA: Diagnosis present

## 2016-12-10 DIAGNOSIS — G4733 Obstructive sleep apnea (adult) (pediatric): Secondary | ICD-10-CM | POA: Diagnosis not present

## 2016-12-11 ENCOUNTER — Telehealth: Payer: Self-pay | Admitting: *Deleted

## 2016-12-11 DIAGNOSIS — G4733 Obstructive sleep apnea (adult) (pediatric): Secondary | ICD-10-CM

## 2016-12-11 NOTE — Telephone Encounter (Signed)
Left message to return call for results of sleep study.

## 2016-12-11 NOTE — Telephone Encounter (Signed)
-----   Message from Laverle Hobby, MD sent at 12/10/2016  5:16 PM EST ----- Regarding: sleep study results.  AHI was 21.  Recommend auto-CPAP with pressure range of 5-10 cm H2O. ResMed Airfit N20 Nasal Mask, Small

## 2016-12-12 NOTE — Telephone Encounter (Signed)
Patient returned call. Results given and orders placed. Nothing further needed.

## 2016-12-12 NOTE — Telephone Encounter (Signed)
2nd message left.

## 2017-02-05 ENCOUNTER — Ambulatory Visit: Payer: BC Managed Care – PPO | Admitting: Internal Medicine

## 2017-02-05 ENCOUNTER — Encounter: Payer: Self-pay | Admitting: Internal Medicine

## 2017-02-05 VITALS — BP 122/82 | HR 78 | Ht 65.0 in | Wt 194.0 lb

## 2017-02-05 DIAGNOSIS — J4520 Mild intermittent asthma, uncomplicated: Secondary | ICD-10-CM | POA: Diagnosis not present

## 2017-02-05 DIAGNOSIS — G4733 Obstructive sleep apnea (adult) (pediatric): Secondary | ICD-10-CM | POA: Diagnosis not present

## 2017-02-05 MED ORDER — BUDESONIDE-FORMOTEROL FUMARATE 160-4.5 MCG/ACT IN AERO: 2.0000 | INHALATION_SPRAY | Freq: Two times a day (BID) | RESPIRATORY_TRACT | 12 refills | Status: DC

## 2017-02-05 NOTE — Patient Instructions (Signed)
Mask fitting assessment in [place] Increase Symbicort to 160 Use Albuterol 2-4 puffs every 6 hrs as needed Continue meds as prescribed

## 2017-02-05 NOTE — Progress Notes (Signed)
PULMONARY CONSULT NOTE  Requesting MD/Service: Benita Stabile Date of initial consultation: 05/15/16 Reason for consultation: Chronic cough  PT PROFILE: 57 y.o. female never smoker referred for chronic cough. Initial impression: all 3 components of cough triad with probable component of cyclical cough. PPI dose increased, ICS/LABA changed, antihistamine and cough suppressant initiated.   PREVIOUS HPI Brenda Craig is referred for evaluation of chronic cough of many years duration. She dates it back to 2009 or 2010. Her cough has been intermittent but has to be worse in the spring autumn and winter. It has been particularly bad in the last couple of years. She reports post tussive emesis on occasion. It tends to be worse during the night and postprandial. She saw pulmonologist at [facility] 2009 and was told that she has asthma. At that time she was started on Singulair with modest improvement. In 2015 she was started on Symbicort which "helped a lot". However, because of the payer issues, she was changed from Symbicort to [facility] which does not seem to control her cough as well. Other triggers include meals, bending over, excessive heat and exercise. She has a diagnosis of gastroesophageal reflux. She presently takes proton Milligrams twice a day and ranitidine at bedtime. She describes moderate exertional dyspnea, limited to a single flight of stairs or a couple of 100 yards on flat ground. She has a prior history of myocardial infarction. She suffered a cardiac arrest in 2013. She does describe nasal congestion and chronic throat irritation.Denies CP, fever, purulent sputum, hemoptysis, LE edema and calf tenderness.    CC follow up ASTHMA  HPI Patient restarted on Symbicort 80 twice a day which seems to help her symptoms but was asked to increased to 160 at last visit and she did not  Patient still has persistent intermittent wheezing and cough Has been prescribed  amoxicillin/prednisnone day 5 of 10  For URI and bronchitis  +DX of OSA AHI was 21  Compliance report pending Has problems with mask Not getting humidifier due to insurance policy  She has gained 40 pounds over the last 5 years Her reflux is under control with Protonix Patient has allergic rhinitis as well-flonase did not help On zyrtec daily and singulair-both help +exposure to cats and dogs   ROS: No fever, myalgias/arthralgias, unexplained weight loss or weight gain No new focal weakness or sensory deficits No otalgia, hearing loss, visual changes No neck pain or adenopathy No abdominal pain, N/V/D, diarrhea, change in bowel pattern + Cough + wheezing Other ROS negative  BP 122/82 (BP Location: Left Arm, Cuff Size: Normal)   Pulse 78   Ht 5' 5"  (1.651 m)   Wt 194 lb (88 kg)   SpO2 94%   BMI 32.28 kg/m    EXAM:  Gen: Moderately obese, No overt respiratory distress, coughing frequently and vigorously HEENT: NCAT, sclera white, mild rhinitis with mucus crusting, moderate pharyngeal erythema Neck: Supple without LAN, thyromegaly, JVD Lungs: breath sounds full with normal percussion note and no adventitious sounds Cardiovascular: RRR, no murmurs noted Abdomen: Soft, nontender, normal BS Ext: without clubbing, cyanosis, edema Neuro: CNs grossly intact, motor and sensory intact Skin: Limited exam, no lesions noted   IMPRESSION:   57 year old obese pleasant white female with moderate persistent asthma in the setting of obesity and deconditioned state associated with allergic rhinitis also with signs and symptoms of excessive daytime sleepiness and loud snoring which  is suggestive of obstructive sleep apnea  #1 shortness of breath and wheezing related  to moderate persistent asthma with obesity and deconditioned state  #2 moderate persistent asthma Symbicort increased to 160 twice daily Albuterol 2-4 puffs every 4 hours as needed Avoid allergens +asthma exacerbation  with URI-continue abx and prednisone as prescribed  #3 allergic rhinitis Patient on Zyrtec   #4 excessive daytime sleepiness and loud snoring has DX of  OSA Follow up with compliance report at next visit Mask fitting assessment needed  #5 Obesity -recommend significant weight loss -recommend changing diet  #6 Deconditioned state -Recommend increased daily activity and exercise  #7 GERD Continue PPI   Patient satisfied with Plan of action and management. All questions answered Follow-up in 3 months   Gari Trovato Brenda Craig, M.D.  [facility]  Medical Director [place] Director [facility] Cardio-Pulmonary Department

## 2017-02-08 ENCOUNTER — Ambulatory Visit (INDEPENDENT_AMBULATORY_CARE_PROVIDER_SITE_OTHER): Payer: BC Managed Care – PPO | Admitting: Obstetrics and Gynecology

## 2017-02-08 ENCOUNTER — Encounter: Payer: Self-pay | Admitting: Obstetrics and Gynecology

## 2017-02-08 VITALS — BP 114/78 | HR 84 | Ht 65.5 in | Wt 199.0 lb

## 2017-02-08 DIAGNOSIS — L309 Dermatitis, unspecified: Secondary | ICD-10-CM

## 2017-02-08 DIAGNOSIS — R319 Hematuria, unspecified: Secondary | ICD-10-CM

## 2017-02-08 DIAGNOSIS — Z1231 Encounter for screening mammogram for malignant neoplasm of breast: Secondary | ICD-10-CM | POA: Diagnosis not present

## 2017-02-08 DIAGNOSIS — Z01419 Encounter for gynecological examination (general) (routine) without abnormal findings: Secondary | ICD-10-CM | POA: Diagnosis not present

## 2017-02-08 DIAGNOSIS — Z1239 Encounter for other screening for malignant neoplasm of breast: Secondary | ICD-10-CM

## 2017-02-08 MED ORDER — CLOTRIMAZOLE-BETAMETHASONE 1-0.05 % EX CREA: TOPICAL_CREAM | CUTANEOUS | 0 refills | Status: DC

## 2017-02-08 NOTE — Progress Notes (Signed)
PCP: Albina Billet, MD   Chief Complaint  Patient presents with  . Gynecologic Exam    SPOTTING C WIPING; CK TO BE SURE TICK REMOVED COMPLETELY    HPI:      [name] is a 57 y.o. [id number] who LMP was No LMP recorded. Patient has had an ablation., presents today for her annual examination.  Her menses are absent due to ablation and she is postmenopausal. She has noted suprapubic pressure off and on the past 6 wks and then will have subsequent hematuria the next day. She has had 2 neg eval with PCP. She is pretty sure bleeding is from bladder since there is no blood in her underwear. No sx in past wk, however.   She does have vasomotor sx.   Sex activity: not sexually active. She does not have vaginal dryness.  Last Pap: December 28, 2015  Results were: no abnormalities /neg HPV DNA.  Hx of STDs: none  Last mammogram: December 07, 2014  Results were: normal--routine follow-up in 12 months There is no FH of breast cancer. There is no FH of ovarian cancer. The patient does do self-breast exams.  Colonoscopy: colonoscopy 4 years ago with abnormalities.  Repeat due after 5 years.   Tobacco use: The patient denies current or previous tobacco use. Alcohol use: none Exercise: not active  She does not get adequate calcium and Vitamin D in her diet.  Labs with PCP.  She cont to get fungal infections under panus/bilat ing area. She tries to keep area dry but sx are intermittent. She uses gold bond powder to help with moisture control and treats prn with lotrisone crm.   Past Medical History:  Diagnosis Date  . Abnormal Pap smear of cervix 1988  . Acid reflux 01/17/2013  . Allergic rhinitis   . Anemia   . Anxiety   . Asthma   . Celiac disease   . Cervical dysplasia 1988  . CHF (congestive heart failure) ([place])   . Coronary artery disease   . Heart trouble   . History of cardiac arrest   . History of mammogram 03/25/2013; 12/07/14   BIRADS 1; NEG  . History of  Papanicolaou smear of cervix 03/20/12; 12/28/15   -/-; -/-  . Hypercholesteremia   . Hypertension   . Menopausal symptoms   . MI (myocardial infarction) ([place])   . Mitral valve prolapse   . Sleep apnea   . Vulvovaginitis    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    Past Surgical History:  Procedure Laterality Date  . ABLATION  2013   [facility]  . BACK SURGERY    . BACK SURGERY  2016; 2017   L4, L5  . CARDIAC SURGERY  03/19/12; 10/2014   HEART CATH AND STENT  . [place]; 1989  . COLD KNIFE CONE BIOPSY  1988  . COLONOSCOPY  08/2010   16 POLYPS (ALL BENIGN) DX C CELIAC DISEASE  . COMBINED HYSTEROSCOPY DIAGNOSTIC / D&C  2013   [facility]  . DILATION AND CURETTAGE OF UTERUS  2001  . ESOPHAGOGASTRODUODENOSCOPY  08/2010   DX C CELIAC DISEASE  . HEART STENTS  05/27/2011  . HIP SURGERY  2016   RIGHT IT BAND AND BURSA REMOVAL  . OOPHORECTOMY Right 1982    Family History  Problem Relation Age of Onset  . CAD Father   . Transient ischemic attack Father   . Diabetes  Father   . Cerebrovascular Accident Father   . Heart disease Father        M-MVP; F BETA;BLOCKERS  . Hypothyroidism Father   . Hyperthyroidism Mother   . Cancer Maternal Uncle        KIDNEY  . Uterine cancer Maternal Aunt 69    Social History   Socioeconomic History  . Marital status: Divorced    Spouse name: Not on file  . Number of children: 2  . Years of education: 47  . Highest education level: Not on file  Social Needs  . Financial resource strain: Not on file  . Food insecurity - worry: Not on file  . Food insecurity - inability: Not on file  . Transportation needs - medical: Not on file  . Transportation needs - non-medical: Not on file  Occupational History  . Occupation: TEACHER  Tobacco Use  . Smoking status: Never Smoker  . Smokeless tobacco: Never Used  Substance and Sexual Activity  . Alcohol use: No    Frequency: Never  . Drug use: No  . Sexual activity: Not  Currently    Birth control/protection: Surgical    Comment: ABLATION  Other Topics Concern  . Not on file  Social History Narrative  . Not on file    Current Meds  Medication Sig  . amLODipine (NORVASC) 5 MG tablet Take 1 tablet (5 mg total) by mouth daily.  Marland Kitchen aspirin EC 81 MG tablet Take 81 mg by mouth daily.  . budesonide-formoterol (SYMBICORT) 160-4.5 MCG/ACT inhaler Inhale 2 puffs into the lungs 2 (two) times daily.  . carvedilol (COREG) 3.125 MG tablet Take 3.125 mg by mouth 2 (two) times daily with a meal.   . cefUROXime (CEFTIN) 500 MG tablet Take 500 mg by mouth 2 (two) times daily.  . cetirizine (ZYRTEC) 10 MG tablet TAKE 1 TABLET BY MOUTH EVERY DAY  . GUAIFENESIN-CODEINE PO Take 5 mLs by mouth at bedtime as needed.  . isosorbide mononitrate (IMDUR) 30 MG 24 hr tablet Take 15 mg by mouth daily.   Marland Kitchen LORazepam (ATIVAN) 1 MG tablet Take 1 mg by mouth 2 (two) times daily.   . montelukast (SINGULAIR) 10 MG tablet Take 10 mg by mouth at bedtime.  Marland Kitchen NITROSTAT 0.4 MG SL tablet Place 0.4 mg under the tongue every 5 (five) minutes as needed for chest pain.   . pantoprazole (PROTONIX) 20 MG tablet Take 20 mg by mouth 2 (two) times daily.   . predniSONE (DELTASONE) 20 MG tablet Take 1 tablet (20 mg total) by mouth daily with breakfast. 10 days  . PROAIR HFA 108 (90 Base) MCG/ACT inhaler Inhale 2 puffs into the lungs every 6 (six) hours as needed.  . ranitidine (ZANTAC) 150 MG tablet Take 150 mg by mouth daily.  . rosuvastatin (CRESTOR) 10 MG tablet Take 10 mg by mouth at bedtime.   . ticagrelor (BRILINTA) 60 MG TABS tablet Take 60 mg by mouth 2 (two) times daily.       ROS:  Review of Systems  Constitutional: Negative for fatigue, fever and unexpected weight change.  HENT: Positive for congestion and sore throat.   Respiratory: Positive for cough. Negative for shortness of breath and wheezing.   Cardiovascular: Negative for chest pain, palpitations and leg swelling.    Gastrointestinal: Negative for blood in stool, constipation, diarrhea, nausea and vomiting.  Endocrine: Negative for cold intolerance, heat intolerance and polyuria.  Genitourinary: Negative for dyspareunia, dysuria, flank pain, frequency, genital sores, hematuria,  menstrual problem, pelvic pain, urgency, vaginal bleeding, vaginal discharge and vaginal pain.  Musculoskeletal: Negative for back pain, joint swelling and myalgias.  Skin: Positive for rash.  Neurological: Negative for dizziness, syncope, light-headedness, numbness and headaches.  Hematological: Negative for adenopathy.  Psychiatric/Behavioral: Negative for agitation, confusion, sleep disturbance and suicidal ideas. The patient is not nervous/anxious.      Objective: BP 114/78   Pulse 84   Ht 5' 5.5" (1.664 m)   Wt 199 lb (90.3 kg)   BMI 32.61 kg/m    Physical Exam  Constitutional: She is oriented to person, place, and time. She appears well-developed and well-nourished.  Genitourinary: Vagina normal and uterus normal. There is no rash or tenderness on the right labia. There is no rash or tenderness on the left labia. No erythema or tenderness in the vagina. No vaginal discharge found. Right adnexum does not display mass and does not display tenderness. Left adnexum does not display mass and does not display tenderness. Cervix does not exhibit motion tenderness or polyp. Uterus is not enlarged or tender.  Neck: Normal range of motion. No thyromegaly present.  Cardiovascular: Normal rate, regular rhythm and normal heart sounds.  No murmur heard. Pulmonary/Chest: Effort normal and breath sounds normal. Right breast exhibits no mass, no nipple discharge, no skin change and no tenderness. Left breast exhibits no mass, no nipple discharge, no skin change and no tenderness.  Abdominal: Soft. There is no tenderness. There is no guarding.  Musculoskeletal: Normal range of motion.  Neurological: She is alert and oriented to person,  place, and time. No cranial nerve deficit.  Skin:  FUNGAL ERYTHEMA/D/C UNDER PANUS/ BILAT ING AREAS  Psychiatric: She has a normal mood and affect. Her behavior is normal.  Vitals reviewed.   Assessment/Plan:  Encounter for annual routine gynecological examination  Screening for breast cancer - Pt to sched mammo - Plan: MM DIGITAL SCREENING BILATERAL  Dermatitis - Keep skin dry/Rx RF lotrison crm. F/u prn. - Plan: clotrimazole-betamethasone (LOTRISONE) cream  Hematuria, unspecified type - Pt to verify sx aren't coming from vaginal area. If they are, pt needs GYN u/s. Offered to check pt vaginally when sx recur. If not from vagina, pt to f/u uro   Meds ordered this encounter  Medications  . clotrimazole-betamethasone (LOTRISONE) cream    Sig: APPLY TO AFFECTED AND SURROUNDING AREAS TWICE A DAY IN THE MORNING AND EVENING PRN SX UTP TO 4 DAYS    Dispense:  45 g    Refill:  0    Order Specific Question:   Supervising Provider    Answer:   Gae Dry [[id number]]         GYN counsel breast self exam, mammography screening, menopause, adequate intake of calcium and vitamin D, diet and exercise    F/U  Return in about 1 year (around 02/08/2018).  Alicia B. Copland, PA-C 02/08/2017 10:25 AM

## 2017-02-08 NOTE — Patient Instructions (Addendum)
I value your feedback and entrusting us with your care. If you get a [facility] [address] patient survey, I would appreciate you taking the time to let us know about your experience today. Thank you!  [facility]: [phone]  [facility]: [phone]  

## 2017-02-26 ENCOUNTER — Ambulatory Visit (HOSPITAL_BASED_OUTPATIENT_CLINIC_OR_DEPARTMENT_OTHER): Payer: BC Managed Care – PPO | Attending: Internal Medicine | Admitting: Radiology

## 2017-02-26 DIAGNOSIS — G4733 Obstructive sleep apnea (adult) (pediatric): Secondary | ICD-10-CM

## 2017-04-02 ENCOUNTER — Other Ambulatory Visit: Payer: Self-pay | Admitting: Pulmonary Disease

## 2017-04-15 ENCOUNTER — Telehealth: Payer: Self-pay | Admitting: Internal Medicine

## 2017-04-15 NOTE — Telephone Encounter (Signed)
LMOV TO R/S APPT

## 2017-04-16 ENCOUNTER — Ambulatory Visit: Payer: BC Managed Care – PPO | Admitting: Internal Medicine

## 2017-04-17 ENCOUNTER — Encounter: Payer: Self-pay | Admitting: Internal Medicine

## 2017-04-17 ENCOUNTER — Ambulatory Visit: Payer: BC Managed Care – PPO | Admitting: Internal Medicine

## 2017-04-17 VITALS — BP 100/60 | HR 72 | Resp 16 | Ht 65.5 in | Wt 196.0 lb

## 2017-04-17 DIAGNOSIS — R05 Cough: Secondary | ICD-10-CM | POA: Diagnosis not present

## 2017-04-17 DIAGNOSIS — J45909 Unspecified asthma, uncomplicated: Secondary | ICD-10-CM

## 2017-04-17 DIAGNOSIS — G4733 Obstructive sleep apnea (adult) (pediatric): Secondary | ICD-10-CM

## 2017-04-17 DIAGNOSIS — R053 Chronic cough: Secondary | ICD-10-CM

## 2017-04-17 MED ORDER — BUDESONIDE 0.5 MG/2ML IN SUSP: 0.5000 mg | Freq: Two times a day (BID) | RESPIRATORY_TRACT | 12 refills | Status: DC

## 2017-04-17 NOTE — Addendum Note (Signed)
Addended by: Stephanie Coup on: 04/17/2017 03:33 PM   Modules accepted: Orders

## 2017-04-17 NOTE — Progress Notes (Signed)
PULMONARY NOTE  Requesting MD/Service: Benita Stabile Date of initial consultation: 05/15/16 Reason for consultation: Chronic cough  PT PROFILE: 57 y.o. female never smoker referred for chronic cough. Initial impression: all 3 components of cough triad with probable component of cyclical cough. PPI dose increased, ICS/LABA changed, antihistamine and cough suppressant initiated.   PREVIOUS HPI [name] is referred for evaluation of chronic cough of many years duration. She dates it back to 2009 or 2010. Her cough has been intermittent but has to be worse in the spring autumn and winter. It has been particularly bad in the last couple of years. She reports post tussive emesis on occasion. It tends to be worse during the night and postprandial. She saw pulmonologist at [facility] 2009 and was told that she has asthma. At that time she was started on Singulair with modest improvement. In 2015 she was started on Symbicort which "helped a lot". However, because of the payer issues, she was changed from Symbicort to [facility] which does not seem to control her cough as well. Other triggers include meals, bending over, excessive heat and exercise. She has a diagnosis of gastroesophageal reflux. She presently takes proton. Milligrams twice a day and ranitidine at bedtime. She describes moderate exertional dyspnea, limited to a single flight of stairs or a couple of 100 yards on flat ground. She has a prior history of myocardial infarction. She suffered a cardiac arrest in 2013. She does describe nasal congestion and chronic throat irritation.Denies CP, fever, purulent sputum, hemoptysis, LE edema and calf tenderness.    CC follow up ASTHMA  HPI She has been having ongoing issues with asthma, cough, laryngitis for about the past 6 months. She has sensation that she has mucus that wont come out. She has been on symbicort 2 puffs bid, proair about once per day. Multiple courses of prednisone  which have helped a lot. She takes protonix for reflux. She is on zyrtec and singulair.   +DX of OSA AHI was 21  Compliance report, 90 days as of 04/17/17.  Usage greater than 4 hours is 65/90 days.  Average usage on days used is 7 hours 31 minutes.  Pressure ranges 5-10, PA pressure 6, 95th percentile pressure is 9, maximum pressure is 10.  Residual AHI is 2.1 She had initial difficulty with the mask, went to mask fitting clinic and has been doing well since then.  She missed about a week of using the machine due to having the flu, but since then has done fine.  +exposure to cats and dogs   ROS: No fever, myalgias/arthralgias, unexplained weight loss or weight gain No new focal weakness or sensory deficits No otalgia, hearing loss, visual changes No neck pain or adenopathy No abdominal pain, N/V/D, diarrhea, change in bowel pattern + Cough + wheezing Other ROS negative  BP 100/60 (BP Location: Left Arm, Cuff Size: Normal)   Pulse 72   Resp 16   Ht 5' 5.5" (1.664 m)   Wt 196 lb (88.9 kg)   SpO2 96%   BMI 32.12 kg/m    EXAM:  Gen: Moderately obese, No overt respiratory distress, HEENT: NCAT, sclera white, mild rhinitis with mucus crusting, moderate pharyngeal erythema Neck: Supple without LAN, thyromegaly, JVD Lungs: breath sounds full with normal percussion note and no adventitious sounds Cardiovascular: RRR, no murmurs noted Abdomen: Soft, nontender, normal BS Ext: without clubbing, cyanosis, edema Neuro: CNs grossly intact, motor and sensory intact Skin: Limited exam, no lesions noted  IMPRESSION:   57 year old obese pleasant white female with moderate persistent asthma in the setting of obesity and deconditioned state associated with allergic rhinitis also with signs and symptoms of excessive daytime sleepiness and loud snoring which  is suggestive of obstructive sleep apnea  #1 shortness of breath and wheezing related to moderate persistent asthma with obesity and  deconditioned state  #2 moderate persistent asthma Continue Symbicort  160 twice daily Albuterol 2-4 puffs every 4 hours as needed Avoid allergens We will try to avoid further systemic steroids, therefore will start nebulized Pulmicort twice daily.  She has a trip planned in the next week if she is not feeling better she is going to call us back and we can give her a course of prednisone once again.  #3 allergic rhinitis Patient on Zyrtec   #4 OSA Doing better on CPAP, status post mask fitting clinic.  She did miss approximately 1 week while she was sick with the flu but has been doing well since then. Her download shows that she is hitting the ceiling of her pressure range of 10, will increase pressure range to 5-15.   #5 Obesity -recommend significant weight loss -recommend changing diet  #6 Deconditioned state -Recommend increased daily activity and exercise  #7 GERD Continue PPI   Deep Ashby Dawes, MD.   Board Certified in Internal Medicine, Pulmonary Medicine, [place], and Sleep Medicine.  [facility] Office Number: [phone] Pager: [phone]  Patricia Pesa, M.D.  Merton Border, M.D

## 2017-04-17 NOTE — Patient Instructions (Addendum)
Will increase pressure range to 5-15 cm H2O.  Will start nebulized steroids. If you need prednisone for your upcoming trip you can call us.   Follow up in 3 months with Dr. Mortimer Fries.

## 2017-04-24 ENCOUNTER — Telehealth: Payer: Self-pay | Admitting: Internal Medicine

## 2017-04-24 NOTE — Telephone Encounter (Signed)
Pt would like to know how long she is to be on the nebulizer, please address if she should continue taking her 2 inhalers, and if she needs to be on the nebulizer, she needs a note stating she can take her nebulizer and cpap on an airplane. She is going out of town on 4/24.

## 2017-04-24 NOTE — Telephone Encounter (Signed)
Take pulmicort neb twice daily until next visit or feeling better.  You can take medical equipment in checked baggage or carry-on, you do not need a letter as it is allowed to take in your bag. You can use albuterol MDI on the plane if needed.

## 2017-04-24 NOTE — Telephone Encounter (Signed)
Pt informed of response. Nothing further needed. 

## 2017-04-24 NOTE — Telephone Encounter (Signed)
Below is your note from last OV. Please advise on message below from patient.  IMPRESSION:   58 year old obese pleasant white female with moderate persistent asthma in the setting of obesity and deconditioned state associated with allergic rhinitis also with signs and symptoms of excessive daytime sleepiness and loud snoring which  is suggestive of obstructive sleep apnea  #1 shortness of breath and wheezing related to moderate persistent asthma with obesity and deconditioned state  #2 moderate persistent asthma Continue Symbicort  160 twice daily Albuterol 2-4 puffs every 4 hours as needed Avoid allergens We will try to avoid further systemic steroids, therefore will start nebulized Pulmicort twice daily.  She has a trip planned in the next week if she is not feeling better she is going to call us back and we can give her a course of prednisone once again.  #3 allergic rhinitis Patient on Zyrtec   #4 OSA Doing better on CPAP, status post mask fitting clinic.  She did miss approximately 1 week while she was sick with the flu but has been doing well since then. Her download shows that she is hitting the ceiling of her pressure range of 10, will increase pressure range to 5-15.   #5 Obesity -recommend significant weight loss -recommend changing diet  #6 Deconditioned state -Recommend increased daily activity and exercise  #7 GERD Continue PPI

## 2017-05-13 ENCOUNTER — Encounter: Payer: Self-pay | Admitting: Internal Medicine

## 2017-05-13 ENCOUNTER — Ambulatory Visit: Payer: BC Managed Care – PPO | Admitting: Internal Medicine

## 2017-05-13 VITALS — BP 120/86 | HR 69 | Ht 65.5 in | Wt 199.0 lb

## 2017-05-13 DIAGNOSIS — J4520 Mild intermittent asthma, uncomplicated: Secondary | ICD-10-CM

## 2017-05-13 NOTE — Progress Notes (Signed)
PULMONARY CONSULT NOTE  Requesting MD/Service: Benita Stabile Date of initial consultation: 05/15/16 Reason for consultation: Chronic cough  PT PROFILE: 57 y.o. female never smoker referred for chronic cough. Initial impression: all 3 components of cough triad with probable component of cyclical cough. PPI dose increased, ICS/LABA changed, antihistamine and cough suppressant initiated.   PREVIOUS HPI Ms. Rena is referred for evaluation of chronic cough of many years duration. She dates it back to 2009 or 2010. Her cough has been intermittent but has to be worse in the spring autumn and winter. It has been particularly bad in the last couple of years. She reports post tussive emesis on occasion. It tends to be worse during the night and postprandial. She saw pulmonologist at [facility] 2009 and was told that she has asthma. At that time she was started on Singulair with modest improvement. In 2015 she was started on Symbicort which "helped a lot". However, because of the payer issues, she was changed from Symbicort to [facility] which does not seem to control her cough as well. Other triggers include meals, bending over, excessive heat and exercise. She has a diagnosis of gastroesophageal reflux. She presently takes proton Milligrams twice a day and ranitidine at bedtime. She describes moderate exertional dyspnea, limited to a single flight of stairs or a couple of 100 yards on flat ground. She has a prior history of myocardial infarction. She suffered a cardiac arrest in 2013. She does describe nasal congestion and chronic throat irritation.Denies CP, fever, purulent sputum, hemoptysis, LE edema and calf tenderness.    CC follow up ASTHMA  HPI On SYmbicort daily ASTHMA under control  Had used pulmicort nebs daily several months ago  and helped  +DX of OSA AHI was 21 Current report AHI 2.4 93% compliance rate  She has gained 40 pounds over the last 5 years Her reflux is  under control with Protonix Patient has allergic rhinitis as well-flonase did not help On zyrtec daily and singulair-both help +exposure to cats and dogs  ROS: No fever, myalgias/arthralgias, unexplained weight loss or weight gain No new focal weakness or sensory deficits No otalgia, hearing loss, visual changes No neck pain or adenopathy No abdominal pain, N/V/D, diarrhea, change in bowel pattern + Cough Other ROS negative   BP 120/86 (BP Location: Left Arm, Cuff Size: Normal)   Pulse 69   Ht 5' 5.5" (1.664 m)   Wt 199 lb (90.3 kg)   SpO2 97%   BMI 32.61 kg/m   EXAM:  Gen: Moderately obese, No overt respiratory distress, coughing frequently and vigorously HEENT: NCAT, sclera white, mild rhinitis with mucus crusting, moderate pharyngeal erythema Neck: Supple without LAN, thyromegaly, JVD Lungs: breath sounds full with normal percussion note and no adventitious sounds Cardiovascular: RRR, no murmurs noted Abdomen: Soft, nontender, normal BS Ext: without clubbing, cyanosis, edema Neuro: CNs grossly intact, motor and sensory intact Skin: Limited exam, no lesions noted    IMPRESSION:   57 year old obese pleasant white female with /mildmoderate persistent asthma now under control in the setting of obesity and deconditioned state associated with allergic rhinitis also with signs and symptoms of excessive daytime sleepiness and loud snoring has DX of OSA  #1 shortness of breath and wheezing related to moderate persistent asthma with obesity and deconditioned state  #2 mild-moderate persistent asthma-under control at this time Symbicort increased to 160 twice daily Albuterol 2-4 puffs every 4 hours as needed Avoid allergens  #3 allergic rhinitis Patient on Zyrtec   #  4 excessive daytime sleepiness and loud snoring has DX of  OSA Follow up with compliance report at next visit Patient using and benefitting from usage continue autocpap as prescribed 5-10 cm h20   #5  Obesity -recommend significant weight loss -recommend changing diet  #6 Deconditioned state -Recommend increased daily activity and exercise  #7 GERD Continue PPI   Patient satisfied with Plan of action and management. All questions answered Follow-up in 6 months   Jes Costales Patricia Pesa, M.D.  [facility]  Medical Director [place] Director [facility] Cardio-Pulmonary Department

## 2017-05-13 NOTE — Patient Instructions (Signed)
Continue inhalers as prescribed Pulmicort NEBS as needed  Continue AUTO CPAP AS PRESCIBED

## 2017-07-28 ENCOUNTER — Other Ambulatory Visit: Payer: Self-pay | Admitting: Internal Medicine

## 2017-11-13 ENCOUNTER — Other Ambulatory Visit: Payer: Self-pay | Admitting: Obstetrics and Gynecology

## 2017-11-13 DIAGNOSIS — L309 Dermatitis, unspecified: Secondary | ICD-10-CM

## 2017-12-11 ENCOUNTER — Ambulatory Visit: Payer: BC Managed Care – PPO | Admitting: Internal Medicine

## 2017-12-11 ENCOUNTER — Encounter: Payer: Self-pay | Admitting: Internal Medicine

## 2017-12-11 VITALS — BP 122/80 | HR 82 | Ht 65.5 in | Wt 204.4 lb

## 2017-12-11 DIAGNOSIS — J45901 Unspecified asthma with (acute) exacerbation: Secondary | ICD-10-CM | POA: Diagnosis not present

## 2017-12-11 DIAGNOSIS — R05 Cough: Secondary | ICD-10-CM | POA: Diagnosis not present

## 2017-12-11 DIAGNOSIS — K219 Gastro-esophageal reflux disease without esophagitis: Secondary | ICD-10-CM

## 2017-12-11 DIAGNOSIS — R059 Cough, unspecified: Secondary | ICD-10-CM

## 2017-12-11 MED ORDER — BENZONATATE 100 MG PO CAPS: 200.0000 mg | ORAL_CAPSULE | Freq: Three times a day (TID) | ORAL | 1 refills | Status: DC | PRN

## 2017-12-11 MED ORDER — PREDNISONE 20 MG PO TABS: 20.0000 mg | ORAL_TABLET | Freq: Every day | ORAL | 1 refills | Status: DC

## 2017-12-11 NOTE — Patient Instructions (Signed)
Prednisone 20 mg daily for 4 weeks Continue inhalers as prescribed  Continue CPAP therapy as prescribed  Tussionex as needed

## 2017-12-11 NOTE — Progress Notes (Signed)
PULMONARY CONSULT NOTE  Requesting MD/Service: Benita Stabile Date of initial consultation: 05/15/16 Reason for consultation: Chronic cough  PT PROFILE: 57 y.o. female never smoker referred for chronic cough. Initial impression: all 3 components of cough triad with probable component of cyclical cough. PPI dose increased, ICS/LABA changed, antihistamine and cough suppressant initiated.   PREVIOUS HPI [name] is referred for evaluation of chronic cough of many years duration. She dates it back to 2009 or 2010. Her cough has been intermittent but has to be worse in the spring autumn and winter. 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  She would like long term prednisone at this time  Patient has been using CPAP intermittently due to severe cough  She has been using tussionex as needed         Review of Systems:  Gen:  Denies  fever, sweats, chills weigh loss  HEENT: Denies blurred vision, double vision, ear pain, eye pain, hearing loss, nose bleeds, sore throat Cardiac:  No dizziness, chest pain or heaviness, chest tightness,edema, No JVD Resp:   +cough, -sputum production, -shortness of breath,-wheezing, -hemoptysis,  Gi: Denies swallowing difficulty, stomach pain, nausea or vomiting, diarrhea, constipation, bowel incontinence Gu:  Denies bladder incontinence, burning urine Ext:   Denies  Joint pain, stiffness or swelling Skin: Denies  skin rash, easy bruising or bleeding or hives Endoc:  Denies polyuria, polydipsia , polyphagia or weight change Psych:   Denies depression, insomnia or hallucinations  Other:  All other systems negative   BP 122/80 (BP Location: Left Arm, Cuff Size: Normal)   Pulse 82   Ht 5' 5.5" (1.664 m)   Wt 204 lb 6.4 oz (92.7 kg)   SpO2 97%   BMI 33.50 kg/m   Physical Examination:   GENERAL:NAD, no fevers, chills, no weakness no fatigue HEAD: Normocephalic, atraumatic.  EYES: Pupils equal, round, reactive to light. Extraocular muscles intact. No scleral icterus.  MOUTH: Moist mucosal membrane. Dentition intact. No abscess noted.  EAR, NOSE, THROAT: Clear without exudates. No external lesions.  NECK: Supple. No thyromegaly. No nodules. No JVD.  PULMONARY: CTA B/L no wheezing, rhonchi, crackles CARDIOVASCULAR: S1 and S2. Regular rate and rhythm. No murmurs, rubs, or gallops. No edema. Pedal pulses 2+ bilaterally.  GASTROINTESTINAL: Soft, nontender, nondistended. No masses. Positive bowel sounds. No hepatosplenomegaly.  MUSCULOSKELETAL: No swelling, clubbing, or edema. Range of motion full in all extremities.  NEUROLOGIC: Cranial nerves II through XII are intact. No gross focal neurological deficits. Sensation intact. Reflexes intact.  SKIN: No ulceration, lesions, rashes, or cyanosis. Skin warm and dry. Turgor intact.  PSYCHIATRIC: Mood, affect within normal limits. The patient is awake, alert and oriented x 3. Insight, judgment intact.  ALL OTHER ROS ARE NEGATIVE        IMPRESSION:   57 year old obese white female seen today for follow-up for diagnosis of mild to  moderate persistent asthma in the setting of obesity and deconditioned state associated with allergic rhinitis and with a diagnosis of sleep apnea   Shortness of breath and intermittent wheezing related to moderate persistent asthma along with obesity and deconditioned  state   Cough-tesselon perles as needed tussionex as needed  Prednisone therapy for 1 month  Mild to moderate persistent asthma with severe persistent cough Symbicort 160 daily Albuterol 2 to 4 puffs every 4 hours as needed Patient advised to avoid allergens Prednisone 20 mg daily for 1 month   Allergic rhinitis Continue antihistamine therapy with Zyrtec as prescribed   OSA with morbid obesity Patient using and benefiting from auto CPAP therapy 5 to 10 cm of water pressure She will need to increase compliance    Obesity -recommend significant weight loss -recommend changing diet  Deconditioned state -Recommend increased daily activity and exercise   GERD Continue PPI     Patient  satisfied with Plan of action and management. All questions answered Follow up in 3 months  Faigy Stretch Patricia Pesa, M.D.  [facility]  Medical Director [place] Director [facility] Cardio-Pulmonary Department

## 2017-12-13 ENCOUNTER — Other Ambulatory Visit: Payer: Self-pay | Admitting: Internal Medicine

## 2018-02-22 ENCOUNTER — Other Ambulatory Visit: Payer: Self-pay | Admitting: Internal Medicine

## 2018-03-30 ENCOUNTER — Other Ambulatory Visit: Payer: Self-pay | Admitting: Internal Medicine

## 2018-03-30 ENCOUNTER — Other Ambulatory Visit: Payer: Self-pay | Admitting: Obstetrics and Gynecology

## 2018-03-30 DIAGNOSIS — R05 Cough: Secondary | ICD-10-CM

## 2018-03-30 DIAGNOSIS — R059 Cough, unspecified: Secondary | ICD-10-CM

## 2018-03-30 DIAGNOSIS — L309 Dermatitis, unspecified: Secondary | ICD-10-CM

## 2018-04-28 ENCOUNTER — Other Ambulatory Visit: Payer: Self-pay | Admitting: Internal Medicine

## 2018-05-30 ENCOUNTER — Telehealth: Payer: Self-pay | Admitting: Internal Medicine

## 2018-05-30 ENCOUNTER — Other Ambulatory Visit: Payer: Self-pay | Admitting: Internal Medicine

## 2018-05-30 DIAGNOSIS — R05 Cough: Secondary | ICD-10-CM

## 2018-05-30 DIAGNOSIS — J45901 Unspecified asthma with (acute) exacerbation: Secondary | ICD-10-CM

## 2018-05-30 DIAGNOSIS — R059 Cough, unspecified: Secondary | ICD-10-CM

## 2018-05-30 NOTE — Telephone Encounter (Signed)
Called and spoke to pt.  Pt stated that her cough has worsen over the past two weeks. Cough is non prod.  Cough has been present for years per pt.  Denied fever, chills, sweats, wheezing, sob, recent travel or sick contacts.  Pt is requesting refill on prednisone.    DK please advise. Thanks

## 2018-05-30 NOTE — Telephone Encounter (Signed)
lmtcb x1 for pt. Pt last seen 12/11/17 and was instructed to continue prednisone 55m for 4 weeks. Pt was to f/u in 324mo  Pt does not currently have a pending appt.

## 2018-05-30 NOTE — Telephone Encounter (Signed)
Pt is calling back [phone]

## 2018-06-05 NOTE — Telephone Encounter (Signed)
Need follow up OV

## 2018-06-05 NOTE — Telephone Encounter (Signed)
Left message for pt

## 2018-06-06 ENCOUNTER — Other Ambulatory Visit: Payer: Self-pay

## 2018-06-06 ENCOUNTER — Encounter: Payer: Self-pay | Admitting: Internal Medicine

## 2018-06-06 ENCOUNTER — Telehealth: Payer: Self-pay | Admitting: Internal Medicine

## 2018-06-06 ENCOUNTER — Other Ambulatory Visit
Admission: RE | Admit: 2018-06-06 | Discharge: 2018-06-06 | Disposition: A | Discharge status: Home / Self Care | Payer: BC Managed Care – PPO | Source: Ambulatory Visit | Attending: Internal Medicine | Admitting: Internal Medicine

## 2018-06-06 ENCOUNTER — Ambulatory Visit (INDEPENDENT_AMBULATORY_CARE_PROVIDER_SITE_OTHER): Payer: BC Managed Care – PPO | Admitting: Internal Medicine

## 2018-06-06 ENCOUNTER — Ambulatory Visit
Admission: RE | Admit: 2018-06-06 | Discharge: 2018-06-06 | Disposition: A | Discharge status: Home / Self Care | Payer: BC Managed Care – PPO | Source: Ambulatory Visit | Attending: Internal Medicine | Admitting: Internal Medicine

## 2018-06-06 VITALS — BP 126/80 | HR 91 | Ht 65.5 in | Wt 214.0 lb

## 2018-06-06 DIAGNOSIS — J4550 Severe persistent asthma, uncomplicated: Secondary | ICD-10-CM

## 2018-06-06 LAB — CBC WITH DIFFERENTIAL/PLATELET
Abs Immature Granulocytes: 0.04 10*3/uL (ref 0.00–0.07)
Basophils Absolute: 0.1 10*3/uL (ref 0.0–0.1)
Basophils Relative: 1 %
Eosinophils Absolute: 0.1 10*3/uL (ref 0.0–0.5)
Eosinophils Relative: 1 %
HCT: 42.1 % (ref 36.0–46.0)
Hemoglobin: 13.5 g/dL (ref 12.0–15.0)
Immature Granulocytes: 0 %
Lymphocytes Relative: 22 %
Lymphs Abs: 1.9 10*3/uL (ref 0.7–4.0)
MCH: 27.7 pg (ref 26.0–34.0)
MCHC: 32.1 g/dL (ref 30.0–36.0)
MCV: 86.4 fL (ref 80.0–100.0)
Monocytes Absolute: 0.9 10*3/uL (ref 0.1–1.0)
Monocytes Relative: 10 %
Neutro Abs: 5.9 10*3/uL (ref 1.7–7.7)
Neutrophils Relative %: 66 %
Platelets: 272 10*3/uL (ref 150–400)
RBC: 4.87 MIL/uL (ref 3.87–5.11)
RDW: 15.4 % (ref 11.5–15.5)
WBC: 9 10*3/uL (ref 4.0–10.5)
nRBC: 0 % (ref 0.0–0.2)

## 2018-06-06 MED ORDER — TIOTROPIUM BROMIDE MONOHYDRATE 1.25 MCG/ACT IN AERS: 2.0000 | INHALATION_SPRAY | Freq: Every day | RESPIRATORY_TRACT | 5 refills | Status: DC

## 2018-06-06 MED ORDER — ALBUTEROL SULFATE (2.5 MG/3ML) 0.083% IN NEBU: 2.5000 mg | INHALATION_SOLUTION | Freq: Four times a day (QID) | RESPIRATORY_TRACT | 12 refills | Status: DC | PRN

## 2018-06-06 MED ORDER — PREDNISONE 20 MG PO TABS: 20.0000 mg | ORAL_TABLET | Freq: Every day | ORAL | 0 refills | Status: DC

## 2018-06-06 NOTE — Patient Instructions (Addendum)
Obtain CBC with differential to check for eosinophil levels  Check IgE levels  Obtain chest x-ray two-view  PREDNISONE 20 mg for 20 days Spiriva Respimat therapy 1.25   Mepolizumab injection What is this medicine? MEPOLIZUMAB (me poe LIZ ue mab) is used in combination with other treatments to help treat severe asthma. It is also used for the treatment of a specific condition that causes inflammation of smaller blood vessels (vasculitis). This medicine may be used for other purposes; ask your health care provider or pharmacist if you have questions. COMMON BRAND NAME(S): Nucala What should I tell my health care provider before I take this medicine? They need to know if you have any of these conditions: -parasitic (helminth) infection -an unusual or allergic reaction to mepolizumab, hamster proteins, other medicines, foods, dyes, or preservatives -pregnant or trying to get pregnant -breast-feeding How should I use this medicine? This medicine is for injection under the skin. It is given by a health care professional in a hospital or clinic setting or at home. If you get this medicine at home, you will be taught how to prepare and give this medicine. Use exactly as directed. Take your medicine at regular intervals. Do not take your medicine more often than directed. It is important that you put your used injectors, needles and syringes in a special sharps container. Do not put them in a trash can. If you do not have a sharps container, call your pharmacist or healthcare provider to get one. Talk to your pediatrician regarding the use of this medicine in children. While this drug may be prescribed for children as young as 6 years for selected conditions, precautions do apply. Overdosage: If you think you have taken too much of this medicine contact a poison control center or emergency room at once. NOTE: This medicine is only for you. Do not share this medicine with others. What if I miss a  dose? It is important not to miss your dose. Call your doctor of health care professional if you are unable to keep an appointment. If you give yourself the medicine and you miss a dose, take it as soon as you can. Then, take your next dose at your regular scheduled time. If it is almost time for your next dose, take only that dose. Do not take double or extra doses. If you are not sure what to do about the missed dose, call your health care professional. What may interact with this medicine? Interactions are not expected. This list may not describe all possible interactions. Give your health care provider a list of all the medicines, herbs, non-prescription drugs, or dietary supplements you use. Also tell them if you smoke, drink alcohol, or use illegal drugs. Some items may interact with your medicine. What should I watch for while using this medicine? Tell your doctor or healthcare professional if your symptoms do not start to get better or if they get worse. Talk with your doctor if you have not had chickenpox or the vaccine for chickenpox. Do not stop taking your other asthma medicines unless instructed to do so by your doctor or health care professional. Your condition will be monitored carefully while you are receiving this medicine. What side effects may I notice from receiving this medicine? Side effects that you should report to your doctor or health care professional as soon as possible: -allergic reactions like skin rash, itching or hives, swelling of the face, lips, or tongue -breathing problems -painful rash or blisters -signs and  symptoms of low blood pressure like dizziness; feeling faint or lightheaded, falls -signs and symptoms of infection like fever or chills; cough; sore throat; pain or trouble passing urine Side effects that usually do not require medical attention (report to your doctor or health care professional if they continue or are bothersome): -back  pain -headache -pain, redness, or irritation at the site where injected -weak or tired This list may not describe all possible side effects. Call your doctor for medical advice about side effects. You may report side effects to FDA at 1-800-FDA-1088. Where should I keep my medicine? Keep out of the reach of children. If you use the vials at home, you will be instructed on how to store this medicine. Throw away any unused medicine after the expiration date on the label. Store unopened autoinjectors and prefilled syringes in the refrigerator between 2 and 8 degrees C (36 and 46 degrees F). Do not freeze. Keep this medicine in the original container. Protect from light. Do not shake. If necessary, unopened autoinjectors and syringes can be stored out of the refrigerator at up to 30 degrees C (86 degrees F) for up to 7 days. Discard if left out of the refrigerator for more than 7 days. This medicine must be administered within 8 hours after removal from the packaging. Discard if not administered within 8 hours. Throw away any unopened, unused medicine after the expiration date on the label. NOTE: This sheet is a summary. It may not cover all possible information. If you have questions about this medicine, talk to your doctor, pharmacist, or health care provider.  2019 Elsevier/Gold Standard (2017-09-20 11:10:36)

## 2018-06-06 NOTE — Telephone Encounter (Signed)
Pt scheduled for OV 06/06/2018.

## 2018-06-06 NOTE — Telephone Encounter (Signed)
Nucala paperwork has been started.  Will await IGE levels prior to faxing to [place]. Application can be located in Probation officer.

## 2018-06-06 NOTE — Progress Notes (Signed)
PULMONARY CONSULT NOTE  Requesting MD/Service: Benita Stabile Date of initial consultation: 05/15/16 Reason for consultation: Chronic cough  PT PROFILE: 58 y.o. female never smoker referred for chronic cough. Initial impression: all 3 components of cough triad with probable component of cyclical cough. PPI dose increased, ICS/LABA changed, antihistamine and cough suppressant initiated.   PREVIOUS HPI [name] is referred for evaluation of chronic cough of many years duration. She dates it back to 2009 or 2010. Her cough has been intermittent but has to be worse in the spring autumn and winter. DX with ASTHMA DX of GERD DX of OSA S/p cardiac arrest 2013    CC  Follow-up asthma  Follow-up sleep apnea    HPI  Patient with severe persistent asthma Prednisone therapy did help significantly with her cough and wheezing Patient still has persistent wheezing and shortness of breath and cough  No signs of infection at this time She has had several rounds of prednisone therapy in the last 6 months  She is is exposed to cats and 1 dog at home She takes intranasal steroids as well as Zyrtec and Singulair She still has symptoms despite taking all of these medicines  I have explained to her the risks and benefits of prednisone therapy I have also explained to her that she may need mepolizumab injection as a biological therapy for her severe persistent asthma  I have explained to patient that we will need several lab tests and will provide information on the medication        Review of Systems:  Gen:  Denies  fever, sweats, chills weigh loss  HEENT: Denies blurred vision, double vision, ear pain, eye pain, hearing loss, nose bleeds, sore throat Cardiac:  No dizziness, chest pain or heaviness, chest tightness,edema, No JVD Resp:   No cough, -sputum production, + shortness of breath, + wheezing, -hemoptysis,  Gi: Denies swallowing difficulty, stomach pain, nausea or vomiting,  diarrhea, constipation, bowel incontinence Gu:  Denies bladder incontinence, burning urine Ext:   Denies Joint pain, stiffness or swelling Skin: Denies  skin rash, easy bruising or bleeding or hives Endoc:  Denies polyuria, polydipsia , polyphagia or weight change Psych:   Denies depression, insomnia or hallucinations  Other:  All other systems negative     BP 126/80 (BP Location: Left Arm, Cuff Size: Normal)   Pulse 91   Ht 5' 5.5" (1.664 m)   Wt 214 lb (97.1 kg)   SpO2 95%   BMI 35.07 kg/m   Physical Examination:   GENERAL:NAD, no fevers, chills, no weakness no fatigue HEAD: Normocephalic, atraumatic.  EYES: PERLA, EOMI No scleral icterus.  MOUTH: Moist mucosal membrane.  EAR, NOSE, THROAT: Clear without exudates. No external lesions.  NECK: Supple. No thyromegaly.  No JVD.  PULMONARY: CTA B/L + wheezing, CARDIOVASCULAR: S1 and S2. Regular rate and rhythm. No murmurs GASTROINTESTINAL: Soft, nontender, nondistended. Positive bowel sounds.  MUSCULOSKELETAL: No swelling, clubbing, or edema.  NEUROLOGIC: No gross focal neurological deficits. 5/5 strength all extremities SKIN: No ulceration, lesions, rashes, or cyanosis.  PSYCHIATRIC: Insight, judgment intact. -depression -anxiety ALL OTHER ROS ARE NEGATIVE        IMPRESSION:   58 year old obese white female seen today for follow-up severe persistent asthma in the setting of morbid obesity deconditioned state and a diagnosis of allergic rhinitis and sleep apnea  Shortness of breath and severe persistent wheezing and severe persistent cough related to her severe persistent asthma associated with obesity and deconditioned state   Severe persistent  asthma with severe persistent cough Continue Symbicort as prescribed Albuterol 2 to 4 puffs every 4 hours as needed We will start Spiriva Respimat therapy Prednisone 20 mg daily for 20 days Patient advised to avoid allergens At this time patient is a candidate for biological  and immunological therapy with mepolizumab injection Will obtain IgE and CBC with eosinophil levels Obtain chest x-ray two-view    Allergic rhinitis continue antihistamine therapy with Zyrtec as prescribed  OSA with morbid obesity Patient using and benefiting from auto CPAP therapy 5 to 10 cm water pressure Will need compliance report and next office visit  Obesity -recommend significant weight loss -recommend changing diet  Deconditioned state -Recommend increased daily activity and exercise   GERD Continue PPI  COVID-19 EDUCATION: The signs and symptoms of COVID-19 were discussed with the patient and how to seek care for testing.  The importance of social distancing was discussed today. Hand Washing Techniques and avoid touching face was advised.  MEDICATION ADJUSTMENTS/LABS AND TESTS ORDERED: Prednisone 20 mg for 20 days IgE levels CBC with differential 2 view chest x-ray   CURRENT MEDICATIONS REVIEWED AT LENGTH WITH PATIENT TODAY   Patient satisfied with Plan of action and management. All questions answered Follow up in 3 months   Kymia Simi Patricia Pesa, M.D.  [facility]  Medical Director [place] Director [facility] Cardio-Pulmonary Department

## 2018-06-06 NOTE — Addendum Note (Signed)
Addended by: Maryanna Shape A on: 06/06/2018 04:06 PM   Modules accepted: Orders

## 2018-06-09 NOTE — Telephone Encounter (Signed)
CBC w/diff has been resulted. It appears that Eosinophils Absolute count is 0.1.  DK, did you wish to still attempt Nucala?

## 2018-06-09 NOTE — Progress Notes (Signed)
Atelectasis in the lingula.

## 2018-06-10 NOTE — Telephone Encounter (Signed)
Pt is aware of below message and voiced her understanding. Nothing further is needed. 

## 2018-06-10 NOTE — Telephone Encounter (Signed)
No, thank you

## 2018-07-07 NOTE — Progress Notes (Signed)
PCP: Albina Billet, MD   Chief Complaint  Patient presents with  . Gynecologic Exam    HPI:      [name] is a 58 y.o. [id number] who LMP was No LMP recorded. Patient has had an ablation., presents today for her annual examination.  Her menses are absent due to ablation and she is postmenopausal. Hematuria vs vag bleeding from last yr resolved. She does have tolerable vasomotor sx.   Sex activity: not sexually active. She does not have vaginal dryness. Does get vaginal irritation/fissures due to moisture. Treats with lotrisone crm ext with relief.   Last Pap: December 28, 2015  Results were: no abnormalities /neg HPV DNA.  Hx of STDs: HPV on pap  Last mammogram: 08/29/17 at [facility].  Results were: normal--routine follow-up in 12 months There is no FH of breast cancer. There is no FH of ovarian cancer. The patient does do self-breast exams.  Colonoscopy: colonoscopy 5 years ago with abnormalities.  Repeat due this yr. Pt had done at [facility].   Tobacco use: The patient denies current or previous tobacco use. Alcohol use: none Exercise: not active  She does not get adequate calcium and Vitamin D in her diet.  Labs with PCP.  She cont to get fungal infections under panus/bilat ing area. She tries to keep area dry but sx are intermittent. She uses gold bond powder to help with moisture control and treats prn with lotrisone crm.   Past Medical History:  Diagnosis Date  . Abnormal Pap smear of cervix 1988  . Acid reflux 01/17/2013  . Allergic rhinitis   . Anemia   . Anxiety   . Asthma   . Celiac disease   . Cervical dysplasia 1988  . CHF (congestive heart failure) ([place])   . Coronary artery disease   . Heart trouble   . History of cardiac arrest   . History of mammogram 03/25/2013; 12/07/14   BIRADS 1; NEG  . History of Papanicolaou smear of cervix 03/20/12; 12/28/15   -/-; -/-  . Hypercholesteremia   . Hypertension   . Menopausal symptoms   . MI (myocardial  infarction) ([place])   . Mitral valve prolapse   . Sleep apnea   . Vulvovaginitis    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    Past Surgical History:  Procedure Laterality Date  . ABLATION  2013   [facility]  . BACK SURGERY    . BACK SURGERY  2016; 2017   L4, L5  . CARDIAC SURGERY  03/19/12; 10/2014   HEART CATH AND STENT  . [place]; 1989  . COLD KNIFE CONE BIOPSY  1988  . COLONOSCOPY  08/2010   16 POLYPS (ALL BENIGN) DX C CELIAC DISEASE  . COMBINED HYSTEROSCOPY DIAGNOSTIC / D&C  2013   [facility]  . DILATION AND CURETTAGE OF UTERUS  2001  . ESOPHAGOGASTRODUODENOSCOPY  08/2010   DX C CELIAC DISEASE  . HEART STENTS  05/27/2011  . HIP SURGERY  2016   RIGHT IT BAND AND BURSA REMOVAL  . OOPHORECTOMY Right 1982    Family History  Problem Relation Age of Onset  . CAD Father   . Transient ischemic attack Father   . Diabetes Father   . Cerebrovascular Accident Father   . Heart disease Father        M-MVP; F BETA;BLOCKERS  . Hypothyroidism Father   . Hyperthyroidism Mother   . Cancer  Maternal Uncle        KIDNEY  . Uterine cancer Maternal Aunt 69    Social History   Socioeconomic History  . Marital status: Divorced    Spouse name: Not on file  . Number of children: 2  . Years of education: 83  . Highest education level: Not on file  Occupational History  . Occupation: TEACHER  Social Needs  . Financial resource strain: Not on file  . Food insecurity    Worry: Not on file    Inability: Not on file  . Transportation needs    Medical: Not on file    Non-medical: Not on file  Tobacco Use  . Smoking status: Never Smoker  . Smokeless tobacco: Never Used  Substance and Sexual Activity  . Alcohol use: No    Frequency: Never  . Drug use: No  . Sexual activity: Not Currently    Birth control/protection: Surgical    Comment: ABLATION  Lifestyle  . Physical activity    Days per week: Not on file    Minutes per session: Not on file  . Stress:  Not on file  Relationships  . Social Herbalist on phone: Not on file    Gets together: Not on file    Attends religious service: Not on file    Active member of club or organization: Not on file    Attends meetings of clubs or organizations: Not on file    Relationship status: Not on file  . Intimate partner violence    Fear of current or ex partner: Not on file    Emotionally abused: Not on file    Physically abused: Not on file    Forced sexual activity: Not on file  Other Topics Concern  . Not on file  Social History Narrative  . Not on file    Current Meds  Medication Sig  . albuterol (PROVENTIL) (2.5 MG/3ML) 0.083% nebulizer solution Take 3 mLs (2.5 mg total) by nebulization every 6 (six) hours as needed for wheezing or shortness of breath.  Marland Kitchen amLODipine (NORVASC) 5 MG tablet Take 1 tablet (5 mg total) by mouth daily.  . budesonide (PULMICORT) 0.5 MG/2ML nebulizer solution Take 2 mLs (0.5 mg total) by nebulization 2 (two) times daily.  . cetirizine (ZYRTEC) 10 MG tablet TAKE 1 TABLET BY MOUTH EVERY DAY  . chlorpheniramine-HYDROcodone (TUSSIONEX) 10-8 MG/5ML SUER TAKE 5 MILLILITERS BY ORAL ROUTE EVERY 12 HOURS  . clotrimazole-betamethasone (LOTRISONE) cream AAA BID prn sx for up to 2 wks  . [facility] ASPIRIN ADULT LOW DOSE 81 MG chewable tablet Chew 81 mg by mouth daily.  . isosorbide mononitrate (IMDUR) 30 MG 24 hr tablet Take 15 mg by mouth daily.   Marland Kitchen LORazepam (ATIVAN) 1 MG tablet Take 1 mg by mouth 2 (two) times daily.   . montelukast (SINGULAIR) 10 MG tablet Take 10 mg by mouth at bedtime.  Marland Kitchen NITROSTAT 0.4 MG SL tablet Place 0.4 mg under the tongue every 5 (five) minutes as needed for chest pain.   . pantoprazole (PROTONIX) 20 MG tablet Take 20 mg by mouth 2 (two) times daily.   . predniSONE (DELTASONE) 20 MG tablet Take 1 tablet (20 mg total) by mouth daily with breakfast. 20 days  . PROAIR HFA 108 (90 Base) MCG/ACT inhaler Inhale 2 puffs into the lungs every 6 (six)  hours as needed.  . rosuvastatin (CRESTOR) 10 MG tablet Take 10 mg by mouth at bedtime.   Marland Kitchen  SYMBICORT 160-4.5 MCG/ACT inhaler TAKE 2 PUFFS BY MOUTH TWICE A DAY  . ticagrelor (BRILINTA) 60 MG TABS tablet Take 60 mg by mouth 2 (two) times daily.   . Tiotropium Bromide Monohydrate (SPIRIVA RESPIMAT) 1.25 MCG/ACT AERS Inhale 2 puffs into the lungs daily.  . [DISCONTINUED] clotrimazole-betamethasone (LOTRISONE) cream APPLY TO AFFECTED AREA AND SURROUNDING AREAS TWICE A DAY AS NEEDED FOR UP TO 4 DAYS      ROS:  Review of Systems  Constitutional: Negative for fatigue, fever and unexpected weight change.  Respiratory: Positive for cough and wheezing. Negative for shortness of breath.   Cardiovascular: Negative for chest pain, palpitations and leg swelling.  Gastrointestinal: Negative for blood in stool, constipation, diarrhea, nausea and vomiting.  Endocrine: Negative for cold intolerance, heat intolerance and polyuria.  Genitourinary: Negative for dyspareunia, dysuria, flank pain, frequency, genital sores, hematuria, menstrual problem, pelvic pain, urgency, vaginal bleeding, vaginal discharge and vaginal pain.  Musculoskeletal: Negative for back pain, joint swelling and myalgias.  Skin: Positive for rash.  Neurological: Negative for dizziness, syncope, light-headedness, numbness and headaches.  Hematological: Negative for adenopathy. Bruises/bleeds easily.  Psychiatric/Behavioral: Negative for agitation, confusion, sleep disturbance and suicidal ideas. The patient is not nervous/anxious.      Objective: BP 140/82   Ht 5' 5.5" (1.664 m)   Wt 216 lb 9.6 oz (98.2 kg)   BMI 35.50 kg/m    Physical Exam Constitutional:      Appearance: She is well-developed.  Genitourinary:     Vagina, cervix, uterus, right adnexa and left adnexa normal.     No vulval lesion or tenderness noted.     No vaginal discharge, erythema or tenderness.     No cervical polyp.     Uterus is not enlarged or  tender.     No right or left adnexal mass present.     Right adnexa not tender.     Left adnexa not tender.     Genitourinary Comments: PERINEAL HYPERTROPHY WITH FISSURES; ERYTHEMA BILAT LABIA MAJORA AND PERINEAL/PERIANAL AREA; VERY MOIST  Neck:     Musculoskeletal: Normal range of motion.     Thyroid: No thyromegaly.  Cardiovascular:     Rate and Rhythm: Normal rate and regular rhythm.     Heart sounds: Normal heart sounds. No murmur.  Pulmonary:     Effort: Pulmonary effort is normal.     Breath sounds: Normal breath sounds.  Chest:     Breasts:        Right: No mass, nipple discharge, skin change or tenderness.        Left: No mass, nipple discharge, skin change or tenderness.  Abdominal:     Palpations: Abdomen is soft.     Tenderness: There is no abdominal tenderness. There is no guarding.  Musculoskeletal: Normal range of motion.  Neurological:     General: No focal deficit present.     Mental Status: She is alert and oriented to person, place, and time.     Cranial Nerves: No cranial nerve deficit.  Skin:    General: Skin is warm and dry.     Comments: FUNGAL ERYTHEMA/D/C UNDER PANUS/ BILAT ING AREAS  Psychiatric:        Mood and Affect: Mood normal.        Behavior: Behavior normal.        Thought Content: Thought content normal.        Judgment: Judgment normal.  Vitals signs reviewed.     Assessment/Plan:  Encounter for  annual routine gynecological examination -   Screening for breast cancer - Plan: MM 3D SCREEN BREAST BILATERAL, Pt to sched mamm[facility] at [facility]  Screening for colon cancer - Plan: Refer to Dr. Vira Agar for scr colonoscopy.   Dermatitis - Plan: Rx RF lotrisone crm for panus and perineal area. Try antifungal powder under panus (instead of gold bond). Keep areas dry.    Meds ordered this encounter  Medications  . clotrimazole-betamethasone (LOTRISONE) cream    Sig: AAA BID prn sx for up to 2 wks    Dispense:  45 g    Refill:  0    Order Specific  Question:   Supervising Provider    Answer:   Gae Dry [[id number]]         GYN counsel breast self exam, mammography screening, menopause, adequate intake of calcium and vitamin D, diet and exercise    F/U  Return in about 1 year (around 07/08/2019).  Onaje Warne B. Kenzel Ruesch, PA-C 07/08/2018 9:49 AM

## 2018-07-08 ENCOUNTER — Encounter: Payer: Self-pay | Admitting: Obstetrics and Gynecology

## 2018-07-08 ENCOUNTER — Other Ambulatory Visit: Payer: Self-pay

## 2018-07-08 ENCOUNTER — Ambulatory Visit (INDEPENDENT_AMBULATORY_CARE_PROVIDER_SITE_OTHER): Payer: BC Managed Care – PPO | Admitting: Obstetrics and Gynecology

## 2018-07-08 VITALS — BP 140/82 | Ht 65.5 in | Wt 216.6 lb

## 2018-07-08 DIAGNOSIS — Z01419 Encounter for gynecological examination (general) (routine) without abnormal findings: Secondary | ICD-10-CM

## 2018-07-08 DIAGNOSIS — Z1239 Encounter for other screening for malignant neoplasm of breast: Secondary | ICD-10-CM

## 2018-07-08 DIAGNOSIS — L309 Dermatitis, unspecified: Secondary | ICD-10-CM

## 2018-07-08 DIAGNOSIS — Z1211 Encounter for screening for malignant neoplasm of colon: Secondary | ICD-10-CM

## 2018-07-08 MED ORDER — CLOTRIMAZOLE-BETAMETHASONE 1-0.05 % EX CREA: TOPICAL_CREAM | CUTANEOUS | 0 refills | Status: DC

## 2018-07-08 NOTE — Patient Instructions (Signed)
I value your feedback and entrusting us with your care. If you get a [facility] patient survey, I would appreciate you taking the time to let us know about your experience today. Thank you!  [facility]: [phone]  [facility]: [phone]  

## 2018-07-21 ENCOUNTER — Other Ambulatory Visit: Payer: Self-pay | Admitting: Internal Medicine

## 2018-07-29 ENCOUNTER — Telehealth: Payer: Self-pay | Admitting: Pulmonary Disease

## 2018-07-29 NOTE — Telephone Encounter (Signed)
Called pt x1 to reschedule 09/08/2018 appt. Provider will be out of office.

## 2018-07-30 NOTE — Telephone Encounter (Signed)
Appt. Rescheduled. Nothing further needed

## 2018-08-05 ENCOUNTER — Ambulatory Visit: Payer: BC Managed Care – PPO | Admitting: Internal Medicine

## 2018-08-05 ENCOUNTER — Encounter: Payer: Self-pay | Admitting: Internal Medicine

## 2018-08-05 ENCOUNTER — Other Ambulatory Visit: Payer: Self-pay

## 2018-08-05 VITALS — BP 132/78 | HR 89 | Temp 98.1°F | Ht 65.0 in | Wt 218.6 lb

## 2018-08-05 DIAGNOSIS — R05 Cough: Secondary | ICD-10-CM | POA: Diagnosis not present

## 2018-08-05 DIAGNOSIS — R053 Chronic cough: Secondary | ICD-10-CM

## 2018-08-05 DIAGNOSIS — J309 Allergic rhinitis, unspecified: Secondary | ICD-10-CM | POA: Diagnosis not present

## 2018-08-05 MED ORDER — MOMETASONE FUROATE 50 MCG/ACT NA SUSP: 2.0000 | Freq: Every day | NASAL | 2 refills | Status: DC

## 2018-08-05 NOTE — Patient Instructions (Addendum)
Continue Inhalers as prescribed Continue albuterol as needed Continue PPI for reflux  Start Nasonex nasal spray   Please use CPAP regularily  Needs ENT referral for Allergy assessment

## 2018-08-05 NOTE — Progress Notes (Signed)
PULMONARY CONSULT NOTE  Requesting MD/Service: Benita Stabile Date of initial consultation: 05/15/16 Reason for consultation: Chronic cough  PT PROFILE: 58 y.o. female never smoker referred for chronic cough. Initial impression: all 3 components of cough triad with probable component of cyclical cough. PPI dose increased, ICS/LABA changed, antihistamine and cough suppressant initiated.   PREVIOUS HPI Brenda Craig is referred for evaluation of chronic cough of many years duration. She dates it back to 2009 or 2010. Her cough has been intermittent but has to be worse in the spring autumn and winter. DX with ASTHMA DX of GERD DX of OSA S/p cardiac arrest 2013    [facility]  Follow-up asthma Follow-up sleep apnea   HPI Patient with severe persistent cough Patient states she has intermittent wheezing However on physical exam there is no wheezing She has good air entry in both lung fields  She is on triple therapy inhaler therapy with Symbicort and Spiriva These medications do help a little bit She has increased shortness of breath and dyspnea on exertion  No signs of infection at this time No signs of respiratory failure at this time  She has had several rounds of prednisone therapy in the last 6 months  Patient has a chronic cough for the last 10 years She has been worked up multiple times at [facility] She has had ENT referrals in the past with extensive work-up  At this time no one can tell her what is causing her cough Patient is requesting refill on Tussionex I have explained to patient we no longer prescribe narcotic-based antitussive medicines  Patient takes Zyrtec and Singulair Patient needs to take intranasal steroid  Exposed to cats and dogs at home She states she is not allergic but would like a referral for assessment  Patient had normal eosinophil levels of 100 Patient is not a candidate for immunological or biological therapy  I have explained to her that  her cough could also be psychogenic related to stress  Reviewed chest x-ray findings today with patient I reviewed her chest x-ray 06/06/2018  Normal findings no effusions no masses no opacifications  Review of Systems:  Gen:  Denies  fever, sweats, chills weight loss  HEENT: Denies blurred vision, double vision, ear pain, eye pain, hearing loss, nose bleeds, sore throat Cardiac:  No dizziness, chest pain or heaviness, chest tightness,edema, No JVD Resp:   + cough, -sputum production, +shortness of breath,+wheezing, -hemoptysis,  Gi: Denies swallowing difficulty, stomach pain, nausea or vomiting, diarrhea, constipation, bowel incontinence Gu:  Denies bladder incontinence, burning urine Ext:   Denies Joint pain, stiffness or swelling Skin: Denies  skin rash, easy bruising or bleeding or hives Endoc:  Denies polyuria, polydipsia , polyphagia or weight change Psych:   Denies depression, insomnia or hallucinations  Other:  All other systems negative     BP 132/78 (BP Location: Left Arm, Cuff Size: Normal)   Pulse 89   Temp 98.1 F (36.7 C) (Temporal)   Ht 5' 5"  (1.651 m)   Wt 218 lb 9.6 oz (99.2 kg)   SpO2 94%   BMI 36.38 kg/m    Physical Examination:   GENERAL:NAD, no fevers, chills, no weakness no fatigue HEAD: Normocephalic, atraumatic.  EYES: PERLA, EOMI No scleral icterus.  NECK: Supple. No thyromegaly.  No JVD.  PULMONARY: CTA B/L no wheezing, rhonchi, crackles CARDIOVASCULAR: S1 and S2. Regular rate and rhythm. No murmurs GASTROINTESTINAL: Soft, nontender, nondistended. Positive bowel sounds.  MUSCULOSKELETAL: No swelling, clubbing, or edema.  NEUROLOGIC:  No gross focal neurological deficits. 5/5 strength all extremities SKIN: No ulceration, lesions, rashes, or cyanosis.  PSYCHIATRIC: Insight, judgment intact. -depression -anxiety ALL OTHER ROS ARE NEGATIVE              IMPRESSION:   58 year old obese white female 58 year old obese white female seen today for follow-up severe persistent  cough in the setting of morbid obesity with deconditioned state with a diagnosis of allergic rhinitis and sleep apnea  Shortness of breath and dyspnea on exertion is persistent No changes from baseline at this time  Chronic cough most likely from chronic allergic rhinitis Continue Zyrtec as prescribed Continue Singulair Start Nasonex nasal sprays Patient would like referral for allergy assessment I will refer to ENT  Patient with history of sleep apnea Patient with very poor compliance report I have encouraged her that she needs to be more vigilant with her CPAP adherence   Obesity -recommend significant weight loss -recommend changing diet  Deconditioned state -Recommend increased daily activity and exercise  GERD continue PPI  Overall assessment is that patient has a chronic cough that has been worked up extensively at multiple centers in the past for the last 10 years At this time I feel like her cough could be related to psychogenic causes  COVID-19 EDUCATION: The signs and symptoms of COVID-19 were discussed with the patient and how to seek care for testing.  The importance of social distancing was discussed today. Hand Washing Techniques and avoid touching face was advised.  MEDICATION ADJUSTMENTS/LABS AND TESTS ORDERED: Referral to ENT   CURRENT MEDICATIONS REVIEWED AT LENGTH WITH PATIENT TODAY   Patient satisfied with Plan of action and management. All questions answered Follow up in 6 months   Brenda Craig Patricia Pesa, M.D.  [facility]  Medical Director [place] Director [facility] Cardio-Pulmonary Department

## 2018-09-08 ENCOUNTER — Ambulatory Visit: Payer: BC Managed Care – PPO | Admitting: Pulmonary Disease

## 2018-10-22 ENCOUNTER — Other Ambulatory Visit: Payer: Self-pay | Admitting: Internal Medicine

## 2018-10-28 ENCOUNTER — Other Ambulatory Visit: Payer: Self-pay | Admitting: Internal Medicine

## 2018-10-28 DIAGNOSIS — J309 Allergic rhinitis, unspecified: Secondary | ICD-10-CM

## 2018-11-14 ENCOUNTER — Other Ambulatory Visit: Payer: Self-pay | Admitting: Internal Medicine

## 2018-11-14 DIAGNOSIS — J309 Allergic rhinitis, unspecified: Secondary | ICD-10-CM

## 2018-11-18 ENCOUNTER — Other Ambulatory Visit: Payer: Self-pay

## 2018-11-18 DIAGNOSIS — Z20822 Contact with and (suspected) exposure to covid-19: Secondary | ICD-10-CM

## 2018-11-20 LAB — NOVEL CORONAVIRUS, NAA: SARS-CoV-2, NAA: NOT DETECTED

## 2018-12-13 ENCOUNTER — Other Ambulatory Visit: Payer: Self-pay

## 2018-12-13 ENCOUNTER — Encounter
Admission: EM | Disposition: A | Discharge status: Home / Self Care | Payer: Self-pay | Source: Home / Self Care | Attending: Pulmonary Disease

## 2018-12-13 ENCOUNTER — Encounter: Payer: Self-pay | Admitting: *Deleted

## 2018-12-13 ENCOUNTER — Emergency Department: Payer: BC Managed Care – PPO

## 2018-12-13 ENCOUNTER — Inpatient Hospital Stay: Payer: BC Managed Care – PPO | Admitting: Anesthesiology

## 2018-12-13 ENCOUNTER — Inpatient Hospital Stay
Admission: EM | Admit: 2018-12-13 | Discharge: 2018-12-18 | DRG: 853 | Disposition: A | Discharge status: Home / Self Care | Payer: BC Managed Care – PPO | Attending: Internal Medicine | Admitting: Internal Medicine

## 2018-12-13 DIAGNOSIS — Z8674 Personal history of sudden cardiac arrest: Secondary | ICD-10-CM

## 2018-12-13 DIAGNOSIS — I110 Hypertensive heart disease with heart failure: Secondary | ICD-10-CM | POA: Diagnosis present

## 2018-12-13 DIAGNOSIS — Z20828 Contact with and (suspected) exposure to other viral communicable diseases: Secondary | ICD-10-CM | POA: Diagnosis present

## 2018-12-13 DIAGNOSIS — Z7951 Long term (current) use of inhaled steroids: Secondary | ICD-10-CM | POA: Diagnosis not present

## 2018-12-13 DIAGNOSIS — R6520 Severe sepsis without septic shock: Secondary | ICD-10-CM | POA: Diagnosis not present

## 2018-12-13 DIAGNOSIS — Z955 Presence of coronary angioplasty implant and graft: Secondary | ICD-10-CM

## 2018-12-13 DIAGNOSIS — E785 Hyperlipidemia, unspecified: Secondary | ICD-10-CM | POA: Diagnosis present

## 2018-12-13 DIAGNOSIS — I5032 Chronic diastolic (congestive) heart failure: Secondary | ICD-10-CM | POA: Diagnosis present

## 2018-12-13 DIAGNOSIS — Z8249 Family history of ischemic heart disease and other diseases of the circulatory system: Secondary | ICD-10-CM

## 2018-12-13 DIAGNOSIS — A419 Sepsis, unspecified organism: Secondary | ICD-10-CM

## 2018-12-13 DIAGNOSIS — N135 Crossing vessel and stricture of ureter without hydronephrosis: Secondary | ICD-10-CM | POA: Diagnosis not present

## 2018-12-13 DIAGNOSIS — G4730 Sleep apnea, unspecified: Secondary | ICD-10-CM | POA: Diagnosis present

## 2018-12-13 DIAGNOSIS — R1032 Left lower quadrant pain: Secondary | ICD-10-CM | POA: Diagnosis not present

## 2018-12-13 DIAGNOSIS — N390 Urinary tract infection, site not specified: Secondary | ICD-10-CM | POA: Diagnosis not present

## 2018-12-13 DIAGNOSIS — R6521 Severe sepsis with septic shock: Secondary | ICD-10-CM | POA: Diagnosis present

## 2018-12-13 DIAGNOSIS — Z23 Encounter for immunization: Secondary | ICD-10-CM | POA: Diagnosis not present

## 2018-12-13 DIAGNOSIS — N170 Acute kidney failure with tubular necrosis: Secondary | ICD-10-CM | POA: Diagnosis present

## 2018-12-13 DIAGNOSIS — Z8049 Family history of malignant neoplasm of other genital organs: Secondary | ICD-10-CM | POA: Diagnosis not present

## 2018-12-13 DIAGNOSIS — Z7902 Long term (current) use of antithrombotics/antiplatelets: Secondary | ICD-10-CM

## 2018-12-13 DIAGNOSIS — G92 Toxic encephalopathy: Secondary | ICD-10-CM | POA: Diagnosis present

## 2018-12-13 DIAGNOSIS — K219 Gastro-esophageal reflux disease without esophagitis: Secondary | ICD-10-CM | POA: Diagnosis present

## 2018-12-13 DIAGNOSIS — Z833 Family history of diabetes mellitus: Secondary | ICD-10-CM | POA: Diagnosis not present

## 2018-12-13 DIAGNOSIS — Z823 Family history of stroke: Secondary | ICD-10-CM | POA: Diagnosis not present

## 2018-12-13 DIAGNOSIS — I252 Old myocardial infarction: Secondary | ICD-10-CM | POA: Diagnosis not present

## 2018-12-13 DIAGNOSIS — Z87440 Personal history of urinary (tract) infections: Secondary | ICD-10-CM

## 2018-12-13 DIAGNOSIS — A4151 Sepsis due to Escherichia coli [E. coli]: Secondary | ICD-10-CM | POA: Diagnosis not present

## 2018-12-13 DIAGNOSIS — K900 Celiac disease: Secondary | ICD-10-CM | POA: Diagnosis present

## 2018-12-13 DIAGNOSIS — E7800 Pure hypercholesterolemia, unspecified: Secondary | ICD-10-CM | POA: Diagnosis present

## 2018-12-13 DIAGNOSIS — Z79899 Other long term (current) drug therapy: Secondary | ICD-10-CM

## 2018-12-13 DIAGNOSIS — N136 Pyonephrosis: Secondary | ICD-10-CM | POA: Diagnosis present

## 2018-12-13 DIAGNOSIS — I2510 Atherosclerotic heart disease of native coronary artery without angina pectoris: Secondary | ICD-10-CM | POA: Diagnosis present

## 2018-12-13 DIAGNOSIS — Z7982 Long term (current) use of aspirin: Secondary | ICD-10-CM

## 2018-12-13 DIAGNOSIS — F419 Anxiety disorder, unspecified: Secondary | ICD-10-CM | POA: Diagnosis present

## 2018-12-13 DIAGNOSIS — Z87442 Personal history of urinary calculi: Secondary | ICD-10-CM

## 2018-12-13 DIAGNOSIS — Z87410 Personal history of cervical dysplasia: Secondary | ICD-10-CM

## 2018-12-13 DIAGNOSIS — N179 Acute kidney failure, unspecified: Secondary | ICD-10-CM | POA: Diagnosis not present

## 2018-12-13 HISTORY — DX: Sepsis, unspecified organism: A41.9

## 2018-12-13 HISTORY — PX: CYSTOSCOPY W/ URETERAL STENT PLACEMENT: SHX1429

## 2018-12-13 LAB — CBC WITH DIFFERENTIAL/PLATELET
Abs Immature Granulocytes: 0 10*3/uL (ref 0.00–0.07)
Basophils Absolute: 0 10*3/uL (ref 0.0–0.1)
Basophils Relative: 0 %
Eosinophils Absolute: 0 10*3/uL (ref 0.0–0.5)
Eosinophils Relative: 0 %
HCT: 44.6 % (ref 36.0–46.0)
Hemoglobin: 14.7 g/dL (ref 12.0–15.0)
Lymphocytes Relative: 7 %
Lymphs Abs: 1.6 10*3/uL (ref 0.7–4.0)
MCH: 27.7 pg (ref 26.0–34.0)
MCHC: 33 g/dL (ref 30.0–36.0)
MCV: 84 fL (ref 80.0–100.0)
Monocytes Absolute: 1.8 10*3/uL — ABNORMAL HIGH (ref 0.1–1.0)
Monocytes Relative: 8 %
Neutro Abs: 18.9 10*3/uL — ABNORMAL HIGH (ref 1.7–7.7)
Neutrophils Relative %: 85 %
Platelets: 158 10*3/uL (ref 150–400)
RBC: 5.31 MIL/uL — ABNORMAL HIGH (ref 3.87–5.11)
RDW: 15.9 % — ABNORMAL HIGH (ref 11.5–15.5)
Smear Review: ADEQUATE
WBC: 22.2 10*3/uL — ABNORMAL HIGH (ref 4.0–10.5)
nRBC: 0 % (ref 0.0–0.2)

## 2018-12-13 LAB — COMPREHENSIVE METABOLIC PANEL
ALT: 34 U/L (ref 0–44)
AST: 53 U/L — ABNORMAL HIGH (ref 15–41)
Albumin: 3.6 g/dL (ref 3.5–5.0)
Alkaline Phosphatase: 127 U/L — ABNORMAL HIGH (ref 38–126)
Anion gap: 16 — ABNORMAL HIGH (ref 5–15)
BUN: 30 mg/dL — ABNORMAL HIGH (ref 6–20)
CO2: 19 mmol/L — ABNORMAL LOW (ref 22–32)
Calcium: 8.8 mg/dL — ABNORMAL LOW (ref 8.9–10.3)
Chloride: 102 mmol/L (ref 98–111)
Creatinine, Ser: 1.85 mg/dL — ABNORMAL HIGH (ref 0.44–1.00)
GFR calc Af Amer: 34 mL/min — ABNORMAL LOW (ref >60)
GFR calc non Af Amer: 29 mL/min — ABNORMAL LOW (ref >60)
Glucose, Bld: 129 mg/dL — ABNORMAL HIGH (ref 70–99)
Potassium: 3.3 mmol/L — ABNORMAL LOW (ref 3.5–5.1)
Sodium: 137 mmol/L (ref 135–145)
Total Bilirubin: 2.5 mg/dL — ABNORMAL HIGH (ref 0.3–1.2)
Total Protein: 7.7 g/dL (ref 6.5–8.1)

## 2018-12-13 LAB — URINALYSIS, COMPLETE (UACMP) WITH MICROSCOPIC
Bilirubin Urine: NEGATIVE
Glucose, UA: NEGATIVE mg/dL
Ketones, ur: 5 mg/dL — AB
Nitrite: NEGATIVE
Protein, ur: 100 mg/dL — AB
RBC / HPF: >50 RBC/hpf — ABNORMAL HIGH (ref 0–5)
Specific Gravity, Urine: 1.011 (ref 1.005–1.030)
pH: 5 (ref 5.0–8.0)

## 2018-12-13 LAB — SARS CORONAVIRUS 2 BY RT PCR (HOSPITAL ORDER, PERFORMED IN ~~LOC~~ HOSPITAL LAB): SARS Coronavirus 2: NEGATIVE

## 2018-12-13 LAB — PROTIME-INR
INR: 1.2 (ref 0.8–1.2)
Prothrombin Time: 15.1 seconds (ref 11.4–15.2)

## 2018-12-13 LAB — LACTIC ACID, PLASMA
Lactic Acid, Venous: 2.9 mmol/L (ref 0.5–1.9)
Lactic Acid, Venous: 3.2 mmol/L (ref 0.5–1.9)
Lactic Acid, Venous: 3.4 mmol/L (ref 0.5–1.9)

## 2018-12-13 LAB — APTT: aPTT: 51 seconds — ABNORMAL HIGH (ref 24–36)

## 2018-12-13 LAB — PROCALCITONIN: Procalcitonin: >150 ng/mL

## 2018-12-13 LAB — GLUCOSE, CAPILLARY: Glucose-Capillary: 117 mg/dL — ABNORMAL HIGH (ref 70–99)

## 2018-12-13 LAB — LIPASE, BLOOD: Lipase: 15 U/L (ref 11–51)

## 2018-12-13 SURGERY — CYSTOSCOPY, WITH RETROGRADE PYELOGRAM AND URETERAL STENT INSERTION
Anesthesia: General | Laterality: Left

## 2018-12-13 MED ORDER — IOHEXOL 180 MG/ML  SOLN
INTRAMUSCULAR | Status: DC | PRN
  Administered 2018-12-13: 20 mL

## 2018-12-13 MED ORDER — FENTANYL CITRATE (PF) 100 MCG/2ML IJ SOLN
INTRAMUSCULAR | Status: DC | PRN
  Administered 2018-12-13: 50 ug via INTRAVENOUS

## 2018-12-13 MED ORDER — ONDANSETRON HCL 4 MG/2ML IJ SOLN: 4.0000 mg | Freq: Four times a day (QID) | INTRAMUSCULAR | Status: DC | PRN

## 2018-12-13 MED ORDER — DEXMEDETOMIDINE HCL IN NACL 400 MCG/100ML IV SOLN
0.4000 ug/kg/h | INTRAVENOUS | Status: DC
  Administered 2018-12-13 – 2018-12-14 (×2): 0.6 ug/kg/h via INTRAVENOUS
  Filled 2018-12-13 (×2): qty 100

## 2018-12-13 MED ORDER — ACETAMINOPHEN 325 MG PO TABS
650.0000 mg | ORAL_TABLET | Freq: Four times a day (QID) | ORAL | Status: DC | PRN
  Administered 2018-12-14 – 2018-12-17 (×6): 650 mg via ORAL
  Filled 2018-12-13 (×7): qty 2

## 2018-12-13 MED ORDER — SODIUM CHLORIDE 0.9 % IV BOLUS (SEPSIS)
1000.0000 mL | Freq: Once | INTRAVENOUS | Status: AC
  Administered 2018-12-13: 1000 mL via INTRAVENOUS

## 2018-12-13 MED ORDER — ROCURONIUM BROMIDE 100 MG/10ML IV SOLN
INTRAVENOUS | Status: DC | PRN
  Administered 2018-12-13: 40 mg via INTRAVENOUS

## 2018-12-13 MED ORDER — TRAZODONE HCL 50 MG PO TABS
25.0000 mg | ORAL_TABLET | Freq: Every evening | ORAL | Status: DC | PRN
  Administered 2018-12-16 – 2018-12-17 (×2): 25 mg via ORAL
  Filled 2018-12-13 (×2): qty 1

## 2018-12-13 MED ORDER — ENOXAPARIN SODIUM 40 MG/0.4ML ~~LOC~~ SOLN
40.0000 mg | SUBCUTANEOUS | Status: DC
  Administered 2018-12-14 – 2018-12-15 (×2): 40 mg via SUBCUTANEOUS
  Filled 2018-12-13 (×5): qty 0.4

## 2018-12-13 MED ORDER — SUGAMMADEX SODIUM 200 MG/2ML IV SOLN
INTRAVENOUS | Status: AC
  Filled 2018-12-13: qty 2

## 2018-12-13 MED ORDER — MIDAZOLAM HCL 2 MG/2ML IJ SOLN
INTRAMUSCULAR | Status: AC
  Filled 2018-12-13: qty 2

## 2018-12-13 MED ORDER — MIDAZOLAM HCL 2 MG/2ML IJ SOLN
1.0000 mg | Freq: Once | INTRAMUSCULAR | Status: AC
  Administered 2018-12-13: 1 mg via INTRAVENOUS

## 2018-12-13 MED ORDER — ONDANSETRON HCL 4 MG/2ML IJ SOLN
INTRAMUSCULAR | Status: AC
  Filled 2018-12-13: qty 2

## 2018-12-13 MED ORDER — ROCURONIUM BROMIDE 50 MG/5ML IV SOLN
INTRAVENOUS | Status: AC
  Filled 2018-12-13: qty 1

## 2018-12-13 MED ORDER — MIDAZOLAM HCL 2 MG/2ML IJ SOLN
1.0000 mg | Freq: Once | INTRAMUSCULAR | Status: AC
  Administered 2018-12-13: 23:00:00 1 mg via INTRAVENOUS

## 2018-12-13 MED ORDER — EPHEDRINE SULFATE 50 MG/ML IJ SOLN
INTRAMUSCULAR | Status: AC
  Filled 2018-12-13: qty 1

## 2018-12-13 MED ORDER — VANCOMYCIN HCL 1.5 G IV SOLR
1500.0000 mg | Freq: Once | INTRAVENOUS | Status: AC
  Administered 2018-12-13: 1500 mg via INTRAVENOUS
  Filled 2018-12-13: qty 1500

## 2018-12-13 MED ORDER — PROPOFOL 10 MG/ML IV BOLUS
INTRAVENOUS | Status: DC | PRN
  Administered 2018-12-13: 200 mg via INTRAVENOUS

## 2018-12-13 MED ORDER — SODIUM CHLORIDE 0.9 % IV SOLN: 2.0000 g | Freq: Once | INTRAVENOUS | Status: DC

## 2018-12-13 MED ORDER — LIDOCAINE HCL (CARDIAC) PF 100 MG/5ML IV SOSY
PREFILLED_SYRINGE | INTRAVENOUS | Status: DC | PRN
  Administered 2018-12-13: 100 mg via INTRAVENOUS

## 2018-12-13 MED ORDER — HYDROMORPHONE HCL 1 MG/ML IJ SOLN
1.0000 mg | Freq: Once | INTRAMUSCULAR | Status: AC
  Administered 2018-12-13: 1 mg via INTRAVENOUS
  Filled 2018-12-13: qty 1

## 2018-12-13 MED ORDER — PROPOFOL 10 MG/ML IV BOLUS
INTRAVENOUS | Status: AC
  Filled 2018-12-13: qty 20

## 2018-12-13 MED ORDER — FENTANYL CITRATE (PF) 100 MCG/2ML IJ SOLN
25.0000 ug | INTRAMUSCULAR | Status: DC | PRN
  Administered 2018-12-13 (×4): 25 ug via INTRAVENOUS

## 2018-12-13 MED ORDER — SODIUM CHLORIDE 0.9 % IV SOLN
2.0000 g | Freq: Two times a day (BID) | INTRAVENOUS | Status: DC
  Filled 2018-12-13: qty 2

## 2018-12-13 MED ORDER — MIDAZOLAM HCL 2 MG/2ML IJ SOLN
INTRAMUSCULAR | Status: DC | PRN
  Administered 2018-12-13: 2 mg via INTRAVENOUS

## 2018-12-13 MED ORDER — MAGNESIUM HYDROXIDE 400 MG/5ML PO SUSP: 30.0000 mL | Freq: Every day | ORAL | Status: DC | PRN

## 2018-12-13 MED ORDER — FENTANYL CITRATE (PF) 100 MCG/2ML IJ SOLN
INTRAMUSCULAR | Status: AC
  Administered 2018-12-13: 25 ug via INTRAVENOUS
  Filled 2018-12-13: qty 2

## 2018-12-13 MED ORDER — LIDOCAINE HCL (PF) 2 % IJ SOLN
INTRAMUSCULAR | Status: AC
  Filled 2018-12-13: qty 10

## 2018-12-13 MED ORDER — ONDANSETRON HCL 4 MG/2ML IJ SOLN
INTRAMUSCULAR | Status: DC | PRN
  Administered 2018-12-13: 4 mg via INTRAVENOUS

## 2018-12-13 MED ORDER — CHLORHEXIDINE GLUCONATE CLOTH 2 % EX PADS
6.0000 | MEDICATED_PAD | Freq: Every day | CUTANEOUS | Status: DC
  Administered 2018-12-14 – 2018-12-15 (×2): 6 via TOPICAL

## 2018-12-13 MED ORDER — LACTATED RINGERS IV SOLN
INTRAVENOUS | Status: DC | PRN
  Administered 2018-12-13: 22:00:00 via INTRAVENOUS

## 2018-12-13 MED ORDER — FENTANYL CITRATE (PF) 100 MCG/2ML IJ SOLN
INTRAMUSCULAR | Status: AC
  Filled 2018-12-13: qty 2

## 2018-12-13 MED ORDER — PHENYLEPHRINE HCL (PRESSORS) 10 MG/ML IV SOLN
INTRAVENOUS | Status: AC
  Filled 2018-12-13: qty 1

## 2018-12-13 MED ORDER — ONDANSETRON HCL 4 MG PO TABS: 4.0000 mg | ORAL_TABLET | Freq: Four times a day (QID) | ORAL | Status: DC | PRN

## 2018-12-13 MED ORDER — DEXAMETHASONE SODIUM PHOSPHATE 10 MG/ML IJ SOLN
INTRAMUSCULAR | Status: DC | PRN
  Administered 2018-12-13: 8 mg via INTRAVENOUS

## 2018-12-13 MED ORDER — ONDANSETRON HCL 4 MG/2ML IJ SOLN: 4.0000 mg | Freq: Once | INTRAMUSCULAR | Status: DC | PRN

## 2018-12-13 MED ORDER — DEXAMETHASONE SODIUM PHOSPHATE 4 MG/ML IJ SOLN
INTRAMUSCULAR | Status: AC
  Filled 2018-12-13: qty 1

## 2018-12-13 MED ORDER — METRONIDAZOLE IN NACL 5-0.79 MG/ML-% IV SOLN
500.0000 mg | Freq: Once | INTRAVENOUS | Status: AC
  Administered 2018-12-13: 500 mg via INTRAVENOUS
  Filled 2018-12-13: qty 100

## 2018-12-13 MED ORDER — SODIUM CHLORIDE (PF) 0.9 % IJ SOLN
INTRAMUSCULAR | Status: AC
  Filled 2018-12-13: qty 10

## 2018-12-13 MED ORDER — VANCOMYCIN HCL IN DEXTROSE 1-5 GM/200ML-% IV SOLN
1000.0000 mg | Freq: Once | INTRAVENOUS | Status: AC
  Administered 2018-12-13: 1000 mg via INTRAVENOUS
  Filled 2018-12-13: qty 200

## 2018-12-13 MED ORDER — SODIUM CHLORIDE 0.9 % IV SOLN
2.0000 g | Freq: Once | INTRAVENOUS | Status: AC
  Administered 2018-12-13: 2 g via INTRAVENOUS
  Filled 2018-12-13: qty 2

## 2018-12-13 MED ORDER — PHENYLEPHRINE HCL (PRESSORS) 10 MG/ML IV SOLN
INTRAVENOUS | Status: DC | PRN
  Administered 2018-12-13: 200 ug via INTRAVENOUS
  Administered 2018-12-13: 150 ug via INTRAVENOUS
  Administered 2018-12-13: 200 ug via INTRAVENOUS
  Administered 2018-12-13: 100 ug via INTRAVENOUS

## 2018-12-13 MED ORDER — SUGAMMADEX SODIUM 200 MG/2ML IV SOLN
INTRAVENOUS | Status: DC | PRN
  Administered 2018-12-13: 200 mg via INTRAVENOUS

## 2018-12-13 MED ORDER — MIDAZOLAM HCL 2 MG/2ML IJ SOLN
INTRAMUSCULAR | Status: AC
  Administered 2018-12-13: 1 mg via INTRAVENOUS
  Filled 2018-12-13: qty 2

## 2018-12-13 MED ORDER — ACETAMINOPHEN 650 MG RE SUPP: 650.0000 mg | Freq: Four times a day (QID) | RECTAL | Status: DC | PRN

## 2018-12-13 MED ORDER — SODIUM CHLORIDE 0.9 % IV SOLN
INTRAVENOUS | Status: AC
  Filled 2018-12-13: qty 2

## 2018-12-13 MED ORDER — SODIUM CHLORIDE 0.9 % IV SOLN
INTRAVENOUS | Status: DC
  Administered 2018-12-13 – 2018-12-14 (×2): via INTRAVENOUS

## 2018-12-13 SURGICAL SUPPLY — 18 items
BAG DRAIN CYSTO-URO LG1000N (MISCELLANEOUS) ×2 IMPLANT
CATH FOL 2WAY LX 16X5 (CATHETERS) ×2 IMPLANT
CATH URETL 5X70 OPEN END (CATHETERS) ×2 IMPLANT
GLOVE BIO SURGEON STRL SZ8 (GLOVE) ×2 IMPLANT
GOWN STRL REUS W/ TWL LRG LVL4 (GOWN DISPOSABLE) ×1 IMPLANT
GOWN STRL REUS W/ TWL XL LVL3 (GOWN DISPOSABLE) ×1 IMPLANT
GOWN STRL REUS W/TWL LRG LVL4 (GOWN DISPOSABLE) ×1
GOWN STRL REUS W/TWL XL LVL3 (GOWN DISPOSABLE) ×1
GUIDEWIRE STR DUAL SENSOR (WIRE) ×4 IMPLANT
KIT TURNOVER CYSTO (KITS) ×2 IMPLANT
PACK CYSTO AR (MISCELLANEOUS) ×2 IMPLANT
SET CYSTO W/LG BORE CLAMP LF (SET/KITS/TRAYS/PACK) ×2 IMPLANT
SOL .9 NS 3000ML IRR  AL (IV SOLUTION) ×1
SOL .9 NS 3000ML IRR UROMATIC (IV SOLUTION) ×1 IMPLANT
STENT URET 6FRX24 CONTOUR (STENTS) IMPLANT
STENT URET 6FRX26 CONTOUR (STENTS) IMPLANT
STENT URET 6FRX26 FIRM (STENTS) ×2 IMPLANT
WATER STERILE IRR 1000ML POUR (IV SOLUTION) ×2 IMPLANT

## 2018-12-13 NOTE — ED Notes (Signed)
Two unsuccessful attempts at collecting blood for Lactic test. Will inform OR RN.

## 2018-12-13 NOTE — Anesthesia Post-op Follow-up Note (Signed)
Anesthesia QCDR form completed.        

## 2018-12-13 NOTE — ED Notes (Signed)
Patient taken to CT scan.

## 2018-12-13 NOTE — Progress Notes (Signed)
PHARMACY -  BRIEF ANTIBIOTIC NOTE   Pharmacy has received consult(s) for Cefepime, Vancomycin from an ED provider.  The patient's profile has been reviewed for ht/wt/allergies/indication/available labs.    One time order(s) placed for Cefepime 2 gm IV X 1 and Vancomycin 2500 mg IV X 1.   Further antibiotics/pharmacy consults should be ordered by admitting physician if indicated.                       Thank you, Citlali Gautney D 12/13/2018  5:54 PM

## 2018-12-13 NOTE — ED Provider Notes (Signed)
[facility] Emergency Department Provider Note  ____________________________________________  Time seen: Approximately 6:21 PM  I have reviewed the triage vital signs and the nursing notes.   HISTORY  Chief Complaint Altered Mental Status    Level 5 Caveat: Portions of the History and Physical including HPI and review of systems are unable to be completely obtained due to patient being a poor historian due to acute confusion  HPI Brenda Craig is a 58 y.o. female with a history of acid reflux, congestive heart failure, CAD, hypertension  who was brought to the ED by EMS due to confusion.  Patient reportedly has had left-sided abdominal pain for the past 3 days which has been constant and gradually worsening without aggravating or alleviating factors.  Also reported to have a fever of 103 yesterday although EMS reported a temperature of 99 today for them.  No vomiting or diarrhea.  No known black or bloody stool.     Past Medical History:  Diagnosis Date  . Abnormal Pap smear of cervix 1988  . Acid reflux 01/17/2013  . Allergic rhinitis   . Anemia   . Anxiety   . Asthma   . Celiac disease   . Cervical dysplasia 1988  . CHF (congestive heart failure) ([place])   . Coronary artery disease   . Heart trouble   . History of cardiac arrest   . History of mammogram 03/25/2013; 12/07/14   BIRADS 1; NEG  . History of Papanicolaou smear of cervix 03/20/12; 12/28/15   -/-; -/-  . Hypercholesteremia   . Hypertension   . Menopausal symptoms   . MI (myocardial infarction) ([place])   . Mitral valve prolapse   . Sleep apnea   . Vulvovaginitis    CHRONIC VULVAR [place] CREAM C SOME RELIEF     Patient Active Problem List   Diagnosis Date Noted  . OA (osteoarthritis) of knee 05/21/2016  . Atypical chest pain 12/20/2015  . Diarrhea   . Chronic cough 03/28/2015  . Colon polyp 03/28/2015  . DU (duodenal ulcer) 03/28/2015  . Hemorrhoid 03/28/2015  .  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 Calculus of kidney 03/28/2015  . Kidney cysts 03/28/2015  . Displacement of lumbar intervertebral disc without myelopathy 02/14/2015  . Angina pectoris ([place])   . CAD in native artery   . Back pain   . S/P coronary artery stent placement   . Hyperlipidemia   . Encounter for preprocedural cardiovascular examination 05/30/2014  . Excess weight 08/11/2013  . Celiac disease 07/22/2013  . Transient blindness of right eye 05/25/2013  . Acid reflux 01/17/2013  . Airway hyperreactivity 01/17/2013  . Anxiety 01/17/2013  . Arteriosclerosis of coronary artery 01/17/2013  . HLD (hyperlipidemia) 01/17/2013  . BP (high blood pressure) 01/17/2013  . Atherosclerosis of coronary artery 10/31/2012  . Essential hypertension 10/31/2012  . Hypercholesterolemia 10/31/2012  . Presence of stent in anterior descending branch of left coronary artery 10/31/2012  . Presence of stent in coronary artery 10/31/2012     Past Surgical History:  Procedure Laterality Date  . ABLATION  2013   [facility]  . BACK SURGERY    . BACK SURGERY  2016; 2017   L4, L5  . CARDIAC SURGERY  03/19/12; 10/2014   HEART CATH AND STENT  . [place]; 1989  . COLD KNIFE [place] BIOPSY  1988  . COLONOSCOPY  08/2010   16 POLYPS (ALL BENIGN) DX  C CELIAC DISEASE  . COMBINED HYSTEROSCOPY DIAGNOSTIC / D&C  2013   [facility]  . DILATION AND CURETTAGE OF UTERUS  2001  . ESOPHAGOGASTRODUODENOSCOPY  08/2010   DX C CELIAC DISEASE  . HEART STENTS  05/27/2011  . HIP SURGERY  2016   RIGHT IT BAND AND BURSA REMOVAL  . OOPHORECTOMY Right 1982     Prior to Admission medications   Medication Sig Start Date End Date Taking? Authorizing Provider  albuterol (PROVENTIL) (2.5 MG/3ML) 0.083% nebulizer solution Take 3 mLs (2.5 mg total) by nebulization every 6 (six) hours as needed for wheezing or shortness of breath. 06/06/18   Flora Lipps, MD  amLODipine (NORVASC) 5 MG tablet  Take 1 tablet (5 mg total) by mouth daily. 05/23/16   Vaughan Basta, MD  budesonide (PULMICORT) 0.5 MG/2ML nebulizer solution Take 2 mLs (0.5 mg total) by nebulization 2 (two) times daily. 04/17/17   Laverle Hobby, MD  cetirizine (ZYRTEC) 10 MG tablet TAKE 1 TABLET BY MOUTH EVERY DAY 10/22/18   Flora Lipps, MD  chlorpheniramine-HYDROcodone (TUSSIONEX) 10-8 MG/5ML SUER TAKE 5 MILLILITERS BY ORAL ROUTE EVERY 12 HOURS 07/02/18   [provider]  clotrimazole-betamethasone (LOTRISONE) cream AAA BID prn sx for up to 2 wks [id number]   Copland, Alicia B, PA-C  [facility] ASPIRIN ADULT LOW DOSE 81 MG chewable tablet Chew 81 mg by mouth daily. 05/27/18   [provider]  isosorbide mononitrate (IMDUR) 30 MG 24 hr tablet Take 15 mg by mouth daily.  03/26/15   [provider]  LORazepam (ATIVAN) 1 MG tablet Take 1 mg by mouth 2 (two) times daily.  11/14/12   [provider]  mometasone (NASONEX) 50 MCG/ACT nasal spray PLACE 2 SPRAYS INTO THE NOSE DAILY. 11/17/18 11/17/19  Flora Lipps, MD  montelukast (SINGULAIR) 10 MG tablet Take 10 mg by mouth at bedtime.    [provider]  NITROSTAT 0.4 MG SL tablet Place 0.4 mg under the tongue every 5 (five) minutes as needed for chest pain.  11/03/12   [provider]  pantoprazole (PROTONIX) 20 MG tablet Take 20 mg by mouth 2 (two) times daily.  11/17/12   [provider]  PROAIR HFA 108 (90 Base) MCG/ACT inhaler Inhale 2 puffs into the lungs every 6 (six) hours as needed. 03/26/16   [provider]  propranolol (INDERAL) 10 MG tablet Take by mouth. 11/07/17 02/05/18  [provider]  rosuvastatin (CRESTOR) 10 MG tablet Take 10 mg by mouth at bedtime.  10/16/12   [provider]  SYMBICORT 160-4.5 MCG/ACT inhaler TAKE 2 PUFFS BY MOUTH TWICE A DAY 02/24/18   Flora Lipps, MD  ticagrelor (BRILINTA) 60 MG TABS tablet Take 60 mg by mouth 2 (two) times daily.  11/06/12   [provider]  Tiotropium Bromide Monohydrate (SPIRIVA RESPIMAT) 1.25 MCG/ACT AERS Inhale 2 puffs into the lungs daily. 06/06/18   Flora Lipps, MD     Allergies Codeine, Nexium [esomeprazole magnesium], Omeprazole magnesium, Oxycodone-acetaminophen, and Percocet [oxycodone-acetaminophen]   Family History  Problem Relation Age of Onset  . CAD Father   . Transient ischemic attack Father   . Diabetes Father   . Cerebrovascular Accident Father   . Heart disease Father        M-MVP; F BETA;BLOCKERS  . Hypothyroidism Father   . Hyperthyroidism Mother   . Cancer Maternal Uncle        KIDNEY  . Uterine cancer Maternal Aunt 69    Social History Social  History   Tobacco Use  . Smoking status: Never Smoker  . Smokeless tobacco: Never Used  Substance Use Topics  . Alcohol use: No    Frequency: Never  . Drug use: No    Review of Systems Level 5 Caveat: Portions of the History and Physical including HPI and review of systems are unable to be completely obtained due to patient being a poor historian   Constitutional:   Positive fever.  ENT:   No rhinorrhea. Cardiovascular:   No chest pain or syncope. Respiratory:   No dyspnea or cough. Gastrointestinal:   Positive abdominal pain without vomiting and diarrhea.  Musculoskeletal:   Negative for focal pain or swelling ____________________________________________   PHYSICAL EXAM:  VITAL SIGNS: ED Triage Vitals  Enc Vitals Group     BP 12/13/18 1736 137/89     Pulse Rate 12/13/18 1736 (!) 148     Resp 12/13/18 1736 (!) 28     Temp 12/13/18 1736 99.3 F (37.4 C)     Temp Source 12/13/18 1736 Oral     SpO2 12/13/18 1736 94 %     Weight 12/13/18 1745 236 lb (107 kg)     Height 12/13/18 1745 5\' 5"  (1.651 m)     Head Circumference --      Peak Flow --      Pain Score 12/13/18 1737 0     Pain Loc --      Pain Edu? --      Excl. in [place]? --     Vital signs reviewed, nursing assessments reviewed.   Constitutional: Awake and  alert.  Oriented to self.  Ill-appearing Eyes:   Conjunctivae are normal. EOMI. PERRL. ENT      Head:   Normocephalic and atraumatic.      Nose:   No congestion/rhinnorhea.       Mouth/Throat:   Very dry mucous membranes, no pharyngeal erythema. No peritonsillar mass.       Neck:   No meningismus. Full ROM. Hematological/Lymphatic/Immunilogical:   No cervical lymphadenopathy. Cardiovascular:   Tachycardia heart rate 140. Symmetric thready bilateral radial and DP pulses.  No murmurs. Cap refill less than 2 seconds. Respiratory: Tachypnea.  No retractions.  Breath sounds are clear and equal bilaterally. No wheezes/rales/rhonchi. Gastrointestinal:   Soft with diffuse tenderness.  Mildly distended. There is no CVA tenderness.  No rebound, rigidity, or guarding. Musculoskeletal:   Normal range of motion in all extremities. No joint effusions.  No lower extremity tenderness.  No edema. Neurologic:   Normal speech and language.  Motor grossly intact. No acute focal neurologic deficits are appreciated.  Skin:    Skin is warm, dry and intact.  Livedo reticularis present.  No petechiae, purpura, or bullae.  ____________________________________________    LABS (pertinent positives/negatives) (all labs ordered are listed, but only abnormal results are displayed) Labs Reviewed  LACTIC ACID, PLASMA - Abnormal; Notable for the following components:      Result Value   Lactic Acid, Venous 2.9 (*)    All other components within normal limits  LACTIC ACID, PLASMA - Abnormal; Notable for the following components:   Lactic Acid, Venous 3.2 (*)    All other components within normal limits  COMPREHENSIVE METABOLIC PANEL - Abnormal; Notable for the following components:   Potassium 3.3 (*)    CO2 19 (*)    Glucose, Bld 129 (*)    BUN 30 (*)    Creatinine, Ser 1.85 (*)    Calcium 8.8 (*)  AST 53 (*)    Alkaline Phosphatase 127 (*)    Total Bilirubin 2.5 (*)    GFR calc non Af Amer 29 (*)    GFR  calc Af Amer 34 (*)    Anion gap 16 (*)    All other components within normal limits  CBC WITH DIFFERENTIAL/PLATELET - Abnormal; Notable for the following components:   WBC 22.2 (*)    RBC 5.31 (*)    RDW 15.9 (*)    Neutro Abs 18.9 (*)    Monocytes Absolute 1.8 (*)    All other components within normal limits  APTT - Abnormal; Notable for the following components:   aPTT 51 (*)    All other components within normal limits  URINALYSIS, COMPLETE (UACMP) WITH MICROSCOPIC - Abnormal; Notable for the following components:   Color, Urine YELLOW (*)    APPearance CLOUDY (*)    Hgb urine dipstick LARGE (*)    Ketones, ur 5 (*)    Protein, ur 100 (*)    Leukocytes,Ua TRACE (*)    RBC / HPF >50 (*)    Bacteria, UA RARE (*)    All other components within normal limits  SARS CORONAVIRUS 2 BY RT PCR (HOSPITAL ORDER, [place] [facility])  CULTURE, BLOOD (ROUTINE X 2)  CULTURE, BLOOD (ROUTINE X 2)  URINE CULTURE  LIPASE, BLOOD  PROCALCITONIN  PROTIME-INR  LACTIC ACID, PLASMA  POC SARS CORONAVIRUS 2 AG -  ED   ____________________________________________   EKG  Interpreted by me Sinus tachycardia rate 140, right axis, normal intervals.  Normal QRS ST segments and T waves.  No acute ischemic changes.  ____________________________________________    RADIOLOGY  Ct Abdomen Pelvis Wo Contrast  Result Date: 12/13/2018 CLINICAL DATA:  Altered mental status, LEFT side abdominal pain for 3 days, febrile yesterday to 103 degrees, history asthma, celiac disease, CHF, coronary artery disease post MI and coronary stenting, hypertension EXAM: CT CHEST, ABDOMEN AND PELVIS WITHOUT CONTRAST TECHNIQUE: Multidetector CT imaging of the chest, abdomen and pelvis was performed following the standard protocol without IV contrast. Sagittal and coronal MPR images reconstructed from axial data set. Oral contrast was not administered. COMPARISON:  CT angio chest 05/21/2016, CT abdomen and  pelvis 10/03/2011 FINDINGS: CT CHEST FINDINGS Cardiovascular: Aorta normal caliber without aneurysm or dissection. Atherosclerotic calcifications within coronary arteries with note of coronary stent. Heart normal size. No pericardial effusion. Mediastinum/Nodes: Base of cervical region normal appearance. Esophagus unremarkable. No thoracic adenopathy. Lungs/Pleura: Subsegmental atelectasis at minor fissure. Lungs otherwise clear. No infiltrate, pleural effusion or pneumothorax. Musculoskeletal: Osseous structures unremarkable. CT ABDOMEN PELVIS FINDINGS Hepatobiliary: Distended gallbladder 5 cm transverse. Liver unremarkable. Pancreas: Normal appearance Spleen: Normal appearance. Small splenule anterior to spleen. Adrenals/Urinary Tract: Adrenal glands normal appearance. Large cyst upper to mid RIGHT kidney 5.7 x 5.5 cm image 64. Small parenchymal calcification versus nonobstructing calculus at upper pole RIGHT kidney. LEFT hydronephrosis secondary to a 14 x 9 mm LEFT UPJ calculus. Mild LEFT perinephric edema and LEFT renal enlargement. Additional tiny nonobstructing renal calculi. Ureters decompressed without calcification. Bladder unremarkable. Stomach/Bowel: Normal appendix. Small to moderate-sized hiatal hernia. Remainder of stomach and bowel loops unremarkable. Vascular/Lymphatic: Atherosclerotic calcifications aorta and iliac arteries without aneurysm. No adenopathy. Reproductive: Atrophic uterus and ovaries Other: No free air or free fluid. No hernia or acute inflammatory process otherwise seen. Musculoskeletal: Osseous structures unremarkable. IMPRESSION: LEFT hydronephrosis secondary to a 14 x 9 mm LEFT UPJ calculus. Additional tiny nonobstructing renal calculi bilaterally. Small  to moderate-sized hiatal hernia. Coronary artery calcifications with note of coronary stent. Distended gallbladder. Aortic Atherosclerosis (ICD10-I70.0). Electronically Signed   By: Lavonia Dana M.D.   On: 12/13/2018 19:05   Ct  Chest Wo Contrast  Result Date: 12/13/2018 CLINICAL DATA:  Altered mental status, LEFT side abdominal pain for 3 days, febrile yesterday to 103 degrees, history asthma, celiac disease, CHF, coronary artery disease post MI and coronary stenting, hypertension EXAM: CT CHEST, ABDOMEN AND PELVIS WITHOUT CONTRAST TECHNIQUE: Multidetector CT imaging of the chest, abdomen and pelvis was performed following the standard protocol without IV contrast. Sagittal and coronal MPR images reconstructed from axial data set. Oral contrast was not administered. COMPARISON:  CT angio chest 05/21/2016, CT abdomen and pelvis 10/03/2011 FINDINGS: CT CHEST FINDINGS Cardiovascular: Aorta normal caliber without aneurysm or dissection. Atherosclerotic calcifications within coronary arteries with note of coronary stent. Heart normal size. No pericardial effusion. Mediastinum/Nodes: Base of cervical region normal appearance. Esophagus unremarkable. No thoracic adenopathy. Lungs/Pleura: Subsegmental atelectasis at minor fissure. Lungs otherwise clear. No infiltrate, pleural effusion or pneumothorax. Musculoskeletal: Osseous structures unremarkable. CT ABDOMEN PELVIS FINDINGS Hepatobiliary: Distended gallbladder 5 cm transverse. Liver unremarkable. Pancreas: Normal appearance Spleen: Normal appearance. Small splenule anterior to spleen. Adrenals/Urinary Tract: Adrenal glands normal appearance. Large cyst upper to mid RIGHT kidney 5.7 x 5.5 cm image 64. Small parenchymal calcification versus nonobstructing calculus at upper pole RIGHT kidney. LEFT hydronephrosis secondary to a 14 x 9 mm LEFT UPJ calculus. Mild LEFT perinephric edema and LEFT renal enlargement. Additional tiny nonobstructing renal calculi. Ureters decompressed without calcification. Bladder unremarkable. Stomach/Bowel: Normal appendix. Small to moderate-sized hiatal hernia. Remainder of stomach and bowel loops unremarkable. Vascular/Lymphatic: Atherosclerotic calcifications aorta  and iliac arteries without aneurysm. No adenopathy. Reproductive: Atrophic uterus and ovaries Other: No free air or free fluid. No hernia or acute inflammatory process otherwise seen. Musculoskeletal: Osseous structures unremarkable. IMPRESSION: LEFT hydronephrosis secondary to a 14 x 9 mm LEFT UPJ calculus. Additional tiny nonobstructing renal calculi bilaterally. Small to moderate-sized hiatal hernia. Coronary artery calcifications with note of coronary stent. Distended gallbladder. Aortic Atherosclerosis (ICD10-I70.0). Electronically Signed   By: Lavonia Dana M.D.   On: 12/13/2018 19:05   Dg Chest Port 1 View  Result Date: 12/13/2018 CLINICAL DATA:  Altered mental status. EXAM: PORTABLE CHEST 1 VIEW COMPARISON:  06/06/2018 FINDINGS: Lung volumes are low with linear opacities at the lung bases bilaterally. No signs of dense consolidation or evidence of pleural effusion. No acute bone finding. IMPRESSION: Low lung volumes with basilar atelectasis. No consolidation or pleural effusion. Electronically Signed   By: Zetta Bills M.D.   On: 12/13/2018 18:33   Dg Or Urology Cysto Image ([facility] Only)  Result Date: 12/13/2018 There is no interpretation for this exam.  This order is for images obtained during a surgical procedure.  Please See "Surgeries" Tab for more information regarding the procedure.   US Abdomen Limited Ruq  Result Date: 12/13/2018 CLINICAL DATA:  Abdominal pain, sepsis, and elevated LFTs. EXAM: ULTRASOUND ABDOMEN LIMITED RIGHT UPPER QUADRANT COMPARISON:  None. FINDINGS: Gallbladder: The gallbladder is somewhat distended measuring up to 5.8 cm. No wall thickening, stones, sludge, or Murphy's sign. No pericholecystic fluid. Common bile duct: Diameter: 5.4 mm Liver: No focal lesion identified. Within normal limits in parenchymal echogenicity. Portal vein is patent on color Doppler imaging with normal direction of blood flow towards the liver. Other: None. IMPRESSION: 1. The gallbladder is  distended to 5.8 cm with no stones, wall thickening, or Murphy's sign identified. If  there is concern for acalculous cholecystitis, a HIDA scan could be performed. 2. Probable hepatic steatosis. Electronically Signed   By: Dorise Bullion III M.D   On: 12/13/2018 20:45    ____________________________________________   PROCEDURES .Critical Care Performed by: Carrie Mew, MD Authorized by: Carrie Mew, MD   Critical care provider statement:    Critical care time (minutes):  35   Critical care time was exclusive of:  Separately billable procedures and treating other patients   Critical care was necessary to treat or prevent imminent or life-threatening deterioration of the following conditions:  Sepsis, CNS failure or compromise, dehydration and shock   Critical care was time spent personally by me on the following activities:  Development of treatment plan with patient or surrogate, discussions with consultants, evaluation of patient's response to treatment, examination of patient, obtaining history from patient or surrogate, ordering and performing treatments and interventions, ordering and review of laboratory studies, ordering and review of radiographic studies, pulse oximetry, re-evaluation of patient's condition and review of old charts    ____________________________________________  DIFFERENTIAL DIAGNOSIS   Sepsis due to UTI, pneumonia, Covid, intra-abdominal infection such as cholecystitis, pancreatitis, bowel obstruction, diverticulitis, intestinal perforation, intra-abdominal abscess  CLINICAL IMPRESSION / ASSESSMENT AND PLAN / ED COURSE  Medications ordered in the ED: Medications  vancomycin (VANCOCIN) 1,500 mg in sodium chloride 0.9 % 500 mL IVPB (1,500 mg Intravenous New Bag/Given 12/13/18 1959)  sodium chloride 0.9 % bolus 1,000 mL (0 mLs Intravenous Stopped 12/13/18 1927)    And  sodium chloride 0.9 % bolus 1,000 mL (0 mLs Intravenous Stopped 12/13/18 1928)     And  sodium chloride 0.9 % bolus 1,000 mL (0 mLs Intravenous Stopped 12/13/18 2111)  ceFEPIme (MAXIPIME) 2 g in sodium chloride 0.9 % 100 mL IVPB (0 g Intravenous Stopped 12/13/18 1826)  metroNIDAZOLE (FLAGYL) IVPB 500 mg (0 mg Intravenous Stopped 12/13/18 2112)  vancomycin (VANCOCIN) IVPB 1000 mg/200 mL premix (0 mg Intravenous Stopped 12/13/18 1904)  HYDROmorphone (DILAUDID) injection 1 mg (1 mg Intravenous Given 12/13/18 1943)    Pertinent labs & imaging results that were available during my care of the patient were reviewed by me and considered in my medical decision making (see chart for details).   Brenda Craig was evaluated in Emergency Department on 12/13/2018 for the symptoms described in the history of present illness. She was evaluated in the context of the global COVID-19 pandemic, which necessitated consideration that the patient might be at risk for infection with the SARS-CoV-2 virus that causes COVID-19. Institutional protocols and algorithms that pertain to the evaluation of patients at risk for COVID-19 are in a state of rapid change based on information released by regulatory bodies including the CDC and federal and state organizations. These policies and algorithms were followed during the patient's care in the ED.   Patient presents with fever tachypnea tachycardia and confusion, all concerning for sepsis.  Leading suspicion is an intra-abdominal source.  Code sepsis initiated on initial assessment, empiric antibiotics with cefepime, vancomycin, Flagyl.  Will start large-volume fluid resuscitation of 3 L normal saline given clinical signs of shock including libido, thready pulses, pronounced tachycardia.  Clinical Course as of Dec 12 2113  Sat Dec 13, 2018  1816 Labs reveal leukocytosis 22,000, lactic acidosis of 2.9, acute kidney injury with creatinine of 1.8 compared to baseline of 0.8.  There is an elevated anion gap.  Also slightly elevated bilirubin.  This is all concerning  for severe sepsis, possible from a  biliary source.   [PS]  [id number] Chest x-ray image viewed by me, overall unremarkable, no clear pneumonia or other airspace disease.  Bones and mediastinum appear unremarkable.   [PS]  [id number] Consistent with acute bacterial infection causing sepsis.  Will obtain CT scan chest abdomen pelvis for further clarity on potential source.  Procalcitonin: >150.00 [PS]  1907 Sepsis reassessment is been completed.   [PS]  2036 Urinalysis not clearly consistent with UTI, but ultrasound of the gallbladder is essentially normal and shows no signs of cholecystitis.  Urology paged to evaluate.  Lactate has increased from 2.9-3.2 despite IV fluid resuscitation.   [PS]  2036 Blood pressure remained stable.  Tachycardia improved, capillary refill remains normal.   [PS]  2055 Discussed with urology Dr. Jeffie Pollock who will plan to take the OR for cystoscopy and ureteral stenting. Hospitalist consult placed for admission.    [PS]    Clinical Course User Index [PS] Carrie Mew, MD    ----------------------------------------- 9:15 PM on 12/13/2018 -----------------------------------------  Discussed with hospitalist Dr. Sidney Ace for evaluation.  Remains hemodynamically stable.   ____________________________________________   FINAL CLINICAL IMPRESSION(S) / ED DIAGNOSES    Final diagnoses:  Severe sepsis ([place])  Obstruction of left ureter  AKI (acute kidney injury) ([place])  Left lower quadrant abdominal pain     ED Discharge Orders    None      Portions of this note were generated with dragon dictation software. Dictation errors may occur despite best attempts at proofreading.   Carrie Mew, MD 12/13/18 2204

## 2018-12-13 NOTE — H&P (Signed)
Brenda Craig at [place] NAME: Brenda Craig    MR#:  [id number]  DATE OF BIRTH:  March 13, 1960  DATE OF ADMISSION:  12/13/2018  PRIMARY CARE PHYSICIAN: Albina Billet, MD   REQUESTING/REFERRING PHYSICIAN: Brenton Grills, MD CHIEF COMPLAINT:   Chief Complaint  Patient presents with   Altered Mental Status    HISTORY OF PRESENT ILLNESS:  Brenda Craig  is a 58 y.o. Caucasian  female with a known history of coronary artery disease, CHF, asthma and GERD, who presented to the emergency room with acute onset of altered mental status with left-sided abdominal pain with associated flank pain, mild dysuria without urinary urgency or frequency or hematuria.  She admits to fever and chills as well as nausea and vomiting..  T-max has been up to 103.  She has been having mild dyspnea without cough or wheezing.  She denied any sick exposure to COVID-19.  No chest pain or palpitations.  Upon presentation to the emergency room, heart rate was 128 with respiratory to 28, temperature 99.3 and pulse oximetry 94% on room air with a blood pressure is 137/89.  Labs revealed lactic acid of 2.9 INR 3.4 with procalcitonin more than 150, leukocytosis of 22.2 with neutrophilia.  Urinalysis was strongly positive for UTI.  Abdominal pelvic CT scan revealed left hydronephrosis secondary to 14 X 9 mm left UPJ calculus with additional tiny nonobstructing renal calculi bilaterally and small to moderate sized hiatal hernia, coronary artery calcifications with note of coronary stent and distended gallbladder as well as aortic atherosclerosis and subsegmental atelectasis with clear lungs otherwise.  The patient was given IV cefepime and vancomycin as well as Flagyl in the ER, 1 mg of IV Dilaudid and 3 L bolus of IV normal saline.  She was then taken to the OR by Dr. Jeffie Pollock for cystoureteroscopy.  She apparently became hypotensive better requiring IV pressor therapy and will be admitted to an ICU bed  for further management.   PAST MEDICAL HISTORY:   Past Medical History:  Diagnosis Date   Abnormal Pap smear of cervix 1988   Acid reflux 01/17/2013   Allergic rhinitis    Anemia    Anxiety    Asthma    Celiac disease    Cervical dysplasia 1988   CHF (congestive heart failure) (HCC)    Coronary artery disease    Heart trouble    History of cardiac arrest    History of mammogram 03/25/2013; 12/07/14   BIRADS 1; NEG   History of Papanicolaou smear of cervix 03/20/12; 12/28/15   -/-; -/-   Hypercholesteremia    Hypertension    Menopausal symptoms    MI (myocardial infarction) ([facility] [place])    Mitral valve prolapse    Sleep apnea    Vulvovaginitis    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    PAST SURGICAL HISTORY:   Past Surgical History:  Procedure Laterality Date   ABLATION  2013   [facility]   BACK SURGERY     BACK SURGERY  2016; 2017   L4, L5   CARDIAC SURGERY  03/19/12; 10/2014   HEART CATH AND STENT   CESAREAN SECTION     1986; [place]  08/2010   16 POLYPS (ALL BENIGN) DX C CELIAC DISEASE   COMBINED HYSTEROSCOPY DIAGNOSTIC / D&C  2013   [facility]   DILATION AND CURETTAGE OF UTERUS  2001  ESOPHAGOGASTRODUODENOSCOPY  08/2010   DX C CELIAC DISEASE   HEART STENTS  05/27/2011   HIP SURGERY  2016   RIGHT IT BAND AND BURSA REMOVAL   OOPHORECTOMY Right 1982    SOCIAL HISTORY:   Social History   Tobacco Use   Smoking status: Never Smoker   Smokeless tobacco: Never Used  Substance Use Topics   Alcohol use: No    Frequency: Never    FAMILY HISTORY:   Family History  Problem Relation Age of Onset   CAD Father    Transient ischemic attack Father    Diabetes Father    Cerebrovascular Accident Father    Heart disease Father        M-MVP; F BETA;BLOCKERS   Hypothyroidism Father    Hyperthyroidism Mother    Cancer Maternal Uncle        KIDNEY   Uterine cancer  Maternal Aunt 69    DRUG ALLERGIES:   Allergies  Allergen Reactions   Codeine Nausea And Vomiting and Rash   Nexium [Esomeprazole Magnesium] Palpitations   Omeprazole Magnesium Palpitations   Oxycodone-Acetaminophen Itching, Nausea And Vomiting and Rash   Percocet [Oxycodone-Acetaminophen] Itching, Nausea And Vomiting and Rash    REVIEW OF SYSTEMS:   ROS As per history of present illness. All pertinent systems were reviewed above. Constitutional,  HEENT, cardiovascular, respiratory, GI, GU, musculoskeletal, neuro, psychiatric, endocrine,  integumentary and hematologic systems were reviewed and are otherwise  negative/unremarkable except for positive findings mentioned above in the HPI.   MEDICATIONS AT HOME:   Prior to Admission medications   Medication Sig Start Date End Date Taking? Authorizing Provider  albuterol (PROVENTIL) (2.5 MG/3ML) 0.083% nebulizer solution Take 3 mLs (2.5 mg total) by nebulization every 6 (six) hours as needed for wheezing or shortness of breath. 06/06/18  Yes Kasa, Maretta Bees, MD  amLODipine (NORVASC) 5 MG tablet Take 1 tablet (5 mg total) by mouth daily. 05/23/16  Yes Vaughan Basta, MD  azelastine (ASTELIN) 0.1 % nasal spray Place 2 sprays into both nostrils 2 (two) times daily. 11/07/18  Yes [provider]  budesonide (PULMICORT) 0.5 MG/2ML nebulizer solution Take 2 mLs (0.5 mg total) by nebulization 2 (two) times daily. 04/17/17  Yes Laverle Hobby, MD  cetirizine (ZYRTEC) 10 MG tablet TAKE 1 TABLET BY MOUTH EVERY DAY 10/22/18  Yes Kasa, Maretta Bees, MD  chlorpheniramine-HYDROcodone (TUSSIONEX) 10-8 MG/5ML SUER TAKE 5 MILLILITERS BY ORAL ROUTE EVERY 12 HOURS 07/02/18  Yes [provider]  [facility] ASPIRIN ADULT LOW DOSE 81 MG chewable tablet Chew 81 mg by mouth daily. 05/27/18  Yes [provider]  isosorbide mononitrate (IMDUR) 30 MG 24 hr tablet Take 15 mg by mouth daily.  03/26/15  Yes [provider]    LORazepam (ATIVAN) 1 MG tablet Take 1 mg by mouth 2 (two) times daily.  11/14/12  Yes [provider]  mometasone (NASONEX) 50 MCG/ACT nasal spray PLACE 2 SPRAYS INTO THE NOSE DAILY. 11/17/18 11/17/19 Yes Kasa, Maretta Bees, MD  montelukast (SINGULAIR) 10 MG tablet Take 10 mg by mouth at bedtime.   Yes [provider]  pantoprazole (PROTONIX) 20 MG tablet Take 20 mg by mouth 2 (two) times daily.  11/17/12  Yes [provider]  PROAIR HFA 108 (90 Base) MCG/ACT inhaler Inhale 2 puffs into the lungs every 6 (six) hours as needed. 03/26/16  Yes [provider]  propranolol (INDERAL) 10 MG tablet Take 10 mg by mouth 2 (two) times daily.  11/07/17 12/13/18 Yes [provider]  rosuvastatin (CRESTOR) 10 MG tablet Take 10 mg by mouth at bedtime.  10/16/12  Yes [provider]  SYMBICORT 160-4.5 MCG/ACT inhaler TAKE 2 PUFFS BY MOUTH TWICE A DAY 02/24/18  Yes Kasa, Maretta Bees, MD  ticagrelor (BRILINTA) 60 MG TABS tablet Take 60 mg by mouth 2 (two) times daily.  11/06/12  Yes [provider]  Tiotropium Bromide Monohydrate (SPIRIVA RESPIMAT) 1.25 MCG/ACT AERS Inhale 2 puffs into the lungs daily. 06/06/18  Yes Kasa, Maretta Bees, MD  NITROSTAT 0.4 MG SL tablet Place 0.4 mg under the tongue every 5 (five) minutes as needed for chest pain.  11/03/12   [provider]      VITAL SIGNS:  Blood pressure 115/81, pulse (!) 113, temperature 99.3 F (37.4 C), temperature source Oral, resp. rate 20, height 5' 5"  (1.651 m), weight 107 kg, SpO2 94 %.  PHYSICAL EXAMINATION:  Physical Exam  GENERAL: Acutely ill 59 y.o.-year-old Caucasian female patient lying in the bed with no acute distress.  EYES: Pupils equal, round, reactive to light and accommodation. No scleral icterus. Extraocular muscles intact.  HEENT: Head atraumatic, normocephalic. Oropharynx and nasopharynx clear.  NECK:  Supple, no jugular venous distention. No thyroid enlargement, no tenderness.  LUNGS:  Normal breath sounds bilaterally, no wheezing, rales,rhonchi or crepitation. No use of accessory muscles of respiration.  CARDIOVASCULAR: Regular rate and rhythm, S1, S2 normal. No murmurs, rubs, or gallops.  ABDOMEN: Soft, nondistended, nontender. Bowel sounds present. No organomegaly or mass.  She had left CVA tenderness. EXTREMITIES: No pedal edema, cyanosis, or clubbing.  NEUROLOGIC: Cranial nerves II through XII are intact. Muscle strength 5/5 in all extremities. Sensation intact. Gait not checked.  PSYCHIATRIC: The patient was fairly somnolent but arousable is alert and oriented x 3.  Normal affect and good eye contact. SKIN: No obvious rash, lesion, or ulcer.   LABORATORY PANEL:   CBC Recent Labs  Lab 12/13/18 1737  WBC 22.2*  HGB 14.7  HCT 44.6  PLT 158   ------------------------------------------------------------------------------------------------------------------  Chemistries  Recent Labs  Lab 12/13/18 1737  NA 137  K 3.3*  CL 102  CO2 19*  GLUCOSE 129*  BUN 30*  CREATININE 1.85*  CALCIUM 8.8*  AST 53*  ALT 34  ALKPHOS 127*  BILITOT 2.5*   ------------------------------------------------------------------------------------------------------------------  Cardiac Enzymes No results for input(s): TROPONINI in the last 168 hours. ------------------------------------------------------------------------------------------------------------------  RADIOLOGY:  Ct Abdomen Pelvis Wo Contrast  Result Date: 12/13/2018 CLINICAL DATA:  Altered mental status, LEFT side abdominal pain for 3 days, febrile yesterday to 103 degrees, history asthma, celiac disease, CHF, coronary artery disease post MI and coronary stenting, hypertension EXAM: CT CHEST, ABDOMEN AND PELVIS WITHOUT CONTRAST TECHNIQUE: Multidetector CT imaging of the chest, abdomen and pelvis was performed following the standard protocol without IV contrast. Sagittal and coronal MPR images reconstructed from axial  data set. Oral contrast was not administered. COMPARISON:  CT angio chest 05/21/2016, CT abdomen and pelvis 10/03/2011 FINDINGS: CT CHEST FINDINGS Cardiovascular: Aorta normal caliber without aneurysm or dissection. Atherosclerotic calcifications within coronary arteries with note of coronary stent. Heart normal size. No pericardial effusion. Mediastinum/Nodes: Base of cervical region normal appearance. Esophagus unremarkable. No thoracic adenopathy. Lungs/Pleura: Subsegmental atelectasis at minor fissure. Lungs otherwise clear. No infiltrate, pleural effusion or pneumothorax. Musculoskeletal: Osseous structures unremarkable. CT ABDOMEN PELVIS FINDINGS Hepatobiliary: Distended gallbladder 5 cm transverse. Liver unremarkable. Pancreas: Normal appearance Spleen: Normal appearance. Small splenule anterior to spleen. Adrenals/Urinary Tract: Adrenal glands normal appearance. Large cyst upper to mid RIGHT kidney  5.7 x 5.5 cm image 64. Small parenchymal calcification versus nonobstructing calculus at upper pole RIGHT kidney. LEFT hydronephrosis secondary to a 14 x 9 mm LEFT UPJ calculus. Mild LEFT perinephric edema and LEFT renal enlargement. Additional tiny nonobstructing renal calculi. Ureters decompressed without calcification. Bladder unremarkable. Stomach/Bowel: Normal appendix. Small to moderate-sized hiatal hernia. Remainder of stomach and bowel loops unremarkable. Vascular/Lymphatic: Atherosclerotic calcifications aorta and iliac arteries without aneurysm. No adenopathy. Reproductive: Atrophic uterus and ovaries Other: No free air or free fluid. No hernia or acute inflammatory process otherwise seen. Musculoskeletal: Osseous structures unremarkable. IMPRESSION: LEFT hydronephrosis secondary to a 14 x 9 mm LEFT UPJ calculus. Additional tiny nonobstructing renal calculi bilaterally. Small to moderate-sized hiatal hernia. Coronary artery calcifications with note of coronary stent. Distended gallbladder. Aortic  Atherosclerosis (ICD10-I70.0). Electronically Signed   By: Lavonia Dana M.D.   On: 12/13/2018 19:05   Ct Chest Wo Contrast  Result Date: 12/13/2018 CLINICAL DATA:  Altered mental status, LEFT side abdominal pain for 3 days, febrile yesterday to 103 degrees, history asthma, celiac disease, CHF, coronary artery disease post MI and coronary stenting, hypertension EXAM: CT CHEST, ABDOMEN AND PELVIS WITHOUT CONTRAST TECHNIQUE: Multidetector CT imaging of the chest, abdomen and pelvis was performed following the standard protocol without IV contrast. Sagittal and coronal MPR images reconstructed from axial data set. Oral contrast was not administered. COMPARISON:  CT angio chest 05/21/2016, CT abdomen and pelvis 10/03/2011 FINDINGS: CT CHEST FINDINGS Cardiovascular: Aorta normal caliber without aneurysm or dissection. Atherosclerotic calcifications within coronary arteries with note of coronary stent. Heart normal size. No pericardial effusion. Mediastinum/Nodes: Base of cervical region normal appearance. Esophagus unremarkable. No thoracic adenopathy. Lungs/Pleura: Subsegmental atelectasis at minor fissure. Lungs otherwise clear. No infiltrate, pleural effusion or pneumothorax. Musculoskeletal: Osseous structures unremarkable. CT ABDOMEN PELVIS FINDINGS Hepatobiliary: Distended gallbladder 5 cm transverse. Liver unremarkable. Pancreas: Normal appearance Spleen: Normal appearance. Small splenule anterior to spleen. Adrenals/Urinary Tract: Adrenal glands normal appearance. Large cyst upper to mid RIGHT kidney 5.7 x 5.5 cm image 64. Small parenchymal calcification versus nonobstructing calculus at upper pole RIGHT kidney. LEFT hydronephrosis secondary to a 14 x 9 mm LEFT UPJ calculus. Mild LEFT perinephric edema and LEFT renal enlargement. Additional tiny nonobstructing renal calculi. Ureters decompressed without calcification. Bladder unremarkable. Stomach/Bowel: Normal appendix. Small to moderate-sized hiatal hernia.  Remainder of stomach and bowel loops unremarkable. Vascular/Lymphatic: Atherosclerotic calcifications aorta and iliac arteries without aneurysm. No adenopathy. Reproductive: Atrophic uterus and ovaries Other: No free air or free fluid. No hernia or acute inflammatory process otherwise seen. Musculoskeletal: Osseous structures unremarkable. IMPRESSION: LEFT hydronephrosis secondary to a 14 x 9 mm LEFT UPJ calculus. Additional tiny nonobstructing renal calculi bilaterally. Small to moderate-sized hiatal hernia. Coronary artery calcifications with note of coronary stent. Distended gallbladder. Aortic Atherosclerosis (ICD10-I70.0). Electronically Signed   By: Lavonia Dana M.D.   On: 12/13/2018 19:05   Dg Chest Port 1 View  Result Date: 12/13/2018 CLINICAL DATA:  Altered mental status. EXAM: PORTABLE CHEST 1 VIEW COMPARISON:  06/06/2018 FINDINGS: Lung volumes are low with linear opacities at the lung bases bilaterally. No signs of dense consolidation or evidence of pleural effusion. No acute bone finding. IMPRESSION: Low lung volumes with basilar atelectasis. No consolidation or pleural effusion. Electronically Signed   By: Zetta Bills M.D.   On: 12/13/2018 18:33   Dg Or Urology Cysto Image ([facility] Only)  Result Date: 12/13/2018 There is no interpretation for this exam.  This order is for images obtained during a surgical procedure.  Please See "Surgeries" Tab for more information regarding the procedure.   US Abdomen Limited Ruq  Result Date: 12/13/2018 CLINICAL DATA:  Abdominal pain, sepsis, and elevated LFTs. EXAM: ULTRASOUND ABDOMEN LIMITED RIGHT UPPER QUADRANT COMPARISON:  None. FINDINGS: Gallbladder: The gallbladder is somewhat distended measuring up to 5.8 cm. No wall thickening, stones, sludge, or Murphy's sign. No pericholecystic fluid. Common bile duct: Diameter: 5.4 mm Liver: No focal lesion identified. Within normal limits in parenchymal echogenicity. Portal vein is patent on color Doppler imaging  with normal direction of blood flow towards the liver. Other: None. IMPRESSION: 1. The gallbladder is distended to 5.8 cm with no stones, wall thickening, or Murphy's sign identified. If there is concern for acalculous cholecystitis, a HIDA scan could be performed. 2. Probable hepatic steatosis. Electronically Signed   By: Dorise Bullion III M.D   On: 12/13/2018 20:45      IMPRESSION AND PLAN:   1.  Sepsis with severe sepsis and developing septic shock requiring brief IV pressor therapy, secondary to UTI.  She had subsequent metabolic encephalopathy.  The patient will be admitted to an ICU bed.  We will continue antibiotic therapy with continued broad-spectrum coverage with IV cefepime and vancomycin.  We will continue aggressive hydration with IV normal saline.  We will follow urine and blood cultures.  I have discussed this case with the ICU team.  2.  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 Jeffie Pollock.  The patient will be hydrated with IV normal saline and will continue monitoring BMP.    3.  Asthma.  We will continue her inhalers including Symbicort and Spiriva and place her on as needed duo nebs as well as continue her Singulair.  4.  Dyslipidemia.  We will continue statin therapy.  5.  Coronary artery disease status post PCI and stent.  We will continue her aspirin, Brilinta and Imdur.  Inderal will be continued with improved blood pressure.  6.  Anxiety.  We will continue on Ativan.  7.  DVT prophylaxis.  Subcutaneous Lovenox   All the records are reviewed and case discussed with ED provider. The plan of care was discussed in details with the patient (and family). I answered all questions. The patient agreed to proceed with the above mentioned plan. Further management will depend upon hospital course.   CODE STATUS: Full code  TOTAL CRITICAL CARE TIME TAKING CARE OF THIS  PATIENT: 55 minutes.    Christel Mormon M.D on 12/13/2018 at 10:35 PM  [facility]   From 7 PM-7 AM, contact night-coverage [email or web address]  CC: Primary care physician; Albina Billet, MD   Note: This dictation was prepared with Dragon dictation along with smaller phrase technology. Any transcriptional errors that result from this process are unintentional.

## 2018-12-13 NOTE — Progress Notes (Signed)
CODE SEPSIS - PHARMACY COMMUNICATION  **Broad Spectrum Antibiotics should be administered within 1 hour of Sepsis diagnosis**  Time Code Sepsis Called/Page Received:   12/5 @ 1741   Antibiotics Ordered:   Cefepime , Vancomycin   Time of 1st antibiotic administration:   Cefepime @ 1756   Additional action taken by pharmacy:   If necessary, Name of Provider/Nurse Contacted:     Attikus Bartoszek D ,PharmD Clinical Pharmacist  12/13/2018  6:21 PM

## 2018-12-13 NOTE — Transfer of Care (Signed)
Immediate Anesthesia Transfer of Care Note  Patient: Brenda Craig  Procedure(s) Performed: CYSTOSCOPY WITH RETROGRADE PYELOGRAM/URETERAL STENT PLACEMENT (Left )  Patient Location: PACU  Anesthesia Type:General  Level of Consciousness: awake and confused  Airway & Oxygen Therapy: Patient Spontanous Breathing and Patient connected to nasal cannula oxygen  Post-op Assessment: Report given to RN, Post -op Vital signs reviewed and stable and Patient moving all extremities  Post vital signs: Reviewed and stable  Last Vitals:  Vitals Value Taken Time  BP 154/122 12/13/18 2302  Temp 36.9 C 12/13/18 2247  Pulse 135 12/13/18 2303  Resp 29 12/13/18 2303  SpO2 88 % 12/13/18 2303  Vitals shown include unvalidated device data.  Last Pain:  Vitals:   12/13/18 2247  TempSrc:   PainSc: 0-No pain         Complications: No apparent anesthesia complications

## 2018-12-13 NOTE — Anesthesia Procedure Notes (Signed)
Procedure Name: Intubation Date/Time: 12/13/2018 10:03 PM Performed by: Esaw Grandchild, CRNA Pre-anesthesia Checklist: Patient identified, Emergency Drugs available, Suction available and Patient being monitored Patient Re-evaluated:Patient Re-evaluated prior to induction Oxygen Delivery Method: Circle system utilized Preoxygenation: Pre-oxygenation with 100% oxygen Induction Type: IV induction Ventilation: Mask ventilation without difficulty Laryngoscope Size: McGraph and 3 Grade View: Grade I Tube type: Oral Tube size: 7.0 mm Number of attempts: 1 Airway Equipment and Method: Stylet and Oral airway Placement Confirmation: ETT inserted through vocal cords under direct vision,  positive ETCO2 and breath sounds checked- equal and bilateral Secured at: 21 cm Tube secured with: Tape Dental Injury: Teeth and Oropharynx as per pre-operative assessment

## 2018-12-13 NOTE — Anesthesia Preprocedure Evaluation (Addendum)
Anesthesia Evaluation  Patient identified by MRN, date of birth, ID band Patient awake    Reviewed: Allergy & Precautions, NPO status , Patient's Chart, lab work & pertinent test results  History of Anesthesia Complications Negative for: history of anesthetic complications  Airway Mallampati: III     Mouth opening: Limited Mouth Opening  Dental  (+) Chipped, Poor Dentition   Pulmonary asthma , sleep apnea and Continuous Positive Airway Pressure Ventilation , Not current smoker,           Cardiovascular hypertension, + CAD, + Past MI and + Cardiac Stents  (-) CHF + dysrhythmias (tachycardia) (-) Valvular Problems/Murmurs     Neuro/Psych neg Seizures Anxiety    GI/Hepatic Neg liver ROS, hiatal hernia, PUD, GERD  Medicated and Poorly Controlled,  Endo/Other  neg diabetes  Renal/GU Renal disease (stones)     Musculoskeletal   Abdominal   Peds  Hematology  (+) anemia ,   Anesthesia Other Findings   Reproductive/Obstetrics                            Anesthesia Physical Anesthesia Plan  ASA: III and emergent  Anesthesia Plan: General   Post-op Pain Management:    Induction: Intravenous  PONV Risk Score and Plan: 3 and Ondansetron, Dexamethasone and Midazolam  Airway Management Planned: Oral ETT  Additional Equipment:   Intra-op Plan:   Post-operative Plan:   Informed Consent: I have reviewed the patients History and Physical, chart, labs and discussed the procedure including the risks, benefits and alternatives for the proposed anesthesia with the patient or authorized representative who has indicated his/her understanding and acceptance.       Plan Discussed with:   Anesthesia Plan Comments:         Anesthesia Quick Evaluation

## 2018-12-13 NOTE — ED Notes (Signed)
Patient voided in room commode in a hat. Patient's stretcher was moved over to the commode and patient was assisted to sit. Patient remained steady during the transfer. Patient is alert and oriented. Patient was placed on 2L O2 for sats at 90% on room air. Dr. Joni Fears informed.

## 2018-12-13 NOTE — ED Triage Notes (Signed)
Per EMS report, patient's family reported patient had altered mental status starting today. Patient c/o left-side abdominal pain x3days and yesterday was febrile at 103. Patient lives at home with her daughter.Patient is oriented to name, birthdate, got age wrong and could tell what place she was at.

## 2018-12-13 NOTE — Consult Note (Signed)
PULMONARY / CRITICAL CARE MEDICINE  Name: Brenda Craig MRN: [id number] DOB: [birth year]    LOS: 0  Referring Provider: Dr. Jeffie Pollock  Reason for Referral: Septic shock s/p cystoscopy with left retrograde pyelogram and left stent placement Brief patient description: 58 year old female admitted with left pyelonephritis and hydronephrosis, developed severe septic shock s/p cystoscopy with left stent placement.  HPI: This is a 58 year old female with a medical history as indicated below who presented to the ED with fever, altered mental status and left flank pain.  History was initially obtained from ED and EMS records but this morning patient is awake enough to update her on history.  She states that it all started November 11 when she developed a fever.  She was tested for Covid and was negative.  She was seen by her PCP and placed on antibiotics.  Once she completed antibiotics the fever resumed and was  worse.  She said her fever went as high as 103 F at home.  She became nauseous and then developed left-sided abdominal pain that progressively got worse.  Today her son who is a paramedic came by and said she needed to go to the emergency room.  At the ED she was febrile and complaining of abdominal pain and nausea.  Her ED work-up revealed a grossly abnormal urinalysis, and her CT abdomen revealed a 14 mm left kidney stone with some obstruction.  Her labs showed a creatinine of 1.85 up from her baseline of 0.87, lactic acid of 2.9, WBC of 22.2, and a procalcitonin greater than 150.  Urology was consulted and she was taken to the OR and underwent cystoscopy with left retrograde pyelogram and insertion of a left double-J stent.  Postoperatively, she became severely hypotensive requiring pressors hence she was transferred to the ICU for further management. Upon arrival in the ICU, patient remained hypotensive but became severely delirious and agitated requiring Precedex.  This morning she is more awake  and lucid.  She reports persistent mild left flank pain but denies any nausea.  She states that she usually gets hypotensive postoperatively. She has a history of sleep apnea and uses CPAP at home.  Past Medical History:  Diagnosis Date  . Abnormal Pap smear of cervix 1988  . Acid reflux 01/17/2013  . Allergic rhinitis   . Anemia   . Anxiety   . Asthma   . Celiac disease   . Cervical dysplasia 1988  . CHF (congestive heart failure) ([place])   . Coronary artery disease   . Heart trouble   . History of cardiac arrest   . History of mammogram 03/25/2013; 12/07/14   BIRADS 1; NEG  . History of Papanicolaou smear of cervix 03/20/12; 12/28/15   -/-; -/-  . Hypercholesteremia   . Hypertension   . Menopausal symptoms   . MI (myocardial infarction) ([place])   . Mitral valve prolapse   . Sleep apnea   . Vulvovaginitis    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   Past Surgical History:  Procedure Laterality Date  . ABLATION  2013   [facility]  . BACK SURGERY    . BACK SURGERY  2016; 2017   L4, L5  . CARDIAC SURGERY  03/19/12; 10/2014   HEART CATH AND STENT  . [place]; 1989  . COLD KNIFE CONE BIOPSY  1988  . COLONOSCOPY  08/2010   16 POLYPS (ALL BENIGN) DX C CELIAC DISEASE  . COMBINED HYSTEROSCOPY  DIAGNOSTIC / D&C  2013   [facility]  . DILATION AND CURETTAGE OF UTERUS  2001  . ESOPHAGOGASTRODUODENOSCOPY  08/2010   DX C CELIAC DISEASE  . HEART STENTS  05/27/2011  . HIP SURGERY  2016   RIGHT IT BAND AND BURSA REMOVAL  . OOPHORECTOMY Right 1982   No current facility-administered medications on file prior to encounter.    Current Outpatient Medications on File Prior to Encounter  Medication Sig  . albuterol (PROVENTIL) (2.5 MG/3ML) 0.083% nebulizer solution Take 3 mLs (2.5 mg total) by nebulization every 6 (six) hours as needed for wheezing or shortness of breath.  Marland Kitchen amLODipine (NORVASC) 5 MG tablet Take 1 tablet (5 mg total) by mouth daily.  Marland Kitchen azelastine  (ASTELIN) 0.1 % nasal spray Place 2 sprays into both nostrils 2 (two) times daily.  . budesonide (PULMICORT) 0.5 MG/2ML nebulizer solution Take 2 mLs (0.5 mg total) by nebulization 2 (two) times daily.  . cetirizine (ZYRTEC) 10 MG tablet TAKE 1 TABLET BY MOUTH EVERY DAY  . chlorpheniramine-HYDROcodone (TUSSIONEX) 10-8 MG/5ML SUER TAKE 5 MILLILITERS BY ORAL ROUTE EVERY 12 HOURS  . [facility] ASPIRIN ADULT LOW DOSE 81 MG chewable tablet Chew 81 mg by mouth daily.  . isosorbide mononitrate (IMDUR) 30 MG 24 hr tablet Take 15 mg by mouth daily.   Marland Kitchen LORazepam (ATIVAN) 1 MG tablet Take 1 mg by mouth 2 (two) times daily.   . mometasone (NASONEX) 50 MCG/ACT nasal spray PLACE 2 SPRAYS INTO THE NOSE DAILY.  . montelukast (SINGULAIR) 10 MG tablet Take 10 mg by mouth at bedtime.  . pantoprazole (PROTONIX) 20 MG tablet Take 20 mg by mouth 2 (two) times daily.   Marland Kitchen PROAIR HFA 108 (90 Base) MCG/ACT inhaler Inhale 2 puffs into the lungs every 6 (six) hours as needed.  . propranolol (INDERAL) 10 MG tablet Take 10 mg by mouth 2 (two) times daily.   . rosuvastatin (CRESTOR) 10 MG tablet Take 10 mg by mouth at bedtime.   . SYMBICORT 160-4.5 MCG/ACT inhaler TAKE 2 PUFFS BY MOUTH TWICE A DAY  . ticagrelor (BRILINTA) 60 MG TABS tablet Take 60 mg by mouth 2 (two) times daily.   . Tiotropium Bromide Monohydrate (SPIRIVA RESPIMAT) 1.25 MCG/ACT AERS Inhale 2 puffs into the lungs daily.  Marland Kitchen NITROSTAT 0.4 MG SL tablet Place 0.4 mg under the tongue every 5 (five) minutes as needed for chest pain.     Allergies Allergies  Allergen Reactions  . Codeine Nausea And Vomiting and Rash  . Nexium [Esomeprazole Magnesium] Palpitations  . Omeprazole Magnesium Palpitations  . Oxycodone-Acetaminophen Itching, Nausea And Vomiting and Rash  . Percocet [Oxycodone-Acetaminophen] Itching, Nausea And Vomiting and Rash    Family History Family History  Problem Relation Age of Onset  . CAD Father   . Transient ischemic attack Father   .  Diabetes Father   . Cerebrovascular Accident Father   . Heart disease Father        M-MVP; F BETA;BLOCKERS  . Hypothyroidism Father   . Hyperthyroidism Mother   . Cancer Maternal Uncle        KIDNEY  . Uterine cancer Maternal Aunt 69   Social History  reports that she has never smoked. She has never used smokeless tobacco. She reports that she does not drink alcohol or use drugs.  Review Of Systems: Unable to obtain initially as patient was agitated and confused and unable to provide any history.  12/14/2018 morning, patient became lucid and was able to  provide the following review of systems:  Constitutional: Positive for fever and chills that have now resolved.  HENT: Negative for congestion and rhinorrhea.  Eyes: Negative for redness and visual disturbance.  Respiratory: Negative for shortness of breath and wheezing.  Cardiovascular: Negative for chest pain and palpitations.  Gastrointestinal: Positive for nausea , vomiting that have resolved but mild abdominal pain persists Genitourinary: Negative for dysuria and urgency.  Endocrine: Denies polyuria, polyphagia and heat intolerance Musculoskeletal: Negative for myalgias and arthralgias.  Skin: Negative for pallor and wound.  Neurological: Negative for dizziness and headaches but reports generalized weakness  VITAL SIGNS: BP 118/77 (BP Location: Right Arm)   Pulse 98   Temp 100 F (37.8 C)   Resp (!) 25   Ht 5\' 5"  (1.651 m)   Wt 107 kg   SpO2 92%   BMI 39.27 kg/m   HEMODYNAMICS:    VENTILATOR SETTINGS:    INTAKE / OUTPUT: I/O last 3 completed shifts: In: 100 [IV Piggyback:100] Out: -   PHYSICAL EXAMINATION: General: Acutely ill looking, in moderate distress HEENT: PERRLA, trachea midline, no JVD Neuro: Alert and oriented to person only, speech is garbled, moves all extremities Cardiovascular: Pickup pulse tachycardic, S1-S2, no murmur regurg or gallop, Lungs: Normal work of breathing, bilateral breath sounds  without any wheezes or rhonchi Abdomen: Obese, normal bowel sounds in all 4 quadrants, palpation reveals no organomegaly Musculoskeletal: Positive range of motion, no joint deformities Skin: Warm and dry  LABS:  BMET Recent Labs  Lab 12/13/18 1737  NA 137  K 3.3*  CL 102  CO2 19*  BUN 30*  CREATININE 1.85*  GLUCOSE 129*    Electrolytes Recent Labs  Lab 12/13/18 1737  CALCIUM 8.8*    CBC Recent Labs  Lab 12/13/18 1737  WBC 22.2*  HGB 14.7  HCT 44.6  PLT 158    Coag's Recent Labs  Lab 12/13/18 1737  APTT 51*  INR 1.2    Sepsis Markers Recent Labs  Lab 12/13/18 1737 12/13/18 1933 12/13/18 2313  LATICACIDVEN 2.9* 3.2* 3.4*  PROCALCITON >150.00  --   --     ABG No results for input(s): PHART, PCO2ART, PO2ART in the last 168 hours.  Liver Enzymes Recent Labs  Lab 12/13/18 1737  AST 53*  ALT 34  ALKPHOS 127*  BILITOT 2.5*  ALBUMIN 3.6    Cardiac Enzymes No results for input(s): TROPONINI, PROBNP in the last 168 hours.  Glucose Recent Labs  Lab 12/13/18 2336  GLUCAP 117*    Imaging Ct Abdomen Pelvis Wo Contrast  Result Date: 12/13/2018 CLINICAL DATA:  Altered mental status, LEFT side abdominal pain for 3 days, febrile yesterday to 103 degrees, history asthma, celiac disease, CHF, coronary artery disease post MI and coronary stenting, hypertension EXAM: CT CHEST, ABDOMEN AND PELVIS WITHOUT CONTRAST TECHNIQUE: Multidetector CT imaging of the chest, abdomen and pelvis was performed following the standard protocol without IV contrast. Sagittal and coronal MPR images reconstructed from axial data set. Oral contrast was not administered. COMPARISON:  CT angio chest 05/21/2016, CT abdomen and pelvis 10/03/2011 FINDINGS: CT CHEST FINDINGS Cardiovascular: Aorta normal caliber without aneurysm or dissection. Atherosclerotic calcifications within coronary arteries with note of coronary stent. Heart normal size. No pericardial effusion. Mediastinum/Nodes:  Base of cervical region normal appearance. Esophagus unremarkable. No thoracic adenopathy. Lungs/Pleura: Subsegmental atelectasis at minor fissure. Lungs otherwise clear. No infiltrate, pleural effusion or pneumothorax. Musculoskeletal: Osseous structures unremarkable. CT ABDOMEN PELVIS FINDINGS Hepatobiliary: Distended gallbladder 5 cm transverse. Liver unremarkable.  Pancreas: Normal appearance Spleen: Normal appearance. Small splenule anterior to spleen. Adrenals/Urinary Tract: Adrenal glands normal appearance. Large cyst upper to mid RIGHT kidney 5.7 x 5.5 cm image 64. Small parenchymal calcification versus nonobstructing calculus at upper pole RIGHT kidney. LEFT hydronephrosis secondary to a 14 x 9 mm LEFT UPJ calculus. Mild LEFT perinephric edema and LEFT renal enlargement. Additional tiny nonobstructing renal calculi. Ureters decompressed without calcification. Bladder unremarkable. Stomach/Bowel: Normal appendix. Small to moderate-sized hiatal hernia. Remainder of stomach and bowel loops unremarkable. Vascular/Lymphatic: Atherosclerotic calcifications aorta and iliac arteries without aneurysm. No adenopathy. Reproductive: Atrophic uterus and ovaries Other: No free air or free fluid. No hernia or acute inflammatory process otherwise seen. Musculoskeletal: Osseous structures unremarkable. IMPRESSION: LEFT hydronephrosis secondary to a 14 x 9 mm LEFT UPJ calculus. Additional tiny nonobstructing renal calculi bilaterally. Small to moderate-sized hiatal hernia. Coronary artery calcifications with note of coronary stent. Distended gallbladder. Aortic Atherosclerosis (ICD10-I70.0). Electronically Signed   By: Lavonia Dana M.D.   On: 12/13/2018 19:05   Ct Chest Wo Contrast  Result Date: 12/13/2018 CLINICAL DATA:  Altered mental status, LEFT side abdominal pain for 3 days, febrile yesterday to 103 degrees, history asthma, celiac disease, CHF, coronary artery disease post MI and coronary stenting, hypertension EXAM:  CT CHEST, ABDOMEN AND PELVIS WITHOUT CONTRAST TECHNIQUE: Multidetector CT imaging of the chest, abdomen and pelvis was performed following the standard protocol without IV contrast. Sagittal and coronal MPR images reconstructed from axial data set. Oral contrast was not administered. COMPARISON:  CT angio chest 05/21/2016, CT abdomen and pelvis 10/03/2011 FINDINGS: CT CHEST FINDINGS Cardiovascular: Aorta normal caliber without aneurysm or dissection. Atherosclerotic calcifications within coronary arteries with note of coronary stent. Heart normal size. No pericardial effusion. Mediastinum/Nodes: Base of cervical region normal appearance. Esophagus unremarkable. No thoracic adenopathy. Lungs/Pleura: Subsegmental atelectasis at minor fissure. Lungs otherwise clear. No infiltrate, pleural effusion or pneumothorax. Musculoskeletal: Osseous structures unremarkable. CT ABDOMEN PELVIS FINDINGS Hepatobiliary: Distended gallbladder 5 cm transverse. Liver unremarkable. Pancreas: Normal appearance Spleen: Normal appearance. Small splenule anterior to spleen. Adrenals/Urinary Tract: Adrenal glands normal appearance. Large cyst upper to mid RIGHT kidney 5.7 x 5.5 cm image 64. Small parenchymal calcification versus nonobstructing calculus at upper pole RIGHT kidney. LEFT hydronephrosis secondary to a 14 x 9 mm LEFT UPJ calculus. Mild LEFT perinephric edema and LEFT renal enlargement. Additional tiny nonobstructing renal calculi. Ureters decompressed without calcification. Bladder unremarkable. Stomach/Bowel: Normal appendix. Small to moderate-sized hiatal hernia. Remainder of stomach and bowel loops unremarkable. Vascular/Lymphatic: Atherosclerotic calcifications aorta and iliac arteries without aneurysm. No adenopathy. Reproductive: Atrophic uterus and ovaries Other: No free air or free fluid. No hernia or acute inflammatory process otherwise seen. Musculoskeletal: Osseous structures unremarkable. IMPRESSION: LEFT hydronephrosis  secondary to a 14 x 9 mm LEFT UPJ calculus. Additional tiny nonobstructing renal calculi bilaterally. Small to moderate-sized hiatal hernia. Coronary artery calcifications with note of coronary stent. Distended gallbladder. Aortic Atherosclerosis (ICD10-I70.0). Electronically Signed   By: Lavonia Dana M.D.   On: 12/13/2018 19:05   Dg Chest Port 1 View  Result Date: 12/13/2018 CLINICAL DATA:  Altered mental status. EXAM: PORTABLE CHEST 1 VIEW COMPARISON:  06/06/2018 FINDINGS: Lung volumes are low with linear opacities at the lung bases bilaterally. No signs of dense consolidation or evidence of pleural effusion. No acute bone finding. IMPRESSION: Low lung volumes with basilar atelectasis. No consolidation or pleural effusion. Electronically Signed   By: Zetta Bills M.D.   On: 12/13/2018 18:33   Dg Or Urology Cysto Image ([facility]  Only)  Result Date: 12/13/2018 There is no interpretation for this exam.  This order is for images obtained during a surgical procedure.  Please See "Surgeries" Tab for more information regarding the procedure.   US Abdomen Limited Ruq  Result Date: 12/13/2018 CLINICAL DATA:  Abdominal pain, sepsis, and elevated LFTs. EXAM: ULTRASOUND ABDOMEN LIMITED RIGHT UPPER QUADRANT COMPARISON:  None. FINDINGS: Gallbladder: The gallbladder is somewhat distended measuring up to 5.8 cm. No wall thickening, stones, sludge, or Murphy's sign. No pericholecystic fluid. Common bile duct: Diameter: 5.4 mm Liver: No focal lesion identified. Within normal limits in parenchymal echogenicity. Portal vein is patent on color Doppler imaging with normal direction of blood flow towards the liver. Other: None. IMPRESSION: 1. The gallbladder is distended to 5.8 cm with no stones, wall thickening, or Murphy's sign identified. If there is concern for acalculous cholecystitis, a HIDA scan could be performed. 2. Probable hepatic steatosis. Electronically Signed   By: Dorise Bullion III M.D   On: 12/13/2018 20:45    STUDIES:  None  CULTURES: Blood cultures x2 Urine cultures  ANTIBIOTICS: Cefepime discontinued Started on meropenem for both aerobic and anaerobic coverage 12/14/2018>  SIGNIFICANT EVENTS: 12/13/2018: Admitted  LINES/TUBES: Peripheral IVs Foley catheter  DISCUSSION: 58 year old female presenting with acute pyelonephritis, hydronephrosis, urosepsis and septic shock s/p left cystoscopy with stent placement  ASSESSMENT / PLAN:  PULMONARY A: Sleep apnea Asthma P:   Mandatory nocturnal CPAP Nebulized bronchodilators and steroids  CARDIOVASCULAR A:  Septic shock History of MI, hypertension, hyperlipidemia, cardiac arrest, CHF and CAD P:  Hemodynamic monitoring per ICU protocol Gentle IV hydration given history of CHF Neo-Synephrine infusion to maintain mean arterial blood pressure greater than 65 Consider repeat echocardiogram for refractory shock Continue home dose of Brilinta once cleared by urology Hold antihypertensives in light of shock  RENAL A:   Left obstructive uropathy with hydronephrosis s/p cystoscopy with left retrograde pyelogram and stent placement UTI Acute renal failure P:   Antibiotics as above Gentle IV hydration Trend renal indices Urology following Monitor I's and O's Monitor and correct electrolytes  GASTROINTESTINAL A:   Nausea and vomiting-resolved P:   Continue as needed antiemetics  INFECTIOUS A:   UTI Sepsis secondary to UTI P:   Follow-up blood and urine cultures Antibiotics as above  NEUROLOGIC A:   Acute delirium-likely due to sepsis  P:   Precedex infusion and titrate to comfort  Best Practice: Code Status: Full code Diet: NPO until mentation improves GI prophylaxis: not indicated VTE prophylaxis:  SCDs/Lovenox  FAMILY  - Updates: Family updated by urologist. Will update with any changes in treatment plan  Total critical care time 60 minutes  Raynald Rouillard S. Tukov-Yual, ANP-BC [facility] [place] Pager [phone] or [phone]  NB: This document was prepared using Dragon voice recognition software and may include unintentional dictation errors.    12/13/2018, 11:50 PM

## 2018-12-13 NOTE — Progress Notes (Signed)
Notified provider of need to order repeat lactate since second lactate has increased.

## 2018-12-13 NOTE — Consult Note (Addendum)
Pharmacy Antibiotic Note  Brenda Craig is a 58 y.o. female admitted on 12/13/2018 with Sepsis secondary to UTI.  Patient is S/P  cystoscopy ureteroscopy and left ureteral stent. Pharmacy has been consulted for ceftriaxone dosing.  PCT >150   WBC: 22.2  Tmax: 100  Lactate 3.2   Plan: Start Ceftriaxone 1g IV every 24 hours   Height: 5\' 5"  (165.1 cm) Weight: 236 lb (107 kg) IBW/kg (Calculated) : 57  Temp (24hrs), Avg:99.3 F (37.4 C), Min:98.5 F (36.9 C), Max:100 F (37.8 C)  Recent Labs  Lab 12/13/18 1737 12/13/18 1933  WBC 22.2*  --   CREATININE 1.85*  --   LATICACIDVEN 2.9* 3.2*    Estimated Creatinine Clearance: 39.8 mL/min (A) (by C-G formula based on SCr of 1.85 mg/dL (H)).    Allergies  Allergen Reactions  . Codeine Nausea And Vomiting and Rash  . Nexium [Esomeprazole Magnesium] Palpitations  . Omeprazole Magnesium Palpitations  . Oxycodone-Acetaminophen Itching, Nausea And Vomiting and Rash  . Percocet [Oxycodone-Acetaminophen] Itching, Nausea And Vomiting and Rash    Antimicrobials this admission: 12/5 Vancomycin x 1 12/5 Cefepime>> x1 12/6 Ceftriaxone >>   Microbiology results: 12/5  BCx: pending 12/5 UCx: pending  12/5 MRSA PCR: pending 12/5 SARC Coronavirus 2: negative   Thank you for allowing pharmacy to be a part of this patient's care.  Pernell Dupre, PharmD, BCPS Clinical Pharmacist 12/13/2018 11:44 PM

## 2018-12-13 NOTE — Op Note (Signed)
Procedure: 1.  Cystoscopy with left retrograde pyelogram and interpretation. 2.  Cystoscopy with insertion of left double-J stent.  Preop diagnosis: 9 x 14 mm left UPJ stone with obstruction and sepsis.  Postop diagnosis: 9 x 14 mm stone in the left upper pole infundibulum with obstruction of the upper pole and sepsis.  Surgeon: Dr. Irine Seal.  Anesthesia: General.  Specimen: Urine culture.  Drains: 15 French Foley catheter and 6 [place] by 26 cm left contour double-J stent.  EBL: None.  Complications: None.  Indications: The patient is a 58 year old female who brought to the emergency room today with mental status changes and fever to 103.  She had left abdominal pain that began approximately 3 weeks before with flulike symptoms and failed to resolve with a 10-day course of antibiotics.  A CT scan in the emergency room demonstrated a 14 x 9 mm stone that was felt to be of the left UPJ.  She is septic with an elevated lactate and AKI.  She is on Brilinta and aspirin so it was felt that cystoscopy and ureteral stenting was most appropriate option for relief of the obstruction.  Procedure: He was taken operating room where general anesthetic was induced.  She had been given vancomycin and cefepime and metronidazole in the emergency room.  She was placed in the lithotomy position.  Her perineum and genitalia were prepped with Betadine solution and she was draped in the usual sterile fashion.  Cystoscopy was performed using a 23 [place] scope and 30 degree lens.  Examination revealed a normal urethra.  The bladder wall was smooth and pale without tumors or stones but there was some erythema consistent with inflammation of the trigone.  The urine was quite turbid and a specimen was obtained upon initial drainage but after irrigation the bladder was unremarkable.  The left ureteral orifice was cannulated with a 5 [place] open-ended catheter and contrast was instilled.  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  The stone was obstructing an upper pole infundibulum and contrast did not reflux proximally to the stone.  An angled tipped sensor guidewire was then passed through the opening catheter and originally went into the lower calyceal system but with some negotiation I was able to get it into the upper infundibulum and by the stone however the open-ended catheter would not pass the stone.  There was initial efflux of some toothpaste like material followed by purulent urine alongside the wire.  A 6 French by 26 cm contour double-J stent was then advanced over the wire under fluoroscopic guidance and while there was some resistance at the level of the stone I was able to negotiate the tip and a portion of the coil proximal to the stone into the upper calyceal system.  Upon removal of the wire a partial coil found in the upper calyceal system and a good coil formed in the bladder.  The cystoscope was removed and a 16 French Foley catheter was inserted.  The balloon was filled with 10 mL of sterile fluid.  Her anesthetic was reversed and she was moved to recovery room in guarded condition.  There were no complications.

## 2018-12-13 NOTE — Consult Note (Signed)
Subjective: CC: Left abdominal pain.  Hx:  The patient is a 58 yo female who presented to the ER with left abdominal pain.   I was asked to see her in consultation by Dr. Joni Fears as she was found the have a 39m left UPJ stone with some obstruction and sepsis with a fever to 103 and a lactate of 3 with AKI.   She is on Brilinta and ASA for cardiac issues so a perc is not possible at this time.   She reports initial symptoms on 11/10 with flu like symptoms and left abdominal pain.  She was given a 10 day course of antibiotics with some benefit but she didn't fell well enough to go back to the doctor for reevaluation.  She has had prior UTI's and stones.  The stones were remotely.  She doesn't report prior GU surgery.   ROS:  Review of Systems  Constitutional: Positive for chills, fever and malaise/fatigue.  Respiratory: Negative for shortness of breath.   Cardiovascular: Negative for chest pain and leg swelling.  Gastrointestinal: Positive for abdominal pain, nausea and vomiting.  Genitourinary: Negative for dysuria, frequency and hematuria.  All other systems reviewed and are negative.   Allergies  Allergen Reactions  . Codeine Nausea And Vomiting and Rash  . Nexium [Esomeprazole Magnesium] Palpitations  . Omeprazole Magnesium Palpitations  . Oxycodone-Acetaminophen Itching, Nausea And Vomiting and Rash  . Percocet [Oxycodone-Acetaminophen] Itching, Nausea And Vomiting and Rash    Past Medical History:  Diagnosis Date  . Abnormal Pap smear of cervix 1988  . Acid reflux 01/17/2013  . Allergic rhinitis   . Anemia   . Anxiety   . Asthma   . Celiac disease   . Cervical dysplasia 1988  . [place]   . Coronary artery disease   . Heart trouble   . History of cardiac arrest   . History of mammogram 03/25/2013; 12/07/14   BIRADS 1; NEG  . History of Papanicolaou smear of cervix 03/20/12; 12/28/15   -/-; -/-  . Hypercholesteremia   . Hypertension   .  Menopausal symptoms   . [place]   . Mitral valve prolapse   . Sleep apnea   . Vulvovaginitis    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    Past Surgical History:  Procedure Laterality Date  . ABLATION  2013   [facility]  . BACK SURGERY    . BACK SURGERY  2016; 2017   L4, L5  . CARDIAC SURGERY  03/19/12; 10/2014   HEART CATH AND STENT  . [place] 1989  . COLD KNIFE CONE BIOPSY  1988  . COLONOSCOPY  08/2010   16 POLYPS (ALL BENIGN) DX C CELIAC DISEASE  . COMBINED HYSTEROSCOPY DIAGNOSTIC / D&C  2013   [facility]  . DILATION AND CURETTAGE OF UTERUS  2001  . ESOPHAGOGASTRODUODENOSCOPY  08/2010   DX C CELIAC DISEASE  . HEART STENTS  05/27/2011  . HIP SURGERY  2016   RIGHT IT BAND AND BURSA REMOVAL  . OOPHORECTOMY Right 1982    Social History   Socioeconomic History  . Marital status: Divorced    Spouse name: Not on file  . Number of children: 2  . Years of education: 168 . Highest education level: Not on file  Occupational History  . Occupation: TEACHER  Social Needs  . Financial resource strain: Not on file  . Food insecurity  Worry: Not on file    Inability: Not on file  . Transportation needs    Medical: Not on file    Non-medical: Not on file  Tobacco Use  . Smoking status: Never Smoker  . Smokeless tobacco: Never Used  Substance and Sexual Activity  . Alcohol use: No    Frequency: Never  . Drug use: No  . Sexual activity: Not Currently    Birth control/protection: Surgical    Comment: ABLATION  Lifestyle  . Physical activity    Days per week: Not on file    Minutes per session: Not on file  . Stress: Not on file  Relationships  . Social Herbalist on phone: Not on file    Gets together: Not on file    Attends religious service: Not on file    Active member of club or organization: Not on file    Attends meetings of clubs or organizations: Not on file    Relationship status: Not on file  .  Intimate partner violence    Fear of current or ex partner: Not on file    Emotionally abused: Not on file    Physically abused: Not on file    Forced sexual activity: Not on file  Other Topics Concern  . Not on file  Social History Narrative  . Not on file    Family History  Problem Relation Age of Onset  . CAD Father   . Transient ischemic attack Father   . Diabetes Father   . Cerebrovascular Accident Father   . Heart disease Father        M-MVP; F BETA;BLOCKERS  . Hypothyroidism Father   . Hyperthyroidism Mother   . Cancer Maternal Uncle        KIDNEY  . Uterine cancer Maternal Aunt 69    Anti-infectives: Anti-infectives (From admission, onward)   Start     Dose/Rate Route Frequency Ordered Stop   12/13/18 1800  vancomycin (VANCOCIN) 1,500 mg in sodium chloride 0.9 % 500 mL IVPB     1,500 mg 250 mL/hr over 120 Minutes Intravenous  Once 12/13/18 1748     12/13/18 1745  ceFEPIme (MAXIPIME) 2 g in sodium chloride 0.9 % 100 mL IVPB     2 g 200 mL/hr over 30 Minutes Intravenous  Once 12/13/18 1740 12/13/18 1826   12/13/18 1745  metroNIDAZOLE (FLAGYL) IVPB 500 mg     500 mg 100 mL/hr over 60 Minutes Intravenous  Once 12/13/18 1740     12/13/18 1745  vancomycin (VANCOCIN) IVPB 1000 mg/200 mL premix     1,000 mg 200 mL/hr over 60 Minutes Intravenous  Once 12/13/18 1740 12/13/18 1904      Current Facility-Administered Medications  Medication Dose Route Frequency Provider Last Rate Last Dose  . metroNIDAZOLE (FLAGYL) IVPB 500 mg  500 mg Intravenous Once Carrie Mew, MD 100 mL/hr at 12/13/18 1950 500 mg at 12/13/18 1950  . vancomycin (VANCOCIN) 1,500 mg in sodium chloride 0.9 % 500 mL IVPB  1,500 mg Intravenous Once Carrie Mew, MD 250 mL/hr at 12/13/18 1959 1,500 mg at 12/13/18 1959   Current Outpatient Medications  Medication Sig Dispense Refill  . albuterol (PROVENTIL) (2.5 MG/3ML) 0.083% nebulizer solution Take 3 mLs (2.5 mg total) by nebulization every 6  (six) hours as needed for wheezing or shortness of breath. 75 mL 12  . amLODipine (NORVASC) 5 MG tablet Take 1 tablet (5 mg total) by mouth daily. 30 tablet 0  .  budesonide (PULMICORT) 0.5 MG/2ML nebulizer solution Take 2 mLs (0.5 mg total) by nebulization 2 (two) times daily. 75 mL 12  . cetirizine (ZYRTEC) 10 MG tablet TAKE 1 TABLET BY MOUTH EVERY DAY 90 tablet 0  . chlorpheniramine-HYDROcodone (TUSSIONEX) 10-8 MG/5ML SUER TAKE 5 MILLILITERS BY ORAL ROUTE EVERY 12 HOURS    . clotrimazole-betamethasone (LOTRISONE) cream AAA BID prn sx for up to 2 wks 45 g 0  . [facility] ASPIRIN ADULT LOW DOSE 81 MG chewable tablet Chew 81 mg by mouth daily.    . isosorbide mononitrate (IMDUR) 30 MG 24 hr tablet Take 15 mg by mouth daily.     Marland Kitchen LORazepam (ATIVAN) 1 MG tablet Take 1 mg by mouth 2 (two) times daily.     . mometasone (NASONEX) 50 MCG/ACT nasal spray PLACE 2 SPRAYS INTO THE NOSE DAILY. 17 g 1  . montelukast (SINGULAIR) 10 MG tablet Take 10 mg by mouth at bedtime.    Marland Kitchen NITROSTAT 0.4 MG SL tablet Place 0.4 mg under the tongue every 5 (five) minutes as needed for chest pain.     . pantoprazole (PROTONIX) 20 MG tablet Take 20 mg by mouth 2 (two) times daily.     Marland Kitchen PROAIR HFA 108 (90 Base) MCG/ACT inhaler Inhale 2 puffs into the lungs every 6 (six) hours as needed.  5  . propranolol (INDERAL) 10 MG tablet Take by mouth.    . rosuvastatin (CRESTOR) 10 MG tablet Take 10 mg by mouth at bedtime.     . SYMBICORT 160-4.5 MCG/ACT inhaler TAKE 2 PUFFS BY MOUTH TWICE A DAY 10.2 Inhaler 12  . ticagrelor (BRILINTA) 60 MG TABS tablet Take 60 mg by mouth 2 (two) times daily.     . Tiotropium Bromide Monohydrate (SPIRIVA RESPIMAT) 1.25 MCG/ACT AERS Inhale 2 puffs into the lungs daily. 1 Inhaler 5     Objective: Vital signs in last 24 hours: Temp:  [99.3 F (37.4 C)] 99.3 F (37.4 C) (12/05 1736) Pulse Rate:  [118-148] 118 (12/05 1954) Resp:  [20-28] 20 (12/05 1954) BP: (120-137)/(78-89) 120/78 (12/05 1954) SpO2:   [90 %-94 %] 90 % (12/05 1954) Weight:  [338 kg] 107 kg (12/05 1745)  Intake/Output from previous day: No intake/output data recorded. Intake/Output this shift: Total I/O In: 2200 [IV Piggyback:2200] Out: -    Physical Exam Vitals signs reviewed.  Constitutional:      Appearance: She is obese. She is ill-appearing.  HENT:     Head: Normocephalic and atraumatic.  Neck:     Musculoskeletal: Normal range of motion and neck supple.  Cardiovascular:     Rate and Rhythm: Regular rhythm. Tachycardia present.     Heart sounds: Normal heart sounds.  Pulmonary:     Effort: Pulmonary effort is normal. No respiratory distress.     Breath sounds: Normal breath sounds.  Abdominal:     Tenderness: There is abdominal tenderness (LLQ pain). There is left CVA tenderness.  Musculoskeletal: Normal range of motion.        General: No swelling or tenderness.  Skin:    General: Skin is warm and dry.  Neurological:     General: No focal deficit present.     Mental Status: She is alert and oriented to person, place, and time.  Psychiatric:     Comments: She is  ill and uncomfortable.      Lab Results:  Recent Labs    12/13/18 1737  WBC 22.2*  HGB 14.7  HCT 44.6  PLT  158   BMET Recent Labs    12/13/18 1737  NA 137  K 3.3*  CL 102  CO2 19*  GLUCOSE 129*  BUN 30*  CREATININE 1.85*  CALCIUM 8.8*   PT/INR Recent Labs    12/13/18 1737  LABPROT 15.1  INR 1.2   ABG No results for input(s): PHART, HCO3 in the last 72 hours.  Invalid input(s): PCO2, PO2  Studies/Results: Ct Abdomen Pelvis Wo Contrast  Result Date: 12/13/2018 CLINICAL DATA:  Altered mental status, LEFT side abdominal pain for 3 days, febrile yesterday to 103 degrees, history asthma, celiac disease, CHF, coronary artery disease post MI and coronary stenting, hypertension EXAM: CT CHEST, ABDOMEN AND PELVIS WITHOUT CONTRAST TECHNIQUE: Multidetector CT imaging of the chest, abdomen and pelvis was performed  following the standard protocol without IV contrast. Sagittal and coronal MPR images reconstructed from axial data set. Oral contrast was not administered. COMPARISON:  CT angio chest 05/21/2016, CT abdomen and pelvis 10/03/2011 FINDINGS: CT CHEST FINDINGS Cardiovascular: Aorta normal caliber without aneurysm or dissection. Atherosclerotic calcifications within coronary arteries with note of coronary stent. Heart normal size. No pericardial effusion. Mediastinum/Nodes: Base of cervical region normal appearance. Esophagus unremarkable. No thoracic adenopathy. Lungs/Pleura: Subsegmental atelectasis at minor fissure. Lungs otherwise clear. No infiltrate, pleural effusion or pneumothorax. Musculoskeletal: Osseous structures unremarkable. CT ABDOMEN PELVIS FINDINGS Hepatobiliary: Distended gallbladder 5 cm transverse. Liver unremarkable. Pancreas: Normal appearance Spleen: Normal appearance. Small splenule anterior to spleen. Adrenals/Urinary Tract: Adrenal glands normal appearance. Large cyst upper to mid RIGHT kidney 5.7 x 5.5 cm image 64. Small parenchymal calcification versus nonobstructing calculus at upper pole RIGHT kidney. LEFT hydronephrosis secondary to a 14 x 9 mm LEFT UPJ calculus. Mild LEFT perinephric edema and LEFT renal enlargement. Additional tiny nonobstructing renal calculi. Ureters decompressed without calcification. Bladder unremarkable. Stomach/Bowel: Normal appendix. Small to moderate-sized hiatal hernia. Remainder of stomach and bowel loops unremarkable. Vascular/Lymphatic: Atherosclerotic calcifications aorta and iliac arteries without aneurysm. No adenopathy. Reproductive: Atrophic uterus and ovaries Other: No free air or free fluid. No hernia or acute inflammatory process otherwise seen. Musculoskeletal: Osseous structures unremarkable. IMPRESSION: LEFT hydronephrosis secondary to a 14 x 9 mm LEFT UPJ calculus. Additional tiny nonobstructing renal calculi bilaterally. Small to moderate-sized  hiatal hernia. Coronary artery calcifications with note of coronary stent. Distended gallbladder. Aortic Atherosclerosis (ICD10-I70.0). Electronically Signed   By: Lavonia Dana M.D.   On: 12/13/2018 19:05   Ct Chest Wo Contrast  Result Date: 12/13/2018 CLINICAL DATA:  Altered mental status, LEFT side abdominal pain for 3 days, febrile yesterday to 103 degrees, history asthma, celiac disease, CHF, coronary artery disease post MI and coronary stenting, hypertension EXAM: CT CHEST, ABDOMEN AND PELVIS WITHOUT CONTRAST TECHNIQUE: Multidetector CT imaging of the chest, abdomen and pelvis was performed following the standard protocol without IV contrast. Sagittal and coronal MPR images reconstructed from axial data set. Oral contrast was not administered. COMPARISON:  CT angio chest 05/21/2016, CT abdomen and pelvis 10/03/2011 FINDINGS: CT CHEST FINDINGS Cardiovascular: Aorta normal caliber without aneurysm or dissection. Atherosclerotic calcifications within coronary arteries with note of coronary stent. Heart normal size. No pericardial effusion. Mediastinum/Nodes: Base of cervical region normal appearance. Esophagus unremarkable. No thoracic adenopathy. Lungs/Pleura: Subsegmental atelectasis at minor fissure. Lungs otherwise clear. No infiltrate, pleural effusion or pneumothorax. Musculoskeletal: Osseous structures unremarkable. CT ABDOMEN PELVIS FINDINGS Hepatobiliary: Distended gallbladder 5 cm transverse. Liver unremarkable. Pancreas: Normal appearance Spleen: Normal appearance. Small splenule anterior to spleen. Adrenals/Urinary Tract: Adrenal glands normal appearance. Large cyst upper to mid  RIGHT kidney 5.7 x 5.5 cm image 64. Small parenchymal calcification versus nonobstructing calculus at upper pole RIGHT kidney. LEFT hydronephrosis secondary to a 14 x 9 mm LEFT UPJ calculus. Mild LEFT perinephric edema and LEFT renal enlargement. Additional tiny nonobstructing renal calculi. Ureters decompressed without  calcification. Bladder unremarkable. Stomach/Bowel: Normal appendix. Small to moderate-sized hiatal hernia. Remainder of stomach and bowel loops unremarkable. Vascular/Lymphatic: Atherosclerotic calcifications aorta and iliac arteries without aneurysm. No adenopathy. Reproductive: Atrophic uterus and ovaries Other: No free air or free fluid. No hernia or acute inflammatory process otherwise seen. Musculoskeletal: Osseous structures unremarkable. IMPRESSION: LEFT hydronephrosis secondary to a 14 x 9 mm LEFT UPJ calculus. Additional tiny nonobstructing renal calculi bilaterally. Small to moderate-sized hiatal hernia. Coronary artery calcifications with note of coronary stent. Distended gallbladder. Aortic Atherosclerosis (ICD10-I70.0). Electronically Signed   By: Lavonia Dana M.D.   On: 12/13/2018 19:05   Dg Chest Port 1 View  Result Date: 12/13/2018 CLINICAL DATA:  Altered mental status. EXAM: PORTABLE CHEST 1 VIEW COMPARISON:  06/06/2018 FINDINGS: Lung volumes are low with linear opacities at the lung bases bilaterally. No signs of dense consolidation or evidence of pleural effusion. No acute bone finding. IMPRESSION: Low lung volumes with basilar atelectasis. No consolidation or pleural effusion. Electronically Signed   By: Zetta Bills M.D.   On: 12/13/2018 18:33   US Abdomen Limited Ruq  Result Date: 12/13/2018 CLINICAL DATA:  Abdominal pain, sepsis, and elevated LFTs. EXAM: ULTRASOUND ABDOMEN LIMITED RIGHT UPPER QUADRANT COMPARISON:  None. FINDINGS: Gallbladder: The gallbladder is somewhat distended measuring up to 5.8 cm. No wall thickening, stones, sludge, or Murphy's sign. No pericholecystic fluid. Common bile duct: Diameter: 5.4 mm Liver: No focal lesion identified. Within normal limits in parenchymal echogenicity. Portal vein is patent on color Doppler imaging with normal direction of blood flow towards the liver. Other: None. IMPRESSION: 1. The gallbladder is distended to 5.8 cm with no stones,  wall thickening, or Murphy's sign identified. If there is concern for acalculous cholecystitis, a HIDA scan could be performed. 2. Probable hepatic steatosis. Electronically Signed   By: Dorise Bullion III M.D   On: 12/13/2018 20:45   I have reviewed the pertinent records, labs and imaging and discussed her case with Dr. Joni Fears.    Assessment: 25m LUPJ stone with obstruction and sepsis and AKI.   She is on Brilinta so a percutaneous nephrostomy is not possible at this time.   I will get her set up for cystoscopy with left RTG and stent.   I have reviewed the risks in detail including bleeding, infection, ureteral injury, inability to get by the stone, need for secondary procedures to remove the stone, thrombotic events and anesthetic complications.    I have recommended admission to the hospitalist service.    CC: Dr. PCarrie Mew    JIrine Seal12/05/2018 [phone]

## 2018-12-14 ENCOUNTER — Inpatient Hospital Stay: Payer: Self-pay

## 2018-12-14 DIAGNOSIS — N135 Crossing vessel and stricture of ureter without hydronephrosis: Secondary | ICD-10-CM

## 2018-12-14 DIAGNOSIS — A419 Sepsis, unspecified organism: Secondary | ICD-10-CM

## 2018-12-14 DIAGNOSIS — N179 Acute kidney failure, unspecified: Secondary | ICD-10-CM

## 2018-12-14 LAB — CBC
HCT: 35.9 % — ABNORMAL LOW (ref 36.0–46.0)
Hemoglobin: 11.9 g/dL — ABNORMAL LOW (ref 12.0–15.0)
MCH: 28 pg (ref 26.0–34.0)
MCHC: 33.1 g/dL (ref 30.0–36.0)
MCV: 84.5 fL (ref 80.0–100.0)
Platelets: 152 10*3/uL (ref 150–400)
RBC: 4.25 MIL/uL (ref 3.87–5.11)
RDW: 16.2 % — ABNORMAL HIGH (ref 11.5–15.5)
WBC: 17.9 10*3/uL — ABNORMAL HIGH (ref 4.0–10.5)
nRBC: 0 % (ref 0.0–0.2)

## 2018-12-14 LAB — COMPREHENSIVE METABOLIC PANEL
ALT: 29 U/L (ref 0–44)
AST: 43 U/L — ABNORMAL HIGH (ref 15–41)
Albumin: 2.8 g/dL — ABNORMAL LOW (ref 3.5–5.0)
Alkaline Phosphatase: 103 U/L (ref 38–126)
Anion gap: 9 (ref 5–15)
BUN: 26 mg/dL — ABNORMAL HIGH (ref 6–20)
CO2: 20 mmol/L — ABNORMAL LOW (ref 22–32)
Calcium: 7.4 mg/dL — ABNORMAL LOW (ref 8.9–10.3)
Chloride: 112 mmol/L — ABNORMAL HIGH (ref 98–111)
Creatinine, Ser: 1.52 mg/dL — ABNORMAL HIGH (ref 0.44–1.00)
GFR calc Af Amer: 43 mL/min — ABNORMAL LOW (ref >60)
GFR calc non Af Amer: 37 mL/min — ABNORMAL LOW (ref >60)
Glucose, Bld: 144 mg/dL — ABNORMAL HIGH (ref 70–99)
Potassium: 3.5 mmol/L (ref 3.5–5.1)
Sodium: 141 mmol/L (ref 135–145)
Total Bilirubin: 1.4 mg/dL — ABNORMAL HIGH (ref 0.3–1.2)
Total Protein: 6.2 g/dL — ABNORMAL LOW (ref 6.5–8.1)

## 2018-12-14 LAB — URINE DRUG SCREEN, QUALITATIVE (ARMC ONLY)
Amphetamines, Ur Screen: NOT DETECTED
Barbiturates, Ur Screen: NOT DETECTED
Benzodiazepine, Ur Scrn: POSITIVE — AB
Cannabinoid 50 Ng, Ur ~~LOC~~: NOT DETECTED
Cocaine Metabolite,Ur ~~LOC~~: NOT DETECTED
MDMA (Ecstasy)Ur Screen: NOT DETECTED
Methadone Scn, Ur: NOT DETECTED
Opiate, Ur Screen: POSITIVE — AB
Phencyclidine (PCP) Ur S: NOT DETECTED
Tricyclic, Ur Screen: NOT DETECTED

## 2018-12-14 LAB — CORTISOL-AM, BLOOD: Cortisol - AM: 24.2 ug/dL — ABNORMAL HIGH (ref 6.7–22.6)

## 2018-12-14 LAB — PROTIME-INR
INR: 1.1 (ref 0.8–1.2)
Prothrombin Time: 14.4 seconds (ref 11.4–15.2)

## 2018-12-14 LAB — TROPONIN I (HIGH SENSITIVITY): Troponin I (High Sensitivity): 49 ng/L — ABNORMAL HIGH (ref <18)

## 2018-12-14 LAB — MRSA PCR SCREENING: MRSA by PCR: NEGATIVE

## 2018-12-14 LAB — PROCALCITONIN: Procalcitonin: 125.07 ng/mL

## 2018-12-14 LAB — HIV ANTIBODY (ROUTINE TESTING W REFLEX): HIV Screen 4th Generation wRfx: NONREACTIVE

## 2018-12-14 LAB — LACTIC ACID, PLASMA: Lactic Acid, Venous: 1.5 mmol/L (ref 0.5–1.9)

## 2018-12-14 MED ORDER — SODIUM CHLORIDE 0.9 % IV SOLN
0.0000 ug/min | INTRAVENOUS | Status: DC
  Administered 2018-12-14 (×2): 130 ug/min via INTRAVENOUS
  Administered 2018-12-14: 80 ug/min via INTRAVENOUS
  Administered 2018-12-14: 20 ug/min via INTRAVENOUS
  Filled 2018-12-14: qty 1
  Filled 2018-12-14 (×3): qty 10
  Filled 2018-12-14: qty 1
  Filled 2018-12-14: qty 10

## 2018-12-14 MED ORDER — SODIUM CHLORIDE 0.9 % IV SOLN
1.0000 g | Freq: Two times a day (BID) | INTRAVENOUS | Status: DC
  Administered 2018-12-14 (×3): 1 g via INTRAVENOUS
  Filled 2018-12-14 (×4): qty 1

## 2018-12-14 MED ORDER — BENZONATATE 100 MG PO CAPS
100.0000 mg | ORAL_CAPSULE | Freq: Three times a day (TID) | ORAL | Status: DC
  Administered 2018-12-14 – 2018-12-18 (×11): 100 mg via ORAL
  Filled 2018-12-14 (×11): qty 1

## 2018-12-14 MED ORDER — ROSUVASTATIN CALCIUM 10 MG PO TABS
10.0000 mg | ORAL_TABLET | Freq: Every day | ORAL | Status: DC
  Administered 2018-12-14 – 2018-12-17 (×4): 10 mg via ORAL
  Filled 2018-12-14 (×4): qty 1

## 2018-12-14 MED ORDER — SODIUM CHLORIDE 0.9 % IV SOLN
1.0000 g | INTRAVENOUS | Status: DC
  Filled 2018-12-14: qty 10

## 2018-12-14 MED ORDER — ALBUTEROL SULFATE (2.5 MG/3ML) 0.083% IN NEBU
2.5000 mg | INHALATION_SOLUTION | Freq: Four times a day (QID) | RESPIRATORY_TRACT | Status: DC | PRN
  Administered 2018-12-14 – 2018-12-15 (×3): 2.5 mg via RESPIRATORY_TRACT
  Filled 2018-12-14 (×3): qty 3

## 2018-12-14 MED ORDER — DEXTROMETHORPHAN POLISTIREX ER 30 MG/5ML PO SUER
30.0000 mg | Freq: Two times a day (BID) | ORAL | Status: DC
  Administered 2018-12-14 – 2018-12-18 (×6): 30 mg via ORAL
  Filled 2018-12-14 (×10): qty 5

## 2018-12-14 MED ORDER — GUAIFENESIN-CODEINE 100-10 MG/5ML PO SOLN: 10.0000 mL | ORAL | Status: DC

## 2018-12-14 MED ORDER — SODIUM CHLORIDE 0.9 % IV SOLN: 1.0000 g | INTRAVENOUS | Status: DC

## 2018-12-14 MED ORDER — SODIUM CHLORIDE 0.9 % IV BOLUS
1000.0000 mL | Freq: Once | INTRAVENOUS | Status: AC
  Administered 2018-12-14: 1000 mL via INTRAVENOUS

## 2018-12-14 MED ORDER — MONTELUKAST SODIUM 10 MG PO TABS
10.0000 mg | ORAL_TABLET | Freq: Every day | ORAL | Status: DC
  Administered 2018-12-14 – 2018-12-17 (×4): 10 mg via ORAL
  Filled 2018-12-14 (×4): qty 1

## 2018-12-14 MED ORDER — TIOTROPIUM BROMIDE MONOHYDRATE 18 MCG IN CAPS
18.0000 ug | ORAL_CAPSULE | Freq: Every day | RESPIRATORY_TRACT | Status: DC
  Administered 2018-12-14 – 2018-12-18 (×5): 18 ug via RESPIRATORY_TRACT
  Filled 2018-12-14 (×2): qty 5

## 2018-12-14 MED ORDER — BUDESONIDE 0.5 MG/2ML IN SUSP
0.5000 mg | Freq: Two times a day (BID) | RESPIRATORY_TRACT | Status: DC
  Administered 2018-12-14 – 2018-12-18 (×9): 0.5 mg via RESPIRATORY_TRACT
  Filled 2018-12-14 (×9): qty 2

## 2018-12-14 NOTE — Anesthesia Postprocedure Evaluation (Signed)
Anesthesia Post Note  Patient: Brenda Craig  Procedure(s) Performed: CYSTOSCOPY WITH RETROGRADE PYELOGRAM/URETERAL STENT PLACEMENT (Left )  Patient location during evaluation: PACU Anesthesia Type: General Level of consciousness: awake and alert Pain management: pain level controlled Vital Signs Assessment: post-procedure vital signs reviewed and stable Respiratory status: spontaneous breathing and respiratory function stable Cardiovascular status: stable Anesthetic complications: no     Last Vitals:  Vitals:   12/14/18 0600 12/14/18 0800  BP: 109/77 109/71  Pulse: 81 79  Resp: (!) 37 16  Temp:  37.8 C  SpO2: 98% 93%    Last Pain:  Vitals:   12/14/18 0800  TempSrc: Oral  PainSc: 0-No pain                 KEPHART,WILLIAM K

## 2018-12-14 NOTE — Progress Notes (Signed)
1 Day Post-Op  Subjective: Brenda Craig is doing much better this morning following stent placement for the 52m LUP obstructing stone with sepsis.   She has no pain or nausea and her temperature curve is declining.  AKI and Leukocytosis improving.   ROS:  Review of Systems  All other systems reviewed and are negative.   Anti-infectives: Anti-infectives (From admission, onward)   Start     Dose/Rate Route Frequency Ordered Stop   12/15/18 0600  cefTRIAXone (ROCEPHIN) 1 g in sodium chloride 0.9 % 100 mL IVPB  Status:  Discontinued     1 g 200 mL/hr over 30 Minutes Intravenous Every 24 hours 12/14/18 0003 12/14/18 0004   12/14/18 0600  ceFEPIme (MAXIPIME) 2 g in sodium chloride 0.9 % 100 mL IVPB  Status:  Discontinued     2 g 200 mL/hr over 30 Minutes Intravenous Every 12 hours 12/13/18 2340 12/13/18 2355   12/14/18 0600  cefTRIAXone (ROCEPHIN) 1 g in sodium chloride 0.9 % 100 mL IVPB  Status:  Discontinued     1 g 200 mL/hr over 30 Minutes Intravenous Every 24 hours 12/14/18 0004 12/14/18 0152   12/14/18 0215  meropenem (MERREM) 1 g in sodium chloride 0.9 % 100 mL IVPB     1 g 200 mL/hr over 30 Minutes Intravenous Every 12 hours 12/14/18 0201     12/13/18 2315  ceFEPIme (MAXIPIME) 2 g in sodium chloride 0.9 % 100 mL IVPB  Status:  Discontinued     2 g 200 mL/hr over 30 Minutes Intravenous  Once 12/13/18 2307 12/13/18 2309   12/13/18 2145  sodium chloride 0.9 % with cefOXitin (MEFOXIN) ADS Med    Note to Pharmacy: MLawerance Sabal  : cabinet override      12/13/18 2145 12/14/18 0959   12/13/18 1800  vancomycin (VANCOCIN) 1,500 mg in sodium chloride 0.9 % 500 mL IVPB     1,500 mg 250 mL/hr over 120 Minutes Intravenous  Once 12/13/18 1748 12/13/18 2159   12/13/18 1745  ceFEPIme (MAXIPIME) 2 g in sodium chloride 0.9 % 100 mL IVPB     2 g 200 mL/hr over 30 Minutes Intravenous  Once 12/13/18 1740 12/13/18 1826   12/13/18 1745  metroNIDAZOLE (FLAGYL) IVPB 500 mg     500 mg 100 mL/hr over 60  Minutes Intravenous  Once 12/13/18 1740 12/13/18 2112   12/13/18 1745  vancomycin (VANCOCIN) IVPB 1000 mg/200 mL premix     1,000 mg 200 mL/hr over 60 Minutes Intravenous  Once 12/13/18 1740 12/13/18 1904      Current Facility-Administered Medications  Medication Dose Route Frequency Provider Last Rate Last Dose  . 0.9 %  sodium chloride infusion   Intravenous Continuous Mansy, Jan A, MD 125 mL/hr at 12/14/18 0800    . acetaminophen (TYLENOL) tablet 650 mg  650 mg Oral Q6H PRN Mansy, JArvella Merles MD       Or  . acetaminophen (TYLENOL) suppository 650 mg  650 mg Rectal Q6H PRN Mansy, Jan A, MD      . albuterol (PROVENTIL) (2.5 MG/3ML) 0.083% nebulizer solution 2.5 mg  2.5 mg Nebulization Q6H PRN Tukov-Yual, Magdalene S, NP      . budesonide (PULMICORT) nebulizer solution 0.5 mg  0.5 mg Nebulization BID Tukov-Yual, MArlyss Gandy NP      . Chlorhexidine Gluconate Cloth 2 % PADS 6 each  6 each Topical Q0600 Mansy, JArvella Merles MD   6 each at 12/14/18 [phone] . dexmedetomidine (PRECEDEX) 400 MCG/100ML (  4 mcg/mL) infusion  0.4-1.2 mcg/kg/hr Intravenous Titrated Tukov-Yual, Arlyss Gandy, NP   Stopped at 12/14/18 0617  . enoxaparin (LOVENOX) injection 40 mg  40 mg Subcutaneous Q24H Mansy, Jan A, MD   40 mg at 12/14/18 0941  . magnesium hydroxide (MILK OF MAGNESIA) suspension 30 mL  30 mL Oral Daily PRN Mansy, Jan A, MD      . meropenem (MERREM) 1 g in sodium chloride 0.9 % 100 mL IVPB  1 g Intravenous Q12H Hallaji, Sheema M, RPH 200 mL/hr at 12/14/18 0941 1 g at 12/14/18 0941  . montelukast (SINGULAIR) tablet 10 mg  10 mg Oral QHS Tukov-Yual, Magdalene S, NP      . ondansetron (ZOFRAN) tablet 4 mg  4 mg Oral Q6H PRN Mansy, Jan A, MD       Or  . ondansetron [facility]) injection 4 mg  4 mg Intravenous Q6H PRN Mansy, Jan A, MD      . phenylephrine (NEO-SYNEPHRINE) 10 mg in sodium chloride 0.9 % 250 mL (0.04 mg/mL) infusion  0-400 mcg/min Intravenous Titrated Tukov-Yual, Magdalene S, NP 195 mL/hr at 12/14/18 0800 130  mcg/min at 12/14/18 0800  . rosuvastatin (CRESTOR) tablet 10 mg  10 mg Oral QHS Tukov-Yual, Magdalene S, NP      . tiotropium (SPIRIVA) inhalation capsule ([facility] use ONLY) 18 mcg  18 mcg Inhalation Daily Tukov-Yual, Magdalene S, NP   18 mcg at 12/14/18 0941  . traZODone (DESYREL) tablet 25 mg  25 mg Oral QHS PRN Mansy, Arvella Merles, MD         Objective: Vital signs in last 24 hours: Temp:  [98.5 F (36.9 C)-100.3 F (37.9 C)] 100 F (37.8 C) (12/06 0800) Pulse Rate:  [62-148] 79 (12/06 0800) Resp:  [16-42] 16 (12/06 0800) BP: (83-137)/(60-108) 109/71 (12/06 0800) SpO2:  [79 %-98 %] 93 % (12/06 0800) Weight:  [107 kg-107.4 kg] 107.4 kg (12/06 0107)  Intake/Output from previous day: 12/05 0701 - 12/06 0700 In: 6096.1 [I.V.:2121.1; IV Piggyback:3974.9] Out: 755 [Urine:750; Blood:5] Intake/Output this shift: Total I/O In: 907.8 [I.V.:907.8] Out: -    Physical Exam Vitals signs reviewed.  Constitutional:      Appearance: Normal appearance.  Cardiovascular:     Rate and Rhythm: Normal rate and regular rhythm.  Pulmonary:     Effort: Pulmonary effort is normal. No respiratory distress.  Abdominal:     Palpations: Abdomen is soft.     Tenderness: There is no abdominal tenderness.  Neurological:     Mental Status: She is alert.     Lab Results:  Recent Labs    12/13/18 1737 12/14/18 0429  WBC 22.2* 17.9*  HGB 14.7 11.9*  HCT 44.6 35.9*  PLT 158 152   BMET Recent Labs    12/13/18 1737 12/14/18 0429  NA 137 141  K 3.3* 3.5  CL 102 112*  CO2 19* 20*  GLUCOSE 129* 144*  BUN 30* 26*  CREATININE 1.85* 1.52*  CALCIUM 8.8* 7.4*   PT/INR Recent Labs    12/13/18 1737 12/14/18 0429  LABPROT 15.1 14.4  INR 1.2 1.1   ABG No results for input(s): PHART, HCO3 in the last 72 hours.  Invalid input(s): PCO2, PO2  Studies/Results: Ct Abdomen Pelvis Wo Contrast  Result Date: 12/13/2018 CLINICAL DATA:  Altered mental status, LEFT side abdominal pain for 3 days, febrile  yesterday to 103 degrees, history asthma, celiac disease, CHF, coronary artery disease post MI and coronary stenting, hypertension EXAM: CT CHEST, ABDOMEN AND PELVIS  WITHOUT CONTRAST TECHNIQUE: Multidetector CT imaging of the chest, abdomen and pelvis was performed following the standard protocol without IV contrast. Sagittal and coronal MPR images reconstructed from axial data set. Oral contrast was not administered. COMPARISON:  CT angio chest 05/21/2016, CT abdomen and pelvis 10/03/2011 FINDINGS: CT CHEST FINDINGS Cardiovascular: Aorta normal caliber without aneurysm or dissection. Atherosclerotic calcifications within coronary arteries with note of coronary stent. Heart normal size. No pericardial effusion. Mediastinum/Nodes: Base of cervical region normal appearance. Esophagus unremarkable. No thoracic adenopathy. Lungs/Pleura: Subsegmental atelectasis at minor fissure. Lungs otherwise clear. No infiltrate, pleural effusion or pneumothorax. Musculoskeletal: Osseous structures unremarkable. CT ABDOMEN PELVIS FINDINGS Hepatobiliary: Distended gallbladder 5 cm transverse. Liver unremarkable. Pancreas: Normal appearance Spleen: Normal appearance. Small splenule anterior to spleen. Adrenals/Urinary Tract: Adrenal glands normal appearance. Large cyst upper to mid RIGHT kidney 5.7 x 5.5 cm image 64. Small parenchymal calcification versus nonobstructing calculus at upper pole RIGHT kidney. LEFT hydronephrosis secondary to a 14 x 9 mm LEFT UPJ calculus. Mild LEFT perinephric edema and LEFT renal enlargement. Additional tiny nonobstructing renal calculi. Ureters decompressed without calcification. Bladder unremarkable. Stomach/Bowel: Normal appendix. Small to moderate-sized hiatal hernia. Remainder of stomach and bowel loops unremarkable. Vascular/Lymphatic: Atherosclerotic calcifications aorta and iliac arteries without aneurysm. No adenopathy. Reproductive: Atrophic uterus and ovaries Other: No free air or free fluid.  No hernia or acute inflammatory process otherwise seen. Musculoskeletal: Osseous structures unremarkable. IMPRESSION: LEFT hydronephrosis secondary to a 14 x 9 mm LEFT UPJ calculus. Additional tiny nonobstructing renal calculi bilaterally. Small to moderate-sized hiatal hernia. Coronary artery calcifications with note of coronary stent. Distended gallbladder. Aortic Atherosclerosis (ICD10-I70.0). Electronically Signed   By: Lavonia Dana M.D.   On: 12/13/2018 19:05   Ct Chest Wo Contrast  Result Date: 12/13/2018 CLINICAL DATA:  Altered mental status, LEFT side abdominal pain for 3 days, febrile yesterday to 103 degrees, history asthma, celiac disease, CHF, coronary artery disease post MI and coronary stenting, hypertension EXAM: CT CHEST, ABDOMEN AND PELVIS WITHOUT CONTRAST TECHNIQUE: Multidetector CT imaging of the chest, abdomen and pelvis was performed following the standard protocol without IV contrast. Sagittal and coronal MPR images reconstructed from axial data set. Oral contrast was not administered. COMPARISON:  CT angio chest 05/21/2016, CT abdomen and pelvis 10/03/2011 FINDINGS: CT CHEST FINDINGS Cardiovascular: Aorta normal caliber without aneurysm or dissection. Atherosclerotic calcifications within coronary arteries with note of coronary stent. Heart normal size. No pericardial effusion. Mediastinum/Nodes: Base of cervical region normal appearance. Esophagus unremarkable. No thoracic adenopathy. Lungs/Pleura: Subsegmental atelectasis at minor fissure. Lungs otherwise clear. No infiltrate, pleural effusion or pneumothorax. Musculoskeletal: Osseous structures unremarkable. CT ABDOMEN PELVIS FINDINGS Hepatobiliary: Distended gallbladder 5 cm transverse. Liver unremarkable. Pancreas: Normal appearance Spleen: Normal appearance. Small splenule anterior to spleen. Adrenals/Urinary Tract: Adrenal glands normal appearance. Large cyst upper to mid RIGHT kidney 5.7 x 5.5 cm image 64. Small parenchymal  calcification versus nonobstructing calculus at upper pole RIGHT kidney. LEFT hydronephrosis secondary to a 14 x 9 mm LEFT UPJ calculus. Mild LEFT perinephric edema and LEFT renal enlargement. Additional tiny nonobstructing renal calculi. Ureters decompressed without calcification. Bladder unremarkable. Stomach/Bowel: Normal appendix. Small to moderate-sized hiatal hernia. Remainder of stomach and bowel loops unremarkable. Vascular/Lymphatic: Atherosclerotic calcifications aorta and iliac arteries without aneurysm. No adenopathy. Reproductive: Atrophic uterus and ovaries Other: No free air or free fluid. No hernia or acute inflammatory process otherwise seen. Musculoskeletal: Osseous structures unremarkable. IMPRESSION: LEFT hydronephrosis secondary to a 14 x 9 mm LEFT UPJ calculus. Additional tiny nonobstructing renal calculi bilaterally.  Small to moderate-sized hiatal hernia. Coronary artery calcifications with note of coronary stent. Distended gallbladder. Aortic Atherosclerosis (ICD10-I70.0). Electronically Signed   By: Lavonia Dana M.D.   On: 12/13/2018 19:05   Dg Chest Port 1 View  Result Date: 12/13/2018 CLINICAL DATA:  Altered mental status. EXAM: PORTABLE CHEST 1 VIEW COMPARISON:  06/06/2018 FINDINGS: Lung volumes are low with linear opacities at the lung bases bilaterally. No signs of dense consolidation or evidence of pleural effusion. No acute bone finding. IMPRESSION: Low lung volumes with basilar atelectasis. No consolidation or pleural effusion. Electronically Signed   By: Zetta Bills M.D.   On: 12/13/2018 18:33   Dg Or Urology Cysto Image ([facility] Only)  Result Date: 12/13/2018 There is no interpretation for this exam.  This order is for images obtained during a surgical procedure.  Please See "Surgeries" Tab for more information regarding the procedure.   [place] Ekg Site Rite  Result Date: 12/14/2018 If Site Rite image not attached, placement could not be confirmed due to current cardiac  rhythm.  US Abdomen Limited Ruq  Result Date: 12/13/2018 CLINICAL DATA:  Abdominal pain, sepsis, and elevated LFTs. EXAM: ULTRASOUND ABDOMEN LIMITED RIGHT UPPER QUADRANT COMPARISON:  None. FINDINGS: Gallbladder: The gallbladder is somewhat distended measuring up to 5.8 cm. No wall thickening, stones, sludge, or Murphy's sign. No pericholecystic fluid. Common bile duct: Diameter: 5.4 mm Liver: No focal lesion identified. Within normal limits in parenchymal echogenicity. Portal vein is patent on color Doppler imaging with normal direction of blood flow towards the liver. Other: None. IMPRESSION: 1. The gallbladder is distended to 5.8 cm with no stones, wall thickening, or Murphy's sign identified. If there is concern for acalculous cholecystitis, a HIDA scan could be performed. 2. Probable hepatic steatosis. Electronically Signed   By: Dorise Bullion III M.D   On: 12/13/2018 20:45     Assessment and Plan: Obstructing left upper pole stone with sepsis.   She is significantly improved s/p stenting on current therapy.   Continue antibiotics.   I have notified [facility] to arrange follow up.        LOS: 1 day    Irine Seal 12/14/2018 [phone]

## 2018-12-14 NOTE — Progress Notes (Signed)
CRITICAL CARE PROGRESS NOTE    Name: Brenda Craig MRN: [id number] DOB: [birth year]     LOS: 1   SUBJECTIVE FINDINGS & SIGNIFICANT EVENTS   Patient description:  As per NP documentation-This is a 58 year old female with a medical history as indicated below who presented to the ED with fever, altered mental status and left flank pain.  History was initially obtained from ED and EMS records but this morning patient is awake enough to update her on history.  She states that it all started November 11 when she developed a fever.  She was tested for Covid and was negative.  She was seen by her PCP and placed on antibiotics.  Once she completed antibiotics the fever resumed and was  worse.  She said her fever went as high as 103 F at home.  She became nauseous and then developed left-sided abdominal pain that progressively got worse.  Today her son who is a paramedic came by and said she needed to go to the emergency room.  At the ED she was febrile and complaining of abdominal pain and nausea.  Her ED work-up revealed a grossly abnormal urinalysis, and her CT abdomen revealed a 14 mm left kidney stone with some obstruction.  Her labs showed a creatinine of 1.85 up from her baseline of 0.87, lactic acid of 2.9, WBC of 22.2, and a procalcitonin greater than 150.  Urology was consulted and she was taken to the OR and underwent cystoscopy with left retrograde pyelogram and insertion of a left double-J stent.  Postoperatively, she became severely hypotensive requiring pressors hence she was transferred to the ICU for further management.Upon arrival in the ICU, patient remained hypotensive but became severely delirious and agitated requiring Precedex.  This morning she is more awake and lucid.  She reports persistent mild left flank  pain but denies any nausea.  She states that she usually gets hypotensive postoperatively. She has a history of sleep apnea and uses CPAP at home.    Lines / Drains: PIVx2  Cultures / Sepsis markers: Urine culture in process Nasal MRSA PCR is negative Procalcitonin trending severe elevation more than 125  Antibiotics: Empiric vancomycin/Rocephin/Flagyl narrowed to meropenem   Tests / Events: Transthoracic echo Nephrology consultation due to nephrolithiasis and hydronephrosis     PAST MEDICAL HISTORY   Past Medical History:  Diagnosis Date   Abnormal Pap smear of cervix 1988   Acid reflux 01/17/2013   Allergic rhinitis    Anemia    Anxiety    Asthma    Celiac disease    Cervical dysplasia 1988   CHF (congestive heart failure) (HCC)    Coronary artery disease    Heart trouble    History of cardiac arrest    History of mammogram 03/25/2013; 12/07/14   BIRADS 1; NEG   History of Papanicolaou smear of cervix 03/20/12; 12/28/15   -/-; -/-   Hypercholesteremia    Hypertension    Menopausal symptoms    MI (myocardial infarction) ([place])    Mitral valve prolapse    Sleep apnea    Vulvovaginitis    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     SURGICAL HISTORY   Past Surgical History:  Procedure Laterality Date   ABLATION  2013   [facility]   BACK SURGERY     BACK SURGERY  2016; 2017   L4, L5   CARDIAC SURGERY  03/19/12; 10/2014   HEART CATH AND STENT  CESAREAN SECTION     1986; [place]   COLONOSCOPY  08/2010   16 POLYPS (ALL BENIGN) DX C CELIAC DISEASE   COMBINED HYSTEROSCOPY DIAGNOSTIC / D&C  2013   [facility]   DILATION AND CURETTAGE OF UTERUS  2001   ESOPHAGOGASTRODUODENOSCOPY  08/2010   DX C CELIAC DISEASE   HEART STENTS  05/27/2011   HIP SURGERY  2016   RIGHT IT BAND AND BURSA REMOVAL   OOPHORECTOMY Right 1982     FAMILY HISTORY   Family History  Problem Relation Age of Onset     CAD Father    Transient ischemic attack Father    Diabetes Father    Cerebrovascular Accident Father    Heart disease Father        M-MVP; F BETA;BLOCKERS   Hypothyroidism Father    Hyperthyroidism Mother    Cancer Maternal Uncle        KIDNEY   Uterine cancer Maternal Aunt 69     SOCIAL HISTORY   Social History   Tobacco Use   Smoking status: Never Smoker   Smokeless tobacco: Never Used  Substance Use Topics   Alcohol use: No    Frequency: Never   Drug use: No     MEDICATIONS   Current Medication:  Current Facility-Administered Medications:    0.9 %  sodium chloride infusion, , Intravenous, Continuous, Mansy, Jan A, MD, Stopped at 12/14/18 0941   acetaminophen (TYLENOL) tablet 650 mg, 650 mg, Oral, Q6H PRN **OR** acetaminophen (TYLENOL) suppository 650 mg, 650 mg, Rectal, Q6H PRN, Mansy, Jan A, MD   albuterol (PROVENTIL) (2.5 MG/3ML) 0.083% nebulizer solution 2.5 mg, 2.5 mg, Nebulization, Q6H PRN, Tukov-Yual, Magdalene S, NP   budesonide (PULMICORT) nebulizer solution 0.5 mg, 0.5 mg, Nebulization, BID, Tukov-Yual, Magdalene S, NP, 0.5 mg at 12/14/18 1047   Chlorhexidine Gluconate Cloth 2 % PADS 6 each, 6 each, Topical, Q0600, Mansy, Jan A, MD, 6 each at 12/14/18 0624   dexmedetomidine (PRECEDEX) 400 MCG/100ML (4 mcg/mL) infusion, 0.4-1.2 mcg/kg/hr, Intravenous, Titrated, Tukov-Yual, Magdalene S, NP, Stopped at 12/14/18 0617   enoxaparin (LOVENOX) injection 40 mg, 40 mg, Subcutaneous, Q24H, Mansy, Jan A, MD, 40 mg at 12/14/18 0941   magnesium hydroxide (MILK OF MAGNESIA) suspension 30 mL, 30 mL, Oral, Daily PRN, Mansy, Jan A, MD   meropenem (MERREM) 1 g in sodium chloride 0.9 % 100 mL IVPB, 1 g, Intravenous, Q12H, Hallaji, Sheema M, RPH, Last Rate: 200 mL/hr at 12/14/18 0941, 1 g at 12/14/18 0941   montelukast (SINGULAIR) tablet 10 mg, 10 mg, Oral, QHS, Tukov-Yual, Magdalene S, NP   ondansetron (ZOFRAN) tablet 4 mg, 4 mg, Oral, Q6H PRN **OR**  ondansetron (ZOFRAN) injection 4 mg, 4 mg, Intravenous, Q6H PRN, Mansy, Jan A, MD   phenylephrine (NEO-SYNEPHRINE) 10 mg in sodium chloride 0.9 % 250 mL (0.04 mg/mL) infusion, 0-400 mcg/min, Intravenous, Titrated, Tukov-Yual, Magdalene S, NP, Last Rate: 22.5 mL/hr at 12/14/18 1600, 15 mcg/min at 12/14/18 1600   rosuvastatin (CRESTOR) tablet 10 mg, 10 mg, Oral, QHS, Tukov-Yual, Magdalene S, NP   tiotropium (SPIRIVA) inhalation capsule ([facility] use ONLY) 18 mcg, 18 mcg, Inhalation, Daily, Tukov-Yual, Magdalene S, NP, 18 mcg at 12/14/18 0941   traZODone (DESYREL) tablet 25 mg, 25 mg, Oral, QHS PRN, Mansy, Jan A, MD    ALLERGIES   Codeine, Nexium [esomeprazole magnesium], Omeprazole magnesium, Oxycodone-acetaminophen, and Percocet [oxycodone-acetaminophen]    REVIEW OF SYSTEMS     10 point ROS  conducted and is negative except for discomfort due to Foley catheter  PHYSICAL EXAMINATION   Vital Signs: Temp:  [98 F (36.7 C)-100.3 F (37.9 C)] 98.6 F (37 C) (12/06 1600) Pulse Rate:  [59-137] 59 (12/06 1700) Resp:  [15-42] 23 (12/06 1700) BP: (59-139)/(30-119) 100/68 (12/06 1700) SpO2:  [79 %-98 %] 91 % (12/06 1700) Weight:  [107.4 kg] 107.4 kg (12/06 0107)  GENERAL: Age-appropriate obese HEAD: Normocephalic, atraumatic.  EYES: Pupils equal, round, reactive to light.  No scleral icterus.  MOUTH: Moist mucosal membrane. NECK: Supple. No thyromegaly. No nodules. No JVD.  PULMONARY: Clear to auscultation bilaterally CARDIOVASCULAR: S1 and S2. Regular rate and rhythm. No murmurs, rubs, or gallops.  GASTROINTESTINAL: Soft, nontender, non-distended. No masses. Positive bowel sounds. No hepatosplenomegaly.  MUSCULOSKELETAL: No swelling, clubbing, or edema.  NEUROLOGIC: Mild distress due to acute illness SKIN:intact,warm,dry   PERTINENT DATA     Infusions:  sodium chloride Stopped (12/14/18 0941)   dexmedetomidine (PRECEDEX) IV infusion Stopped (12/14/18 0617)   meropenem  (MERREM) IV 1 g (12/14/18 0941)   phenylephrine (NEO-SYNEPHRINE) Adult infusion 15 mcg/min (12/14/18 1600)   Scheduled Medications:  budesonide  0.5 mg Nebulization BID   Chlorhexidine Gluconate Cloth  6 each Topical Q0600   enoxaparin (LOVENOX) injection  40 mg Subcutaneous Q24H   montelukast  10 mg Oral QHS   rosuvastatin  10 mg Oral QHS   tiotropium  18 mcg Inhalation Daily   PRN Medications: acetaminophen **OR** acetaminophen, albuterol, magnesium hydroxide, ondansetron **OR** ondansetron (ZOFRAN) IV, traZODone Hemodynamic parameters:   Intake/Output: 12/05 0701 - 12/06 0700 In: 6096.1 [I.V.:2121.1; IV Piggyback:3974.9] Out: 755 [Urine:750; Blood:5]  Ventilator  Settings:       LAB RESULTS:  Basic Metabolic Panel: Recent Labs  Lab 12/13/18 1737 12/14/18 0429  NA 137 141  K 3.3* 3.5  CL 102 112*  CO2 19* 20*  GLUCOSE 129* 144*  BUN 30* 26*  CREATININE 1.85* 1.52*  CALCIUM 8.8* 7.4*   Liver Function Tests: Recent Labs  Lab 12/13/18 1737 12/14/18 0429  AST 53* 43*  ALT 34 29  ALKPHOS 127* 103  BILITOT 2.5* 1.4*  PROT 7.7 6.2*  ALBUMIN 3.6 2.8*   Recent Labs  Lab 12/13/18 1737  LIPASE 15   No results for input(s): AMMONIA in the last 168 hours. CBC: Recent Labs  Lab 12/13/18 1737 12/14/18 0429  WBC 22.2* 17.9*  NEUTROABS 18.9*  --   HGB 14.7 11.9*  HCT 44.6 35.9*  MCV 84.0 84.5  PLT 158 152   Cardiac Enzymes: No results for input(s): CKTOTAL, CKMB, CKMBINDEX, TROPONINI in the last 168 hours. BNP: Invalid input(s): POCBNP CBG: Recent Labs  Lab 12/13/18 2336  GLUCAP 117*     IMAGING RESULTS:  Imaging: Ct Abdomen Pelvis Wo Contrast  Result Date: 12/13/2018 CLINICAL DATA:  Altered mental status, LEFT side abdominal pain for 3 days, febrile yesterday to 103 degrees, history asthma, celiac disease, CHF, coronary artery disease post MI and coronary stenting, hypertension EXAM: CT CHEST, ABDOMEN AND PELVIS WITHOUT CONTRAST  TECHNIQUE: Multidetector CT imaging of the chest, abdomen and pelvis was performed following the standard protocol without IV contrast. Sagittal and coronal MPR images reconstructed from axial data set. Oral contrast was not administered. COMPARISON:  CT angio chest 05/21/2016, CT abdomen and pelvis 10/03/2011 FINDINGS: CT CHEST FINDINGS Cardiovascular: Aorta normal caliber without aneurysm or dissection. Atherosclerotic calcifications within coronary arteries with note of coronary stent. Heart normal size. No pericardial effusion. Mediastinum/Nodes: Base of cervical region normal  appearance. Esophagus unremarkable. No thoracic adenopathy. Lungs/Pleura: Subsegmental atelectasis at minor fissure. Lungs otherwise clear. No infiltrate, pleural effusion or pneumothorax. Musculoskeletal: Osseous structures unremarkable. CT ABDOMEN PELVIS FINDINGS Hepatobiliary: Distended gallbladder 5 cm transverse. Liver unremarkable. Pancreas: Normal appearance Spleen: Normal appearance. Small splenule anterior to spleen. Adrenals/Urinary Tract: Adrenal glands normal appearance. Large cyst upper to mid RIGHT kidney 5.7 x 5.5 cm image 64. Small parenchymal calcification versus nonobstructing calculus at upper pole RIGHT kidney. LEFT hydronephrosis secondary to a 14 x 9 mm LEFT UPJ calculus. Mild LEFT perinephric edema and LEFT renal enlargement. Additional tiny nonobstructing renal calculi. Ureters decompressed without calcification. Bladder unremarkable. Stomach/Bowel: Normal appendix. Small to moderate-sized hiatal hernia. Remainder of stomach and bowel loops unremarkable. Vascular/Lymphatic: Atherosclerotic calcifications aorta and iliac arteries without aneurysm. No adenopathy. Reproductive: Atrophic uterus and ovaries Other: No free air or free fluid. No hernia or acute inflammatory process otherwise seen. Musculoskeletal: Osseous structures unremarkable. IMPRESSION: LEFT hydronephrosis secondary to a 14 x 9 mm LEFT UPJ calculus.  Additional tiny nonobstructing renal calculi bilaterally. Small to moderate-sized hiatal hernia. Coronary artery calcifications with note of coronary stent. Distended gallbladder. Aortic Atherosclerosis (ICD10-I70.0). Electronically Signed   By: Lavonia Dana M.D.   On: 12/13/2018 19:05   Ct Chest Wo Contrast  Result Date: 12/13/2018 CLINICAL DATA:  Altered mental status, LEFT side abdominal pain for 3 days, febrile yesterday to 103 degrees, history asthma, celiac disease, CHF, coronary artery disease post MI and coronary stenting, hypertension EXAM: CT CHEST, ABDOMEN AND PELVIS WITHOUT CONTRAST TECHNIQUE: Multidetector CT imaging of the chest, abdomen and pelvis was performed following the standard protocol without IV contrast. Sagittal and coronal MPR images reconstructed from axial data set. Oral contrast was not administered. COMPARISON:  CT angio chest 05/21/2016, CT abdomen and pelvis 10/03/2011 FINDINGS: CT CHEST FINDINGS Cardiovascular: Aorta normal caliber without aneurysm or dissection. Atherosclerotic calcifications within coronary arteries with note of coronary stent. Heart normal size. No pericardial effusion. Mediastinum/Nodes: Base of cervical region normal appearance. Esophagus unremarkable. No thoracic adenopathy. Lungs/Pleura: Subsegmental atelectasis at minor fissure. Lungs otherwise clear. No infiltrate, pleural effusion or pneumothorax. Musculoskeletal: Osseous structures unremarkable. CT ABDOMEN PELVIS FINDINGS Hepatobiliary: Distended gallbladder 5 cm transverse. Liver unremarkable. Pancreas: Normal appearance Spleen: Normal appearance. Small splenule anterior to spleen. Adrenals/Urinary Tract: Adrenal glands normal appearance. Large cyst upper to mid RIGHT kidney 5.7 x 5.5 cm image 64. Small parenchymal calcification versus nonobstructing calculus at upper pole RIGHT kidney. LEFT hydronephrosis secondary to a 14 x 9 mm LEFT UPJ calculus. Mild LEFT perinephric edema and LEFT renal  enlargement. Additional tiny nonobstructing renal calculi. Ureters decompressed without calcification. Bladder unremarkable. Stomach/Bowel: Normal appendix. Small to moderate-sized hiatal hernia. Remainder of stomach and bowel loops unremarkable. Vascular/Lymphatic: Atherosclerotic calcifications aorta and iliac arteries without aneurysm. No adenopathy. Reproductive: Atrophic uterus and ovaries Other: No free air or free fluid. No hernia or acute inflammatory process otherwise seen. Musculoskeletal: Osseous structures unremarkable. IMPRESSION: LEFT hydronephrosis secondary to a 14 x 9 mm LEFT UPJ calculus. Additional tiny nonobstructing renal calculi bilaterally. Small to moderate-sized hiatal hernia. Coronary artery calcifications with note of coronary stent. Distended gallbladder. Aortic Atherosclerosis (ICD10-I70.0). Electronically Signed   By: Lavonia Dana M.D.   On: 12/13/2018 19:05   Dg Chest Port 1 View  Result Date: 12/13/2018 CLINICAL DATA:  Altered mental status. EXAM: PORTABLE CHEST 1 VIEW COMPARISON:  06/06/2018 FINDINGS: Lung volumes are low with linear opacities at the lung bases bilaterally. No signs of dense consolidation or evidence of pleural effusion. No  acute bone finding. IMPRESSION: Low lung volumes with basilar atelectasis. No consolidation or pleural effusion. Electronically Signed   By: Zetta Bills M.D.   On: 12/13/2018 18:33   Dg Or Urology Cysto Image ([facility] Only)  Result Date: 12/13/2018 There is no interpretation for this exam.  This order is for images obtained during a surgical procedure.  Please See "Surgeries" Tab for more information regarding the procedure.   [place] Ekg Site Rite  Result Date: 12/14/2018 If Site Rite image not attached, placement could not be confirmed due to current cardiac rhythm.  US Abdomen Limited Ruq  Result Date: 12/13/2018 CLINICAL DATA:  Abdominal pain, sepsis, and elevated LFTs. EXAM: ULTRASOUND ABDOMEN LIMITED RIGHT UPPER QUADRANT  COMPARISON:  None. FINDINGS: Gallbladder: The gallbladder is somewhat distended measuring up to 5.8 cm. No wall thickening, stones, sludge, or Murphy's sign. No pericholecystic fluid. Common bile duct: Diameter: 5.4 mm Liver: No focal lesion identified. Within normal limits in parenchymal echogenicity. Portal vein is patent on color Doppler imaging with normal direction of blood flow towards the liver. Other: None. IMPRESSION: 1. The gallbladder is distended to 5.8 cm with no stones, wall thickening, or Murphy's sign identified. If there is concern for acalculous cholecystitis, a HIDA scan could be performed. 2. Probable hepatic steatosis. Electronically Signed   By: Dorise Bullion III M.D   On: 12/13/2018 20:45      ASSESSMENT AND PLAN    Septic shock   -Likely due to UTI -use vasopressors to keep MAP>65-currently on Neo-Synephrine -Continue meropenem   Altered mental status with confusion -Resolved patient is currently alert and oriented x3 -Continue IV fluid hydration     Chronic grade 1 heart failure with preserved EF   -Gentle rehydration monitor fluid intake  -Strict I's and O's -History of cardiac arrest ICU telemetry monitoring  Acute renal Failure stage II-most likely due nephrolithiasis with hydronephrosis  -In context of urinary tract infection with septic shock -Nephrology on case -will consider percutaneous nephrostomy after nephrology evaluation -follow chem 7 -follow UO -continue Foley Catheter-assess need daily  ID -continue IV abx as prescibed -follow up cultures  GI/Nutrition GI PROPHYLAXIS as indicated DIET-->TF's as tolerated Constipation protocol as indicated  ENDO - ICU hypoglycemic\Hyperglycemia protocol -check FSBS per protocol   ELECTROLYTES -follow labs as needed -replace as needed -pharmacy consultation   DVT/GI PRX ordered -SCDs  TRANSFUSIONS AS NEEDED MONITOR FSBS ASSESS the need for LABS as needed   Critical care provider  statement:    Critical care time (minutes):  33   Critical care time was exclusive of:  Separately billable procedures and treating other patients   Critical care was necessary to treat or prevent imminent or life-threatening deterioration of the following conditions:   Sepsis with urinary tract infection, hydronephrosis, acute kidney injury, nephrolithiasis, multiple comorbid conditions   Critical care was time spent personally by me on the following activities:  Development of treatment plan with patient or surrogate, discussions with consultants, evaluation of patient's response to treatment, examination of patient, obtaining history from patient or surrogate, ordering and performing treatments and interventions, ordering and review of laboratory studies and re-evaluation of patient's condition.  I assumed direction of critical care for this patient from another provider in my specialty: no    This document was prepared using Dragon voice recognition software and may include unintentional dictation errors.    Ottie Glazier, M.D.  Division of [facility]

## 2018-12-14 NOTE — Progress Notes (Deleted)
IV team consult put in for midline placement. IV RN called and said she had to go to the ED, but would come after she finished there. No other communication from IV team after that.  Repeated attempts to contact IV team by this RN and the [facility] were unsuccessful.

## 2018-12-14 NOTE — Consult Note (Signed)
Pharmacy Antibiotic Note  Brenda Craig is a 58 y.o. female admitted on 12/13/2018 with Sepsis secondary to UTI.  Patient is S/P  cystoscopy ureteroscopy and left ureteral stent. Pharmacy has been consulted for meropenem dosing.  PCT >150   WBC: 22.2  Tmax: 100  Lactate 3.2   Plan: Start Meropenem 1g IV every 12 hours.   Height: 5\' 5"  (165.1 cm) Weight: 236 lb 12.4 oz (107.4 kg) IBW/kg (Calculated) : 57  Temp (24hrs), Avg:99.7 F (37.6 C), Min:98.5 F (36.9 C), Max:100.3 F (37.9 C)  Recent Labs  Lab 12/13/18 1737 12/13/18 1933 12/13/18 2313  WBC 22.2*  --   --   CREATININE 1.85*  --   --   LATICACIDVEN 2.9* 3.2* 3.4*    Estimated Creatinine Clearance: 39.9 mL/min (A) (by C-G formula based on SCr of 1.85 mg/dL (H)).    Allergies  Allergen Reactions  . Codeine Nausea And Vomiting and Rash  . Nexium [Esomeprazole Magnesium] Palpitations  . Omeprazole Magnesium Palpitations  . Oxycodone-Acetaminophen Itching, Nausea And Vomiting and Rash  . Percocet [Oxycodone-Acetaminophen] Itching, Nausea And Vomiting and Rash    Antimicrobials this admission: 12/5 Vancomycin x 1 12/5 Cefepime>> x1 12/6 Meropenem >>   Microbiology results: 12/5  BCx: pending 12/5 UCx: pending  12/5 MRSA PCR: pending 12/5 SARC Coronavirus 2: negative   Thank you for allowing pharmacy to be a part of this patient's care.  Pernell Dupre, PharmD, BCPS Clinical Pharmacist 12/14/2018 2:00 AM

## 2018-12-14 NOTE — Progress Notes (Signed)
   12/14/18 1315  Clinical Encounter Type  Visited With Patient  Visit Type Initial  Referral From Nurse  Consult/Referral To Chaplain  Spiritual Encounters  Spiritual Needs Prayer;Emotional  [facility] received page at 1315 to visit pt. Pt was talkative throughout visit. Allowed pt space to vent and provided pastoral care through active and reflective listening. Pt was tearful throughout visit. Author prayed with pt upon request. Pastoral visit was appreciated.

## 2018-12-14 NOTE — Progress Notes (Signed)
[facility] Progress Note Patient Name: Brenda Craig DOB: 04/17/1960 MRN: [id number]   Date of Service  12/14/2018  HPI/Events of Note  58 y.o. Caucasian  female with a known history of coronary artery disease, CHF, asthma and GERD, who presented to the emergency room with acute onset of altered mental status with left-sided abdominal pain with associated flank pain, mild dysuria without urinary urgency or frequency or hematuria. Abdominal pelvic CT scan revealed left hydronephrosis secondary to 14 X 9 mm left UPJ calculus with additional tiny nonobstructing renal calculi bilaterally. Patient underwent cystoscopy with left retrograde pyelogram and interpretation and insertion of left double-J stent by Dr Jeffie Pollock. BP = 81/64. PCT > 150.0. WBC + 22.2.  eICU Interventions  Spoke with Shirlee More, PCCM APP tonight, about broadening Antibiotic coverage, starting a Phenylephrine IV infusion, placing a CVL and measuring CVP.     Intervention Category Evaluation Type: New Patient Evaluation  Sommer,Steven Eugene 12/14/2018, 1:28 AM

## 2018-12-15 ENCOUNTER — Encounter: Payer: Self-pay | Admitting: Urology

## 2018-12-15 ENCOUNTER — Other Ambulatory Visit: Payer: Self-pay

## 2018-12-15 DIAGNOSIS — N390 Urinary tract infection, site not specified: Secondary | ICD-10-CM

## 2018-12-15 DIAGNOSIS — A419 Sepsis, unspecified organism: Secondary | ICD-10-CM

## 2018-12-15 LAB — URINE CULTURE: Culture: NO GROWTH

## 2018-12-15 LAB — TROPONIN I (HIGH SENSITIVITY): Troponin I (High Sensitivity): 13 ng/L (ref <18)

## 2018-12-15 LAB — COMPREHENSIVE METABOLIC PANEL
ALT: 29 U/L (ref 0–44)
AST: 41 U/L (ref 15–41)
Albumin: 2.5 g/dL — ABNORMAL LOW (ref 3.5–5.0)
Alkaline Phosphatase: 79 U/L (ref 38–126)
Anion gap: 6 (ref 5–15)
BUN: 25 mg/dL — ABNORMAL HIGH (ref 6–20)
CO2: 19 mmol/L — ABNORMAL LOW (ref 22–32)
Calcium: 7.7 mg/dL — ABNORMAL LOW (ref 8.9–10.3)
Chloride: 115 mmol/L — ABNORMAL HIGH (ref 98–111)
Creatinine, Ser: 0.99 mg/dL (ref 0.44–1.00)
GFR calc Af Amer: >60 mL/min (ref >60)
GFR calc non Af Amer: >60 mL/min (ref >60)
Glucose, Bld: 143 mg/dL — ABNORMAL HIGH (ref 70–99)
Potassium: 3.3 mmol/L — ABNORMAL LOW (ref 3.5–5.1)
Sodium: 140 mmol/L (ref 135–145)
Total Bilirubin: 0.8 mg/dL (ref 0.3–1.2)
Total Protein: 5.5 g/dL — ABNORMAL LOW (ref 6.5–8.1)

## 2018-12-15 LAB — CBC
HCT: 33.3 % — ABNORMAL LOW (ref 36.0–46.0)
Hemoglobin: 10.6 g/dL — ABNORMAL LOW (ref 12.0–15.0)
MCH: 27.5 pg (ref 26.0–34.0)
MCHC: 31.8 g/dL (ref 30.0–36.0)
MCV: 86.5 fL (ref 80.0–100.0)
Platelets: 94 10*3/uL — ABNORMAL LOW (ref 150–400)
RBC: 3.85 MIL/uL — ABNORMAL LOW (ref 3.87–5.11)
RDW: 16.3 % — ABNORMAL HIGH (ref 11.5–15.5)
WBC: 9.8 10*3/uL (ref 4.0–10.5)
nRBC: 0 % (ref 0.0–0.2)

## 2018-12-15 LAB — MAGNESIUM: Magnesium: 1.9 mg/dL (ref 1.7–2.4)

## 2018-12-15 LAB — PHOSPHORUS: Phosphorus: 2.6 mg/dL (ref 2.5–4.6)

## 2018-12-15 LAB — LACTIC ACID, PLASMA: Lactic Acid, Venous: 1 mmol/L (ref 0.5–1.9)

## 2018-12-15 LAB — PROCALCITONIN: Procalcitonin: 65.99 ng/mL

## 2018-12-15 MED ORDER — SODIUM CHLORIDE 0.9 % IV SOLN
1.0000 g | Freq: Three times a day (TID) | INTRAVENOUS | Status: DC
  Administered 2018-12-15: 1 g via INTRAVENOUS
  Filled 2018-12-15 (×3): qty 1

## 2018-12-15 MED ORDER — VANCOMYCIN HCL 1.25 G IV SOLR
1250.0000 mg | INTRAVENOUS | Status: DC
  Filled 2018-12-15: qty 1250

## 2018-12-15 MED ORDER — MAGNESIUM SULFATE IN D5W 1-5 GM/100ML-% IV SOLN
1.0000 g | Freq: Once | INTRAVENOUS | Status: AC
  Administered 2018-12-16: 1 g via INTRAVENOUS
  Filled 2018-12-15 (×2): qty 100

## 2018-12-15 MED ORDER — ASPIRIN EC 81 MG PO TBEC
81.0000 mg | DELAYED_RELEASE_TABLET | Freq: Every day | ORAL | Status: DC
  Administered 2018-12-15 – 2018-12-18 (×4): 81 mg via ORAL
  Filled 2018-12-15 (×4): qty 1

## 2018-12-15 MED ORDER — VANCOMYCIN HCL 10 G IV SOLR
2000.0000 mg | Freq: Once | INTRAVENOUS | Status: AC
  Administered 2018-12-15: 2000 mg via INTRAVENOUS
  Filled 2018-12-15: qty 2000

## 2018-12-15 MED ORDER — SODIUM CHLORIDE 0.9 % IV SOLN
2.0000 g | INTRAVENOUS | Status: DC
  Filled 2018-12-15: qty 20

## 2018-12-15 MED ORDER — POTASSIUM CHLORIDE IN NACL 20-0.9 MEQ/L-% IV SOLN
INTRAVENOUS | Status: DC
  Administered 2018-12-15 – 2018-12-17 (×3): via INTRAVENOUS
  Filled 2018-12-15 (×8): qty 1000

## 2018-12-15 MED ORDER — LORAZEPAM 0.5 MG PO TABS
0.5000 mg | ORAL_TABLET | Freq: Three times a day (TID) | ORAL | Status: DC | PRN
  Administered 2018-12-15 – 2018-12-18 (×6): 0.5 mg via ORAL
  Filled 2018-12-15 (×7): qty 1

## 2018-12-15 MED ORDER — ALBUMIN HUMAN 25 % IV SOLN
25.0000 g | Freq: Once | INTRAVENOUS | Status: AC
  Administered 2018-12-15: 25 g via INTRAVENOUS
  Filled 2018-12-15: qty 50

## 2018-12-15 MED ORDER — SODIUM CHLORIDE 0.9 % IV BOLUS
1000.0000 mL | Freq: Once | INTRAVENOUS | Status: AC
  Administered 2018-12-15: 1000 mL via INTRAVENOUS

## 2018-12-15 MED ORDER — PNEUMOCOCCAL VAC POLYVALENT 25 MCG/0.5ML IJ INJ
0.5000 mL | INJECTION | INTRAMUSCULAR | Status: AC
  Administered 2018-12-16: 0.5 mL via INTRAMUSCULAR
  Filled 2018-12-15: qty 0.5

## 2018-12-15 MED ORDER — TICAGRELOR 60 MG PO TABS
60.0000 mg | ORAL_TABLET | Freq: Two times a day (BID) | ORAL | Status: DC
  Administered 2018-12-15 – 2018-12-18 (×7): 60 mg via ORAL
  Filled 2018-12-15 (×11): qty 1

## 2018-12-15 MED ORDER — LORAZEPAM 1 MG PO TABS
0.5000 mg | ORAL_TABLET | Freq: Once | ORAL | Status: AC
  Administered 2018-12-15: 0.5 mg via ORAL
  Filled 2018-12-15: qty 1

## 2018-12-15 NOTE — Care Management (Signed)
This is a no charge note  PCCM pick up   58 yo F, admitted to ICU due to septic shock secondary to UTI and AMS. Pt is off vasopressors, stablized and is still on meropenem. Also has chronic grade 1 heart failure with preserved EF. 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 Jeffie Pollock 12/5.   Ivor Costa, MD  [facility]   If 7PM-7AM, please contact night-coverage [email or web address] Password [email or web address] 12/15/2018, 10:17 AM

## 2018-12-15 NOTE — Progress Notes (Signed)
Follow up - Critical Care Medicine Note  Patient Details:    Brenda Craig is an 58 y.o. female.  Lines, Airways, Drains: Urethral Catheter Dr. Jeffie Pollock Double-lumen 16 Fr. (Active)  Indication for Insertion or Continuance of Catheter Bladder outlet obstruction / other urologic reason 12/15/18 0800  Site Assessment Clean;Intact 12/15/18 0800  Catheter Maintenance Bag below level of bladder;Drainage bag/tubing not touching floor 12/15/18 0800  Collection Container Standard drainage bag 12/15/18 0800  Securement Method Securing device (Describe) 12/15/18 0800  Urinary Catheter Interventions (if applicable) Unclamped 76/73/41 0800  Output (mL) 300 mL 12/15/18 1403     Ureteral Drain/Stent Left ureter 6 Fr. (Active)  Site Assessment Clean;Intact 12/15/18 0800    Anti-infectives:  Anti-infectives (From admission, onward)   Start     Dose/Rate Route Frequency Ordered Stop   12/16/18 0600  vancomycin (VANCOCIN) 1,250 mg in sodium chloride 0.9 % 250 mL IVPB     1,250 mg 166.7 mL/hr over 90 Minutes Intravenous Every 24 hours 12/15/18 1149     12/15/18 1800  cefTRIAXone (ROCEPHIN) 2 g in sodium chloride 0.9 % 100 mL IVPB  Status:  Discontinued     2 g 200 mL/hr over 30 Minutes Intravenous Every 24 hours 12/15/18 1123 12/15/18 1141   12/15/18 1400  vancomycin (VANCOCIN) 2,000 mg in sodium chloride 0.9 % 500 mL IVPB     2,000 mg 250 mL/hr over 120 Minutes Intravenous  Once 12/15/18 1149     12/15/18 0900  meropenem (MERREM) 1 g in sodium chloride 0.9 % 100 mL IVPB  Status:  Discontinued     1 g 200 mL/hr over 30 Minutes Intravenous Every 8 hours 12/15/18 0729 12/15/18 1123   12/15/18 0600  cefTRIAXone (ROCEPHIN) 1 g in sodium chloride 0.9 % 100 mL IVPB  Status:  Discontinued     1 g 200 mL/hr over 30 Minutes Intravenous Every 24 hours 12/14/18 0003 12/14/18 0004   12/14/18 0600  ceFEPIme (MAXIPIME) 2 g in sodium chloride 0.9 % 100 mL IVPB  Status:  Discontinued     2 g 200 mL/hr over 30  Minutes Intravenous Every 12 hours 12/13/18 2340 12/13/18 2355   12/14/18 0600  cefTRIAXone (ROCEPHIN) 1 g in sodium chloride 0.9 % 100 mL IVPB  Status:  Discontinued     1 g 200 mL/hr over 30 Minutes Intravenous Every 24 hours 12/14/18 0004 12/14/18 0152   12/14/18 0215  meropenem (MERREM) 1 g in sodium chloride 0.9 % 100 mL IVPB  Status:  Discontinued     1 g 200 mL/hr over 30 Minutes Intravenous Every 12 hours 12/14/18 0201 12/15/18 0729   12/13/18 2315  ceFEPIme (MAXIPIME) 2 g in sodium chloride 0.9 % 100 mL IVPB  Status:  Discontinued     2 g 200 mL/hr over 30 Minutes Intravenous  Once 12/13/18 2307 12/13/18 2309   12/13/18 2145  sodium chloride 0.9 % with cefOXitin (MEFOXIN) ADS Med    Note to Pharmacy: Lawerance Sabal   : cabinet override      12/13/18 2145 12/14/18 0959   12/13/18 1800  vancomycin (VANCOCIN) 1,500 mg in sodium chloride 0.9 % 500 mL IVPB     1,500 mg 250 mL/hr over 120 Minutes Intravenous  Once 12/13/18 1748 12/13/18 2159   12/13/18 1745  ceFEPIme (MAXIPIME) 2 g in sodium chloride 0.9 % 100 mL IVPB     2 g 200 mL/hr over 30 Minutes Intravenous  Once 12/13/18 1740 12/13/18 1826   12/13/18  1745  metroNIDAZOLE (FLAGYL) IVPB 500 mg     500 mg 100 mL/hr over 60 Minutes Intravenous  Once 12/13/18 1740 12/13/18 2112   12/13/18 1745  vancomycin (VANCOCIN) IVPB 1000 mg/200 mL premix     1,000 mg 200 mL/hr over 60 Minutes Intravenous  Once 12/13/18 1740 12/13/18 1904      Microbiology: Results for orders placed or performed during the hospital encounter of 12/13/18  Blood Culture (routine x 2)     Status: None (Preliminary result)   Collection Time: 12/13/18  5:37 PM   Specimen: BLOOD  Result Value Ref Range Status   Specimen Description BLOOD BLOOD RIGHT HAND  Final   Special Requests   Final    BOTTLES DRAWN AEROBIC AND ANAEROBIC Blood Culture results may not be optimal due to an inadequate volume of blood received in culture bottles   Culture   Final    NO  GROWTH 2 DAYS Performed at [facility], [address], [place], [place] [address]    Report Status PENDING  Incomplete  Blood Culture (routine x 2)     Status: None (Preliminary result)   Collection Time: 12/13/18  5:38 PM   Specimen: BLOOD  Result Value Ref Range Status   Specimen Description BLOOD BLOOD LEFT FOREARM  Final   Special Requests   Final    BOTTLES DRAWN AEROBIC AND ANAEROBIC Blood Culture adequate volume   Culture   Final    NO GROWTH 2 DAYS Performed at [facility], [address] [place], [place] [address]    Report Status PENDING  Incomplete  Urine culture     Status: Abnormal (Preliminary result)   Collection Time: 12/13/18  7:33 PM   Specimen: In/Out Cath Urine  Result Value Ref Range Status   Specimen Description   Final    IN/OUT CATH URINE Performed at [facility], [address], [place], [place] [address]    Special Requests   Final    NONE Performed at [facility], [address], [place], [place] [address]    Culture (A)  Final    20,000 COLONIES/mL ENTEROCOCCUS FAECALIS SUSCEPTIBILITIES TO FOLLOW Performed at [facility], [place] [address], [place], [place] [address]    Report Status PENDING  Incomplete  SARS Coronavirus 2 by RT PCR (hospital order, performed in [facility] hospital lab) Nasopharyngeal Nasopharyngeal Swab     Status: None   Collection Time: 12/13/18  7:33 PM   Specimen: Nasopharyngeal Swab  Result Value Ref Range Status   SARS Coronavirus 2 NEGATIVE NEGATIVE Final    Comment: (NOTE) SARS-CoV-2 target nucleic acids are NOT DETECTED. The SARS-CoV-2 RNA is generally detectable in upper and lower respiratory specimens during the acute phase of infection. The lowest concentration of SARS-CoV-2 viral copies this assay can detect is 250 copies / mL. A negative result does not preclude SARS-CoV-2 infection and should not be used as the sole basis for treatment or other patient  management decisions.  A negative result may occur with improper specimen collection / handling, submission of specimen other than nasopharyngeal swab, presence of viral mutation(s) within the areas targeted by this assay, and inadequate number of viral copies (<250 copies / mL). A negative result must be combined with clinical observations, patient history, and epidemiological information. Fact Sheet for Patients:   StrictlyIdeas.no Fact Sheet for Healthcare Providers: BankingDealers.co.za This test is not yet approved or cleared  by the [place] FDA and has been  authorized for detection and/or diagnosis of SARS-CoV-2 by FDA under an Emergency Use Authorization (EUA).  This EUA will remain in effect (meaning this test can be used) for the duration of the COVID-19 declaration under Section 564(b)(1) of the Act, 21 U.S.C. section 360bbb-3(b)(1), unless the authorization is terminated or revoked sooner. Performed at [facility], [address] [place], [place] [address]   Urine Culture     Status: None   Collection Time: 12/13/18 10:30 PM   Specimen: PATH Other; Urine  Result Value Ref Range Status   Specimen Description   Final    URINE, CATHETERIZED Performed at [facility], [address], [place], [place] [address]    Special Requests   Final    NONE Performed at [facility], [address] [place], [place] [address]    Culture   Final    NO GROWTH Performed at [facility], [place] [address], [place], [place] [address]    Report Status 12/15/2018 FINAL  Final  MRSA PCR Screening     Status: None   Collection Time: 12/14/18 12:08 AM   Specimen: Nasopharyngeal  Result Value Ref Range Status   MRSA by PCR NEGATIVE NEGATIVE Final    Comment:        The GeneXpert MRSA Assay (FDA approved for NASAL specimens only), is one component of a comprehensive MRSA colonization surveillance  program. It is not intended to diagnose MRSA infection nor to guide or monitor treatment for MRSA infections. Performed at [facility], [address], [place], [place] [address]     Best Practice/Protocols:  VTE prophylaxis: Enoxaparin   Events:   Studies: Ct Abdomen Pelvis Wo Contrast  Result Date: 12/13/2018 CLINICAL DATA:  Altered mental status, LEFT side abdominal pain for 3 days, febrile yesterday to 103 degrees, history asthma, celiac disease, CHF, coronary artery disease post MI and coronary stenting, hypertension EXAM: CT CHEST, ABDOMEN AND PELVIS WITHOUT CONTRAST TECHNIQUE: Multidetector CT imaging of the chest, abdomen and pelvis was performed following the standard protocol without IV contrast. Sagittal and coronal MPR images reconstructed from axial data set. Oral contrast was not administered. COMPARISON:  CT angio chest 05/21/2016, CT abdomen and pelvis 10/03/2011 FINDINGS: CT CHEST FINDINGS Cardiovascular: Aorta normal caliber without aneurysm or dissection. Atherosclerotic calcifications within coronary arteries with note of coronary stent. Heart normal size. No pericardial effusion. Mediastinum/Nodes: Base of cervical region normal appearance. Esophagus unremarkable. No thoracic adenopathy. Lungs/Pleura: Subsegmental atelectasis at minor fissure. Lungs otherwise clear. No infiltrate, pleural effusion or pneumothorax. Musculoskeletal: Osseous structures unremarkable. CT ABDOMEN PELVIS FINDINGS Hepatobiliary: Distended gallbladder 5 cm transverse. Liver unremarkable. Pancreas: Normal appearance Spleen: Normal appearance. Small splenule anterior to spleen. Adrenals/Urinary Tract: Adrenal glands normal appearance. Large cyst upper to mid RIGHT kidney 5.7 x 5.5 cm image 64. Small parenchymal calcification versus nonobstructing calculus at upper pole RIGHT kidney. LEFT hydronephrosis secondary to a 14 x 9 mm LEFT UPJ calculus. Mild LEFT perinephric edema and LEFT renal  enlargement. Additional tiny nonobstructing renal calculi. Ureters decompressed without calcification. Bladder unremarkable. Stomach/Bowel: Normal appendix. Small to moderate-sized hiatal hernia. Remainder of stomach and bowel loops unremarkable. Vascular/Lymphatic: Atherosclerotic calcifications aorta and iliac arteries without aneurysm. No adenopathy. Reproductive: Atrophic uterus and ovaries Other: No free air or free fluid. No hernia or acute inflammatory process otherwise seen. Musculoskeletal: Osseous structures unremarkable. IMPRESSION: LEFT hydronephrosis secondary to a 14 x 9 mm LEFT UPJ calculus. Additional tiny nonobstructing renal calculi bilaterally. Small to moderate-sized hiatal hernia.  Coronary artery calcifications with note of coronary stent. Distended gallbladder. Aortic Atherosclerosis (ICD10-I70.0). Electronically Signed   By: Lavonia Dana M.D.   On: 12/13/2018 19:05   Ct Chest Wo Contrast  Result Date: 12/13/2018 CLINICAL DATA:  Altered mental status, LEFT side abdominal pain for 3 days, febrile yesterday to 103 degrees, history asthma, celiac disease, CHF, coronary artery disease post MI and coronary stenting, hypertension EXAM: CT CHEST, ABDOMEN AND PELVIS WITHOUT CONTRAST TECHNIQUE: Multidetector CT imaging of the chest, abdomen and pelvis was performed following the standard protocol without IV contrast. Sagittal and coronal MPR images reconstructed from axial data set. Oral contrast was not administered. COMPARISON:  CT angio chest 05/21/2016, CT abdomen and pelvis 10/03/2011 FINDINGS: CT CHEST FINDINGS Cardiovascular: Aorta normal caliber without aneurysm or dissection. Atherosclerotic calcifications within coronary arteries with note of coronary stent. Heart normal size. No pericardial effusion. Mediastinum/Nodes: Base of cervical region normal appearance. Esophagus unremarkable. No thoracic adenopathy. Lungs/Pleura: Subsegmental atelectasis at minor fissure. Lungs otherwise clear. No  infiltrate, pleural effusion or pneumothorax. Musculoskeletal: Osseous structures unremarkable. CT ABDOMEN PELVIS FINDINGS Hepatobiliary: Distended gallbladder 5 cm transverse. Liver unremarkable. Pancreas: Normal appearance Spleen: Normal appearance. Small splenule anterior to spleen. Adrenals/Urinary Tract: Adrenal glands normal appearance. Large cyst upper to mid RIGHT kidney 5.7 x 5.5 cm image 64. Small parenchymal calcification versus nonobstructing calculus at upper pole RIGHT kidney. LEFT hydronephrosis secondary to a 14 x 9 mm LEFT UPJ calculus. Mild LEFT perinephric edema and LEFT renal enlargement. Additional tiny nonobstructing renal calculi. Ureters decompressed without calcification. Bladder unremarkable. Stomach/Bowel: Normal appendix. Small to moderate-sized hiatal hernia. Remainder of stomach and bowel loops unremarkable. Vascular/Lymphatic: Atherosclerotic calcifications aorta and iliac arteries without aneurysm. No adenopathy. Reproductive: Atrophic uterus and ovaries Other: No free air or free fluid. No hernia or acute inflammatory process otherwise seen. Musculoskeletal: Osseous structures unremarkable. IMPRESSION: LEFT hydronephrosis secondary to a 14 x 9 mm LEFT UPJ calculus. Additional tiny nonobstructing renal calculi bilaterally. Small to moderate-sized hiatal hernia. Coronary artery calcifications with note of coronary stent. Distended gallbladder. Aortic Atherosclerosis (ICD10-I70.0). Electronically Signed   By: Lavonia Dana M.D.   On: 12/13/2018 19:05   Dg Chest Port 1 View  Result Date: 12/13/2018 CLINICAL DATA:  Altered mental status. EXAM: PORTABLE CHEST 1 VIEW COMPARISON:  06/06/2018 FINDINGS: Lung volumes are low with linear opacities at the lung bases bilaterally. No signs of dense consolidation or evidence of pleural effusion. No acute bone finding. IMPRESSION: Low lung volumes with basilar atelectasis. No consolidation or pleural effusion. Electronically Signed   By: Zetta Bills M.D.   On: 12/13/2018 18:33   Dg Or Urology Cysto Image ([facility] Only)  Result Date: 12/13/2018 There is no interpretation for this exam.  This order is for images obtained during a surgical procedure.  Please See "Surgeries" Tab for more information regarding the procedure.   [place] Ekg Site Rite  Result Date: 12/14/2018 If Site Rite image not attached, placement could not be confirmed due to current cardiac rhythm.  US Abdomen Limited Ruq  Result Date: 12/13/2018 CLINICAL DATA:  Abdominal pain, sepsis, and elevated LFTs. EXAM: ULTRASOUND ABDOMEN LIMITED RIGHT UPPER QUADRANT COMPARISON:  None. FINDINGS: Gallbladder: The gallbladder is somewhat distended measuring up to 5.8 cm. No wall thickening, stones, sludge, or Murphy's sign. No pericholecystic fluid. Common bile duct: Diameter: 5.4 mm Liver: No focal lesion identified. Within normal limits in parenchymal echogenicity. Portal vein is patent on color Doppler imaging with normal direction of blood flow towards the liver. Other:  None. IMPRESSION: 1. The gallbladder is distended to 5.8 cm with no stones, wall thickening, or Murphy's sign identified. If there is concern for acalculous cholecystitis, a HIDA scan could be performed. 2. Probable hepatic steatosis. Electronically Signed   By: Dorise Bullion III M.D   On: 12/13/2018 20:45    Consults: Treatment Team:  Irine Seal, MD   Subjective:    Overnight Issues: Off of pressors.  Awake and alert, no distress.  Objective:  Vital signs for last 24 hours: Temp:  [97.3 F (36.3 C)-98.7 F (37.1 C)] 98.5 F (36.9 C) (12/07 1437) Pulse Rate:  [55-93] 76 (12/07 1437) Resp:  [13-36] 20 (12/07 1437) BP: (75-136)/(53-77) 100/64 (12/07 1437) SpO2:  [89 %-97 %] 94 % (12/07 1437)  Hemodynamic parameters for last 24 hours:    Intake/Output from previous day: 12/06 0701 - 12/07 0700 In: 1673.2 [I.V.:1673.2] Out: 1950 [Urine:1950]  Intake/Output this shift: Total I/O In: 1962 [P.O.:240;  I.V.:502.9; IV Piggyback:1219.1] Out: 1125 [Urine:1125]  Vent settings for last 24 hours:    Physical Exam:   General:  Awake alert, no distress.  Appears comfortable. HEENT: PERRLA, trachea midline, no JVD Neuro: Alert and oriented X3, speech isclear, moves all extremities Cardiovascular:RRR, S1-S2, no murmur regurg or gallop, Lungs: Normal work of breathing, bilateral breath sounds without any wheezes or rhonchi Abdomen: Obese, normal bowel sounds in all 4 quadrants, palpation reveals no organomegaly Musculoskeletal: Positive range of motion, no joint deformities Skin: Warm and dry  Assessment/Plan:  Severe sepsis with septic shock Shock physiology resolved off pressors Likely etiology is urinary tract infection Continue antibiotics, these were optimized today  Toxic metabolic encephalopathy Patient mentating well today. Oriented, follows commands Suspect due to infectious process.  Chronic diastolic heart failure Monitor Strict I&O's Has not had arrhythmias  Acute renal failure Nephrolithiasis Hydronephrosis ATN Avoid nephrotoxins Volume resuscitation/support as needed Follow electrolytes and renal function  Infectious disease  Antibiotics reviewed today. Preliminary Enterococcus growth, not confirmed yet Restarted vancomycin  Patient is stable for transfer to ward today.  Discussed with hospitalist service.   LOS: 2 days   Additional comments: Does do multidisciplinary rounds.  Critical Care Total Time*:  C. Derrill Kay, MD [facility] 12/15/2018  *Care during the described time interval was provided by me and/or other providers on the critical care team.  I have reviewed this patient's available data, including medical history, events of note, physical examination and test results as part of my evaluation.

## 2018-12-15 NOTE — Progress Notes (Signed)
   12/15/18 1300  Clinical Encounter Type  Visited With Patient and family together  Visit Type Follow-up  Referral From Nurse  Consult/Referral To Chaplain  Spiritual Encounters  Spiritual Needs Brochure  Chaplain received OR for AD and visit patient. Patient family member was visiting. Patient exclaimed how she has had more visi'st today than ever. Chaplain apologize for disturbing her resting time. Chaplain told her she was visiting per her request of AD. Chaplain left documents and ask patient to have Chaplain page if she wanted to complete. Patient said her neighbor was a Therapist, occupational said that was great and she could have it completed later as well. Patient thanked Chaplain for her time.

## 2018-12-15 NOTE — Progress Notes (Signed)
Urology Inpatient Progress Note  Subjective: Brenda Craig is a 58 y.o. female admitted on 12/13/2018 with a 14 mm obstructing left UPJ stone with sepsis and AKI. PMH significant for CAD s/p coronary artery stent placement, on Brilinta.  She is POD 2 from left ureteral stent placement with Dr. Jeffie Pollock.  Creatinine downtrending, today 0.99.  WBC count also downtrending, today 9.8.   Lactate normalized, today 1.0.  Blood cultures pending, with no growth at 2 days.  Admission urine culture pending, follow-up catheterized urine culture resulted with no growth. On antibiotics as below.  She is afebrile with intermittent hypotension to the 80s/60s.  Foley catheter in place draining clear, light yellow urine.  She states she feels better today since admission.  No acute concerns.  Anti-infectives: Anti-infectives (From admission, onward)   Start     Dose/Rate Route Frequency Ordered Stop   12/15/18 0900  meropenem (MERREM) 1 g in sodium chloride 0.9 % 100 mL IVPB     1 g 200 mL/hr over 30 Minutes Intravenous Every 8 hours 12/15/18 0729     12/15/18 0600  cefTRIAXone (ROCEPHIN) 1 g in sodium chloride 0.9 % 100 mL IVPB  Status:  Discontinued     1 g 200 mL/hr over 30 Minutes Intravenous Every 24 hours 12/14/18 0003 12/14/18 0004   12/14/18 0600  ceFEPIme (MAXIPIME) 2 g in sodium chloride 0.9 % 100 mL IVPB  Status:  Discontinued     2 g 200 mL/hr over 30 Minutes Intravenous Every 12 hours 12/13/18 2340 12/13/18 2355   12/14/18 0600  cefTRIAXone (ROCEPHIN) 1 g in sodium chloride 0.9 % 100 mL IVPB  Status:  Discontinued     1 g 200 mL/hr over 30 Minutes Intravenous Every 24 hours 12/14/18 0004 12/14/18 0152   12/14/18 0215  meropenem (MERREM) 1 g in sodium chloride 0.9 % 100 mL IVPB  Status:  Discontinued     1 g 200 mL/hr over 30 Minutes Intravenous Every 12 hours 12/14/18 0201 12/15/18 0729   12/13/18 2315  ceFEPIme (MAXIPIME) 2 g in sodium chloride 0.9 % 100 mL IVPB  Status:  Discontinued      2 g 200 mL/hr over 30 Minutes Intravenous  Once 12/13/18 2307 12/13/18 2309   12/13/18 2145  sodium chloride 0.9 % with cefOXitin (MEFOXIN) ADS Med    Note to Pharmacy: Lawerance Sabal   : cabinet override      12/13/18 2145 12/14/18 0959   12/13/18 1800  vancomycin (VANCOCIN) 1,500 mg in sodium chloride 0.9 % 500 mL IVPB     1,500 mg 250 mL/hr over 120 Minutes Intravenous  Once 12/13/18 1748 12/13/18 2159   12/13/18 1745  ceFEPIme (MAXIPIME) 2 g in sodium chloride 0.9 % 100 mL IVPB     2 g 200 mL/hr over 30 Minutes Intravenous  Once 12/13/18 1740 12/13/18 1826   12/13/18 1745  metroNIDAZOLE (FLAGYL) IVPB 500 mg     500 mg 100 mL/hr over 60 Minutes Intravenous  Once 12/13/18 1740 12/13/18 2112   12/13/18 1745  vancomycin (VANCOCIN) IVPB 1000 mg/200 mL premix     1,000 mg 200 mL/hr over 60 Minutes Intravenous  Once 12/13/18 1740 12/13/18 1904      Current Facility-Administered Medications  Medication Dose Route Frequency Provider Last Rate Last Dose  . 0.9 % NaCl with KCl 20 mEq/ L  infusion   Intravenous Continuous Dallie Piles, RPH 125 mL/hr at 12/15/18 [id number]    . acetaminophen (TYLENOL) tablet 650  mg  650 mg Oral Q6H PRN Mansy, Jan A, MD   650 mg at 12/14/18 2327   Or  . acetaminophen (TYLENOL) suppository 650 mg  650 mg Rectal Q6H PRN Mansy, Jan A, MD      . albumin human 25 % solution 25 g  25 g Intravenous Once Tyler Pita, MD      . albuterol (PROVENTIL) (2.5 MG/3ML) 0.083% nebulizer solution 2.5 mg  2.5 mg Nebulization Q6H PRN Tukov-Yual, Magdalene S, NP   2.5 mg at 12/15/18 0741  . benzonatate (TESSALON) capsule 100 mg  100 mg Oral TID Tukov-Yual, Magdalene S, NP   100 mg at 12/15/18 [id number]  . budesonide (PULMICORT) nebulizer solution 0.5 mg  0.5 mg Nebulization BID Tukov-Yual, Magdalene S, NP   0.5 mg at 12/15/18 0741  . Chlorhexidine Gluconate Cloth 2 % PADS 6 each  6 each Topical Q0600 Mansy, Arvella Merles, MD   6 each at 12/15/18 0252  . dexmedetomidine (PRECEDEX) 400  MCG/100ML (4 mcg/mL) infusion  0.4-1.2 mcg/kg/hr Intravenous Titrated Tukov-Yual, Arlyss Gandy, NP   Stopped at 12/14/18 0617  . dextromethorphan (DELSYM) 30 MG/5ML liquid 30 mg  30 mg Oral BID Tukov-Yual, Magdalene S, NP   30 mg at 12/15/18 [id number]  . enoxaparin (LOVENOX) injection 40 mg  40 mg Subcutaneous Q24H Mansy, Jan A, MD   40 mg at 12/15/18 [id number]  . magnesium hydroxide (MILK OF MAGNESIA) suspension 30 mL  30 mL Oral Daily PRN Mansy, Jan A, MD      . meropenem (MERREM) 1 g in sodium chloride 0.9 % 100 mL IVPB  1 g Intravenous Q8H Dallie Piles, RPH 200 mL/hr at 12/15/18 0921 1 g at 12/15/18 0921  . montelukast (SINGULAIR) tablet 10 mg  10 mg Oral QHS Tukov-Yual, Magdalene S, NP   10 mg at 12/14/18 2147  . ondansetron (ZOFRAN) tablet 4 mg  4 mg Oral Q6H PRN Mansy, Jan A, MD       Or  . ondansetron [facility]) injection 4 mg  4 mg Intravenous Q6H PRN Mansy, Jan A, MD      . phenylephrine (NEO-SYNEPHRINE) 10 mg in sodium chloride 0.9 % 250 mL (0.04 mg/mL) infusion  0-400 mcg/min Intravenous Titrated Tukov-Yual, Arlyss Gandy, NP   Stopped at 12/15/18 0359  . [START ON 12/16/2018] pneumococcal 23 valent vaccine (PNEUMOVAX-23) injection 0.5 mL  0.5 mL Intramuscular Tomorrow-1000 Vernard Gambles L, MD      . rosuvastatin (CRESTOR) tablet 10 mg  10 mg Oral QHS Tukov-Yual, Magdalene S, NP   10 mg at 12/14/18 2147  . tiotropium (SPIRIVA) inhalation capsule ([facility] use ONLY) 18 mcg  18 mcg Inhalation Daily Tukov-Yual, Magdalene S, NP   18 mcg at 12/15/18 0921  . traZODone (DESYREL) tablet 25 mg  25 mg Oral QHS PRN Mansy, Jan A, MD       Objective: Vital signs in last 24 hours: Temp:  [97.3 F (36.3 C)-98.7 F (37.1 C)] 97.3 F (36.3 C) (12/07 0800) Pulse Rate:  [55-93] 84 (12/07 1000) Resp:  [15-37] 34 (12/07 1000) BP: (75-139)/(30-119) 75/53 (12/07 0900) SpO2:  [89 %-96 %] 94 % (12/07 1000)  Intake/Output from previous day: 12/06 0701 - 12/07 0700 In: 1673.2 [I.V.:1673.2] Out: 1950  [Urine:1950] Intake/Output this shift: Total I/O In: 499.8 [P.O.:120; IV Piggyback:379.8] Out: 200 [Urine:200]  Physical Exam Vitals signs and nursing note reviewed.  Constitutional:      General: She is not in acute distress.    Appearance: She  is not ill-appearing, toxic-appearing or diaphoretic.  HENT:     Head: Normocephalic and atraumatic.  Pulmonary:     Effort: Pulmonary effort is normal. No respiratory distress.  Skin:    General: Skin is warm and dry.  Neurological:     Mental Status: She is alert and oriented to person, place, and time.  Psychiatric:        Mood and Affect: Mood normal.        Behavior: Behavior normal.    Lab Results:  Recent Labs    12/14/18 0429 12/15/18 0706  WBC 17.9* 9.8  HGB 11.9* 10.6*  HCT 35.9* 33.3*  PLT 152 94*   BMET Recent Labs    12/14/18 0429 12/15/18 0539  NA 141 140  K 3.5 3.3*  CL 112* 115*  CO2 20* 19*  GLUCOSE 144* 143*  BUN 26* 25*  CREATININE 1.52* 0.99  CALCIUM 7.4* 7.7*   PT/INR Recent Labs    12/13/18 1737 12/14/18 0429  LABPROT 15.1 14.4  INR 1.2 1.1   Assessment & Plan: 58 year old female admitted with an obstructing 14 mm left UPJ stone with sepsis and AKI, now s/p left ureteral stent placement.  Creatinine, white count, and lactate normalized.  She has been afebrile for 24 hours.  Can discontinue Foley today.  We will plan for outpatient follow-up in our clinic to discuss definitive stone management.  She will require cardiac clearance prior to any procedure.  She is followed by Dr. Manuella Ghazi at [facility] and is on [place].  Debroah Loop, PA-C 12/15/2018

## 2018-12-15 NOTE — Consult Note (Signed)
Pharmacy Antibiotic Note  Brenda Craig is a 58 y.o. female admitted on 12/13/2018 with Sepsis secondary to UTI.  Patient is S/P  cystoscopy ureteroscopy and left ureteral stent and growing Enterococcus in her urine. Pharmacy has been consulted for vancomycin dosing. She has no h/o VRE but we are starting vancomycin until sensitivities have returned.  Plan: Vancomycin 1250 mg IV Q 24 hrs following a 2000 mg loading dose  Goal AUC 400-550 T1/2 13.8h BMI 39 Expected AUC: 466 SCr used: 0.99   Height: 5\' 5"  (165.1 cm) Weight: 236 lb 12.4 oz (107.4 kg) IBW/kg (Calculated) : 57  Temp (24hrs), Avg:98.2 F (36.8 C), Min:97.3 F (36.3 C), Max:98.7 F (37.1 C)  Recent Labs  Lab 12/13/18 1737 12/13/18 1933 12/13/18 2313 12/14/18 0429 12/15/18 0539 12/15/18 0706  WBC 22.2*  --   --  17.9*  --  9.8  CREATININE 1.85*  --   --  1.52* 0.99  --   LATICACIDVEN 2.9* 3.2* 3.4* 1.5 1.0  --     Estimated Creatinine Clearance: 74.6 mL/min (by C-G formula based on SCr of 0.99 mg/dL).    Antimicrobials this admission: 12/5 Vancomycin x 1 12/5 Cefepime>> x1 12/6 Meropenem >> 12/7 12/7 vancomycin >>  Microbiology results: 12/5  BCx: NGTD 12/5 UCx: E faecalis 12/5 MRSA PCR: pending 12/5 SARS Coronavirus 2: negative   Thank you for allowing pharmacy to be a part of this patient's care.  Dallie Piles, PharmD, BCPS Clinical Pharmacist 12/15/2018 11:41 AM

## 2018-12-15 NOTE — Progress Notes (Signed)
   12/15/18 1000  Clinical Encounter Type  Visited With Patient not available  Visit Type Initial  Referral From Nurse  Consult/Referral To Chaplain  Spiritual Encounters  Spiritual Needs Brochure  Chaplain received OR for AD and tried to visit. Patient was on phone. Chaplain will follow up later.

## 2018-12-15 NOTE — Consult Note (Signed)
[facility] for Electrolyte Monitoring and Replacement   Recent Labs: Potassium (mmol/L)  Date Value  12/15/2018 3.3 (L)  06/10/2013 3.7   Magnesium (mg/dL)  Date Value  12/15/2018 1.9  05/25/2011 1.7 (L)   Calcium (mg/dL)  Date Value  12/15/2018 7.7 (L)   Calcium, Total (mg/dL)  Date Value  06/10/2013 8.9   Albumin (g/dL)  Date Value  12/15/2018 2.5 (L)  05/25/2011 3.5   Phosphorus (mg/dL)  Date Value  12/15/2018 2.6   Sodium (mmol/L)  Date Value  12/15/2018 140  06/10/2013 143   Corrected Ca: 8.9 mg/dL  Assessment: 58 y.o. female admitted on 12/13/2018 with a 14 mm obstructing left UPJ stone with sepsis. PMH significant for CAD s/p coronary artery stent placement, on Brilinta.  She is POD 2 from left ureteral stent placement with Dr. Jeffie Pollock. She is being administered IVMF NS at 125 mL/hr  Goal of Therapy Given Cardiac History: Potassium 4.0 - 5.1 mmol/L Magnesium 2.0 - 2.4 mg/dL Other Electrolytes WNL  Plan:   Add 20 mEq/L KCl to IVMF NS and continue at 125 mL/hr  Replace magnesium with 1 gram IV magnesium sulfate  BMP, magnesium in am  Dallie Piles ,PharmD Clinical Pharmacist 12/15/2018 11:29 AM

## 2018-12-16 DIAGNOSIS — R6520 Severe sepsis without septic shock: Secondary | ICD-10-CM

## 2018-12-16 LAB — BASIC METABOLIC PANEL
Anion gap: 11 (ref 5–15)
BUN: 18 mg/dL (ref 6–20)
CO2: 19 mmol/L — ABNORMAL LOW (ref 22–32)
Calcium: 8.1 mg/dL — ABNORMAL LOW (ref 8.9–10.3)
Chloride: 115 mmol/L — ABNORMAL HIGH (ref 98–111)
Creatinine, Ser: 0.82 mg/dL (ref 0.44–1.00)
GFR calc Af Amer: >60 mL/min (ref >60)
GFR calc non Af Amer: >60 mL/min (ref >60)
Glucose, Bld: 107 mg/dL — ABNORMAL HIGH (ref 70–99)
Potassium: 3 mmol/L — ABNORMAL LOW (ref 3.5–5.1)
Sodium: 145 mmol/L (ref 135–145)

## 2018-12-16 LAB — BLOOD CULTURE ID PANEL (REFLEXED)

## 2018-12-16 LAB — URINE CULTURE: Culture: 20000 — AB

## 2018-12-16 LAB — MAGNESIUM: Magnesium: 1.9 mg/dL (ref 1.7–2.4)

## 2018-12-16 MED ORDER — KETOROLAC TROMETHAMINE 15 MG/ML IJ SOLN
15.0000 mg | Freq: Four times a day (QID) | INTRAMUSCULAR | Status: DC | PRN
  Administered 2018-12-16 – 2018-12-18 (×5): 15 mg via INTRAVENOUS
  Filled 2018-12-16 (×5): qty 1

## 2018-12-16 MED ORDER — SALINE SPRAY 0.65 % NA SOLN
1.0000 | NASAL | Status: DC | PRN
  Filled 2018-12-16: qty 44

## 2018-12-16 MED ORDER — OXYBUTYNIN CHLORIDE 5 MG PO TABS
5.0000 mg | ORAL_TABLET | Freq: Three times a day (TID) | ORAL | Status: DC
  Administered 2018-12-16 – 2018-12-18 (×7): 5 mg via ORAL
  Filled 2018-12-16 (×10): qty 1

## 2018-12-16 MED ORDER — ENSURE ENLIVE PO LIQD
237.0000 mL | Freq: Two times a day (BID) | ORAL | Status: DC
  Administered 2018-12-16: 237 mL via ORAL

## 2018-12-16 MED ORDER — ADULT MULTIVITAMIN W/MINERALS CH
1.0000 | ORAL_TABLET | Freq: Every day | ORAL | Status: DC
  Administered 2018-12-17: 1 via ORAL
  Filled 2018-12-16 (×2): qty 1

## 2018-12-16 MED ORDER — SODIUM CHLORIDE 0.9 % IV SOLN
1.0000 g | Freq: Three times a day (TID) | INTRAVENOUS | Status: DC
  Administered 2018-12-16 – 2018-12-18 (×8): 1 g via INTRAVENOUS
  Filled 2018-12-16 (×10): qty 1

## 2018-12-16 MED ORDER — MAGNESIUM SULFATE IN D5W 1-5 GM/100ML-% IV SOLN
1.0000 g | Freq: Once | INTRAVENOUS | Status: AC
  Administered 2018-12-16: 1 g via INTRAVENOUS
  Filled 2018-12-16: qty 100

## 2018-12-16 MED ORDER — POTASSIUM CHLORIDE CRYS ER 20 MEQ PO TBCR: 40.0000 meq | EXTENDED_RELEASE_TABLET | Freq: Once | ORAL | Status: DC

## 2018-12-16 MED ORDER — SIMETHICONE 80 MG PO CHEW
80.0000 mg | CHEWABLE_TABLET | Freq: Four times a day (QID) | ORAL | Status: DC | PRN
  Administered 2018-12-16 – 2018-12-17 (×2): 80 mg via ORAL
  Filled 2018-12-16 (×3): qty 1

## 2018-12-16 MED ORDER — POTASSIUM CHLORIDE CRYS ER 20 MEQ PO TBCR
40.0000 meq | EXTENDED_RELEASE_TABLET | ORAL | Status: AC
  Administered 2018-12-16 (×2): 40 meq via ORAL
  Filled 2018-12-16 (×2): qty 2

## 2018-12-16 NOTE — Progress Notes (Signed)
PROGRESS NOTE    Brenda Craig  [id number] DOB: 12/05/1960 DOA: 12/13/2018  PCP: Albina Billet, MD    LOS - 3   Brief Narrative:   She has a history of sleep apnea and uses CPAP at home.  Subjective @TODAY @: 58 y.o. Caucasian  female with a known history of coronary artery disease, CHF, asthma and GERD, who presented to the emergency room with acute onset of altered mental status with left-sided abdominal pain with associated flank pain, mild dysuria without urinary urgency or frequency or hematuria.  Associated fever and chills, nausea and vomiting, mild dyspnea.  T-max 103.  In the ED, HR 128, RR 28, Temp 99.3 and spO2 94% on room air, normotensive.  Labs revealed lactic acid of 2.9 INR 3.4 with procalcitonin more than 150, leukocytosis of 22.2 with neutrophilia.  Urinalysis was strongly positive for UTI.  Abdominal pelvic CT scan revealed left hydronephrosis secondary to 14 X 9 mm left UPJ calculus with additional tiny nonobstructing renal calculi bilaterally and small to moderate sized hiatal hernia, coronary artery calcifications with note of coronary stent and distended gallbladder as well as aortic atherosclerosis and subsegmental atelectasis with clear lungs otherwise.  Treated with Cefepine and Vanco in the ED, fluid resucitated and urology consulted.  Admitted to ICU with septic shock.   Underwent ureteroscopy and R ureteral stent was placed.  Now hemodynamically stable and transferred to med/surg bed.  Is on meropenem pending final urine culture data.  Urology following  Assessment & Plan:   Active Problems:   Severe sepsis (HCC)   Septic shock (HCC)   AKI (acute kidney injury) ([place])   Obstruction of left ureter   Severe sepsis with septic shock Shock resolved, did require pressors and ICU care.  Now hemodynamically stable.   Likely etiology is urinary tract infection  --continue meropenem --follow urine cultures for susceptibilities -- PT/OT  Toxic metabolic  encephalopathy Resolved.  Oriented, follows commands Suspect AMS was due to infection.  Chronic diastolic heart failure - compensated -- Strict I&O's  Acute renal failure - resolved Nephrolithiasis Hydronephrosis ATN likely due to septic shock with hypotension requiring pressors.   Status post R ureteral stent placement 12/5. --Urology following --Avoid nephrotoxins --monitor electrolytes and renal function  Urinary Tract Infection Urine culture prelim shows Enterococcus  --continue meropenem --follow up culture susceptibilties   DVT prophylaxis: Lovenox   Code Status: Full Code  Family Communication: none at bedside Disposition Plan:  Pending urology clearance, urine culture data.  Expect 1-3 more days.   Consultants:   Urology  Procedures:  Ureteroscopy with right ureteral stent placement  Antimicrobials:   Meropenem    Objective: Vitals:   12/15/18 2021 12/16/18 0017 12/16/18 0729 12/16/18 0804  BP:  115/79  129/82  Pulse:  82  80  Resp:  (!) 22  17  Temp:  97.8 F (36.6 C)  97.8 F (36.6 C)  TempSrc:  Oral  Oral  SpO2: 92% 92% 93% 93%  Weight:      Height:        Intake/Output Summary (Last 24 hours) at 12/16/2018 0824 Last data filed at 12/16/2018 0804 Gross per 24 hour  Intake 2641.97 ml  Output 3575 ml  Net -933.03 ml   Filed Weights   12/13/18 1745 12/14/18 0107  Weight: 107 kg 107.4 kg    Examination:  General exam: awake, alert, no acute distress, obese Respiratory system: decreased breath sounds but clear, intermittently takes very shallow inspirations, no wheezes, rales  or rhonchi, normal respiratory effort. On 3 L/min O2. Cardiovascular system: normal S1/S2,  RRR, no JVD, murmurs, rubs, gallops, nonpitting lower extremity edema bilaterally.   Gastrointestinal system: soft, non-tender, non-distended abdomen, normal bowel sounds. Central nervous system: alert and oriented x4. no gross focal neurologic deficits, normal speech Skin:  dry, intact, normal temperature Psychiatry: normal mood, congruent affect, judgement and insight appear normal, no apparent hallucinations or delusions    Data Reviewed: I have personally reviewed following labs and imaging studies  CBC: Recent Labs  Lab 12/13/18 1737 12/14/18 0429 12/15/18 0706  WBC 22.2* 17.9* 9.8  NEUTROABS 18.9*  --   --   HGB 14.7 11.9* 10.6*  HCT 44.6 35.9* 33.3*  MCV 84.0 84.5 86.5  PLT 158 152 94*   Basic Metabolic Panel: Recent Labs  Lab 12/13/18 1737 12/14/18 0429 12/15/18 0539 12/16/18 0529  NA 137 141 140 145  K 3.3* 3.5 3.3* 3.0*  CL 102 112* 115* 115*  CO2 19* 20* 19* 19*  GLUCOSE 129* 144* 143* 107*  BUN 30* 26* 25* 18  CREATININE 1.85* 1.52* 0.99 0.82  CALCIUM 8.8* 7.4* 7.7* 8.1*  MG  --   --  1.9 1.9  PHOS  --   --  2.6  --    GFR: Estimated Creatinine Clearance: 90 mL/min (by C-G formula based on SCr of 0.82 mg/dL). Liver Function Tests: Recent Labs  Lab 12/13/18 1737 12/14/18 0429 12/15/18 0539  AST 53* 43* 41  ALT 34 29 29  ALKPHOS 127* 103 79  BILITOT 2.5* 1.4* 0.8  PROT 7.7 6.2* 5.5*  ALBUMIN 3.6 2.8* 2.5*   Recent Labs  Lab 12/13/18 1737  LIPASE 15   No results for input(s): AMMONIA in the last 168 hours. Coagulation Profile: Recent Labs  Lab 12/13/18 1737 12/14/18 0429  INR 1.2 1.1   Cardiac Enzymes: No results for input(s): CKTOTAL, CKMB, CKMBINDEX, TROPONINI in the last 168 hours. BNP (last 3 results) No results for input(s): PROBNP in the last 8760 hours. HbA1C: No results for input(s): HGBA1C in the last 72 hours. CBG: Recent Labs  Lab 12/13/18 2336  GLUCAP 117*   Lipid Profile: No results for input(s): CHOL, HDL, LDLCALC, TRIG, CHOLHDL, LDLDIRECT in the last 72 hours. Thyroid Function Tests: No results for input(s): TSH, T4TOTAL, FREET4, T3FREE, THYROIDAB in the last 72 hours. Anemia Panel: No results for input(s): VITAMINB12, FOLATE, FERRITIN, TIBC, IRON, RETICCTPCT in the last 72 hours.  Sepsis Labs: Recent Labs  Lab 12/13/18 1737 12/13/18 1933 12/13/18 2313 12/14/18 0429 12/15/18 0539  PROCALCITON >150.00  --   --  125.07 65.99  LATICACIDVEN 2.9* 3.2* 3.4* 1.5 1.0    Recent Results (from the past 240 hour(s))  Blood Culture (routine x 2)     Status: None (Preliminary result)   Collection Time: 12/13/18  5:37 PM   Specimen: BLOOD  Result Value Ref Range Status   Specimen Description BLOOD BLOOD RIGHT HAND  Final   Special Requests   Final    BOTTLES DRAWN AEROBIC AND ANAEROBIC Blood Culture results may not be optimal due to an inadequate volume of blood received in culture bottles   Culture   Final    NO GROWTH 3 DAYS Performed at [facility], [address] [place], [place] [address]    Report Status PENDING  Incomplete  Blood Culture (routine x 2)     Status: None (Preliminary result)   Collection Time: 12/13/18  5:38 PM   Specimen: BLOOD  Result Value Ref Range Status   Specimen Description   Final    BLOOD BLOOD LEFT FOREARM Performed at [facility], [address] [place], [place] [address]    Special Requests   Final    BOTTLES DRAWN AEROBIC AND ANAEROBIC Blood Culture adequate volume Performed at [facility], [address] [place], [place] [address]    Culture  Setup Time   Final    GRAM NEGATIVE RODS AEROBIC BOTTLE ONLY CRITICAL RESULT CALLED TO, READ BACK BY AND VERIFIED WITH: Hart Robinsons PHARMD 0013 12/16/2018 HNM    Culture   Final    NO GROWTH 3 DAYS Performed at [facility], [address] [place], [place] [address]    Report Status PENDING  Incomplete  Blood Culture ID Panel (Reflexed)     Status: Abnormal   Collection Time: 12/13/18  5:38 PM  Result Value Ref Range Status   Enterococcus species NOT DETECTED NOT DETECTED Final   Listeria monocytogenes NOT DETECTED NOT DETECTED Final   Staphylococcus species NOT DETECTED NOT DETECTED Final   Staphylococcus aureus (BCID) NOT DETECTED NOT  DETECTED Final   Streptococcus species NOT DETECTED NOT DETECTED Final   Streptococcus agalactiae NOT DETECTED NOT DETECTED Final   Streptococcus pneumoniae NOT DETECTED NOT DETECTED Final   Streptococcus pyogenes NOT DETECTED NOT DETECTED Final   Acinetobacter baumannii NOT DETECTED NOT DETECTED Final   Enterobacteriaceae species DETECTED (A) NOT DETECTED Final    Comment: Enterobacteriaceae represent a large family of gram-negative bacteria, not a single organism. CRITICAL RESULT CALLED TO, READ BACK BY AND VERIFIED WITH: SCOTT HALL PHARMD 0013 12/16/2018 HNM    Enterobacter cloacae complex NOT DETECTED NOT DETECTED Final   Escherichia coli DETECTED (A) NOT DETECTED Final    Comment: CRITICAL RESULT CALLED TO, READ BACK BY AND VERIFIED WITH: SCOTT HALL PHARMD 0013 12/16/2018 HNM    Klebsiella oxytoca NOT DETECTED NOT DETECTED Final   Klebsiella pneumoniae NOT DETECTED NOT DETECTED Final   Proteus species NOT DETECTED NOT DETECTED Final   Serratia marcescens NOT DETECTED NOT DETECTED Final   Carbapenem resistance NOT DETECTED NOT DETECTED Final   Haemophilus influenzae NOT DETECTED NOT DETECTED Final   Neisseria meningitidis NOT DETECTED NOT DETECTED Final   Pseudomonas aeruginosa NOT DETECTED NOT DETECTED Final   Candida albicans NOT DETECTED NOT DETECTED Final   Candida glabrata NOT DETECTED NOT DETECTED Final   Candida krusei NOT DETECTED NOT DETECTED Final   Candida parapsilosis NOT DETECTED NOT DETECTED Final   Candida tropicalis NOT DETECTED NOT DETECTED Final    Comment: Performed at [facility], [address], [place], [place] [address]  Urine culture     Status: Abnormal   Collection Time: 12/13/18  7:33 PM   Specimen: In/Out Cath Urine  Result Value Ref Range Status   Specimen Description   Final    IN/OUT CATH URINE Performed at [facility], [address], [place], [place] [address]    Special Requests   Final    NONE Performed at [facility], [address] [place], [place] [address]    Culture 20,000 COLONIES/mL ENTEROCOCCUS FAECALIS (A)  Final   Report Status 12/16/2018 FINAL  Final   Organism ID, Bacteria ENTEROCOCCUS FAECALIS (A)  Final      Susceptibility   Enterococcus faecalis - MIC*    AMPICILLIN <=2 SENSITIVE Sensitive     LEVOFLOXACIN 2 SENSITIVE Sensitive     NITROFURANTOIN <=16 SENSITIVE Sensitive  VANCOMYCIN 1 SENSITIVE Sensitive     * 20,000 COLONIES/mL ENTEROCOCCUS FAECALIS  SARS Coronavirus 2 by RT PCR (hospital order, performed in [facility] hospital lab) Nasopharyngeal Nasopharyngeal Swab     Status: None   Collection Time: 12/13/18  7:33 PM   Specimen: Nasopharyngeal Swab  Result Value Ref Range Status   SARS Coronavirus 2 NEGATIVE NEGATIVE Final    Comment: (NOTE) SARS-CoV-2 target nucleic acids are NOT DETECTED. The SARS-CoV-2 RNA is generally detectable in upper and lower respiratory specimens during the acute phase of infection. The lowest concentration of SARS-CoV-2 viral copies this assay can detect is 250 copies / mL. A negative result does not preclude SARS-CoV-2 infection and should not be used as the sole basis for treatment or other patient management decisions.  A negative result may occur with improper specimen collection / handling, submission of specimen other than nasopharyngeal swab, presence of viral mutation(s) within the areas targeted by this assay, and inadequate number of viral copies (<250 copies / mL). A negative result must be combined with clinical observations, patient history, and epidemiological information. Fact Sheet for Patients:   StrictlyIdeas.no Fact Sheet for Healthcare Providers: BankingDealers.co.za This test is not yet approved or cleared  by the [place] FDA and has been authorized for detection and/or diagnosis of SARS-CoV-2 by FDA under an Emergency Use Authorization (EUA).  This EUA  will remain in effect (meaning this test can be used) for the duration of the COVID-19 declaration under Section 564(b)(1) of the Act, 21 U.S.C. section 360bbb-3(b)(1), unless the authorization is terminated or revoked sooner. Performed at [facility], [address], [place], [place] [address]   Urine Culture     Status: None   Collection Time: 12/13/18 10:30 PM   Specimen: PATH Other; Urine  Result Value Ref Range Status   Specimen Description   Final    URINE, CATHETERIZED Performed at [facility], [address], [place], [place] [address]    Special Requests   Final    NONE Performed at [facility], [address] [place], [place] [address]    Culture   Final    NO GROWTH Performed at [facility], [place] [address] [place], [place] [address]    Report Status 12/15/2018 FINAL  Final  MRSA PCR Screening     Status: None   Collection Time: 12/14/18 12:08 AM   Specimen: Nasopharyngeal  Result Value Ref Range Status   MRSA by PCR NEGATIVE NEGATIVE Final    Comment:        The GeneXpert MRSA Assay (FDA approved for NASAL specimens only), is one component of a comprehensive MRSA colonization surveillance program. It is not intended to diagnose MRSA infection nor to guide or monitor treatment for MRSA infections. Performed at [facility], [address] [place],  [address]          Radiology Studies: No results found.      Scheduled Meds: . aspirin EC  81 mg Oral Daily  . benzonatate  100 mg Oral TID  . budesonide  0.5 mg Nebulization BID  . dextromethorphan  30 mg Oral BID  . enoxaparin (LOVENOX) injection  40 mg Subcutaneous Q24H  . montelukast  10 mg Oral QHS  . pneumococcal 23 valent vaccine  0.5 mL Intramuscular Tomorrow-1000  . potassium chloride  40 mEq Oral Q4H  . rosuvastatin  10 mg Oral QHS  . ticagrelor  60 mg Oral BID  . tiotropium  18 mcg Inhalation Daily   Continuous Infusions: .  0.9 % NaCl with KCl 20 mEq / L 75 mL/hr at 12/16/18 [id number]  . magnesium sulfate bolus IVPB    . meropenem (MERREM) IV 1 g (12/16/18 0325)     LOS: 3 days    Time spent: 35-40 minutes    Ezekiel Slocumb, DO [facility] Pager: [phone]  If 7PM-7AM, please contact night-coverage [email or web address] Password [facility] 12/16/2018, 8:24 AM

## 2018-12-16 NOTE — Consult Note (Signed)
[place] for Electrolyte Monitoring and Replacement   Recent Labs: Potassium (mmol/L)  Date Value  12/16/2018 3.0 (L)  06/10/2013 3.7   Magnesium (mg/dL)  Date Value  12/16/2018 1.9  05/25/2011 1.7 (L)   Calcium (mg/dL)  Date Value  12/16/2018 8.1 (L)   Calcium, Total (mg/dL)  Date Value  06/10/2013 8.9   Albumin (g/dL)  Date Value  12/15/2018 2.5 (L)  05/25/2011 3.5   Phosphorus (mg/dL)  Date Value  12/15/2018 2.6   Sodium (mmol/L)  Date Value  12/16/2018 145  06/10/2013 143   Corrected Ca: 9.3 mg/dL  Assessment: 58 y.o. female admitted on 12/13/2018 with a 14 mm obstructing left UPJ stone with sepsis. PMH significant for CAD s/p coronary artery stent placement, on Brilinta.  She is POD 2 from left ureteral stent placement with Dr. Jeffie Pollock. She is being administered IVMF NS at 125 mL/hr  Goal of Therapy Given Cardiac History: Potassium 4.0 - 5.1 mmol/L Magnesium 2.0 - 2.4 mg/dL Other Electrolytes WNL  Plan:   Continue IVMF w/ 20 KCl at 125 ml/hr  Potassium 40 mEq PO x 1 since potassium decreased on fluids  Mg 1 g IV x 1  Recheck electrolytes in the morning  Tawnya Crook ,PharmD Clinical Pharmacist 12/16/2018 8:04 AM

## 2018-12-16 NOTE — Progress Notes (Signed)
Patient was in room in her bed and asked what was going on outside her window. She said she seen people in [employer] that were walking around and filming outside. I looked out the window and there was no one there. Patient had also asked the tech Katharine Look the same thing. Messaged Griffith, DO about same, no new orders at this time.

## 2018-12-16 NOTE — Progress Notes (Signed)
PHARMACY - PHYSICIAN COMMUNICATION CRITICAL VALUE ALERT - BLOOD CULTURE IDENTIFICATION (BCID)  Brenda Craig is an 58 y.o. female who presented to [facility] on 12/13/2018 with a chief complaint of UTI  Assessment:  Lab reported 1 of 4 bottles + for E Coli, KPC not detected.   Name of physician (or Provider) Contacted: Dr Sidney Ace  Current antibiotics: Vancomycin  Changes to prescribed antibiotics recommended: Resume Meropenem Recommendations accepted by provider, d/c Vancomycin  Results for orders placed or performed during the hospital encounter of 12/13/18  Blood Culture ID Panel (Reflexed) (Collected: 12/13/2018  5:38 PM)  Result Value Ref Range   Enterococcus species NOT DETECTED NOT DETECTED   Listeria monocytogenes NOT DETECTED NOT DETECTED   Staphylococcus species NOT DETECTED NOT DETECTED   Staphylococcus aureus (BCID) NOT DETECTED NOT DETECTED   Streptococcus species NOT DETECTED NOT DETECTED   Streptococcus agalactiae NOT DETECTED NOT DETECTED   Streptococcus pneumoniae NOT DETECTED NOT DETECTED   Streptococcus pyogenes NOT DETECTED NOT DETECTED   Acinetobacter baumannii NOT DETECTED NOT DETECTED   Enterobacteriaceae species DETECTED (A) NOT DETECTED   Enterobacter cloacae complex NOT DETECTED NOT DETECTED   Escherichia coli DETECTED (A) NOT DETECTED   Klebsiella oxytoca NOT DETECTED NOT DETECTED   Klebsiella pneumoniae NOT DETECTED NOT DETECTED   Proteus species NOT DETECTED NOT DETECTED   Serratia marcescens NOT DETECTED NOT DETECTED   Carbapenem resistance NOT DETECTED NOT DETECTED   Haemophilus influenzae NOT DETECTED NOT DETECTED   Neisseria meningitidis NOT DETECTED NOT DETECTED   Pseudomonas aeruginosa NOT DETECTED NOT DETECTED   Candida albicans NOT DETECTED NOT DETECTED   Candida glabrata NOT DETECTED NOT DETECTED   Candida krusei NOT DETECTED NOT DETECTED   Candida parapsilosis NOT DETECTED NOT DETECTED   Candida tropicalis NOT DETECTED NOT DETECTED     Hart Robinsons A 12/16/2018  1:01 AM

## 2018-12-16 NOTE — Consult Note (Signed)
Pharmacy Antibiotic Note  Brenda Craig is a 58 y.o. female admitted on 12/13/2018 with Sepsis secondary to UTI.  Patient is S/P  cystoscopy ureteroscopy and left ureteral stent. Pharmacy has been consulted for meropenem dosing.  Lab reported + BCx, E Coli, KPC not detected.  PCT >150   WBC: 22.2  Tmax: 100  Lactate 3.2   Plan: Restart Meropenem 1g IV every 8 hours.  D/C Vancomycin  D/W Dr Sidney Ace  Height: 5\' 5"  (165.1 cm) Weight: 236 lb 12.4 oz (107.4 kg) IBW/kg (Calculated) : 57  Temp (24hrs), Avg:98 F (36.7 C), Min:97.3 F (36.3 C), Max:98.5 F (36.9 C)  Recent Labs  Lab 12/13/18 1737 12/13/18 1933 12/13/18 2313 12/14/18 0429 12/15/18 0539 12/15/18 0706  WBC 22.2*  --   --  17.9*  --  9.8  CREATININE 1.85*  --   --  1.52* 0.99  --   LATICACIDVEN 2.9* 3.2* 3.4* 1.5 1.0  --     Estimated Creatinine Clearance: 74.6 mL/min (by C-G formula based on SCr of 0.99 mg/dL).    Allergies  Allergen Reactions  . Codeine Nausea And Vomiting and Rash  . Nexium [Esomeprazole Magnesium] Palpitations  . Omeprazole Magnesium Palpitations  . Oxycodone-Acetaminophen Itching, Nausea And Vomiting and Rash  . Percocet [Oxycodone-Acetaminophen] Itching, Nausea And Vomiting and Rash   Antimicrobials this admission: 12/5 Vancomycin x 1 12/5 Cefepime>> x1 12/6-12/7, Resume 12/8  Meropenem >>   Microbiology results: 12/5  BCx: E Coli reported by lab 12/5 UCx: pending  12/5 MRSA PCR: pending 12/5 SARC Coronavirus 2: negative   Thank you for allowing pharmacy to be a part of this patient's care.  Ena Dawley, PharmD Clinical Pharmacist 12/16/2018 12:59 AM

## 2018-12-16 NOTE — TOC Initial Note (Signed)
Transition of Care [facility]) - Initial/Assessment Note    Patient Details  Name: Brenda Craig MRN: [id number] Date of Birth: [birth year]  [facility] CM/SW Contact:    Su Hilt, RN Phone Number: 12/16/2018, 3:44 PM  Clinical Narrative:                 Met with the patient to Discuss DC plan and needs She lives at home with her daughter who is 59 years old, her daughter recently got a Drivers license and provides transportation as needed She is independent at home and states that she does not need DME,  She may possibly need O2 at home, She does not have a chronic respiratory diagnosis and she stated her doctor would diagnose with COPD, I explained it had to be a current not future diagnosis, I explained I would continue to monitor for needs  Expected Discharge Plan: Home/Self Care Barriers to Discharge: Continued Medical Work up   Patient Goals and CMS Choice Patient states their goals for this hospitalization and ongoing recovery are:: get well      Expected Discharge Plan and Services Expected Discharge Plan: Home/Self Care   Discharge Planning Services: CM Consult   Living arrangements for the past 2 months: Single Family Home                 DME Arranged: N/A         HH Arranged: NA          Prior Living Arrangements/Services Living arrangements for the past 2 months: Single Family Home Lives with:: Self, Adult Children(37 year old daughter with ADD) Patient language and need for interpreter reviewed:: No Do you feel safe going back to the place where you live?: Yes      Need for Family Participation in Patient Care: No (Comment) Care giver support system in place?: Yes (comment)   Criminal Activity/Legal Involvement Pertinent to Current Situation/Hospitalization: No - Comment as needed  Activities of Daily Living Home Assistive Devices/Equipment: None ADL Screening (condition at time of admission) Patient's cognitive ability  adequate to safely complete daily activities?: Yes Is the patient deaf or have difficulty hearing?: No Does the patient have difficulty seeing, even when wearing glasses/contacts?: No Does the patient have difficulty concentrating, remembering, or making decisions?: Yes Patient able to express need for assistance with ADLs?: Yes Does the patient have difficulty dressing or bathing?: No Independently performs ADLs?: Yes (appropriate for developmental age) Does the patient have difficulty walking or climbing stairs?: Yes Weakness of Legs: None Weakness of Arms/Hands: None  Permission Sought/Granted                  Emotional Assessment Appearance:: Appears stated age Attitude/Demeanor/Rapport: Engaged Affect (typically observed): Appropriate Orientation: : Oriented to Self, Oriented to Place, Oriented to  Time, Oriented to Situation Alcohol / Substance Use: Not Applicable Psych Involvement: No (comment)  Admission diagnosis:  AKI (acute kidney injury) ([place]) [N17.9] Severe sepsis ([place]) [A41.9, R65.20] Obstruction of left ureter [N13.5] Left lower quadrant abdominal pain [R10.32] Patient Active Problem List   Diagnosis Date Noted  . AKI (acute kidney injury) ([place])   . Obstruction of left ureter   . Severe sepsis ([place]) 12/13/2018  . Septic shock ([place]) 12/13/2018  . OA (osteoarthritis) of knee 05/21/2016  . Atypical chest pain 12/20/2015  . Diarrhea   . Chronic cough 03/28/2015  . Colon polyp 03/28/2015  . DU (duodenal ulcer) 03/28/2015  . Hemorrhoid 03/28/2015  .  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 Calculus of kidney 03/28/2015  . Kidney cysts 03/28/2015  . Displacement of lumbar intervertebral disc without myelopathy 02/14/2015  . Angina pectoris ([place])   . CAD in native artery   . Back pain   . S/P coronary artery stent placement   . Hyperlipidemia   . Encounter for preprocedural cardiovascular examination 05/30/2014  . Excess weight  08/11/2013  . Celiac disease 07/22/2013  . Transient blindness of right eye 05/25/2013  . Acid reflux 01/17/2013  . Airway hyperreactivity 01/17/2013  . Anxiety 01/17/2013  . Arteriosclerosis of coronary artery 01/17/2013  . HLD (hyperlipidemia) 01/17/2013  . BP (high blood pressure) 01/17/2013  . Atherosclerosis of coronary artery 10/31/2012  . Essential hypertension 10/31/2012  . Hypercholesterolemia 10/31/2012  . Presence of stent in anterior descending branch of left coronary artery 10/31/2012  . Presence of stent in coronary artery 10/31/2012   PCP:  [place]ina Billet, MD Pharmacy:   [id number]: [phone]: [phone]    Social Determinants of Health (SDOH) Interventions    Readmission Risk Interventions No flowsheet data found.

## 2018-12-16 NOTE — Plan of Care (Signed)
Pt acknowledges safety precautions and medication education of current medications. Pt eating and drinking well. Emotional at times but able to process feelings and anxiety appropriately. Pdowless,rn

## 2018-12-16 NOTE — Progress Notes (Signed)
Toradol given to pt with complaint of pain being "10". After discussing with pt where pain was and description, Pt stated peri area pain was a "10" each time she had to urinate with ext. Cath.. Encouraged pt to use bedpan instead of external catheter. Pt refused stating "lets see what the pain meds do first". Asked pt if tech or myself could clean peri area again this shift. Pt doesn't want it done at this time. Will encourage to use bedpan as needed. pdowless,rn

## 2018-12-16 NOTE — Progress Notes (Signed)
Urology Inpatient Progress Note  Subjective: Brenda Craig is a 58 y.o. female admitted on 12/13/2018 with a 14 mm obstructing left UVJ stone with sepsis and AKI.  PMH significant for CAD s/p coronary artery stent placement, on Brilinta.  She is POD 3 from left ureteral stent placement with Dr. Jeffie Pollock.  Creatinine downtrending today, 0.82.  Foley discontinued yesterday afternoon.  She reports frequency, urgency, and LLQ pain since discontinuation of her Foley catheter.  She has had difficulty sleeping secondary to the symptoms.  Anti-infectives: Anti-infectives (From admission, onward)   Start     Dose/Rate Route Frequency Ordered Stop   12/16/18 0600  vancomycin (VANCOCIN) 1,250 mg in sodium chloride 0.9 % 250 mL IVPB  Status:  Discontinued     1,250 mg 166.7 mL/hr over 90 Minutes Intravenous Every 24 hours 12/15/18 1149 12/16/18 0058   12/16/18 0200  meropenem (MERREM) 1 g in sodium chloride 0.9 % 100 mL IVPB     1 g 200 mL/hr over 30 Minutes Intravenous Every 8 hours 12/16/18 0058     12/15/18 1800  cefTRIAXone (ROCEPHIN) 2 g in sodium chloride 0.9 % 100 mL IVPB  Status:  Discontinued     2 g 200 mL/hr over 30 Minutes Intravenous Every 24 hours 12/15/18 1123 12/15/18 1141   12/15/18 1400  vancomycin (VANCOCIN) 2,000 mg in sodium chloride 0.9 % 500 mL IVPB     2,000 mg 250 mL/hr over 120 Minutes Intravenous  Once 12/15/18 1149 12/15/18 1602   12/15/18 0900  meropenem (MERREM) 1 g in sodium chloride 0.9 % 100 mL IVPB  Status:  Discontinued     1 g 200 mL/hr over 30 Minutes Intravenous Every 8 hours 12/15/18 0729 12/15/18 1123   12/15/18 0600  cefTRIAXone (ROCEPHIN) 1 g in sodium chloride 0.9 % 100 mL IVPB  Status:  Discontinued     1 g 200 mL/hr over 30 Minutes Intravenous Every 24 hours 12/14/18 0003 12/14/18 0004   12/14/18 0600  ceFEPIme (MAXIPIME) 2 g in sodium chloride 0.9 % 100 mL IVPB  Status:  Discontinued     2 g 200 mL/hr over 30 Minutes Intravenous Every 12 hours  12/13/18 2340 12/13/18 2355   12/14/18 0600  cefTRIAXone (ROCEPHIN) 1 g in sodium chloride 0.9 % 100 mL IVPB  Status:  Discontinued     1 g 200 mL/hr over 30 Minutes Intravenous Every 24 hours 12/14/18 0004 12/14/18 0152   12/14/18 0215  meropenem (MERREM) 1 g in sodium chloride 0.9 % 100 mL IVPB  Status:  Discontinued     1 g 200 mL/hr over 30 Minutes Intravenous Every 12 hours 12/14/18 0201 12/15/18 0729   12/13/18 2315  ceFEPIme (MAXIPIME) 2 g in sodium chloride 0.9 % 100 mL IVPB  Status:  Discontinued     2 g 200 mL/hr over 30 Minutes Intravenous  Once 12/13/18 2307 12/13/18 2309   12/13/18 2145  sodium chloride 0.9 % with cefOXitin (MEFOXIN) ADS Med    Note to Pharmacy: Lawerance Sabal   : cabinet override      12/13/18 2145 12/14/18 0959   12/13/18 1800  vancomycin (VANCOCIN) 1,500 mg in sodium chloride 0.9 % 500 mL IVPB     1,500 mg 250 mL/hr over 120 Minutes Intravenous  Once 12/13/18 1748 12/13/18 2159   12/13/18 1745  ceFEPIme (MAXIPIME) 2 g in sodium chloride 0.9 % 100 mL IVPB     2 g 200 mL/hr over 30 Minutes Intravenous  Once 12/13/18  1740 12/13/18 1826   12/13/18 1745  metroNIDAZOLE (FLAGYL) IVPB 500 mg     500 mg 100 mL/hr over 60 Minutes Intravenous  Once 12/13/18 1740 12/13/18 2112   12/13/18 1745  vancomycin (VANCOCIN) IVPB 1000 mg/200 mL premix     1,000 mg 200 mL/hr over 60 Minutes Intravenous  Once 12/13/18 1740 12/13/18 1904      Current Facility-Administered Medications  Medication Dose Route Frequency Provider Last Rate Last Dose  . 0.9 % NaCl with KCl 20 mEq/ L  infusion   Intravenous Continuous Nicole Kindred A, DO 75 mL/hr at 12/16/18 [id number]    . acetaminophen (TYLENOL) tablet 650 mg  650 mg Oral Q6H PRN Mansy, Jan A, MD   650 mg at 12/16/18 0319   Or  . acetaminophen (TYLENOL) suppository 650 mg  650 mg Rectal Q6H PRN Mansy, Jan A, MD      . albuterol (PROVENTIL) (2.5 MG/3ML) 0.083% nebulizer solution 2.5 mg  2.5 mg Nebulization Q6H PRN Tukov-Yual,  Magdalene S, NP   2.5 mg at 12/15/18 2021  . aspirin EC tablet 81 mg  81 mg Oral Daily Tyler Pita, MD   81 mg at 12/16/18 [id number]  . benzonatate (TESSALON) capsule 100 mg  100 mg Oral TID Tukov-Yual, Magdalene S, NP   100 mg at 12/16/18 0828  . budesonide (PULMICORT) nebulizer solution 0.5 mg  0.5 mg Nebulization BID Tukov-Yual, Magdalene S, NP   0.5 mg at 12/16/18 0728  . dextromethorphan (DELSYM) 30 MG/5ML liquid 30 mg  30 mg Oral BID Tukov-Yual, Magdalene S, NP   30 mg at 12/16/18 0828  . enoxaparin (LOVENOX) injection 40 mg  40 mg Subcutaneous Q24H Mansy, Jan A, MD   40 mg at 12/15/18 [id number]  . ketorolac (TORADOL) 15 MG/ML injection 15 mg  15 mg Intravenous Q6H PRN Mansy, Jan A, MD   15 mg at 12/16/18 0450  . LORazepam (ATIVAN) tablet 0.5 mg  0.5 mg Oral TID PRN Tyler Pita, MD   0.5 mg at 12/16/18 0319  . magnesium hydroxide (MILK OF MAGNESIA) suspension 30 mL  30 mL Oral Daily PRN Mansy, Jan A, MD      . meropenem (MERREM) 1 g in sodium chloride 0.9 % 100 mL IVPB  1 g Intravenous Q8H Hall, Scott A, RPH 200 mL/hr at 12/16/18 0325 1 g at 12/16/18 0325  . montelukast (SINGULAIR) tablet 10 mg  10 mg Oral QHS Tukov-Yual, Magdalene S, NP   10 mg at 12/15/18 2213  . ondansetron (ZOFRAN) tablet 4 mg  4 mg Oral Q6H PRN Mansy, Jan A, MD       Or  . ondansetron [facility]) injection 4 mg  4 mg Intravenous Q6H PRN Mansy, Jan A, MD      . potassium chloride SA (KLOR-CON) CR tablet 40 mEq  40 mEq Oral Q4H Nicole Kindred A, DO   40 mEq at 12/16/18 0905  . rosuvastatin (CRESTOR) tablet 10 mg  10 mg Oral QHS Tukov-Yual, Magdalene S, NP   10 mg at 12/15/18 2214  . ticagrelor (BRILINTA) tablet 60 mg  60 mg Oral BID Tyler Pita, MD   60 mg at 12/16/18 0905  . tiotropium (SPIRIVA) inhalation capsule ([facility] use ONLY) 18 mcg  18 mcg Inhalation Daily Tukov-Yual, Magdalene S, NP   18 mcg at 12/15/18 0921  . traZODone (DESYREL) tablet 25 mg  25 mg Oral QHS PRN Mansy, Arvella Merles, MD  Objective: Vital signs in last 24 hours: Temp:  [97.8 F (36.6 C)-98.5 F (36.9 C)] 97.8 F (36.6 C) (12/08 0804) Pulse Rate:  [76-87] 80 (12/08 0804) Resp:  [13-34] 17 (12/08 0804) BP: (90-129)/(55-82) 129/82 (12/08 0804) SpO2:  [91 %-97 %] 93 % (12/08 0804)  Intake/Output from previous day: 12/07 0701 - 12/08 0700 In: 2642 [P.O.:720; I.V.:502.9; IV Piggyback:1419.1] Out: 3125 [Urine:3125] Intake/Output this shift: Total I/O In: -  Out: 650 [Urine:650]  Physical Exam Vitals signs and nursing note reviewed.  Constitutional:      General: She is not in acute distress.    Appearance: She is not ill-appearing, toxic-appearing or diaphoretic.     Comments: Uncomfortable appearing  HENT:     Head: Normocephalic and atraumatic.  Pulmonary:     Effort: Pulmonary effort is normal. No respiratory distress.  Skin:    General: Skin is warm and dry.  Neurological:     Mental Status: She is alert and oriented to person, place, and time.    Lab Results:  Recent Labs    12/14/18 0429 12/15/18 0706  WBC 17.9* 9.8  HGB 11.9* 10.6*  HCT 35.9* 33.3*  PLT 152 94*   BMET Recent Labs    12/15/18 0539 12/16/18 0529  NA 140 145  K 3.3* 3.0*  CL 115* 115*  CO2 19* 19*  GLUCOSE 143* 107*  BUN 25* 18  CREATININE 0.99 0.82  CALCIUM 7.7* 8.1*   PT/INR Recent Labs    12/13/18 1737 12/14/18 0429  LABPROT 15.1 14.4  INR 1.2 1.1   Assessment & Plan: 58 year old female admitted with an obstructing 14 mm left UVJ stone with sepsis and AKI, now s/p left ureteral stent placement.  Recovering well.  Foley catheter discontinued, now with new left-sided stent pain.  Recommend starting her on oxybutynin for management of this.  Following her conversation with Dr. Erlene Quan yesterday, we are tentatively booking her for ureteroscopy with laser lithotripsy and stent exchange with Dr. Bernardo Heater later this month.  We will obtain cardiac clearance from Dr. Manuella Ghazi at [facility] prior to  surgery.  Debroah Loop, PA-C 12/16/2018

## 2018-12-16 NOTE — Evaluation (Signed)
Physical Therapy Evaluation Patient Details Name: Brenda Craig MRN: [id number] DOB: [birth year] Today's Date: 12/16/2018   History of Present Illness  Pt admitted for sepsis secondary to stone with obstruction. Complaints of AMS with L abdominal pain x 3 days. Emergency surgery performed on 12/5 for cystoscopy with L pyelogram with stent placement. Other PMH includes CAD, CHF, asthma, and GERD.   Clinical Impression  Pt is a pleasant 58 year old female who was admitted for sepsis. Pt performs bed mobility/transfers with min assist and ambulation with cga and RW. When O2 removed, pt quickly de-sats to mid 80s%, O2 donned. Pt demonstrates deficits with endurance/mobility/balance. Pt doesn't appear to be at baseline level at this time. Would benefit from skilled PT to address above deficits and promote optimal return to PLOF; recommend transition to STR upon discharge from acute hospitalization. Anticipate if pt makes enough progress during acute stay, may be able to upgrade recommendation based on performance.      Follow Up Recommendations SNF    Equipment Recommendations  Rolling walker with 5" wheels    Recommendations for Other Services       Precautions / Restrictions Precautions Precautions: Fall Restrictions Weight Bearing Restrictions: No      Mobility  Bed Mobility Overal bed mobility: Needs Assistance Bed Mobility: Supine to Sit     Supine to sit: Min assist     General bed mobility comments: needs assist for sliding B hips towards EOB as well as trunk support to come to EOB.   Transfers Overall transfer level: Needs assistance Equipment used: Rolling walker (2 wheeled) Transfers: Sit to/from Stand Sit to Stand: Min assist         General transfer comment: needs assist for ant translation in order for lift off and upright posture. Once standing, safe technique with RW. All mobility performed on 4L of O2  Ambulation/Gait Ambulation/Gait assistance: Min  guard Gait Distance (Feet): 5 Feet Assistive device: Rolling walker (2 wheeled) Gait Pattern/deviations: Step-to pattern     General Gait Details: fatigues with exertion, limiting further distance at this time. Needs cues for energy conservation. Exertion attempted on RA, however pt quickly demonstrates decreased sats to 86%. With O2 applied, educated on pursed lip breathing, O2 sats at 90%. Further ambulation distance deferred  Stairs            Wheelchair Mobility    Modified Rankin (Stroke Patients Only)       Balance Overall balance assessment: Needs assistance;History of Falls Sitting-balance support: Feet supported Sitting balance-Leahy Scale: Good     Standing balance support: Bilateral upper extremity supported Standing balance-Leahy Scale: Good                               Pertinent Vitals/Pain Pain Assessment: Faces Faces Pain Scale: Hurts little more Pain Location: L abdomen Pain Descriptors / Indicators: Operative site guarding Pain Intervention(s): Limited activity within patient's tolerance;Repositioned;RN gave pain meds during session    Home Living Family/patient expects to be discharged to:: Private residence Living Arrangements: Children(daughter) Available Help at Discharge: Family Type of Home: House Home Access: Stairs to enter Entrance Stairs-Rails: Can reach both Entrance Stairs-Number of Steps: 4 Home Layout: One level Home Equipment: None      Prior Function Level of Independence: Independent         Comments: works part time at preschool. Reports she is unable to get on/off floor with the kids, activity has  decreased in the last year. Reports 1 fall in last 6 months.     Hand Dominance        Extremity/Trunk Assessment   Upper Extremity Assessment Upper Extremity Assessment: Generalized weakness(B UE grossly 4/5)    Lower Extremity Assessment Lower Extremity Assessment: Generalized weakness(B LE grossly  3+/5 evident by functional mobility)       Communication   Communication: No difficulties  Cognition Arousal/Alertness: Awake/alert Behavior During Therapy: WFL for tasks assessed/performed Overall Cognitive Status: Within Functional Limits for tasks assessed                                        General Comments      Exercises Other Exercises Other Exercises: Rolling in bed performed with min assist for diaper donning. Pt struggles with task needing multiple cues for sequencing   Assessment/Plan    PT Assessment Patient needs continued PT services  PT Problem List Decreased strength;Decreased activity tolerance;Decreased balance;Decreased mobility;Decreased knowledge of use of DME;Cardiopulmonary status limiting activity;Pain       PT Treatment Interventions Gait training;Therapeutic exercise;Balance training    PT Goals (Current goals can be found in the Care Plan section)  Acute Rehab PT Goals Patient Stated Goal: to improve in strength PT Goal Formulation: With patient Time For Goal Achievement: 12/30/18 Potential to Achieve Goals: Good    Frequency Min 2X/week   Barriers to discharge        Co-evaluation               AM-PAC PT "6 Clicks" Mobility  Outcome Measure Help needed turning from your back to your side while in a flat bed without using bedrails?: A Little Help needed moving from lying on your back to sitting on the side of a flat bed without using bedrails?: A Little Help needed moving to and from a bed to a chair (including a wheelchair)?: A Little Help needed standing up from a chair using your arms (e.g., wheelchair or bedside chair)?: A Little Help needed to walk in hospital room?: A Lot Help needed climbing 3-5 steps with a railing? : A Lot 6 Click Score: 16    End of Session Equipment Utilized During Treatment: Gait belt;Oxygen Activity Tolerance: Patient limited by fatigue Patient left: in chair;with chair alarm  set Nurse Communication: Mobility status PT Visit Diagnosis: Muscle weakness (generalized) (M62.81);History of falling (Z91.81);Difficulty in walking, not elsewhere classified (R26.2)    Time: [id number] PT Time Calculation (min) (ACUTE ONLY): 38 min   Charges:   PT Evaluation $PT Eval Low Complexity: 1 Low PT Treatments $Therapeutic Activity: 23-37 mins        Greggory Stallion, PT, DPT [phone]   Elif Yonts 12/16/2018, 1:18 PM

## 2018-12-16 NOTE — Progress Notes (Signed)
Initial Nutrition Assessment  DOCUMENTATION CODES:   Obesity unspecified  INTERVENTION:   Ensure Enlive po BID, each supplement provides 350 kcal and 20 grams of protein  MVI daily  3 gram sodium diet/ gluten free  NUTRITION DIAGNOSIS:   Inadequate oral intake related to acute illness as evidenced by per patient/family report.  GOAL:   Patient will meet greater than or equal to 90% of their needs  MONITOR:   PO intake, Supplement acceptance, Labs, Weight trends, Skin, I & O's  REASON FOR ASSESSMENT:   Malnutrition Screening Tool    ASSESSMENT:   58 y.o. female admitted on 12/13/2018 with a 14 mm obstructing left UPJ stone with sepsis and AKI. PMH significant for celiac disease, HTN, CHF, CAD s/p coronary artery stent placement, on Brilinta. Pt now s/p left ureteral stent placement 12/5   Pt reports decreased appetite and oral intake since 11/10 but reports that her appetite has been extremely poor for the past week. Pt is eating 50% of meals in hospital currently. Pt unhappy about her meal choices here at the hospital. Pt with h/o celiac disease; pt follows a strict gluten free diet at home. Pt on a gluten free/heart healthy diet in hospital which is really restrictive for her. RD will change pt over to a low sodium diet in order to allow her more food choices off the menu. RD will also add Ensure supplements at these are gluten free and will help pt meet her estimated needs. Pt reports a 10lb weight loss over the past month but per chart, pt appears to be ~18lbs above her UBW of 218lbs last taken in July.   Medications reviewed and include: aspirin, lovenox, KCl, NaCl w/ KCl @75ml /hr, meropenem   Labs reviewed: K 3.0(L), P 2.6 wnl, Mg 1.9 wnl Hgb 10.6(L), Hct 33.3(L)  NUTRITION - FOCUSED PHYSICAL EXAM:    Most Recent Value  Orbital Region  No depletion  Upper Arm Region  No depletion  Thoracic and Lumbar Region  No depletion  Buccal Region  No depletion  Temple Region   No depletion  Clavicle Bone Region  No depletion  Clavicle and Acromion Bone Region  No depletion  Scapular Bone Region  No depletion  Dorsal Hand  No depletion  Patellar Region  No depletion  Anterior Thigh Region  No depletion  Posterior Calf Region  No depletion  Edema (RD Assessment)  None  Hair  Reviewed  Eyes  Reviewed  Mouth  Reviewed  Skin  Reviewed  Nails  Reviewed     Diet Order:   Diet Order            Diet 2 gram sodium Room service appropriate? Yes; Fluid consistency: Thin  Diet effective now             EDUCATION NEEDS:   Education needs have been addressed  Skin:  Skin Assessment: Reviewed RN Assessment(incision perineum)  Last BM:  pta  Height:   Ht Readings from Last 1 Encounters:  12/14/18 5\' 5"  (1.651 m)    Weight:   Wt Readings from Last 1 Encounters:  12/14/18 107.4 kg    Ideal Body Weight:  56.8 kg  BMI:  Body mass index is 39.4 kg/m.  Estimated Nutritional Needs:   Kcal:  1900-2200kcal/day  Protein:  95-110g/day  Fluid:  >1.4L/day  Koleen Distance MS, RD, LDN Pager #- [phone] Office#- [phone] After Hours Pager: [phone]

## 2018-12-17 LAB — BASIC METABOLIC PANEL
Anion gap: 7 (ref 5–15)
BUN: 13 mg/dL (ref 6–20)
CO2: 23 mmol/L (ref 22–32)
Calcium: 8 mg/dL — ABNORMAL LOW (ref 8.9–10.3)
Chloride: 113 mmol/L — ABNORMAL HIGH (ref 98–111)
Creatinine, Ser: 0.69 mg/dL (ref 0.44–1.00)
GFR calc Af Amer: >60 mL/min (ref >60)
GFR calc non Af Amer: >60 mL/min (ref >60)
Glucose, Bld: 102 mg/dL — ABNORMAL HIGH (ref 70–99)
Potassium: 3.6 mmol/L (ref 3.5–5.1)
Sodium: 143 mmol/L (ref 135–145)

## 2018-12-17 LAB — CBC
HCT: 33.9 % — ABNORMAL LOW (ref 36.0–46.0)
Hemoglobin: 11.2 g/dL — ABNORMAL LOW (ref 12.0–15.0)
MCH: 27.7 pg (ref 26.0–34.0)
MCHC: 33 g/dL (ref 30.0–36.0)
MCV: 83.9 fL (ref 80.0–100.0)
Platelets: 164 10*3/uL (ref 150–400)
RBC: 4.04 MIL/uL (ref 3.87–5.11)
RDW: 16 % — ABNORMAL HIGH (ref 11.5–15.5)
WBC: 9.8 10*3/uL (ref 4.0–10.5)
nRBC: 0 % (ref 0.0–0.2)

## 2018-12-17 NOTE — Progress Notes (Signed)
Pt oxygen saturation is 93% on room air with ambulation.

## 2018-12-17 NOTE — Consult Note (Signed)
[facility]   Recent Labs: Potassium (mmol/L)  Date Value  12/17/2018 3.6  06/10/2013 3.7   Magnesium (mg/dL)  Date Value  12/16/2018 1.9  05/25/2011 1.7 (L)   Calcium (mg/dL)  Date Value  12/17/2018 8.0 (L)   Calcium, Total (mg/dL)  Date Value  06/10/2013 8.9   Albumin (g/dL)  Date Value  12/15/2018 2.5 (L)  05/25/2011 3.5   Phosphorus (mg/dL)  Date Value  12/15/2018 2.6   Sodium (mmol/L)  Date Value  12/17/2018 143  06/10/2013 143   Corrected Ca: 9.3 mg/dL  Assessment: 58 y.o. female admitted on 12/13/2018 with a 14 mm obstructing left UPJ stone with sepsis. PMH significant for CAD s/p coronary artery stent placement, on Brilinta.  She is POD 2 from left ureteral stent placement with Dr. Jeffie Pollock. She is being administered IVMF NS w/ 32mEq KCL at 75 mL/hr  Goal of Therapy Given Cardiac History: Potassium 4.0 - 5.1 mmol/L Magnesium 2.0 - 2.4 mg/dL Other Electrolytes WNL  Plan:   Continues on  IVMF w/ 20 KCl at 75 ml/hr  No additional KCl supplementation at this time  Recheck electrolytes in the morning  Paulina Fusi, PharmD, BCPS 12/17/2018 12:01 PM

## 2018-12-17 NOTE — Progress Notes (Signed)
Pt complaining of chest pain or indigestion. Vs wnl. Pt belching frequently. Pt asked for her PRN Ativan and Tylenol. Pt also asked for her "Nitro and four baby aspirin". Pt informed she did not have Nitro ordered and she has baby aspirin she takes Daily in AM. Pt states "what kind of hospital doesn't have a heart pts nitro and aspirin available". Educated pt on medication and how it works. Pt has been very negative throughout admission. Redirected multiple times when pt made negative statements like "I can't get out of bed and pee", I will have to move from bed to bsc with help and I will never make it bc I have to pee", "this is not going to be pretty" etc. Encouraged pt to look at the good things she is accomplishing and how well she is doing. Pt negative outlook. pdowless,rn

## 2018-12-17 NOTE — Progress Notes (Signed)
D: Pt alert and oriented.   A: Pt received discharge and medication education/information. Pt belongings were gathered and taken with pt upon discharge to include incentive spirometer.  R: Pt verbalized understanding of discharge and medication education/information.  Pt escorted to medical mall front lobby via wheelchair where friend picked her up.  Pt refuse lovenox this morning. Pt educated, pt still refused.

## 2018-12-17 NOTE — Progress Notes (Signed)
PROGRESS NOTE    Brenda Craig  [id number] DOB: November 21, 1960 DOA: 12/13/2018 PCP: Albina Billet, MD    Brief Narrative:  59 y.o.Caucasian femalewith a known history of coronary artery disease, CHF, asthma and GERD, who presented to the emergency room with acute onset of altered mental status with left-sided abdominal pain with associated flank pain, mild dysuria without urinary urgency or frequency or hematuria. Associated fever and chills, nausea and vomiting, mild dyspnea. T-max 103. In the ED, HR 128, RR 28, Temp 99.3 and spO2 94% on room air, normotensive. Labs revealed lactic acid of 2.9 INR 3.4 with procalcitonin more than 150, leukocytosis of 22.2 with neutrophilia. Urinalysis was strongly positive for UTI. Abdominal pelvic CT scan revealed left hydronephrosis secondary to 14 X 9 mm left UPJ calculus with additional tiny nonobstructing renal calculi bilaterally and small to moderate sized hiatal hernia, coronary artery calcifications with note of coronary stent and distended gallbladder as well as aortic atherosclerosis and subsegmental atelectasis with clear lungs otherwise.  Treated with Cefepine and Vanco in the ED, fluid resucitated and urology consulted.  Admitted to ICU with septic shock.   Underwent ureteroscopy and R ureteral stent was placed.  Now hemodynamically stable and transferred to med/surg bed.  Is on meropenem pending final urine culture data.  Urology following    Consultants:   Urology  Procedures:  Ureteroscopy with right ureteral stent placement  Antimicrobials:   Meropenem   Subjective: Pt has no new complaints this am. Being changed. Denies abd pain, sob, cp.  Objective: Vitals:   12/16/18 2356 12/17/18 0200 12/17/18 0752 12/17/18 0830  BP: 130/87 128/85  132/79  Pulse: 94 90  84  Resp: 20 18  17   Temp: 97.8 F (36.6 C) 98 F (36.7 C)  97.9 F (36.6 C)  TempSrc:  Oral    SpO2: 96% 96% 92% 91%  Weight:      Height:         Intake/Output Summary (Last 24 hours) at 12/17/2018 1520 Last data filed at 12/17/2018 1013 Gross per 24 hour  Intake 2650.71 ml  Output 1650 ml  Net 1000.71 ml   Filed Weights   12/13/18 1745 12/14/18 0107  Weight: 107 kg 107.4 kg    Examination:  General exam: Appears calm and comfortable , appropriate, nad Respiratory system: Clear to auscultation. Respiratory effort normal. Cardiovascular system: S1 & S2 heard, RRR. No JVD, murmurs, rubs, gallops or clicks.  Gastrointestinal system: Abdomen is nondistended, soft and nontender. Normal bowel sounds heard. Central nervous system: Alert and oriented. No focal neurological deficits. Extremities: no edema Skin: warm, dry Psychiatry: Judgement and insight appear normal. Mood & affect appropriate.     Data Reviewed: I have personally reviewed following labs and imaging studies  CBC: Recent Labs  Lab 12/13/18 1737 12/14/18 0429 12/15/18 0706 12/17/18 0635  WBC 22.2* 17.9* 9.8 9.8  NEUTROABS 18.9*  --   --   --   HGB 14.7 11.9* 10.6* 11.2*  HCT 44.6 35.9* 33.3* 33.9*  MCV 84.0 84.5 86.5 83.9  PLT 158 152 94* [id number]   Basic Metabolic Panel: Recent Labs  Lab 12/13/18 1737 12/14/18 0429 12/15/18 0539 12/16/18 0529 12/17/18 0635  NA 137 141 140 145 143  K 3.3* 3.5 3.3* 3.0* 3.6  CL 102 112* 115* 115* 113*  CO2 19* 20* 19* 19* 23  GLUCOSE 129* 144* 143* 107* 102*  BUN 30* 26* 25* 18 13  CREATININE 1.85* 1.52* 0.99 0.82 0.69  CALCIUM 8.8*  7.4* 7.7* 8.1* 8.0*  MG  --   --  1.9 1.9  --   PHOS  --   --  2.6  --   --    GFR: Estimated Creatinine Clearance: 92.3 mL/min (by C-G formula based on SCr of 0.69 mg/dL). Liver Function Tests: Recent Labs  Lab 12/13/18 1737 12/14/18 0429 12/15/18 0539  AST 53* 43* 41  ALT 34 29 29  ALKPHOS 127* 103 79  BILITOT 2.5* 1.4* 0.8  PROT 7.7 6.2* 5.5*  ALBUMIN 3.6 2.8* 2.5*   Recent Labs  Lab 12/13/18 1737  LIPASE 15   No results for input(s): AMMONIA in the last 168 hours.  Coagulation Profile: Recent Labs  Lab 12/13/18 1737 12/14/18 0429  INR 1.2 1.1   Cardiac Enzymes: No results for input(s): CKTOTAL, CKMB, CKMBINDEX, TROPONINI in the last 168 hours. BNP (last 3 results) No results for input(s): PROBNP in the last 8760 hours. HbA1C: No results for input(s): HGBA1C in the last 72 hours. CBG: Recent Labs  Lab 12/13/18 2336  GLUCAP 117*   Lipid Profile: No results for input(s): CHOL, HDL, LDLCALC, TRIG, CHOLHDL, LDLDIRECT in the last 72 hours. Thyroid Function Tests: No results for input(s): TSH, T4TOTAL, FREET4, T3FREE, THYROIDAB in the last 72 hours. Anemia Panel: No results for input(s): VITAMINB12, FOLATE, FERRITIN, TIBC, IRON, RETICCTPCT in the last 72 hours. Sepsis Labs: Recent Labs  Lab 12/13/18 1737 12/13/18 1933 12/13/18 2313 12/14/18 0429 12/15/18 0539  PROCALCITON >150.00  --   --  125.07 65.99  LATICACIDVEN 2.9* 3.2* 3.4* 1.5 1.0    Recent Results (from the past 240 hour(s))  Blood Culture (routine x 2)     Status: None (Preliminary result)   Collection Time: 12/13/18  5:37 PM   Specimen: BLOOD  Result Value Ref Range Status   Specimen Description BLOOD BLOOD RIGHT HAND  Final   Special Requests   Final    BOTTLES DRAWN AEROBIC AND ANAEROBIC Blood Culture results may not be optimal due to an inadequate volume of blood received in culture bottles   Culture   Final    NO GROWTH 4 DAYS Performed at [facility], [address], [place], [place] [address]    Report Status PENDING  Incomplete  Blood Culture (routine x 2)     Status: Abnormal (Preliminary result)   Collection Time: 12/13/18  5:38 PM   Specimen: BLOOD  Result Value Ref Range Status   Specimen Description   Final    BLOOD BLOOD LEFT FOREARM Performed at [facility], [address], [place], [place] [address]    Special Requests   Final    BOTTLES DRAWN AEROBIC AND ANAEROBIC Blood Culture adequate volume Performed at [facility], [address] [place], [place] [address]    Culture  Setup Time   Final    GRAM NEGATIVE RODS AEROBIC BOTTLE ONLY CRITICAL RESULT CALLED TO, READ BACK BY AND VERIFIED WITH: SCOTT HALL PHARMD 0013 12/16/2018 HNM    Culture (A)  Final    ESCHERICHIA COLI SUSCEPTIBILITIES TO FOLLOW Performed at [facility], [place] [address], [place], [place] [address]    Report Status PENDING  Incomplete  Blood Culture ID Panel (Reflexed)     Status: Abnormal   Collection Time: 12/13/18  5:38 PM  Result Value Ref Range Status   Enterococcus species NOT DETECTED NOT DETECTED Final   Listeria monocytogenes NOT DETECTED NOT DETECTED Final   Staphylococcus species NOT DETECTED NOT  DETECTED Final   Staphylococcus aureus (BCID) NOT DETECTED NOT DETECTED Final   Streptococcus species NOT DETECTED NOT DETECTED Final   Streptococcus agalactiae NOT DETECTED NOT DETECTED Final   Streptococcus pneumoniae NOT DETECTED NOT DETECTED Final   Streptococcus pyogenes NOT DETECTED NOT DETECTED Final   Acinetobacter baumannii NOT DETECTED NOT DETECTED Final   Enterobacteriaceae species DETECTED (A) NOT DETECTED Final    Comment: Enterobacteriaceae represent a large family of gram-negative bacteria, not a single organism. CRITICAL RESULT CALLED TO, READ BACK BY AND VERIFIED WITH: SCOTT HALL PHARMD 0013 12/16/2018 HNM    Enterobacter cloacae complex NOT DETECTED NOT DETECTED Final   Escherichia coli DETECTED (A) NOT DETECTED Final    Comment: CRITICAL RESULT CALLED TO, READ BACK BY AND VERIFIED WITH: SCOTT HALL PHARMD 0013 12/16/2018 HNM    Klebsiella oxytoca NOT DETECTED NOT DETECTED Final   Klebsiella pneumoniae NOT DETECTED NOT DETECTED Final   Proteus species NOT DETECTED NOT DETECTED Final   Serratia marcescens NOT DETECTED NOT DETECTED Final   Carbapenem resistance NOT DETECTED NOT DETECTED Final   Haemophilus influenzae NOT DETECTED NOT DETECTED Final   Neisseria meningitidis NOT DETECTED NOT DETECTED  Final   Pseudomonas aeruginosa NOT DETECTED NOT DETECTED Final   Candida albicans NOT DETECTED NOT DETECTED Final   Candida glabrata NOT DETECTED NOT DETECTED Final   Candida krusei NOT DETECTED NOT DETECTED Final   Candida parapsilosis NOT DETECTED NOT DETECTED Final   Candida tropicalis NOT DETECTED NOT DETECTED Final    Comment: Performed at [facility], [address], [place], [place] [address]  Urine culture     Status: Abnormal   Collection Time: 12/13/18  7:33 PM   Specimen: In/Out Cath Urine  Result Value Ref Range Status   Specimen Description   Final    IN/OUT CATH URINE Performed at [facility], [address] [place], [place] [address]    Special Requests   Final    NONE Performed at [facility], [address] [place], [place] [address]    Culture 20,000 COLONIES/mL ENTEROCOCCUS FAECALIS (A)  Final   Report Status 12/16/2018 FINAL  Final   Organism ID, Bacteria ENTEROCOCCUS FAECALIS (A)  Final      Susceptibility   Enterococcus faecalis - MIC*    AMPICILLIN <=2 SENSITIVE Sensitive     LEVOFLOXACIN 2 SENSITIVE Sensitive     NITROFURANTOIN <=16 SENSITIVE Sensitive     VANCOMYCIN 1 SENSITIVE Sensitive     * 20,000 COLONIES/mL ENTEROCOCCUS FAECALIS  SARS Coronavirus 2 by RT PCR (hospital order, performed in [place] hospital lab) Nasopharyngeal Nasopharyngeal Swab     Status: None   Collection Time: 12/13/18  7:33 PM   Specimen: Nasopharyngeal Swab  Result Value Ref Range Status   SARS Coronavirus 2 NEGATIVE NEGATIVE Final    Comment: (NOTE) SARS-CoV-2 target nucleic acids are NOT DETECTED. The SARS-CoV-2 RNA is generally detectable in upper and lower respiratory specimens during the acute phase of infection. The lowest concentration of SARS-CoV-2 viral copies this assay can detect is 250 copies / mL. A negative result does not preclude SARS-CoV-2 infection and should not be used as the sole basis for treatment or other patient  management decisions.  A negative result may occur with improper specimen collection / handling, submission of specimen other than nasopharyngeal swab, presence of viral mutation(s) within the areas targeted by this assay, and inadequate number of viral copies (<250 copies / mL). A negative result must be combined  with clinical observations, patient history, and epidemiological information. Fact Sheet for Patients:   StrictlyIdeas.no Fact Sheet for Healthcare Providers: BankingDealers.co.za This test is not yet approved or cleared  by the [place] FDA and has been authorized for detection and/or diagnosis of SARS-CoV-2 by FDA under an Emergency Use Authorization (EUA).  This EUA will remain in effect (meaning this test can be used) for the duration of the COVID-19 declaration under Section 564(b)(1) of the Act, 21 U.S.C. section 360bbb-3(b)(1), unless the authorization is terminated or revoked sooner. Performed at [facility], [address] [place], [place] [address]   Urine Culture     Status: None   Collection Time: 12/13/18 10:30 PM   Specimen: PATH Other; Urine  Result Value Ref Range Status   Specimen Description   Final    URINE, CATHETERIZED Performed at [facility], [address] [place], [place] [address]    Special Requests   Final    NONE Performed at [facility], [address], [place], [place] [address]    Culture   Final    NO GROWTH Performed at [facility], [place] [address], [place], [place] [address]    Report Status 12/15/2018 FINAL  Final  MRSA PCR Screening     Status: None   Collection Time: 12/14/18 12:08 AM   Specimen: Nasopharyngeal  Result Value Ref Range Status   MRSA by PCR NEGATIVE NEGATIVE Final    Comment:        The GeneXpert MRSA Assay (FDA approved for NASAL specimens only), is one component of a comprehensive MRSA colonization surveillance  program. It is not intended to diagnose MRSA infection nor to guide or monitor treatment for MRSA infections. Performed at [facility], [address], [place], [place] [address]          Radiology Studies: No results found.      Scheduled Meds: . aspirin EC  81 mg Oral Daily  . benzonatate  100 mg Oral TID  . budesonide  0.5 mg Nebulization BID  . dextromethorphan  30 mg Oral BID  . enoxaparin (LOVENOX) injection  40 mg Subcutaneous Q24H  . feeding supplement (ENSURE ENLIVE)  237 mL Oral BID BM  . montelukast  10 mg Oral QHS  . multivitamin with minerals  1 tablet Oral Daily  . oxybutynin  5 mg Oral TID  . rosuvastatin  10 mg Oral QHS  . ticagrelor  60 mg Oral BID  . tiotropium  18 mcg Inhalation Daily   Continuous Infusions: . meropenem (MERREM) IV 1 g (12/17/18 1007)    Assessment & Plan:   Active Problems:   Severe sepsis (HCC)   Septic shock (HCC)   AKI (acute kidney injury) ([place])   Obstruction of left ureter   Severe sepsis with septic shock Resolved. Did require pressors and ICU care.   hemodynamically stable.   Likely etiology is urinary tract infection - Ucx- Enter. Faecalis.  -Bacteremia-blood culture positive for E. coli and Entero.-sensitivities pending, will f/u --continue meropenem -- PT/OT  Toxic metabolic encephalopathy Resolved.   Suspect AMS was due to infection.  Chronic diastolic heart failure - compensated -- Euvolemic Continue to monitor   Acute renal failure/ATN - resolved -likely due to septic shock with hypotension requiring pressors.  Nephrolithiasis/Hydronephrosis -Status post left ureteral stent placement 12/5.  By Dr. Jeffie Pollock --Urology following-tentatively booking her for ureteroscopy with laser lithotripsy and stent exchange with Dr. Bernardo Heater later this month.  We  will obtain cardiac clearance from Dr. Manuella Ghazi at [facility] prior to surgery. --Avoid nephrotoxins Continue monitoring her renal function   Urinary Tract Infection Urine culture prelim shows Enterococcus  --continue meropenem --follow up culture susceptibilties   DVT prophylaxis: Lovenox Code Status: Full Code  Family Communication: none at bedside Disposition Plan:  Likely in 1 to 2 days awaiting blood culture sensitivity and urology clearance to be DC'd        LOS: 4 days   Time spent: 45 minutes with more than 50% COC    Nolberto Hanlon, MD [facility] Pager [phone]  If 7PM-7AM, please contact night-coverage [email or web address] Password [email or web address] 12/17/2018, 3:20 PM

## 2018-12-17 NOTE — Plan of Care (Signed)
Ambulating well to bathroom.

## 2018-12-17 NOTE — Progress Notes (Signed)
Pt has ambulated well to bsc several times. Pt desires fluids to be "d/c"d and take meds by mouth. Encourage pt to talk to her physician about medications.PRN Ativan given tonight. Pdowless, rn

## 2018-12-18 ENCOUNTER — Telehealth: Payer: Self-pay | Admitting: Physician Assistant

## 2018-12-18 LAB — BASIC METABOLIC PANEL
Anion gap: 10 (ref 5–15)
BUN: 12 mg/dL (ref 6–20)
CO2: 25 mmol/L (ref 22–32)
Calcium: 8.2 mg/dL — ABNORMAL LOW (ref 8.9–10.3)
Chloride: 108 mmol/L (ref 98–111)
Creatinine, Ser: 0.79 mg/dL (ref 0.44–1.00)
GFR calc Af Amer: >60 mL/min (ref >60)
GFR calc non Af Amer: >60 mL/min (ref >60)
Glucose, Bld: 96 mg/dL (ref 70–99)
Potassium: 3.2 mmol/L — ABNORMAL LOW (ref 3.5–5.1)
Sodium: 143 mmol/L (ref 135–145)

## 2018-12-18 LAB — CULTURE, BLOOD (ROUTINE X 2)
Culture: NO GROWTH
Special Requests: ADEQUATE

## 2018-12-18 MED ORDER — LEVOFLOXACIN 500 MG PO TABS
500.0000 mg | ORAL_TABLET | Freq: Every day | ORAL | Status: DC
  Administered 2018-12-18: 500 mg via ORAL
  Filled 2018-12-18: qty 1

## 2018-12-18 MED ORDER — LEVOFLOXACIN 500 MG PO TABS: 500.0000 mg | ORAL_TABLET | Freq: Every day | ORAL | 0 refills | Status: DC

## 2018-12-18 MED ORDER — POTASSIUM CHLORIDE CRYS ER 20 MEQ PO TBCR
40.0000 meq | EXTENDED_RELEASE_TABLET | Freq: Once | ORAL | Status: AC
  Administered 2018-12-18: 40 meq via ORAL
  Filled 2018-12-18: qty 2

## 2018-12-18 MED ORDER — ADULT MULTIVITAMIN W/MINERALS CH: 1.0000 | ORAL_TABLET | Freq: Every day | ORAL | 0 refills | Status: DC

## 2018-12-18 MED ORDER — OXYBUTYNIN CHLORIDE 5 MG PO TABS: 5.0000 mg | ORAL_TABLET | Freq: Three times a day (TID) | ORAL | 0 refills | Status: DC

## 2018-12-18 NOTE — Telephone Encounter (Signed)
Pt is being discharged today from hospital and needs a follow up appt, she already has one on 12/29/2018 with Sam, is this ok to keep? Or does she need another one scheduled. Please advise.

## 2018-12-18 NOTE — TOC Transition Note (Signed)
Transition of Care [facility]) - CM/SW Discharge Note   Patient Details  Name: Brenda Craig MRN: [id number] Date of Birth: February 08, 1960  Transition of Care [facility] CM/SW Contact:  Su Hilt, RN Phone Number: 12/18/2018, 10:41 AM   Clinical Narrative:    Patient discharging with her 58 year old daughter today, she does not qualify for Home O2, she is independent at home and does not need DME, NO HH wanted, no additional needs  Final next level of care: Home/Self Care Barriers to Discharge: Barriers Resolved   Patient Goals and CMS Choice Patient states their goals for this hospitalization and ongoing recovery are:: get well      Discharge Placement                       Discharge Plan and Services   Discharge Planning Services: CM Consult            DME Arranged: N/A         HH Arranged: NA          Social Determinants of Health (SDOH) Interventions     Readmission Risk Interventions No flowsheet data found.

## 2018-12-18 NOTE — Discharge Summary (Addendum)
Brenda Craig [id number] DOB: 11/22/1960 DOA: 12/13/2018  PCP: Albina Billet, MD  Admit date: 12/13/2018 Discharge date: 12/18/2018  Admitted From: Home Disposition:  Home  Recommendations for Outpatient Follow-up:  1. Follow up with PCP in 1 week 2. Please obtain BMP/CBC in one week 3. Please follow up on the following pending results: none  Home Health: no    Discharge Condition:Stable CODE STATUS:full  Diet recommendation: Heart Healthy   Brief/Interim Summary: Brenda Craig  is a 58 y.o. Caucasian  female with a known history of coronary artery disease, CHF, asthma and GERD, who presented to the emergency room with acute onset of altered mental status with left-sided abdominal pain with associated flank pain, mild dysuria without urinary urgency or frequency or hematuria.  She admits to fever and chills as well as nausea and vomiting..  T-max has been up to 103.  She has been having mild dyspnea without cough or wheezing.  She denied any sick exposure to COVID-19.  No chest pain or palpitations.  In the ED she was found to be tachycardic with respiratory rate of 28 and temperature 99.3.  Her lactic acid was elevated and so was procalcitonin with leukocytosis.  Urinalysis revealed UTI. Abdominal pelvic CT scan revealed left hydronephrosis secondary to 14 X 9 mm left UPJ calculus with additional tiny nonobstructing renal calculi bilaterally and small to moderate sized hiatal hernia, coronary artery calcifications with note of coronary stent and distended gallbladder as well as aortic atherosclerosis and subsegmental atelectasis with clear lungs otherwise.  She was started on antibiotics and was taken to the OR by Dr. Jeffie Pollock for cystoureteroscopy.  She apparently became hypotensive better requiring IV pressor therapy and will be admitted to an ICU bed for further management.  She was hemodynamically stable and transferred out of ICU.  Blood cultures was positive for E. coli and UCX +  for Enterococcus Faecalis. She was started on Meropenem with transition to Levaquin p.o. upon discharge to complete her antibiotic course.  Was complaining of abdominal pain where the stent was placed and urology tentatively are booking her for ureteroscopy with laser lithotripsy and stent exchange with Dr. Bernardo Heater later this month. We will obtain cardiac clearance from Dr. Jodean Lima Cardiologyprior to surgery.  Today she has no complaints and she is stable to be discharged home  Discharge Diagnoses:  Active Problems:   Severe sepsis (HCC)   Septic shock (HCC)   AKI (acute kidney injury) ([place])   Obstruction of left ureter    Discharge Instructions  Discharge Instructions    Call MD for:  temperature >100.4   Complete by: As directed    Diet - low sodium heart healthy   Complete by: As directed    Discharge instructions   Complete by: As directed    Follow up with Urology Dr. Bernardo Heater in one week. Follow up with cardiology Dr. Manuella Ghazi in one week   Increase activity slowly   Complete by: As directed      Allergies as of 12/18/2018      Reactions   Codeine Nausea And Vomiting, Rash   Nexium [esomeprazole Magnesium] Palpitations   Omeprazole Magnesium Palpitations   Oxycodone-acetaminophen Itching, Nausea And Vomiting, Rash   Percocet [oxycodone-acetaminophen] Itching, Nausea And Vomiting, Rash      Medication List    STOP taking these medications   isosorbide mononitrate 30 MG 24 hr tablet Commonly known as: IMDUR   propranolol 10 MG tablet Commonly known as: INDERAL  TAKE these medications   amLODipine 5 MG tablet Commonly known as: NORVASC Take 1 tablet (5 mg total) by mouth daily. Notes to patient: Not given during this hospitalization   azelastine 0.1 % nasal spray Commonly known as: ASTELIN Place 2 sprays into both nostrils 2 (two) times daily. Notes to patient: Not given during this hospitalization   Brilinta 60 MG Tabs tablet Generic drug:  ticagrelor Take 60 mg by mouth 2 (two) times daily.   budesonide 0.5 MG/2ML nebulizer solution Commonly known as: PULMICORT Take 2 mLs (0.5 mg total) by nebulization 2 (two) times daily.   cetirizine 10 MG tablet Commonly known as: ZYRTEC TAKE 1 TABLET BY MOUTH EVERY DAY Notes to patient: Not given during this hospitalization   chlorpheniramine-HYDROcodone 10-8 MG/5ML Suer Commonly known as: TUSSIONEX TAKE 5 MILLILITERS BY ORAL ROUTE EVERY 12 HOURS Notes to patient: Not given during this hospitalization   Crestor 10 MG tablet Generic drug: rosuvastatin Take 10 mg by mouth at bedtime.   [employer] Aspirin Adult Low Dose 81 MG chewable tablet Generic drug: aspirin Chew 81 mg by mouth daily.   levofloxacin 500 MG tablet Commonly known as: LEVAQUIN Take 1 tablet (500 mg total) by mouth daily. Notes to patient: Not given during this hospitalization   LORazepam 1 MG tablet Commonly known as: ATIVAN Take 1 mg by mouth 2 (two) times daily.   mometasone 50 MCG/ACT nasal spray Commonly known as: NASONEX PLACE 2 SPRAYS INTO THE NOSE DAILY. Notes to patient: Not given during this hospitalization   montelukast 10 MG tablet Commonly known as: SINGULAIR Take 10 mg by mouth at bedtime.   multivitamin with minerals Tabs tablet Take 1 tablet by mouth daily. Start taking on: December 19, 2018   Nitrostat 0.4 MG SL tablet Generic drug: nitroGLYCERIN Place 0.4 mg under the tongue every 5 (five) minutes as needed for chest pain. Notes to patient: Not given during this hospitalization   oxybutynin 5 MG tablet Commonly known as: DITROPAN Take 1 tablet (5 mg total) by mouth 3 (three) times daily.   pantoprazole 20 MG tablet Commonly known as: PROTONIX Take 20 mg by mouth 2 (two) times daily. Notes to patient: Not given during this hospitalization   ProAir HFA 108 (90 Base) MCG/ACT inhaler Generic drug: albuterol Inhale 2 puffs into the lungs every 6 (six) hours as needed.   albuterol  (2.5 MG/3ML) 0.083% nebulizer solution Commonly known as: PROVENTIL Take 3 mLs (2.5 mg total) by nebulization every 6 (six) hours as needed for wheezing or shortness of breath.   Symbicort 160-4.5 MCG/ACT inhaler Generic drug: budesonide-formoterol TAKE 2 PUFFS BY MOUTH TWICE A DAY Notes to patient: Not given during this hospitalization   Tiotropium Bromide Monohydrate 1.25 MCG/ACT Aers Commonly known as: Spiriva Respimat Inhale 2 puffs into the lungs daily.      Follow-up Information    Leslee Home, MD On 12/24/2018.   Specialty: Cardiology Why: AT [address] Contact information: [id number] [place] [address] [phone]        Abbie Sons, MD. Go on 12/29/2018.   Specialty: Urology Why: at 9:30 am Contact information: [place] [address] [phone]          Allergies  Allergen Reactions  . Codeine Nausea And Vomiting and Rash  . Nexium [Esomeprazole Magnesium] Palpitations  . Omeprazole Magnesium Palpitations  . Oxycodone-Acetaminophen Itching, Nausea And Vomiting and Rash  . Percocet [Oxycodone-Acetaminophen] Itching, Nausea And Vomiting and  Rash    Consultations:  Urology and critical care   Procedures/Studies: CT ABDOMEN PELVIS WO CONTRAST  Result Date: 12/13/2018 CLINICAL DATA:  Altered mental status, LEFT side abdominal pain for 3 days, febrile yesterday to 103 degrees, history asthma, celiac disease, CHF, coronary artery disease post MI and coronary stenting, hypertension EXAM: CT CHEST, ABDOMEN AND PELVIS WITHOUT CONTRAST TECHNIQUE: Multidetector CT imaging of the chest, abdomen and pelvis was performed following the standard protocol without IV contrast. Sagittal and coronal MPR images reconstructed from axial data set. Oral contrast was not administered. COMPARISON:  CT angio chest 05/21/2016, CT abdomen and pelvis 10/03/2011 FINDINGS: CT CHEST FINDINGS Cardiovascular: Aorta normal  caliber without aneurysm or dissection. Atherosclerotic calcifications within coronary arteries with note of coronary stent. Heart normal size. No pericardial effusion. Mediastinum/Nodes: Base of cervical region normal appearance. Esophagus unremarkable. No thoracic adenopathy. Lungs/Pleura: Subsegmental atelectasis at minor fissure. Lungs otherwise clear. No infiltrate, pleural effusion or pneumothorax. Musculoskeletal: Osseous structures unremarkable. CT ABDOMEN PELVIS FINDINGS Hepatobiliary: Distended gallbladder 5 cm transverse. Liver unremarkable. Pancreas: Normal appearance Spleen: Normal appearance. Small splenule anterior to spleen. Adrenals/Urinary Tract: Adrenal glands normal appearance. Large cyst upper to mid RIGHT kidney 5.7 x 5.5 cm image 64. Small parenchymal calcification versus nonobstructing calculus at upper pole RIGHT kidney. LEFT hydronephrosis secondary to a 14 x 9 mm LEFT UPJ calculus. Mild LEFT perinephric edema and LEFT renal enlargement. Additional tiny nonobstructing renal calculi. Ureters decompressed without calcification. Bladder unremarkable. Stomach/Bowel: Normal appendix. Small to moderate-sized hiatal hernia. Remainder of stomach and bowel loops unremarkable. Vascular/Lymphatic: Atherosclerotic calcifications aorta and iliac arteries without aneurysm. No adenopathy. Reproductive: Atrophic uterus and ovaries Other: No free air or free fluid. No hernia or acute inflammatory process otherwise seen. Musculoskeletal: Osseous structures unremarkable. IMPRESSION: LEFT hydronephrosis secondary to a 14 x 9 mm LEFT UPJ calculus. Additional tiny nonobstructing renal calculi bilaterally. Small to moderate-sized hiatal hernia. Coronary artery calcifications with note of coronary stent. Distended gallbladder. Aortic Atherosclerosis (ICD10-I70.0). Electronically Signed   By: Lavonia Dana M.D.   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 Sagittal and coronal MPR images reconstructed from axial data set. Oral contrast was not administered. COMPARISON:  CT angio chest 05/21/2016, CT abdomen and pelvis 10/03/2011 FINDINGS: CT CHEST FINDINGS Cardiovascular: Aorta normal caliber without aneurysm or dissection. Atherosclerotic calcifications within coronary arteries with note of coronary stent. Heart normal size. No pericardial effusion. Mediastinum/Nodes: Base of cervical region normal appearance. Esophagus unremarkable. No thoracic adenopathy. Lungs/Pleura: Subsegmental atelectasis at minor fissure. Lungs otherwise clear. No infiltrate, pleural effusion or pneumothorax. Musculoskeletal: Osseous structures unremarkable. CT ABDOMEN PELVIS FINDINGS Hepatobiliary: Distended gallbladder 5 cm transverse. Liver unremarkable. Pancreas: Normal appearance Spleen: Normal appearance. Small splenule anterior to spleen. Adrenals/Urinary Tract: Adrenal glands normal appearance. Large cyst upper to mid RIGHT kidney 5.7 x 5.5 cm image 64. Small parenchymal calcification versus nonobstructing calculus at upper pole RIGHT kidney. LEFT hydronephrosis secondary to a 14 x 9 mm LEFT UPJ calculus. Mild LEFT perinephric edema and LEFT renal enlargement. Additional tiny nonobstructing renal calculi. Ureters decompressed without calcification. Bladder unremarkable. Stomach/Bowel: Normal appendix. Small to moderate-sized hiatal hernia. Remainder  of stomach and bowel loops unremarkable. Vascular/Lymphatic: Atherosclerotic calcifications aorta and iliac arteries without aneurysm. No adenopathy. Reproductive: Atrophic  uterus and ovaries Other: No free air or free fluid. No hernia or acute inflammatory process otherwise seen. Musculoskeletal: Osseous structures unremarkable. IMPRESSION: LEFT hydronephrosis secondary to a 14 x 9 mm LEFT UPJ calculus. Additional tiny nonobstructing renal calculi bilaterally. Small to moderate-sized hiatal hernia. Coronary artery calcifications with note of coronary stent. Distended gallbladder. Aortic Atherosclerosis (ICD10-I70.0). Electronically Signed   By: Lavonia Dana M.D.   On: 12/13/2018 19:05   DG Chest Port 1 View  Result Date: 12/13/2018 CLINICAL DATA:  Altered mental status. EXAM: PORTABLE CHEST 1 VIEW COMPARISON:  06/06/2018 FINDINGS: Lung volumes are low with linear opacities at the lung bases bilaterally. No signs of dense consolidation or evidence of pleural effusion. No acute bone finding. IMPRESSION: Low lung volumes with basilar atelectasis. No consolidation or pleural effusion. Electronically Signed   By: Zetta Bills M.D.   On: 12/13/2018 18:33   DG OR UROLOGY CYSTO IMAGE ([facility] ONLY)  Result Date: 12/13/2018 There is no interpretation for this exam.  This order is for images obtained during a surgical procedure.  Please See "Surgeries" Tab for more information regarding the procedure.   [place] EKG SITE RITE  Result Date: 12/14/2018 If Site Rite image not attached, placement could not be confirmed due to current cardiac rhythm.  US Abdomen Limited RUQ  Result Date: 12/13/2018 CLINICAL DATA:  Abdominal pain, sepsis, and elevated LFTs. EXAM: ULTRASOUND ABDOMEN LIMITED RIGHT UPPER QUADRANT COMPARISON:  None. FINDINGS: Gallbladder: The gallbladder is somewhat distended measuring up to 5.8 cm. No wall thickening, stones, sludge, or Murphy's sign. No pericholecystic fluid. Common bile duct: Diameter: 5.4 mm Liver: No focal lesion identified. Within normal limits in parenchymal echogenicity. Portal vein is patent on color Doppler imaging with normal direction of blood flow  towards the liver. Other: None. IMPRESSION: 1. The gallbladder is distended to 5.8 cm with no stones, wall thickening, or Murphy's sign identified. If there is concern for acalculous cholecystitis, a HIDA scan could be performed. 2. Probable hepatic steatosis. Electronically Signed   By: Dorise Bullion III M.D   On: 12/13/2018 20:45      Subjective: Patient has no new complaints.  Tolerating feeding.  No nausea or vomiting.  Discharge Exam: Vitals:   12/17/18 2100 12/18/18 1107  BP: 138/84 114/76  Pulse: 90 99  Resp: 17 18  Temp: 98.7 F (37.1 C) 100 F (37.8 C)  SpO2: 93% 93%   Vitals:   12/17/18 1744 12/17/18 1941 12/17/18 2100 12/18/18 1107  BP: (!) 128/93  138/84 114/76  Pulse: 98  90 99  Resp: 17  17 18   Temp: 99.2 F (37.3 C)  98.7 F (37.1 C) 100 F (37.8 C)  TempSrc: Oral  Oral Oral  SpO2: 94% 93% 93% 93%  Weight:      Height:        General: Pt is alert, awake, not in acute distress Cardiovascular: RRR, S1/S2 +, no rubs, no gallops Respiratory: CTA bilaterally, no wheezing, no rhonchi Abdominal: Soft, NT, ND, bowel sounds + Extremities: no edema, no cyanosis    The results of significant diagnostics from this hospitalization (including imaging, microbiology, ancillary and laboratory) are listed below for reference.     Microbiology: Recent Results (from the past 240 hour(s))  Blood Culture (routine x 2)     Status: None   Collection Time: 12/13/18  5:37 PM   Specimen: BLOOD  Result Value Ref Range Status   Specimen Description BLOOD BLOOD RIGHT HAND  Final   Special Requests   Final    BOTTLES DRAWN AEROBIC AND ANAEROBIC Blood Culture results may not be optimal due to an inadequate volume of blood received in culture bottles   Culture   Final    NO GROWTH 5 DAYS Performed at [facility], [address] [place], [place] [address]    Report Status 12/18/2018 FINAL  Final  Blood Culture (routine x 2)     Status: Abnormal   Collection  Time: 12/13/18  5:38 PM   Specimen: BLOOD  Result Value Ref Range Status   Specimen Description   Final    BLOOD BLOOD LEFT FOREARM Performed at [facility], [address] [place], [place] [address]    Special Requests   Final    BOTTLES DRAWN AEROBIC AND ANAEROBIC Blood Culture adequate volume Performed at [facility], [address] [place], [place] [address]    Culture  Setup Time   Final    GRAM NEGATIVE RODS AEROBIC BOTTLE ONLY CRITICAL RESULT CALLED TO, READ BACK BY AND VERIFIED WITH: SCOTT HALL PHARMD 0013 12/16/2018 HNM    Culture ESCHERICHIA COLI (A)  Final   Report Status 12/18/2018 FINAL  Final   Organism ID, Bacteria ESCHERICHIA COLI  Final      Susceptibility   Escherichia coli - MIC*    AMPICILLIN >=32 RESISTANT Resistant     CEFAZOLIN <=4 SENSITIVE Sensitive     CEFEPIME <=1 SENSITIVE Sensitive     CEFTAZIDIME <=1 SENSITIVE Sensitive     CEFTRIAXONE <=1 SENSITIVE Sensitive     CIPROFLOXACIN <=0.25 SENSITIVE Sensitive     GENTAMICIN <=1 SENSITIVE Sensitive     IMIPENEM <=0.25 SENSITIVE Sensitive     TRIMETH/SULFA <=20 SENSITIVE Sensitive     AMPICILLIN/SULBACTAM >=32 RESISTANT Resistant     PIP/TAZO <=4 SENSITIVE Sensitive     * ESCHERICHIA COLI  Blood Culture ID Panel (Reflexed)     Status: Abnormal   Collection Time: 12/13/18  5:38 PM  Result Value Ref Range Status   Enterococcus species NOT DETECTED NOT DETECTED Final   Listeria monocytogenes NOT DETECTED NOT DETECTED Final   Staphylococcus species NOT DETECTED NOT DETECTED Final   Staphylococcus aureus (BCID) NOT DETECTED NOT DETECTED Final   Streptococcus species NOT DETECTED NOT DETECTED Final   Streptococcus agalactiae NOT DETECTED NOT DETECTED Final   Streptococcus pneumoniae NOT DETECTED NOT DETECTED Final   Streptococcus pyogenes NOT DETECTED NOT DETECTED Final   Acinetobacter baumannii NOT DETECTED NOT DETECTED Final   Enterobacteriaceae species DETECTED (A) NOT DETECTED  Final    Comment: Enterobacteriaceae represent a large family of gram-negative bacteria, not a single organism. CRITICAL RESULT CALLED TO, READ BACK BY AND VERIFIED WITH: SCOTT HALL PHARMD 0013 12/16/2018 HNM    Enterobacter cloacae complex NOT DETECTED NOT DETECTED Final   Escherichia coli DETECTED (A) NOT DETECTED Final    Comment: CRITICAL RESULT CALLED TO, READ BACK BY AND VERIFIED WITH: SCOTT HALL PHARMD 0013 12/16/2018 HNM    Klebsiella oxytoca NOT DETECTED NOT DETECTED Final   Klebsiella pneumoniae NOT DETECTED NOT DETECTED Final   Proteus species NOT DETECTED NOT DETECTED Final   Serratia marcescens NOT DETECTED NOT DETECTED Final   Carbapenem resistance NOT DETECTED NOT DETECTED Final   Haemophilus influenzae NOT DETECTED NOT DETECTED Final   Neisseria meningitidis NOT DETECTED NOT DETECTED Final   Pseudomonas aeruginosa NOT DETECTED NOT DETECTED  Final   Candida albicans NOT DETECTED NOT DETECTED Final   Candida glabrata NOT DETECTED NOT DETECTED Final   Candida krusei NOT DETECTED NOT DETECTED Final   Candida parapsilosis NOT DETECTED NOT DETECTED Final   Candida tropicalis NOT DETECTED NOT DETECTED Final    Comment: Performed at [facility], [address], [place], [place] [address]  Urine culture     Status: Abnormal   Collection Time: 12/13/18  7:33 PM   Specimen: In/Out Cath Urine  Result Value Ref Range Status   Specimen Description   Final    IN/OUT CATH URINE Performed at [facility], [address] [place], [place] [address]    Special Requests   Final    NONE Performed at [facility], [address] [place], [place] [address]    Culture 20,000 COLONIES/mL ENTEROCOCCUS FAECALIS (A)  Final   Report Status 12/16/2018 FINAL  Final   Organism ID, Bacteria ENTEROCOCCUS FAECALIS (A)  Final      Susceptibility   Enterococcus faecalis - MIC*    AMPICILLIN <=2 SENSITIVE Sensitive     LEVOFLOXACIN 2 SENSITIVE Sensitive      NITROFURANTOIN <=16 SENSITIVE Sensitive     VANCOMYCIN 1 SENSITIVE Sensitive     * 20,000 COLONIES/mL ENTEROCOCCUS FAECALIS  SARS Coronavirus 2 by RT PCR (hospital order, performed in [facility] lab) Nasopharyngeal Nasopharyngeal Swab     Status: None   Collection Time: 12/13/18  7:33 PM   Specimen: Nasopharyngeal Swab  Result Value Ref Range Status   SARS Coronavirus 2 NEGATIVE NEGATIVE Final    Comment: (NOTE) SARS-CoV-2 target nucleic acids are NOT DETECTED. The SARS-CoV-2 RNA is generally detectable in upper and lower respiratory specimens during the acute phase of infection. The lowest concentration of SARS-CoV-2 viral copies this assay can detect is 250 copies / mL. A negative result does not preclude SARS-CoV-2 infection and should not be used as the sole basis for treatment or other patient management decisions.  A negative result may occur with improper specimen collection / handling, submission of specimen other than nasopharyngeal swab, presence of viral mutation(s) within the areas targeted by this assay, and inadequate number of viral copies (<250 copies / mL). A negative result must be combined with clinical observations, patient history, and epidemiological information. Fact Sheet for Patients:   StrictlyIdeas.no Fact Sheet for Healthcare Providers: BankingDealers.co.za This test is not yet approved or cleared  by the [place] FDA and has been authorized for detection and/or diagnosis of SARS-CoV-2 by FDA under an Emergency Use Authorization (EUA).  This EUA will remain in effect (meaning this test can be used) for the duration of the COVID-19 declaration under Section 564(b)(1) of the Act, 21 U.S.C. section 360bbb-3(b)(1), unless the authorization is terminated or revoked sooner. Performed at [facility], [address] [place], [place] [address]   Urine Culture     Status: None   Collection  Time: 12/13/18 10:30 PM   Specimen: PATH Other; Urine  Result Value Ref Range Status   Specimen Description   Final    URINE, CATHETERIZED Performed at [facility] [place], [address], [place], [place] [address]    Special Requests   Final    NONE Performed at [facility], [address], [place], [place] [address]    Culture   Final    NO GROWTH Performed at [facility], [place] [address], [place], [place] [address]    Report Status  12/15/2018 FINAL  Final  MRSA PCR Screening     Status: None   Collection Time: 12/14/18 12:08 AM   Specimen: Nasopharyngeal  Result Value Ref Range Status   MRSA by PCR NEGATIVE NEGATIVE Final    Comment:        The GeneXpert MRSA Assay (FDA approved for NASAL specimens only), is one component of a comprehensive MRSA colonization surveillance program. It is not intended to diagnose MRSA infection nor to guide or monitor treatment for MRSA infections. Performed at [facility], [address] [place], [place] [address]      Labs: BNP (last 3 results) No results for input(s): BNP in the last 8760 hours. Basic Metabolic Panel: Recent Labs  Lab 12/14/18 0429 12/15/18 0539 12/16/18 0529 12/17/18 0635 12/18/18 0343  NA 141 140 145 143 143  K 3.5 3.3* 3.0* 3.6 3.2*  CL 112* 115* 115* 113* 108  CO2 20* 19* 19* 23 25  GLUCOSE 144* 143* 107* 102* 96  BUN 26* 25* 18 13 12   CREATININE 1.52* 0.99 0.82 0.69 0.79  CALCIUM 7.4* 7.7* 8.1* 8.0* 8.2*  MG  --  1.9 1.9  --   --   PHOS  --  2.6  --   --   --    Liver Function Tests: Recent Labs  Lab 12/13/18 1737 12/14/18 0429 12/15/18 0539  AST 53* 43* 41  ALT 34 29 29  ALKPHOS 127* 103 79  BILITOT 2.5* 1.4* 0.8  PROT 7.7 6.2* 5.5*  ALBUMIN 3.6 2.8* 2.5*   Recent Labs  Lab 12/13/18 1737  LIPASE 15   No results for input(s): AMMONIA in the last 168 hours. CBC: Recent Labs  Lab 12/13/18 1737 12/14/18 0429 12/15/18 0706 12/17/18 0635  WBC 22.2*  17.9* 9.8 9.8  NEUTROABS 18.9*  --   --   --   HGB 14.7 11.9* 10.6* 11.2*  HCT 44.6 35.9* 33.3* 33.9*  MCV 84.0 84.5 86.5 83.9  PLT 158 152 94* 164   Cardiac Enzymes: No results for input(s): CKTOTAL, CKMB, CKMBINDEX, TROPONINI in the last 168 hours. BNP: Invalid input(s): POCBNP CBG: Recent Labs  Lab 12/13/18 2336  GLUCAP 117*   D-Dimer No results for input(s): DDIMER in the last 72 hours. Hgb A1c No results for input(s): HGBA1C in the last 72 hours. Lipid Profile No results for input(s): CHOL, HDL, LDLCALC, TRIG, CHOLHDL, LDLDIRECT in the last 72 hours. Thyroid function studies No results for input(s): TSH, T4TOTAL, T3FREE, THYROIDAB in the last 72 hours.  Invalid input(s): FREET3 Anemia work up No results for input(s): VITAMINB12, FOLATE, FERRITIN, TIBC, IRON, RETICCTPCT in the last 72 hours. Urinalysis    Component Value Date/Time   COLORURINE YELLOW (A) 12/13/2018 1933   APPEARANCEUR CLOUDY (A) 12/13/2018 1933   LABSPEC 1.011 12/13/2018 1933   PHURINE 5.0 12/13/2018 1933   GLUCOSEU NEGATIVE 12/13/2018 1933   HGBUR LARGE (A) 12/13/2018 [place] NEGATIVE 12/13/2018 1933   KETONESUR 5 (A) 12/13/2018 1933   PROTEINUR 100 (A) 12/13/2018 1933   NITRITE NEGATIVE 12/13/2018 1933   LEUKOCYTESUR TRACE (A) 12/13/2018 1933   [facility] Invalid input(s): PROCALCITONIN,  WBC,  LACTICIDVEN Microbiology Recent Results (from the past 240 hour(s))  Blood Culture (routine x 2)     Status: None   Collection Time: 12/13/18  5:37 PM   Specimen: BLOOD  Result Value Ref Range Status   Specimen Description BLOOD BLOOD RIGHT HAND  Final   Special Requests   Final  BOTTLES DRAWN AEROBIC AND ANAEROBIC Blood Culture results may not be optimal due to an inadequate volume of blood received in culture bottles   Culture   Final    NO GROWTH 5 DAYS Performed at [facility], [address] [place], [place] [address]    Report Status 12/18/2018 FINAL  Final   Blood Culture (routine x 2)     Status: Abnormal   Collection Time: 12/13/18  5:38 PM   Specimen: BLOOD  Result Value Ref Range Status   Specimen Description   Final    BLOOD BLOOD LEFT FOREARM Performed at [facility], [address] [place], [place] [address]    Special Requests   Final    BOTTLES DRAWN AEROBIC AND ANAEROBIC Blood Culture adequate volume Performed at [facility], [address] [place], [place] [address]    Culture  Setup Time   Final    GRAM NEGATIVE RODS AEROBIC BOTTLE ONLY CRITICAL RESULT CALLED TO, READ BACK BY AND VERIFIED WITH: SCOTT HALL PHARMD 0013 12/16/2018 [facility]    Culture ESCHERICHIA COLI (A)  Final   Report Status 12/18/2018 FINAL  Final   Organism ID, Bacteria ESCHERICHIA COLI  Final      Susceptibility   Escherichia coli - MIC*    AMPICILLIN >=32 RESISTANT Resistant     CEFAZOLIN <=4 SENSITIVE Sensitive     CEFEPIME <=1 SENSITIVE Sensitive     CEFTAZIDIME <=1 SENSITIVE Sensitive     CEFTRIAXONE <=1 SENSITIVE Sensitive     CIPROFLOXACIN <=0.25 SENSITIVE Sensitive     GENTAMICIN <=1 SENSITIVE Sensitive     IMIPENEM <=0.25 SENSITIVE Sensitive     TRIMETH/SULFA <=20 SENSITIVE Sensitive     AMPICILLIN/SULBACTAM >=32 RESISTANT Resistant     PIP/TAZO <=4 SENSITIVE Sensitive     * ESCHERICHIA COLI  Blood Culture ID Panel (Reflexed)     Status: Abnormal   Collection Time: 12/13/18  5:38 PM  Result Value Ref Range Status   Enterococcus species NOT DETECTED NOT DETECTED Final   Listeria monocytogenes NOT DETECTED NOT DETECTED Final   Staphylococcus species NOT DETECTED NOT DETECTED Final   Staphylococcus aureus (BCID) NOT DETECTED NOT DETECTED Final   Streptococcus species NOT DETECTED NOT DETECTED Final   Streptococcus agalactiae NOT DETECTED NOT DETECTED Final   Streptococcus pneumoniae NOT DETECTED NOT DETECTED Final   Streptococcus pyogenes NOT DETECTED NOT DETECTED Final   Acinetobacter baumannii NOT DETECTED NOT DETECTED  Final   Enterobacteriaceae species DETECTED (A) NOT DETECTED Final    Comment: Enterobacteriaceae represent a large family of gram-negative bacteria, not a single organism. CRITICAL RESULT CALLED TO, READ BACK BY AND VERIFIED WITH: SCOTT HALL PHARMD 0013 12/16/2018 [facility]    Enterobacter cloacae complex NOT DETECTED NOT DETECTED Final   Escherichia coli DETECTED (A) NOT DETECTED Final    Comment: CRITICAL RESULT CALLED TO, READ BACK BY AND VERIFIED WITH: SCOTT HALL PHARMD 0013 12/16/2018 [facility]    Klebsiella oxytoca NOT DETECTED NOT DETECTED Final   Klebsiella pneumoniae NOT DETECTED NOT DETECTED Final   Proteus species NOT DETECTED NOT DETECTED Final   Serratia marcescens NOT DETECTED NOT DETECTED Final   Carbapenem resistance NOT DETECTED NOT DETECTED Final   Haemophilus influenzae NOT DETECTED NOT DETECTED Final   Neisseria meningitidis NOT DETECTED NOT DETECTED Final   Pseudomonas aeruginosa NOT DETECTED NOT DETECTED Final   Candida albicans NOT DETECTED NOT DETECTED Final   Candida glabrata NOT DETECTED NOT DETECTED Final   Candida krusei NOT DETECTED  NOT DETECTED Final   Candida parapsilosis NOT DETECTED NOT DETECTED Final   Candida tropicalis NOT DETECTED NOT DETECTED Final    Comment: Performed at [facility], [address] [place], [place] [address]  Urine culture     Status: Abnormal   Collection Time: 12/13/18  7:33 PM   Specimen: In/Out Cath Urine  Result Value Ref Range Status   Specimen Description   Final    IN/OUT CATH URINE Performed at [facility], [address] [place], [place] [address]    Special Requests   Final    NONE Performed at [facility], [address] [place], [place] [address]    Culture 20,000 COLONIES/mL ENTEROCOCCUS FAECALIS (A)  Final   Report Status 12/16/2018 FINAL  Final   Organism ID, Bacteria ENTEROCOCCUS FAECALIS (A)  Final      Susceptibility   Enterococcus faecalis - MIC*    AMPICILLIN <=2 SENSITIVE  Sensitive     LEVOFLOXACIN 2 SENSITIVE Sensitive     NITROFURANTOIN <=16 SENSITIVE Sensitive     VANCOMYCIN 1 SENSITIVE Sensitive     * 20,000 COLONIES/mL ENTEROCOCCUS FAECALIS  SARS Coronavirus 2 by RT PCR (hospital order, performed in [place] hospital lab) Nasopharyngeal Nasopharyngeal Swab     Status: None   Collection Time: 12/13/18  7:33 PM   Specimen: Nasopharyngeal Swab  Result Value Ref Range Status   SARS Coronavirus 2 NEGATIVE NEGATIVE Final    Comment: (NOTE) SARS-CoV-2 target nucleic acids are NOT DETECTED. The SARS-CoV-2 RNA is generally detectable in upper and lower respiratory specimens during the acute phase of infection. The lowest concentration of SARS-CoV-2 viral copies this assay can detect is 250 copies / mL. A negative result does not preclude SARS-CoV-2 infection and should not be used as the sole basis for treatment or other patient management decisions.  A negative result may occur with improper specimen collection / handling, submission of specimen other than nasopharyngeal swab, presence of viral mutation(s) within the areas targeted by this assay, and inadequate number of viral copies (<250 copies / mL). A negative result must be combined with clinical observations, patient history, and epidemiological information. Fact Sheet for Patients:   StrictlyIdeas.no Fact Sheet for Healthcare Providers: BankingDealers.co.za This test is not yet approved or cleared  by the [place] FDA and has been authorized for detection and/or diagnosis of SARS-CoV-2 by FDA under an Emergency Use Authorization (EUA).  This EUA will remain in effect (meaning this test can be used) for the duration of the COVID-19 declaration under Section 564(b)(1) of the Act, 21 U.S.C. section 360bbb-3(b)(1), unless the authorization is terminated or revoked sooner. Performed at [facility], [address], [place], [place]  [address]   Urine Culture     Status: None   Collection Time: 12/13/18 10:30 PM   Specimen: PATH Other; Urine  Result Value Ref Range Status   Specimen Description   Final    URINE, CATHETERIZED Performed at [facility], [address] [place], [place] [address]    Special Requests   Final    NONE Performed at [facility], [address] [place], [place] [address]    Culture   Final    NO GROWTH Performed at [facility], [place] [address], [place],  [address]    Report Status 12/15/2018 FINAL  Final  MRSA PCR Screening     Status: None   Collection Time: 12/14/18 12:08 AM   Specimen: Nasopharyngeal  Result Value Ref Range Status   MRSA by PCR NEGATIVE NEGATIVE Final    Comment:        The GeneXpert MRSA Assay (FDA approved for NASAL specimens only), is one component of a comprehensive MRSA colonization surveillance program. It is not intended to diagnose MRSA infection nor to guide or monitor treatment for MRSA infections. Performed at [facility], [address] [place], [place] [address]      Time coordinating discharge: Over 30 minutes  SIGNED:   Nolberto Hanlon, MD  [facility] 12/18/2018, 12:52 PM Pager   If 7PM-7AM, please contact night-coverage [email or web address] Password [email or web address]

## 2018-12-19 ENCOUNTER — Other Ambulatory Visit: Payer: Self-pay | Admitting: Physician Assistant

## 2018-12-19 DIAGNOSIS — Z01818 Encounter for other preprocedural examination: Secondary | ICD-10-CM

## 2018-12-19 DIAGNOSIS — N201 Calculus of ureter: Secondary | ICD-10-CM

## 2018-12-22 ENCOUNTER — Other Ambulatory Visit: Payer: Self-pay

## 2018-12-22 ENCOUNTER — Other Ambulatory Visit: Payer: BC Managed Care – PPO

## 2018-12-22 DIAGNOSIS — N201 Calculus of ureter: Secondary | ICD-10-CM

## 2018-12-22 DIAGNOSIS — Z01818 Encounter for other preprocedural examination: Secondary | ICD-10-CM

## 2018-12-23 LAB — MICROSCOPIC EXAMINATION
Bacteria, UA: NONE SEEN
RBC, Urine: >30 /hpf — AB (ref 0–2)

## 2018-12-23 LAB — URINALYSIS, COMPLETE
Bilirubin, UA: NEGATIVE
Glucose, UA: NEGATIVE
Ketones, UA: NEGATIVE
Leukocytes,UA: NEGATIVE
Nitrite, UA: NEGATIVE
Specific Gravity, UA: >1.03 — ABNORMAL HIGH (ref 1.005–1.030)
Urobilinogen, Ur: 0.2 mg/dL (ref 0.2–1.0)
pH, UA: 6 (ref 5.0–7.5)

## 2018-12-24 ENCOUNTER — Encounter
Admission: RE | Admit: 2018-12-24 | Discharge: 2018-12-24 | Disposition: A | Discharge status: Home / Self Care | Payer: BC Managed Care – PPO | Source: Ambulatory Visit | Attending: Urology | Admitting: Urology

## 2018-12-24 HISTORY — DX: Other complications of anesthesia, initial encounter: T88.59XA

## 2018-12-24 HISTORY — DX: Other specified postprocedural states: Z98.890

## 2018-12-24 HISTORY — DX: Nausea with vomiting, unspecified: R11.2

## 2018-12-24 LAB — CULTURE, URINE COMPREHENSIVE

## 2018-12-24 NOTE — Patient Instructions (Addendum)
Your procedure is scheduled on: 12-30-18 TUESDAY Report to Same Day Surgery 2nd floor medical mall [facility] Entrance-take elevator on left to 2nd floor.  Check in with surgery information desk.) To find out your arrival time please call [phone] between 1PM - 3PM on 12-29-18 MONDAY  Remember: Instructions that are not followed completely may result in serious medical risk, up to and including death, or upon the discretion of your surgeon and anesthesiologist your surgery may need to be rescheduled.    _x___ 1. Do not eat food after midnight the night before your procedure. NO GUM OR CANDY AFTER MIDNIGHT. You may drink clear liquids up to 2 hours before you are scheduled to arrive at the hospital for your procedure.  Do not drink clear liquids within 2 hours of your scheduled arrival to the hospital.  Clear liquids include  --Water or Apple juice without pulp  --Gatorade  --Black Coffee or Clear Tea (No milk, no creamers, do not add anything to the coffee or Tea   ____Ensure clear carbohydrate drink on the way to the hospital for bariatric patients  ____Ensure clear carbohydrate drink 3 hours before surgery.     __x__ 2. No Alcohol for 24 hours before or after surgery.   __x__3. No Smoking or e-cigarettes for 24 prior to surgery.  Do not use any chewable tobacco products for at least 6 hour prior to surgery   ____  4. Bring all medications with you on the day of surgery if instructed.    __x__ 5. Notify your doctor if there is any change in your medical condition     (cold, fever, infections).    x___6. On the morning of surgery brush your teeth with toothpaste and water.  You may rinse your mouth with mouth wash if you wish.  Do not swallow any toothpaste or mouthwash.   Do not wear jewelry, make-up, hairpins, clips or nail polish.  Do not wear lotions, powders, or perfumes. You may wear deodorant.  Do not shave 48 hours prior to surgery. Men may shave face and neck.  Do  not bring valuables to the hospital.    [facility] is not responsible for any belongings or valuables.               Contacts, dentures or bridgework may not be worn into surgery.  Leave your suitcase in the car. After surgery it may be brought to your room.  For patients admitted to the hospital, discharge time is determined by your treatment team.  _  Patients discharged the day of surgery will not be allowed to drive home.  You will need someone to drive you home and stay with you the night of your procedure.    Please read over the following fact sheets that you were given:   [facility] Preparing for Surgery   _x___ TAKE THE FOLLOWING MEDICATION THE MORNING OF SURGERY WITH A SMALL SIP OF WATER. These include:  1. NORVASC (AMLODIPINE)  2. ATIVAN (LORAZEPAM)  3. PROTONIX (PANTOPRAZOLE)  4. DITROPAN (OXYBUTYNIN)  5.  6.  ____Fleets enema or Magnesium Citrate as directed.   ____ Use CHG Soap or sage wipes as directed on instruction sheet   _X___ Use inhalers on the day of surgery and bring to hospital day of surgery-USE ALBUTEROL NEBULIZER DAY OF SURGERY ALONG WITH SYMBICORT AND [place]  ____ Stop Metformin and Janumet 2 days prior to surgery.    ____ Take 1/2 of  usual insulin dose the night before surgery and none on the morning surgery.   _x___ Follow recommendations from Cardiologist, Pulmonologist or PCP regarding stopping Aspirin, Coumadin, Plavix ,Eliquis, Effient, or Pradaxa, and Pletal-PATIENTS LAST DOSE OF ASPIRIN AND BRILLINTA WAS 12-23-18  X____Stop Anti-inflammatories such as Advil, Aleve, Ibuprofen, Motrin, Naproxen, Naprosyn, Goodies powders or aspirin products NOW- OK to take Tylenol   ____ Stop supplements until after surgery.    ____ Bring C-Pap to the hospital.

## 2018-12-24 NOTE — Pre-Procedure Instructions (Signed)
Progress Notes - documented in this encounter Leslee Home, MD - 06/26/2018 10:15 AM EDT Formatting of this note might be different from the original.   Subjective:  RSW:NIOEV [name], MD Referring Provider: No referring provider defined for this encounter.  Patient ID: Brenda Craig is a 58 y.o. female.  Chief Complaint: Chief Complaint  Patient presents with  . Coronary artery disease due to calcified coronary lesion  follow up  . Bleeding/Bruising  bruising from blood thinners   History of Present Illness:  Brenda Craig is a 58 y.o. female presents with follow-up for coronary artery disease, HLD, OSA, and essential hypertension.   Dr. Kennon Rounds conducted this encounter via secure, live, face-to-face video conference with the patient. Brenda Craig was located at her home in [place]. Prior to the interview, the risks and benefits of telemedicine were discussed with the patient and verbal consent was obtained.  06/24/2018: Patient has been doing well since last visit. She states she has occasional symptoms of chest pain however has not taken any nitroglycerin over the past year. She has no recent lab work. Recommended for patient to have regular blood work done with her PCP. We will obtain copies of results.  Her blood pressure and heart rate were reported to be stable. Her main complaint is bruising from Brilinta and aspirin.  She continues to have loss of vision in her eye around 1-2 times a month with associated migraine. Carotid Doppler studies were unremarkable in the past.   12/24/2017 Her angina have been stable recently with no use of nitroglycerin in the past 6 months. When she uses nitroglycerin spray usually pain is improved. Most of the angina has occurred with emotional stress. Anxiety is improved since last visit.  No recent episodes of shortness of breath.  Carvedilol was stopped by neurologist and propranolol  was started 10 mg BID.  History of circumflex coronary artery stent in 2013 and LAD stenting 2014. Has 50% in-stent restenosis of mid LAD with negative FFR on last cath.  06/17/2017 Since last visit she states she has not had nitroglycerin in 11 months. She does have nuisance bleeding on Brlinta. Usually skin bruising is noted. She noticed that when she walks long distances that she bruises easily. She does continue to have migraines, and occasionally has vision loss with her migraines. Ms. Demarco confirms she does not have a headache with visual loss. She has episodes of vision loss about 1 time per month. She had an episode this morning that lasted 6 minutes. Her longest episodes last 15 minutes. She states that her neurologist is reluctant to prescribe other medications for her migraines because she remains on blood thinners. She is now diagnosed having obstructive sleep apnea and has been prescribed CPAP which she uses regularly since January 2019. She sleeps better.   Last visit 10/15/2016 Patient had increased chest pains. She had a recent left scapular back pain persistent after 3 sublingual nitroglycerin and aspirin. Blood pressure was elevated. She went to [facility] 10 days ago. EMS came to sublingual nitroglycerin and aspirin and the pain improved. Initial blood pressure was 180/120 with subsequent blood pressure 166/99. Chest x-ray was negative. MI was ruled out. Today blood pressure is normal. Patient also had transient vision loss lasting for 8 minutes on the right eye with elevated blood pressure. Similar episode lasting 3 minutes of cardiac about 3 days ago.  Patient has persistent chronic cough. Has been seen by pulmonary specialist.  She is on Protonix for reflux. She has been treated with Dexilant in the past.  Patient continues to have chest pains. Nitroglycerin usually relieves the pain. EKG done today shows low QRS voltage. Leftward axis. Inferolateral Q waves are  resolved since last EKG. Patient could not tolerate Ranexa because of increased sickness and elevated blood pressure.  Patient with history of two-vessel coronary artery stents. Cardiac catheterization on 10/18/2014 showed patent circumflex and LAD stents and 50% LAD stenosis with negative FFR.  She has occasional dizziness. Carotid Doppler was negative. Despite increased chest pain use of sublingual nitroglycerin is decreased since last visit. She is on imdur 30 mg half a tablet daily. Patient had a history of ventricular fibrillation and ST elevation myocardial infarction requiring circumflex coronary artery stent in 2013 and unstable angina requiring LAD stent in 2014. Patient is on aspirin and Brilinta since 2013. She is on aspirin 81 mg daily and Brilinta 60 mg twice a day. Blood pressure is well controlled with current medication.  Cholesterol well controlled with statin therapy.   Coronary artery disease risk factors include a history of high cholesterol, hypertension, overweight, family history of coronary artery disease. There is no history of diabetes or gout. Patient never smoked. Multiple family members had premature coronary artery disease and stent placements.  Cardiology History:  05/27/2011-ST elevation myocardial infarction complicated by ventricular fibrillation requiring circumflex coronary artery stent on 05/27/2011.  03/28/2012-Unstable angina with increase of LAD stenosis from 70-95% requiring a single LAD stent on March 28, 2012.  Results/Studies:  ECG: 05/30/2016: Normal sinus rhythm. Low QRS voltage. Leftward axis. Borderline ECG. Compared to 2017 ECG criteria for inferolateral wall infarction resolved.  ECG: 08/22/2015: Normal sinus rhythm. Left anterior fascicular block. Inferolateral wall infarction age undetermined. No acute change compared to previous EKG.  Carotid Doppler 01/31/2016: Summary:  There are small plaque in the common carotid arteries bilaterally. There  was significant tortuosity noted in the right carotid system. There was significant tortuosity noted in the left carotid system. There is no evidence of hemodynamically significant stenosis in either carotid system. Antegrade flow is present in both vertebral arteries.   Lexiscan stress Cardiolite 12/19/2015: Conclusion: Normal poststress ejection fraction of 75 %. Normal rest/stress perfusion scan with no evidence of ischemia or infarction.  2-D echo 08/23/2015 at [facility]: Ejection fraction 60-65%. Normal systolic and diastolic function. No pulmonary hypertension.  Chest x-ray 8/14/2017s: Scattered bibasilar atelectatic changes noted. No active cardiopulmonary disease.  PSEUDOANEURYSM RULE OUT, RIGHT 10/19/14: Summary 1. There is no evidence of a pseudoaneurysm or A-V fistula noted in the right arm. 2. Right arm arterial flow appears grossly normal in radial, ulnar and brachial arteries.   Cardiac Catheterization 10/18/14: Final results : 1. Clinical : Recurrent chest pains etiology undetermined. 2. Anatomic : Normal LAD and marginal circumflex stents. Mild progression of atherosclerosis about 10% at all focal previous stenosis. Mid LAD has 50% stenosis. Negative FFR for hemodynamic significance. Other stenosis including 30% mid and distal right coronary artery and 30% followed by 50% mid LAD stenosis. Ejection fraction improved 55-60%.  10/10/2014 results from [place]:  Abdominal u ltrasound negative for cholelithiasis. Hepatic steatosis noted. Chest x-ray-mild basal lung scarring otherwise normal.   Nuclear stress test 10/09/2014: no clear evidence of stress-induced ischemia. Ejection fraction 45%. Inferolateral wall hypokinesis.  Bilateral carotid duplex scan April 21, 2013:  Negative. Less than 50% stenosis of right and left internal carotid artery. Antegrade flow in both vertebral arteries.  Lab Review:  [facility]  04/04/2017- cholesterol 136 triglycerides 113 HDL 70  LDL 43  Lab from [place] 12/21/2015-CBC normal, BUN 12, potassium 3.8, alkaline phosphatase slightly increase 155, glucose 104, creatine 0.89, liver function test normal. TSH 2.755.  Lab from [facility] 08/22/2015 -cholesterol 106, triglycerides 107, HDL 42, LDL 43. BUN 22, potassium 3.8, glucose 108.  Lab from PCP 11/05/2014-glucose 110, BUN 16, creatine 0174, potassium 4.2, liver function test normal, alkaline phosphatase slightly increased 169, cholesterol 141, triglycerides 125, HDL 62, LDL 54.  Lab from PCP dated 04/23/2013: cholesterol 139 triglycerides 110 HDL 64 LDL 52 TSH normal 1.410. Hemoglobin 13.1 hematocrit 41.3 BUN 15 creatinine 0.77 potassium 4.1 and liver function test normal.   Lipids from PCP dated 10/24/2012. Cholesterol 133, triglycerides 64, HDL 67, LDL 53 all of them excellent.   Past Medical History:  Diagnosis Date  . Abdominal pain  . Abnormal liver function  . Anemia  . Anxiety  . Arthritis  . Balance problems 2016  . Bronchial asthma  WELL CONTROLLED. HAS INHALER BUT HAS NOT USED IN ABOUT A YEAR PER PATIENT  . Bronchitis  . Bursitis of right hip  . Cardiac arrest (CMS/HCC) 2013  . Celiac disease  . Celiac disease  . Chronic bronchitis (CMS/HCC)  . Chronic bronchitis (CMS/HCC)  . Chronic kidney disease  KIDNEY STONES - PASSED  . Claustrophobia  . Complication of anesthesia  . Coronary artery disease  . DOE (dyspnea on exertion)  . Fracture  RIGHT FOOT--NONSURGICAL.  Marland Kitchen GERD (gastroesophageal reflux disease)  . H/O coronary angioplasty  STENTS  . Headache(784.0)  . Heart imaging 2013  EF 45-50%  . High cholesterol  . History of angina  . History of anxiety  . History of chest pain  . Hx of cardiovascular stress test 2013  . Hypertension  BP MEDS CONTROLS BP  . Immunizations up to date  . Lumbar stenosis  . Macular degeneration  . MVP (mitral valve prolapse)  DOES TAKE ANTIBIOTIC PRIOR TO DENTAL PROCEDURES  . Neuropathy  TOES ON  RIGHT FOOT NUMB AND TINGLING.  . NSAID long-term use  BRILINTA/ASA  . Obesity (BMI 30-39.9) 02/11/2015  BMI 33.7  . Ophthalmic migraine 2012  OPTICAL RETINAL MIGRAINE-LOSES VISION IN RIGHT EYE. PT STATES "NEVER MORE THAN 5 MINUTES".  . Osteopenia  . Peptic ulcer disease  HX: HEALED  . Peripheral edema  LEFT ANKLE  . PONV (postoperative nausea and vomiting)  . Sleep apnea  . ST elevation MI (STEMI) (CMS/HCC)  . Tendonitis of right pectoralis major  . Ventricular fibrillation (CMS/HCC)  . Vision loss, right eye  resolved   Past Surgical History:  Procedure Laterality Date  . 3 back surgery  . BACK SURGERY 06/2014  LAMINECTOMY (2 PREVIOUS)  . BURSA REMOVED Right 2015  HIP, STRETCHED IT BAND  . CARDIAC CATHETERIZATION 05/27/11  . CARDIAC CATHETERIZATION 03/28/12  . CESAREAN SECTION  X2  . COLONOSCOPY  . CORONARIES LHC WITH OR WITHOUT LV Left 10/18/2014  Procedure: Coronaries LHC with or without LV; Surgeon: Leslee Home, MD; Location: [facility] INVASIVE CARDIOLOGY; Service: Cardiovascular; Laterality: Left;  . CORONARY STENT PLACEMENT 05/27/11,03/28/2012.  CIRCUMFLEX ; LAD;  . DILATION AND CURETTAGE OF UTERUS  . ESOPHAGOGASTRODUODENOSCOPY 2010 LAST ONE.  Marland Kitchen HIP SURGERY Right 11/2013  . OOPHORECTOMY Right  . UTERINE ABLATION 2013   Social History   Socioeconomic History  . Marital status: Divorced  Spouse name: None  . Number of children: None  . Years of education: None  . Highest  education level: None  Occupational History  . None  Social Needs  . Financial resource strain: None  . Food insecurity  Worry: None  Inability: None  . Transportation needs  Medical: None  Non-medical: None  Tobacco Use  . Smoking status: Never Smoker  . Smokeless tobacco: Never Used  Substance and Sexual Activity  . Alcohol use: Yes  Comment: WINE ONCE A MONTH OR LESS  . Drug use: No  Frequency: 28.0 times per week  . Sexual activity: None  Lifestyle  . Physical  activity  Days per week: None  Minutes per session: None  . Stress: None  Relationships  . Social Medical illustrator on phone: None  Gets together: None  Attends religious service: None  Active member of club or organization: None  Attends meetings of clubs or organizations: None  Relationship status: None  . Intimate partner violence  Fear of current or ex partner: None  Emotionally abused: None  Physically abused: None  Forced sexual activity: None  Other Topics Concern  . None  Social History Narrative  . None   Family History  Problem Relation Age of Onset  . Emphysema Father  . Angina Father  . Thyroid disease Father  . Transient ischemic attack Father  . Thyroid disease Mother  . Arthritis Mother  . Dementia Maternal Grandmother  . Heart attack Maternal Grandfather  . Heart attack Paternal Grandmother   Current Outpatient Medications  Medication Sig Dispense Refill  . amlodipine (NORVASC) 5 MG tablet TAKE 1 TABLET BY MOUTH EVERY DAY 90 tablet 1  . BRILINTA 60 mg Tab tablet TAKE 1 TABLET (60 MG TOTAL) BY MOUTH 2 (TWO) TIMES A DAY. 180 tablet 2  . budesonide-formoterol (SYMBICORT) 160-4.5 mcg/actuation inhaler Inhale 2 puffs 2 (two) times a day.   . cetirizine (ZYRTEC) 10 MG tablet Take 10 mg by mouth daily.  Marland Kitchen CHILD ASPIRIN 81 mg chewable tablet TAKE 1 TABLET EVERY DAY 90 tablet 1  . guaifenesin/DM/pseudoephedrine (TUSSIN CF ORAL) Take 2 tablets by mouth as needed.   . isosorbide mononitrate ER (IMDUR) 30 MG 24 hr tablet TAKE 1 TABLET BY MOUTH EVERY DAY 90 tablet 3  . lorazepam (ATIVAN) 1 MG tablet Take 1 mg by mouth every 4 (four) hours as needed for anxiety (EVERY 4 TO 6 HOURS AS NEEDED).  Marland Kitchen montelukast (SINGULAIR) 10 mg tablet Take 10 mg by mouth nightly.  . nitroglycerin (NITROSTAT) 0.4 MG SL tablet PLACE 1 TABLET UNDER TONGUE EVERY 5 MINUTES AS NEEDED FOR CHEST PAIN. MAX 3 DOSES IN 15 MINUTES 25 tablet 1  . pantoprazole (PROTONIX) 40 MG tablet Take 20 mg by  mouth 2 (two) times a day.   . prednisone (DELTASONE) 20 MG tablet daily. For 30 days 1  . PROAIR HFA 90 mcg/actuation inhaler as needed. 5  . propranolol (INDERAL) 10 MG tablet 2 (two) times a day. 3  . rosuvastatin (CRESTOR) 10 MG tablet TAKE 1 TABLET (10 MG TOTAL) BY MOUTH NIGHTLY. 90 tablet 2  . tiotropium bromide (SPIRIVA WITH HANDIHALER INHL) Inhale 2 puffs daily.  . melatonin (MELATIN) 3 mg Tab tablet Take 3 mg by mouth nightly.   No current facility-administered medications for this visit.   Current Outpatient Medications on File Prior to Visit  Medication Sig Dispense Refill  . amlodipine (NORVASC) 5 MG tablet TAKE 1 TABLET BY MOUTH EVERY DAY 90 tablet 1  . BRILINTA 60 mg Tab tablet TAKE 1 TABLET (60 MG TOTAL) BY MOUTH 2 (TWO)  TIMES A DAY. 180 tablet 2  . budesonide-formoterol (SYMBICORT) 160-4.5 mcg/actuation inhaler Inhale 2 puffs 2 (two) times a day.   . cetirizine (ZYRTEC) 10 MG tablet Take 10 mg by mouth daily.  Marland Kitchen CHILD ASPIRIN 81 mg chewable tablet TAKE 1 TABLET EVERY DAY 90 tablet 1  . guaifenesin/DM/pseudoephedrine (TUSSIN CF ORAL) Take 2 tablets by mouth as needed.   . isosorbide mononitrate ER (IMDUR) 30 MG 24 hr tablet TAKE 1 TABLET BY MOUTH EVERY DAY 90 tablet 3  . lorazepam (ATIVAN) 1 MG tablet Take 1 mg by mouth every 4 (four) hours as needed for anxiety (EVERY 4 TO 6 HOURS AS NEEDED).  Marland Kitchen montelukast (SINGULAIR) 10 mg tablet Take 10 mg by mouth nightly.  . nitroglycerin (NITROSTAT) 0.4 MG SL tablet PLACE 1 TABLET UNDER TONGUE EVERY 5 MINUTES AS NEEDED FOR CHEST PAIN. MAX 3 DOSES IN 15 MINUTES 25 tablet 1  . pantoprazole (PROTONIX) 40 MG tablet Take 20 mg by mouth 2 (two) times a day.   . prednisone (DELTASONE) 20 MG tablet daily. For 30 days 1  . PROAIR HFA 90 mcg/actuation inhaler as needed. 5  . propranolol (INDERAL) 10 MG tablet 2 (two) times a day. 3  . rosuvastatin (CRESTOR) 10 MG tablet TAKE 1 TABLET (10 MG TOTAL) BY MOUTH NIGHTLY. 90 tablet 2  . tiotropium  bromide (SPIRIVA WITH HANDIHALER INHL) Inhale 2 puffs daily.  . melatonin (MELATIN) 3 mg Tab tablet Take 3 mg by mouth nightly.   No current facility-administered medications on file prior to visit.   Allergies  Allergen Reactions  . Gluten  . Nexium [Esomeprazole Magnesium] Other (See Comments)  IRREGULAR HEARTBEAT  . Prilosec [Omeprazole Magnesium] Other (See Comments)  IRREGULAR HEARTBEAT  . Codeine Nausea And Vomiting and Rash  . Oxycodone-Acetaminophen Itching, Nausea And Vomiting and Rash   Review Of Systems:  Constitutional: Negative for fever.  Positive for chronic cough. Has been seen by pulmonary specialist. Cardiovascular: Positive for chest pain.Negative for palpitations, light-headedness, syncope, claudication and leg swelling. Significant improvement in chest pain since last visit.  Eyes:  Suspected TIA with transient vision loss once a month.  Musculoskeletal:  Positive for continued low back pain postop.   Neurology:  Positive for history of migraine headaches.  Positive for transient right eye vision loss.Marland Kitchen Respiratory: Negative for shortness of breath. Gastrointestinal: Negative for nausea and constipation.  Heme/Lymph: Positive for bruises easily. All other systems negative.  The above following portions of the patient's history were reviewed and updated as appropriate: medications, allergies, past medical history, social history, surgical history, current problems. All of the patient's ROS was normal except as documented in the HPI.    Objective:   Wt Readings from Last 3 Encounters:  06/26/18 92.1 kg (203 lb)  12/24/17 92.1 kg (203 lb)  06/17/17 88.9 kg (195 lb 14.4 oz)   Body mass index is 33.78 kg/m. Temp Readings from Last 3 Encounters:  01/18/16 97.8 F (36.6 C) (Temporal)  08/09/15 98.2 F (36.8 C) (Oral)   BP Readings from Last 3 Encounters:  06/26/18 111/75  12/24/17 112/72  06/17/17 118/76   Pulse Readings from Last 3  Encounters:  06/26/18 73  12/24/17 76  06/17/17 81     Physical Exam: Vitals reported as listed above. Moderately overweight white female.  No xanthelasma.  Earlobe crease absent.  Gen: alert and oriented, NAD, well appearing  Resp: normal RR, no tachypnea, no retractions Skin: no visualized rash on observed skin Psych: normal mood,  A and O x3 Neuro: Moves all extremities. Lower extremity: No swelling  Assessment and plan:  Assessment  Angina pectoris (CMS/HCC) (primary encounter diagnosis) Coronary artery disease due to calcified coronary lesion Hypercholesterolemia Essential hypertension Presence of stent in LAD coronary artery Presence of stent in left circumflex coronary artery Migraine with visual aura Coronary artery disease due to calcified coronary lesion History of ST elevation myocardial infarction complicated by ventricular fibrillation requiring circumflex coronary artery stent May 2013.  She had unstable angina due to progression of coronary artery disease requiring LAD stenting March 2014.  Last Catheterization 10/18/2014 showed patent stents with 50% mid LAD stenosis with negative FFR.  On December 2019 Cardiolite scan negative for ischemia. Ejection fraction 75%.  She has had no recent angina with no use of nitroglycerin in the last year.  Continue aspirin 81 mg daily.  Follow-up office visit in 6 months.  Hypercholesterolemia Last LDL was 43 in March 2019.  Excellent control of lipids with Crestor 10 mg daily.  Recommend to get yearly lipid panels. We will obtain copies of results   Essential hypertension Blood pressure reported to be normal Continue Amlodipine 5 mg daily. Carvedilol was stopped by neurologist and propranolol was started 10 mg BID.  Continue low-salt diet and exercise.   Angina pectoris (CMS/HCC) She has history of angina. No recent chest pains and has not used nitroglycerin in the past year  I spent 25 minutes providing  patient care, education, and counseling. Any and all of the patient's/patient's family's questions on this issue have been answered, and I have made no promises or guarantees to the patient. The patient has also been advised to contact this office for worsening conditions or problems, and seek emergency medical treatment and/or call 911 if the patient deems either necessary.  Patient instructions: Please bring Copy of your labs and medications for our review and records.  Your LDL cholesterol goal < 70  Please register yourself to [facility] my charts for reviewing your records.    Return in about 6 months (around 12/26/2018).   (MU) General weight loss/lifestyle modification strategies discussed. (MU) Informal exercise measures discussed, e.g. taking stairs instead of elevator.   Portions of this record may be dictated using voice recognition software. Variances in spelling and vocabulary are possible and unintentional. Some errors may not be recognized or corrected at time of dictation. Please read the chart with this in mind.  Electronically signed by: P.M. Manuella Ghazi, MD, [facility] 06/28/2018 3:19 PM  Priyavadan Hedy Camara obtained and performed the history, physical exam and medical decision making elements that were entered into the chart. Documentation assistance was provided by a scribe. Signed by Clydell Hakim, Scribe.   Electronically signed by Leslee Home, MD at 06/28/2018 3:19 PM EDT   Miscellaneous Notes - documented in this encounter Table of Contents for Miscellaneous Notes  Assessment & Plan Note - Clydell Hakim, [place] - 06/26/2018 11:08 AM EDT  Assessment & Plan Note - Clydell Hakim, MA - 06/26/2018 11:08 AM EDT  Assessment & Plan Note - Clydell Hakim, MA - 06/26/2018 11:08 AM EDT  Assessment & Plan Note - Clydell Hakim, MA - 06/26/2018 11:07 AM EDT    Assessment & Plan Note - Clydell Hakim, MA - 06/26/2018 11:08 AM EDT Associated Problem(s): Angina pectoris  (CMS/HCC)  She has history of angina. No recent chest pains and has not used nitroglycerin in the past year  Electronically signed by Leslee Home, MD at 06/28/2018 3:15 PM EDT  Back to top of Miscellaneous Notes Assessment & Plan Note - Clydell Hakim, MA - 06/26/2018 11:08 AM EDT Associated Problem(s): Essential hypertension  Blood pressure reported to be normal Continue Amlodipine 5 mg daily. Carvedilol was stopped by neurologist and propranolol was started 10 mg BID.  Continue low-salt diet and exercise.   Electronically signed by Leslee Home, MD at 06/28/2018 3:16 PM EDT  Back to top of Miscellaneous Notes Assessment & Plan Note - Clydell Hakim, MA - 06/26/2018 11:08 AM EDT Associated Problem(s): Hypercholesterolemia  Last LDL was 43 in March 2019.  Excellent control of lipids with Crestor 10 mg daily.  Recommend to get yearly lipid panels. We will obtain copies of results   Electronically signed by Leslee Home, MD at 06/28/2018 3:16 PM EDT  Back to top of Miscellaneous Notes Assessment & Plan Note - Clydell Hakim, MA - 06/26/2018 11:07 AM EDT Associated Problem(s): Coronary artery disease due to calcified coronary lesion  History of ST elevation myocardial infarction complicated by ventricular fibrillation requiring circumflex coronary artery stent May 2013.  She had unstable angina due to progression of coronary artery disease requiring LAD stenting March 2014.  Last Catheterization 10/18/2014 showed patent stents with 50% mid LAD stenosis with negative FFR.  On December 2019 Cardiolite scan negative for ischemia. Ejection fraction 75%.  She has had no recent angina with no use of nitroglycerin in the last year.  Continue aspirin 81 mg daily.  Follow-up office visit in 6 months.  Electronically signed by Leslee Home, MD at 06/28/2018 3:15 PM EDT

## 2018-12-25 ENCOUNTER — Telehealth: Payer: Self-pay | Admitting: Urology

## 2018-12-25 MED ORDER — FLUCONAZOLE 100 MG PO TABS: ORAL_TABLET | ORAL | 0 refills | Status: DC

## 2018-12-25 NOTE — Telephone Encounter (Signed)
Called [facility] spoke with Brenda Craig. Requested that test number [phone] be added on. Called pt no answer. Unable to leave message as voicemail is full. My chart sent.

## 2018-12-25 NOTE — Telephone Encounter (Signed)
Preop urine culture growing yeast.  Please contact lab and request further identification.  Will go ahead and start fluconazole.  Rx sent to pharmacy.

## 2018-12-26 ENCOUNTER — Other Ambulatory Visit
Admission: RE | Admit: 2018-12-26 | Discharge: 2018-12-26 | Disposition: A | Discharge status: Home / Self Care | Payer: BC Managed Care – PPO | Source: Ambulatory Visit | Attending: Urology | Admitting: Urology

## 2018-12-26 ENCOUNTER — Encounter: Payer: Self-pay | Admitting: Urology

## 2018-12-26 ENCOUNTER — Other Ambulatory Visit: Payer: Self-pay

## 2018-12-26 DIAGNOSIS — Z20828 Contact with and (suspected) exposure to other viral communicable diseases: Secondary | ICD-10-CM | POA: Insufficient documentation

## 2018-12-26 DIAGNOSIS — U071 COVID-19: Secondary | ICD-10-CM | POA: Insufficient documentation

## 2018-12-26 DIAGNOSIS — Z01812 Encounter for preprocedural laboratory examination: Secondary | ICD-10-CM | POA: Insufficient documentation

## 2018-12-26 LAB — SARS CORONAVIRUS 2 (TAT 6-24 HRS): SARS Coronavirus 2: POSITIVE — AB

## 2018-12-26 LAB — POTASSIUM: Potassium: 3.6 mmol/L (ref 3.5–5.1)

## 2018-12-26 NOTE — Pre-Procedure Instructions (Signed)
Cardiac clearance in care everywhere low-intermediate risk

## 2018-12-26 NOTE — Pre-Procedure Instructions (Signed)
Progress Notes - documented in this encounter Brenda Home, MD - 12/24/2018 2:30 PM EST Formatting of this note might be different from the original.   Subjective:  MVH:QIONG Brenda Musa, MD Referring Provider: No referring provider defined for this encounter.  Patient ID: Brenda Craig is a 58 y.o. female.  Chief Complaint: Chief Complaint  Patient presents with  . Cardiac Clearance  cardiac clearance for kidney stone   History of Present Illness:  Brenda Craig is a 58 y.o. female presents with follow-up for coronary artery disease, HLD, OSA, and essential hypertension.   Dr. Kennon Craig conducted this encounter via secure, live, face-to-face video conference with the patient. Brenda Craig was located at her Craig in [place]. Prior to the interview, the risks and benefits of telemedicine were discussed with the patient and verbal consent was obtained.  Telemedicine visit 12/24/2018: Since last visit patient was hospitalized on 12/13/2018 for severe sepsis with obstruction of left ureter. She underwent emergency cystoscopy with ureteral stent placement. She was discharged on 12/10 in stable conditions. She is scheduled for repeat procedure with 12/30/2018 with Dr. Bernardo Craig at [facility].  During her hospitalization her isosorbide and propanolol were discontinued due to improved blood pressure. She was also instructed to hold her Brilinta for her upcoming procedure.  Blood pressure and heart rate are under good control at Craig. No recurrent episodes of chest pain or shortness of breath. History of circumflex coronary artery stent in 2013 and LAD stenting 2014. Has 50% in-stent restenosis of mid LAD with negative FFR on last cath. She has been on prednisone course on and off throughout this year for her persistent cough with no relief. Will attempt to change her Protonix 20 mg BID to 40 mg   Patient continues to have some cough. Patient's  cough may be related to her reflux. Will start patient on Protonix 40 mg in the morning and famotidine 40 mg in the evening.   Patient had labs done in August 2020 which showed total cholesterol 125, triglycerides 86, HDL 61, LDL 47, glucose 97, BUN 16, creat 0.85, sodium 142, potassium 3.7, normal LFTs.  Discussed with patient COVID-19 precautions such as remaining Craig as much as possible. Instructed patient to practice social distancing and wear a face mask if they need to go outside.   Telemedicine 06/24/2018: Patient has been doing well since last visit. She states she has occasional symptoms of chest pain however has not taken any nitroglycerin over the past year. She has no recent lab work. Recommended for patient to have regular blood work done with her PCP. We will obtain copies of results.  Her blood pressure and heart rate were reported to be stable. Her main complaint is bruising from Brilinta and aspirin.  She continues to have loss of vision in her eye around 1-2 times a month with associated migraine. Carotid Doppler studies were unremarkable in the past.   06/17/2017 She does continue to have migraines, and occasionally has vision loss with her migraines. [name] confirms she does not have a headache with visual loss. She has episodes of vision loss about 1 time per month. She had an episode this morning that lasted 6 minutes. Her longest episodes last 15 minutes. She states that her neurologist is reluctant to prescribe other medications for her migraines because she remains on blood thinners. She is now diagnosed having obstructive sleep apnea and has been prescribed CPAP which she uses regularly since January 2019.  She sleeps better.   10/15/2016 Patient had increased chest pains. She had a recent left scapular back pain persistent after 3 sublingual nitroglycerin and aspirin. Blood pressure was elevated. She went [facility] 10 days ago. EMS came to sublingual  nitroglycerin and aspirin and the pain improved. Initial blood pressure was 180/120 with subsequent blood pressure 166/99. Chest x-ray was negative. MI was ruled out. Patient continues to have chest pains. Nitroglycerin usually relieves the pain. EKG done today shows low QRS voltage. Leftward axis. Inferolateral Q waves are resolved since last EKG. Patient could not tolerate Ranexa because of increased sickness and elevated blood pressure.  Patient with history of two-vessel coronary artery stents. Cardiac catheterization on 10/18/2014 showed patent circumflex and LAD stents and 50% LAD stenosis with negative FFR.   Patient had a history of ventricular fibrillation and ST elevation myocardial infarction requiring circumflex coronary artery stent in 2013 and unstable angina requiring LAD stent in 2014. Patient is on aspirin and Brilinta since 2013. She is on aspirin 81 mg daily and Brilinta 60 mg twice a day.   Coronary artery disease risk factors include a history of high cholesterol, hypertension, overweight, family history of coronary artery disease. There is no history of diabetes or gout. Patient never smoked. Multiple family members had premature coronary artery disease and stent placements.  Cardiology History:  05/27/2011-ST elevation myocardial infarction complicated by ventricular fibrillation requiring circumflex coronary artery stent on 05/27/2011.  03/28/2012-Unstable angina with increase of LAD stenosis from 70-95% requiring a single LAD stent on March 28, 2012.  Results/Studies:  ECG: 05/30/2016: Normal sinus rhythm. Low QRS voltage. Leftward axis. Borderline ECG. Compared to 2017 ECG criteria for inferolateral wall infarction resolved.  ECG: 08/22/2015: Normal sinus rhythm. Left anterior fascicular block. Inferolateral wall infarction age undetermined. No acute change compared to previous EKG.  Carotid Doppler 01/31/2016: Summary:  There are small plaque in the common carotid arteries  bilaterally. There was significant tortuosity noted in the right carotid system. There was significant tortuosity noted in the left carotid system. There is no evidence of hemodynamically significant stenosis in either carotid system. Antegrade flow is present in both vertebral arteries.   Lexiscan stress Cardiolite 12/19/2015: Conclusion: Normal poststress ejection fraction of 75 %. Normal rest/stress perfusion scan with no evidence of ischemia or infarction.  2-D echo 08/23/2015 at [facility]: Ejection fraction 60-65%. Normal systolic and diastolic function. No pulmonary hypertension.  Chest x-ray 8/14/2017s: Scattered bibasilar atelectatic changes noted. No active cardiopulmonary disease.  PSEUDOANEURYSM RULE OUT, RIGHT 10/19/14: Summary 1. There is no evidence of a pseudoaneurysm or A-V fistula noted in the right arm. 2. Right arm arterial flow appears grossly normal in radial, ulnar and brachial arteries.   Cardiac Catheterization 10/18/14: Final results : 1. Clinical : Recurrent chest pains etiology undetermined. 2. Anatomic : Normal LAD and marginal circumflex stents. Mild progression of atherosclerosis about 10% at all focal previous stenosis. Mid LAD has 50% stenosis. Negative FFR for hemodynamic significance. Other stenosis including 30% mid and distal right coronary artery and 30% followed by 50% mid LAD stenosis. Ejection fraction improved 55-60%.  10/10/2014 results from [place]:  Abdominal u ltrasound negative for cholelithiasis. Hepatic steatosis noted. Chest x-ray-mild basal lung scarring otherwise normal.   Nuclear stress test 10/09/2014: no clear evidence of stress-induced ischemia. Ejection fraction 45%. Inferolateral wall hypokinesis.  Bilateral carotid duplex scan April 21, 2013:  Negative. Less than 50% stenosis of right and left internal carotid artery. Antegrade flow in both vertebral  arteries.  Lab Review:  Outside labs 08/29/2018: total cholesterol  125, triglycerides 86, HDL 61, LDL 47, glucose 97, BUN 16, creat 0.85, sodium 142, potassium 3.7, normal LFTs  [facility] lab 04/04/2017- cholesterol 136 triglycerides 113 HDL 70 LDL 43  Past Medical History:  Diagnosis Date  . Abdominal pain  . Abnormal liver function  . Anemia  . Anxiety  . Arthritis  . Balance problems 2016  . Bronchial asthma  WELL CONTROLLED. HAS INHALER BUT HAS NOT USED IN ABOUT A YEAR PER PATIENT  . Bronchitis  . Bursitis of right hip  . Cardiac arrest (CMS/HCC) 2013  . Celiac disease  . Celiac disease  . Chronic bronchitis (CMS/HCC)  . Chronic bronchitis (CMS/HCC)  . Chronic kidney disease  KIDNEY STONES - PASSED  . Claustrophobia  . Complication of anesthesia  . Coronary artery disease  . DOE (dyspnea on exertion)  . Fracture  RIGHT FOOT--NONSURGICAL.  Marland Kitchen GERD (gastroesophageal reflux disease)  . H/O coronary angioplasty  STENTS  . Headache(784.0)  . Heart imaging 2013  EF 45-50%  . High cholesterol  . History of angina  . History of anxiety  . History of chest pain  . Hx of cardiovascular stress test 2013  . Hypertension  BP MEDS CONTROLS BP  . Immunizations up to date  . Lumbar stenosis  . Macular degeneration  . MVP (mitral valve prolapse)  DOES TAKE ANTIBIOTIC PRIOR TO DENTAL PROCEDURES  . Neuropathy  TOES ON RIGHT FOOT NUMB AND TINGLING.  . NSAID long-term use  BRILINTA/ASA  . Obesity (BMI 30-39.9) 02/11/2015  BMI 33.7  . Ophthalmic migraine 2012  OPTICAL RETINAL MIGRAINE-LOSES VISION IN RIGHT EYE. PT STATES "NEVER MORE THAN 5 MINUTES".  . Osteopenia  . Peptic ulcer disease  HX: HEALED  . Peripheral edema  LEFT ANKLE  . PONV (postoperative nausea and vomiting)  . Sleep apnea  . ST elevation MI (STEMI) (CMS/HCC)  . Tendonitis of right pectoralis major  . Ventricular fibrillation (CMS/HCC)  . Vision loss, right eye  resolved   Past Surgical History:  Procedure Laterality Date  . 3 back surgery  . BACK SURGERY 06/2014   LAMINECTOMY (2 PREVIOUS)  . BURSA REMOVED Right 2015  HIP, STRETCHED IT BAND  . CARDIAC CATHETERIZATION 05/27/11  . CARDIAC CATHETERIZATION 03/28/12  . CESAREAN SECTION  X2  . COLONOSCOPY  . CORONARIES LHC WITH OR WITHOUT LV Left 10/18/2014  Procedure: Coronaries LHC with or without LV; Surgeon: Brenda Home, MD; Location: [facility] INVASIVE CARDIOLOGY; Service: Cardiovascular; Laterality: Left;  . CORONARY STENT PLACEMENT 05/27/11,03/28/2012.  CIRCUMFLEX ; LAD;  . DILATION AND CURETTAGE OF UTERUS  . ESOPHAGOGASTRODUODENOSCOPY 2010 LAST ONE.  Marland Kitchen HIP SURGERY Right 11/2013  . OOPHORECTOMY Right  . UTERINE ABLATION 2013   Social History   Socioeconomic History  . Marital status: Divorced  Spouse name: None  . Number of children: None  . Years of education: None  . Highest education level: None  Occupational History  . None  Social Needs  . Financial resource strain: None  . Food insecurity  Worry: None  Inability: None  . Transportation needs  Medical: None  Non-medical: None  Tobacco Use  . Smoking status: Never Smoker  . Smokeless tobacco: Never Used  Substance and Sexual Activity  . Alcohol use: Yes  Comment: WINE ONCE A MONTH OR LESS  . Drug use: No  Frequency: 28.0 times per week  . Sexual activity: None  Lifestyle  . Physical activity  Days  per week: None  Minutes per session: None  . Stress: None  Relationships  . Social Medical illustrator on phone: None  Gets together: None  Attends religious service: None  Active member of club or organization: None  Attends meetings of clubs or organizations: None  Relationship status: None  . Intimate partner violence  Fear of current or ex partner: None  Emotionally abused: None  Physically abused: None  Forced sexual activity: None  Other Topics Concern  . None  Social History Narrative  . None   Family History  Problem Relation Age of Onset  . Emphysema Father  . Angina Father  . Thyroid  disease Father  . Transient ischemic attack Father  . Thyroid disease Mother  . Arthritis Mother  . Dementia Maternal Grandmother  . Heart attack Maternal Grandfather  . Heart attack Paternal Grandmother   Current Outpatient Medications  Medication Sig Dispense Refill  . acetaminophen (TYLENOL) 500 MG tablet Take 1,000 mg by mouth 3 (three) times a day as needed for pain or moderate pain.  Marland Kitchen amlodipine (NORVASC) 5 MG tablet TAKE 1 TABLET BY MOUTH EVERY DAY 90 tablet 1  . budesonide-formoterol (SYMBICORT) 160-4.5 mcg/actuation inhaler Inhale 2 puffs 2 (two) times a day.   . cetirizine (ZYRTEC) 10 MG tablet Take 10 mg by mouth daily.  Marland Kitchen guaifenesin/DM/pseudoephedrine (TUSSIN CF ORAL) Take 2 tablets by mouth as needed.   . lorazepam (ATIVAN) 1 MG tablet Take 1 mg by mouth every 4 (four) hours as needed for anxiety (EVERY 4 TO 6 HOURS AS NEEDED).  Marland Kitchen montelukast (SINGULAIR) 10 mg tablet Take 10 mg by mouth nightly.  . nitroglycerin (NITROSTAT) 0.4 MG SL tablet PLACE 1 TABLET UNDER TONGUE EVERY 5 MINUTES AS NEEDED FOR CHEST PAIN. MAX 3 DOSES IN 15 MINUTES 25 tablet 1  . oxybutynin (DITROPAN) 5 MG tablet TAKE 1 TABLET BY MOUTH THREE TIMES A DAY  . pantoprazole (PROTONIX) 40 MG tablet Take 1 tablet (40 mg total) by mouth every morning. 90 tablet 3  . PROAIR HFA 90 mcg/actuation inhaler as needed. 5  . rosuvastatin (CRESTOR) 10 MG tablet Take 1 tablet (10 mg total) by mouth nightly. 90 tablet 3  . tiotropium bromide (SPIRIVA WITH HANDIHALER INHL) Inhale 2 puffs daily.  Marland Kitchen aspirin 81 MG chewable tablet TAKE 1 TABLET BY MOUTH EVERY DAY 90 tablet 2  . BRILINTA 60 mg Tab tablet TAKE 1 TABLET (60 MG TOTAL) BY MOUTH 2 (TWO) TIMES A DAY. 180 tablet 0  . famotidine (PEPCID) 40 MG tablet Take 1 tablet (40 mg total) by mouth nightly. 90 tablet 3   No current facility-administered medications for this visit.   Current Outpatient Medications on File Prior to Visit  Medication Sig Dispense Refill  .  acetaminophen (TYLENOL) 500 MG tablet Take 1,000 mg by mouth 3 (three) times a day as needed for pain or moderate pain.  Marland Kitchen amlodipine (NORVASC) 5 MG tablet TAKE 1 TABLET BY MOUTH EVERY DAY 90 tablet 1  . budesonide-formoterol (SYMBICORT) 160-4.5 mcg/actuation inhaler Inhale 2 puffs 2 (two) times a day.   . cetirizine (ZYRTEC) 10 MG tablet Take 10 mg by mouth daily.  Marland Kitchen guaifenesin/DM/pseudoephedrine (TUSSIN CF ORAL) Take 2 tablets by mouth as needed.   . lorazepam (ATIVAN) 1 MG tablet Take 1 mg by mouth every 4 (four) hours as needed for anxiety (EVERY 4 TO 6 HOURS AS NEEDED).  Marland Kitchen montelukast (SINGULAIR) 10 mg tablet Take 10 mg by mouth nightly.  . nitroglycerin (  NITROSTAT) 0.4 MG SL tablet PLACE 1 TABLET UNDER TONGUE EVERY 5 MINUTES AS NEEDED FOR CHEST PAIN. MAX 3 DOSES IN 15 MINUTES 25 tablet 1  . oxybutynin (DITROPAN) 5 MG tablet TAKE 1 TABLET BY MOUTH THREE TIMES A DAY  . PROAIR HFA 90 mcg/actuation inhaler as needed. 5  . rosuvastatin (CRESTOR) 10 MG tablet Take 1 tablet (10 mg total) by mouth nightly. 90 tablet 3  . tiotropium bromide (SPIRIVA WITH HANDIHALER INHL) Inhale 2 puffs daily.  . [DISCONTINUED] pantoprazole (PROTONIX) 40 MG tablet Take 20 mg by mouth 2 (two) times a day.   Marland Kitchen aspirin 81 MG chewable tablet TAKE 1 TABLET BY MOUTH EVERY DAY 90 tablet 2  . BRILINTA 60 mg Tab tablet TAKE 1 TABLET (60 MG TOTAL) BY MOUTH 2 (TWO) TIMES A DAY. 180 tablet 0  . [DISCONTINUED] isosorbide mononitrate ER (IMDUR) 30 MG 24 hr tablet TAKE 1 TABLET BY MOUTH EVERY DAY 90 tablet 0  . [DISCONTINUED] melatonin (MELATIN) 3 mg Tab tablet Take 3 mg by mouth nightly.  . [DISCONTINUED] prednisone (DELTASONE) 20 MG tablet daily. For 30 days 1  . [DISCONTINUED] propranolol (INDERAL) 10 MG tablet 2 (two) times a day. 3   No current facility-administered medications on file prior to visit.   Allergies  Allergen Reactions  . Gluten  . Nexium [Esomeprazole Magnesium] Other (See Comments)  IRREGULAR  HEARTBEAT  . Prilosec [Omeprazole Magnesium] Other (See Comments)  IRREGULAR HEARTBEAT  . Codeine Nausea And Vomiting and Rash  . Oxycodone-Acetaminophen Itching, Nausea And Vomiting and Rash   Review Of Systems:  Constitutional: Negative for fever.  Positive for chronic cough. Has been seen by pulmonary specialist. Cardiovascular: Positive for chest pain.Negative for palpitations, light-headedness, syncope, claudication and leg swelling. Significant improvement in chest pain since last visit.  Eyes:  Suspected TIA with transient vision loss once a month.  Musculoskeletal:  Positive for continued low back pain postop.   Neurology:  Positive for history of migraine headaches.  Positive for transient right eye vision loss.Marland Kitchen Respiratory: Negative for shortness of breath. Gastrointestinal: Negative for nausea and constipation.  Heme/Lymph: Positive for bruises easily. All other systems negative.  The above following portions of the patient's history were reviewed and updated as appropriate: medications, allergies, past medical history, social history, surgical history, current problems. All of the patient's ROS was normal except as documented in the HPI.    Objective:   Wt Readings from Last 3 Encounters:  12/24/18 96.2 kg (212 lb)  06/26/18 92.1 kg (203 lb)  12/24/17 92.1 kg (203 lb)   Body mass index is 35.28 kg/m. Temp Readings from Last 3 Encounters:  01/18/16 97.8 F (36.6 C) (Temporal)  08/09/15 98.2 F (36.8 C) (Oral)   BP Readings from Last 3 Encounters:  12/24/18 122/77  06/26/18 111/75  12/24/17 112/72   Pulse Readings from Last 3 Encounters:  12/24/18 105  06/26/18 73  12/24/17 76     Physical Exam   Physical Exam: Patient reported vitals as listed above. Moderately overweight white female.  No xanthelasma.  Earlobe crease absent.  Gen: alert and oriented, NAD, well appearing  Resp: normal RR, no tachypnea, no retractions Skin: no visualized  rash on observed skin Psych: normal mood, A and O x3 Neuro: Moves all extremities. Lower extremity: No swelling  Assessment and plan:  Assessment  Pre-operative cardiovascular examination (primary encounter diagnosis) Coronary artery disease due to calcified coronary lesion Presence of stent in LAD coronary artery Presence of stent in  left circumflex coronary artery Angina pectoris (CMS/HCC) Hypercholesterolemia Bilateral carotid artery stenosis Essential hypertension Obstructive sleep apnea on CPAP Overweight Mild intermittent asthma without complication Kidney stone on left side Pre-operative cardiovascular examination Left kidney stone Urosepsis  -Patient was hospitalized on 12/13/2018 for severe sepsis with obstruction of left ureter. She underwent emergency cystoscopy with ureteral stent placement. -She is scheduled for ureteroscopy with later lithtripsy and stent exchange with 12/30/2018 with Dr. Bernardo Craig at [facility]. -Patient has multiple cardiovascular risk factors -Patient will be at low to intermediate risk of cardiac complication during surgical procedure. Completed clearance paperwork. -Patient has held [place] starting today.   Coronary artery disease due to calcified coronary lesion Presence of stent in left circumflex coronary artery Presence of stent in LAD coronary artery Angina pectoris (CMS/HCC) -History of ST elevation myocardial infarction complicated by ventricular fibrillation requiring circumflex coronary artery stent May 2013.  -She had unstable angina due to progression of coronary artery disease requiring LAD stenting March 2014.  -Last Catheterization 10/18/2014 showed patent stents with 50% mid LAD stenosis with negative FFR.  -On December 2019 Cardiolite scan negative for ischemia. Ejection fraction 75%.  -She has had no recent angina with no use of nitroglycerinin the last year.  -Last LDL was 47 in August 2020 with goal LDL less than 70.  Excellent control of lipids with Crestor 10 mg daily. -Continue aspirin 81 mg daily.  -Follow-up office visit in 6 months.  Hypercholesterolemia -Last LDL was 47 in August 2020.  -Excellent control of lipids with Crestor 10 mg daily.  -Recommend to get yearly lipid panels.  Essential hypertension -Blood pressure reported to be normal -Continue Amlodipine 5 mg daily. -Carvedilol was stopped by neurologist and propranolol was started 10 mg BID.  Continue low-salt diet and exercise.  Airway hyperreactivity Patient continues to have some cough. Patient's cough may be related to her reflux. Will start patient on Protonix 40 mg in the morning and famotidine 40 mg in the evening. New prescription sent.   I spent 25 minutes providing patient care, education, and counseling. Any and all of the patient's/patient's family's questions on this issue have been answered, and I have made no promises or guarantees to the patient. The patient has also been advised to contact this office for worsening conditions or problems, and seek emergency medical treatment and/or call 911 if the patient deems either necessary.  Patient instructions: Please bring Copy of your labs and medications for our review and records.  Your LDL cholesterol goal < 70  Please register yourself to [facility] my charts for reviewing your records.   Medications Ordered This Encounter  Medications  . pantoprazole (PROTONIX) 40 MG tablet  Sig: Take 1 tablet (40 mg total) by mouth every morning.  Dispense: 90 tablet  Refill: 3  . famotidine (PEPCID) 40 MG tablet  Sig: Take 1 tablet (40 mg total) by mouth nightly.  Dispense: 90 tablet  Refill: 3   Return in about 6 months (around 06/24/2019).   (MU) General weight loss/lifestyle modification strategies discussed. (MU) Informal exercise measures discussed, e.g. taking stairs instead of elevator.   Portions of this record may be dictated using voice recognition software.  Variances in spelling and vocabulary are possible and unintentional. Some errors may not be recognized or corrected at time of dictation. Please read the chart with this in mind.  Electronically signed by: P.M. Manuella Ghazi, MD, [facility] 12/24/2018 10:24 PM  Frost obtained and performed the history, physical exam and medical decision  making elements that were entered into the chart. Documentation assistance was provided by a scribe. Signed by Clydell Hakim, Scribe.   Electronically signed by Brenda Home, MD at 12/24/2018 10:24 PM EST   Miscellaneous Notes - documented in this encounter Table of Contents for Miscellaneous Notes  Assessment & Plan Note - Clydell Hakim, [place] - 12/24/2018 3:27 PM EST  Assessment & Plan Note - Clydell Hakim, MA - 12/24/2018 3:27 PM EST  Assessment & Plan Note - Clydell Hakim, MA - 12/24/2018 3:27 PM EST  Assessment & Plan Note - Clydell Hakim, MA - 12/24/2018 3:26 PM EST  Assessment & Plan Note - Clydell Hakim, MA - 12/24/2018 3:25 PM EST    Assessment & Plan Note - Clydell Hakim, MA - 12/24/2018 3:27 PM EST Associated Problem(s): Airway hyperreactivity  Patient continues to have some cough. Patient's cough may be related to her reflux. Will start patient on Protonix 40 mg in the morning and famotidine 40 mg in the evening. New prescription sent.   Electronically signed by Brenda Home, MD at 12/24/2018 10:23 PM EST  Back to top of Miscellaneous

## 2018-12-29 ENCOUNTER — Ambulatory Visit: Payer: Self-pay | Admitting: Physician Assistant

## 2018-12-30 ENCOUNTER — Other Ambulatory Visit: Payer: Self-pay | Admitting: Urology

## 2018-12-30 MED ORDER — TRAMADOL HCL 50 MG PO TABS: 50.0000 mg | ORAL_TABLET | Freq: Four times a day (QID) | ORAL | 0 refills | Status: DC | PRN

## 2018-12-30 NOTE — Progress Notes (Signed)
Original Rx was printed.  It was shredded and re-sent electronically

## 2018-12-30 NOTE — Progress Notes (Signed)
Was scheduled for surgery today however had to cancel secondary to positive Covid test.  She has a ureteral stent.  She requested pain medication for stent pain.  Rx tramadol was sent to pharmacy.

## 2019-01-05 ENCOUNTER — Telehealth: Payer: Self-pay | Admitting: Urology

## 2019-01-05 NOTE — Telephone Encounter (Signed)
Pt. States she has taken the tramadol twice and both times she has had an allergic reaction which include rash and itching. Pt. Request a new medication for pain.

## 2019-01-06 MED ORDER — HYDROCODONE-ACETAMINOPHEN 5-325 MG PO TABS: 1.0000 | ORAL_TABLET | ORAL | 0 refills | Status: DC | PRN

## 2019-01-06 NOTE — Telephone Encounter (Signed)
Pt called back and states that she has already tried taking benadryl with Tramadol with no relief of severe itching. She states that in the past with back surgeries she has been able to tolerate hydrocodone in conjunction with benadryl. Please advise.

## 2019-01-06 NOTE — Telephone Encounter (Signed)
Called pt no answer. Left detailed message for pt to call back to discuss what pain meds have worked for her in the past as she has multiple allergies to several classes of pain medication. Also offered pt the option of taking benadryl with tramadol to cut down on interaction. 1st attempt.

## 2019-01-06 NOTE — Telephone Encounter (Signed)
Rx sent 

## 2019-01-15 ENCOUNTER — Other Ambulatory Visit: Payer: Self-pay | Admitting: Urology

## 2019-01-15 ENCOUNTER — Telehealth: Payer: Self-pay | Admitting: Urology

## 2019-01-15 ENCOUNTER — Other Ambulatory Visit: Payer: BC Managed Care – PPO

## 2019-01-15 ENCOUNTER — Other Ambulatory Visit: Payer: Self-pay | Admitting: Radiology

## 2019-01-15 ENCOUNTER — Other Ambulatory Visit: Payer: Self-pay

## 2019-01-15 DIAGNOSIS — Z01818 Encounter for other preprocedural examination: Secondary | ICD-10-CM

## 2019-01-15 DIAGNOSIS — N201 Calculus of ureter: Secondary | ICD-10-CM

## 2019-01-15 MED ORDER — OXYBUTYNIN CHLORIDE 5 MG PO TABS: 5.0000 mg | ORAL_TABLET | Freq: Three times a day (TID) | ORAL | 0 refills | Status: DC

## 2019-01-15 NOTE — Telephone Encounter (Signed)
Pt requests a refill for Oxybutnin 5 mg.

## 2019-01-16 ENCOUNTER — Other Ambulatory Visit: Payer: BC Managed Care – PPO

## 2019-01-18 LAB — CULTURE, URINE COMPREHENSIVE

## 2019-01-19 ENCOUNTER — Other Ambulatory Visit: Admission: RE | Admit: 2019-01-19 | Payer: BC Managed Care – PPO | Source: Ambulatory Visit

## 2019-01-19 MED ORDER — CEFAZOLIN SODIUM-DEXTROSE 2-4 GM/100ML-% IV SOLN
2.0000 g | INTRAVENOUS | Status: AC
  Administered 2019-01-20: 09:00:00 2 g via INTRAVENOUS

## 2019-01-20 ENCOUNTER — Ambulatory Visit: Payer: BC Managed Care – PPO

## 2019-01-20 ENCOUNTER — Ambulatory Visit: Payer: BC Managed Care – PPO | Admitting: Certified Registered Nurse Anesthetist

## 2019-01-20 ENCOUNTER — Encounter: Payer: Self-pay | Admitting: Urology

## 2019-01-20 ENCOUNTER — Encounter
Admission: RE | Disposition: A | Discharge status: Home / Self Care | Payer: Self-pay | Source: Home / Self Care | Attending: Urology

## 2019-01-20 ENCOUNTER — Ambulatory Visit
Admission: RE | Admit: 2019-01-20 | Discharge: 2019-01-20 | Disposition: A | Discharge status: Home / Self Care | Payer: BC Managed Care – PPO | Attending: Urology | Admitting: Urology

## 2019-01-20 ENCOUNTER — Other Ambulatory Visit: Payer: Self-pay

## 2019-01-20 DIAGNOSIS — Z888 Allergy status to other drugs, medicaments and biological substances status: Secondary | ICD-10-CM | POA: Insufficient documentation

## 2019-01-20 DIAGNOSIS — I252 Old myocardial infarction: Secondary | ICD-10-CM | POA: Insufficient documentation

## 2019-01-20 DIAGNOSIS — I110 Hypertensive heart disease with heart failure: Secondary | ICD-10-CM | POA: Insufficient documentation

## 2019-01-20 DIAGNOSIS — K219 Gastro-esophageal reflux disease without esophagitis: Secondary | ICD-10-CM | POA: Insufficient documentation

## 2019-01-20 DIAGNOSIS — Z7951 Long term (current) use of inhaled steroids: Secondary | ICD-10-CM | POA: Diagnosis not present

## 2019-01-20 DIAGNOSIS — Z955 Presence of coronary angioplasty implant and graft: Secondary | ICD-10-CM | POA: Diagnosis not present

## 2019-01-20 DIAGNOSIS — N200 Calculus of kidney: Secondary | ICD-10-CM

## 2019-01-20 DIAGNOSIS — I341 Nonrheumatic mitral (valve) prolapse: Secondary | ICD-10-CM | POA: Diagnosis not present

## 2019-01-20 DIAGNOSIS — Z79899 Other long term (current) drug therapy: Secondary | ICD-10-CM | POA: Diagnosis not present

## 2019-01-20 DIAGNOSIS — Z8249 Family history of ischemic heart disease and other diseases of the circulatory system: Secondary | ICD-10-CM | POA: Insufficient documentation

## 2019-01-20 DIAGNOSIS — F419 Anxiety disorder, unspecified: Secondary | ICD-10-CM | POA: Insufficient documentation

## 2019-01-20 DIAGNOSIS — E7800 Pure hypercholesterolemia, unspecified: Secondary | ICD-10-CM | POA: Insufficient documentation

## 2019-01-20 DIAGNOSIS — Z8674 Personal history of sudden cardiac arrest: Secondary | ICD-10-CM | POA: Insufficient documentation

## 2019-01-20 DIAGNOSIS — Z7982 Long term (current) use of aspirin: Secondary | ICD-10-CM | POA: Insufficient documentation

## 2019-01-20 DIAGNOSIS — I509 Heart failure, unspecified: Secondary | ICD-10-CM | POA: Insufficient documentation

## 2019-01-20 DIAGNOSIS — Z885 Allergy status to narcotic agent status: Secondary | ICD-10-CM | POA: Diagnosis not present

## 2019-01-20 DIAGNOSIS — I2510 Atherosclerotic heart disease of native coronary artery without angina pectoris: Secondary | ICD-10-CM | POA: Diagnosis not present

## 2019-01-20 DIAGNOSIS — Z8616 Personal history of COVID-19: Secondary | ICD-10-CM | POA: Diagnosis not present

## 2019-01-20 DIAGNOSIS — N201 Calculus of ureter: Secondary | ICD-10-CM

## 2019-01-20 DIAGNOSIS — J45909 Unspecified asthma, uncomplicated: Secondary | ICD-10-CM | POA: Insufficient documentation

## 2019-01-20 DIAGNOSIS — G4730 Sleep apnea, unspecified: Secondary | ICD-10-CM | POA: Diagnosis not present

## 2019-01-20 HISTORY — PX: CYSTOSCOPY W/ RETROGRADES: SHX1426

## 2019-01-20 HISTORY — PX: CYSTOSCOPY/URETEROSCOPY/HOLMIUM LASER/STENT PLACEMENT: SHX6546

## 2019-01-20 SURGERY — CYSTOSCOPY/URETEROSCOPY/HOLMIUM LASER/STENT PLACEMENT
Anesthesia: General | Site: Ureter | Laterality: Left

## 2019-01-20 MED ORDER — SUCCINYLCHOLINE CHLORIDE 20 MG/ML IJ SOLN
INTRAMUSCULAR | Status: DC | PRN
  Administered 2019-01-20: 100 mg via INTRAVENOUS

## 2019-01-20 MED ORDER — MIDAZOLAM HCL 2 MG/2ML IJ SOLN
INTRAMUSCULAR | Status: AC
  Filled 2019-01-20: qty 2

## 2019-01-20 MED ORDER — IPRATROPIUM-ALBUTEROL 0.5-2.5 (3) MG/3ML IN SOLN
RESPIRATORY_TRACT | Status: AC
  Administered 2019-01-20: 3 mL via RESPIRATORY_TRACT
  Filled 2019-01-20: qty 3

## 2019-01-20 MED ORDER — PHENYLEPHRINE HCL (PRESSORS) 10 MG/ML IV SOLN
INTRAVENOUS | Status: AC
  Filled 2019-01-20: qty 1

## 2019-01-20 MED ORDER — FENTANYL CITRATE (PF) 100 MCG/2ML IJ SOLN
INTRAMUSCULAR | Status: AC
  Administered 2019-01-20: 25 ug via INTRAVENOUS
  Filled 2019-01-20: qty 2

## 2019-01-20 MED ORDER — LIDOCAINE HCL (CARDIAC) PF 100 MG/5ML IV SOSY
PREFILLED_SYRINGE | INTRAVENOUS | Status: DC | PRN
  Administered 2019-01-20: 100 mg via INTRAVENOUS

## 2019-01-20 MED ORDER — PROPOFOL 10 MG/ML IV BOLUS
INTRAVENOUS | Status: DC | PRN
  Administered 2019-01-20: 50 mg via INTRAVENOUS
  Administered 2019-01-20: 150 mg via INTRAVENOUS

## 2019-01-20 MED ORDER — FENTANYL CITRATE (PF) 100 MCG/2ML IJ SOLN
INTRAMUSCULAR | Status: DC | PRN
  Administered 2019-01-20: 50 ug via INTRAVENOUS
  Administered 2019-01-20 (×2): 25 ug via INTRAVENOUS

## 2019-01-20 MED ORDER — ROCURONIUM BROMIDE 50 MG/5ML IV SOLN
INTRAVENOUS | Status: AC
  Filled 2019-01-20: qty 1

## 2019-01-20 MED ORDER — DEXAMETHASONE SODIUM PHOSPHATE 10 MG/ML IJ SOLN
INTRAMUSCULAR | Status: AC
  Filled 2019-01-20: qty 1

## 2019-01-20 MED ORDER — DEXAMETHASONE SODIUM PHOSPHATE 10 MG/ML IJ SOLN
INTRAMUSCULAR | Status: DC | PRN
  Administered 2019-01-20: 10 mg via INTRAVENOUS

## 2019-01-20 MED ORDER — MIDAZOLAM HCL 2 MG/2ML IJ SOLN
INTRAMUSCULAR | Status: DC | PRN
  Administered 2019-01-20: 2 mg via INTRAVENOUS

## 2019-01-20 MED ORDER — IPRATROPIUM-ALBUTEROL 0.5-2.5 (3) MG/3ML IN SOLN: 3.0000 mL | Freq: Once | RESPIRATORY_TRACT | Status: AC

## 2019-01-20 MED ORDER — ACETAMINOPHEN 10 MG/ML IV SOLN: 1000.0000 mg | Freq: Once | INTRAVENOUS | Status: DC | PRN

## 2019-01-20 MED ORDER — LACTATED RINGERS IV SOLN: INTRAVENOUS | Status: DC

## 2019-01-20 MED ORDER — PROPOFOL 10 MG/ML IV BOLUS
INTRAVENOUS | Status: AC
  Filled 2019-01-20: qty 20

## 2019-01-20 MED ORDER — FENTANYL CITRATE (PF) 100 MCG/2ML IJ SOLN
INTRAMUSCULAR | Status: AC
  Filled 2019-01-20: qty 2

## 2019-01-20 MED ORDER — LIDOCAINE HCL (PF) 2 % IJ SOLN
INTRAMUSCULAR | Status: AC
  Filled 2019-01-20: qty 5

## 2019-01-20 MED ORDER — IOHEXOL 180 MG/ML  SOLN
INTRAMUSCULAR | Status: DC | PRN
  Administered 2019-01-20: 20 mL

## 2019-01-20 MED ORDER — ONDANSETRON HCL 4 MG/2ML IJ SOLN
INTRAMUSCULAR | Status: DC | PRN
  Administered 2019-01-20: 4 mg via INTRAVENOUS

## 2019-01-20 MED ORDER — SUGAMMADEX SODIUM 500 MG/5ML IV SOLN
INTRAVENOUS | Status: AC
  Filled 2019-01-20: qty 5

## 2019-01-20 MED ORDER — HYDROCODONE-ACETAMINOPHEN 5-325 MG PO TABS: 1.0000 | ORAL_TABLET | Freq: Four times a day (QID) | ORAL | 0 refills | Status: DC | PRN

## 2019-01-20 MED ORDER — ONDANSETRON HCL 4 MG/2ML IJ SOLN: 4.0000 mg | Freq: Once | INTRAMUSCULAR | Status: DC | PRN

## 2019-01-20 MED ORDER — PHENYLEPHRINE HCL (PRESSORS) 10 MG/ML IV SOLN
INTRAVENOUS | Status: DC | PRN
  Administered 2019-01-20 (×2): 200 ug via INTRAVENOUS
  Administered 2019-01-20 (×3): 100 ug via INTRAVENOUS
  Administered 2019-01-20: 200 ug via INTRAVENOUS

## 2019-01-20 MED ORDER — FENTANYL CITRATE (PF) 100 MCG/2ML IJ SOLN
25.0000 ug | INTRAMUSCULAR | Status: AC | PRN
  Administered 2019-01-20 (×4): 25 ug via INTRAVENOUS

## 2019-01-20 MED ORDER — SUCCINYLCHOLINE CHLORIDE 20 MG/ML IJ SOLN
INTRAMUSCULAR | Status: AC
  Filled 2019-01-20: qty 1

## 2019-01-20 MED ORDER — LEVOFLOXACIN 500 MG PO TABS: 500.0000 mg | ORAL_TABLET | Freq: Every day | ORAL | 0 refills | Status: AC

## 2019-01-20 MED ORDER — SUGAMMADEX SODIUM 200 MG/2ML IV SOLN
INTRAVENOUS | Status: AC
  Filled 2019-01-20: qty 2

## 2019-01-20 MED ORDER — ROCURONIUM BROMIDE 100 MG/10ML IV SOLN
INTRAVENOUS | Status: DC | PRN
  Administered 2019-01-20 (×3): 10 mg via INTRAVENOUS
  Administered 2019-01-20: 40 mg via INTRAVENOUS
  Administered 2019-01-20 (×2): 10 mg via INTRAVENOUS

## 2019-01-20 MED ORDER — CEFAZOLIN SODIUM-DEXTROSE 2-4 GM/100ML-% IV SOLN
INTRAVENOUS | Status: AC
  Filled 2019-01-20: qty 100

## 2019-01-20 MED ORDER — SUGAMMADEX SODIUM 200 MG/2ML IV SOLN
INTRAVENOUS | Status: DC | PRN
  Administered 2019-01-20: 250 mg via INTRAVENOUS

## 2019-01-20 MED ORDER — ONDANSETRON HCL 4 MG/2ML IJ SOLN
INTRAMUSCULAR | Status: AC
  Filled 2019-01-20: qty 2

## 2019-01-20 SURGICAL SUPPLY — 28 items
BAG DRAIN CYSTO-URO LG1000N (MISCELLANEOUS) ×2 IMPLANT
BASKET ZERO TIP 1.9FR (BASKET) ×2 IMPLANT
BRUSH SCRUB EZ 1% IODOPHOR (MISCELLANEOUS) ×2 IMPLANT
CATH URETL 5X70 OPEN END (CATHETERS) IMPLANT
CNTNR SPEC 2.5X3XGRAD LEK (MISCELLANEOUS) ×1
CONT SPEC 4OZ STER OR WHT (MISCELLANEOUS) ×1
CONTAINER SPEC 2.5X3XGRAD LEK (MISCELLANEOUS) ×1 IMPLANT
DRAPE UTILITY 15X26 TOWEL STRL (DRAPES) ×2 IMPLANT
FIBER LASER TRAC TIP (UROLOGICAL SUPPLIES) ×2 IMPLANT
GLOVE BIO SURGEON STRL SZ8 (GLOVE) ×2 IMPLANT
GOWN STRL REUS W/ TWL LRG LVL3 (GOWN DISPOSABLE) ×1 IMPLANT
GOWN STRL REUS W/ TWL XL LVL3 (GOWN DISPOSABLE) ×1 IMPLANT
GOWN STRL REUS W/TWL LRG LVL3 (GOWN DISPOSABLE) ×1
GOWN STRL REUS W/TWL XL LVL3 (GOWN DISPOSABLE) ×1
GUIDEWIRE STR DUAL SENSOR (WIRE) ×4 IMPLANT
INFUSOR MANOMETER BAG 3000ML (MISCELLANEOUS) ×2 IMPLANT
INTRODUCER DILATOR DOUBLE (INTRODUCER) ×2 IMPLANT
KIT TURNOVER CYSTO (KITS) ×2 IMPLANT
PACK CYSTO AR (MISCELLANEOUS) ×2 IMPLANT
SET CYSTO W/LG BORE CLAMP LF (SET/KITS/TRAYS/PACK) ×2 IMPLANT
SHEATH URETERAL 12FRX35CM (MISCELLANEOUS) ×2 IMPLANT
SOL .9 NS 3000ML IRR  AL (IV SOLUTION) ×1
SOL .9 NS 3000ML IRR UROMATIC (IV SOLUTION) ×1 IMPLANT
STENT URET 6FRX24 CONTOUR (STENTS) IMPLANT
STENT URET 6FRX26 CONTOUR (STENTS) ×2 IMPLANT
SURGILUBE 2OZ TUBE FLIPTOP (MISCELLANEOUS) ×2 IMPLANT
VALVE UROSEAL ADJ ENDO (VALVE) ×2 IMPLANT
WATER STERILE IRR 1000ML POUR (IV SOLUTION) ×2 IMPLANT

## 2019-01-20 NOTE — H&P (Signed)
01/20/2019 8:54 AM   Brenda Craig 23-Jan-1960 [id number]  Referring provider: No referring provider defined for this encounter.   HPI: 59 y.o. female admitted 12/13/2018 with a 15 mm left UPJ calculus with obstruction and sepsis.  She underwent urgent stent placement.  She was scheduled for ureteroscopic removal in mid December however was postponed secondary to positive coronavirus.  She presently has mild to moderate stent irritative voiding symptoms.  Denies fever or chills.  Preoperative urine culture was negative.   PMH: Past Medical History:  Diagnosis Date  . Abnormal Pap smear of cervix 1988  . Acid reflux 01/17/2013  . Allergic rhinitis   . Anemia   . Anxiety   . Asthma   . Celiac disease   . Cervical dysplasia 1988  . CHF (congestive heart failure) ([place])   . Complication of anesthesia    bp drops after anesthesia and has to be kept overnight  . Coronary artery disease   . Heart trouble   . History of cardiac arrest   . History of mammogram 03/25/2013; 12/07/14   BIRADS 1; NEG  . History of Papanicolaou smear of cervix 03/20/12; 12/28/15   -/-; -/-  . Hypercholesteremia   . Hypertension   . Menopausal symptoms   . MI (myocardial infarction) ([place])   . Mitral valve prolapse   . PONV (postoperative nausea and vomiting)    nausea only  . Sleep apnea   . Vulvovaginitis    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    Surgical History: Past Surgical History:  Procedure Laterality Date  . ABLATION  2013   [facility]  . BACK SURGERY    . BACK SURGERY  2016; 2017   L4, L5  . CARDIAC SURGERY  03/19/12; 10/2014   HEART CATH AND STENT  . [place]; 1989  . COLD KNIFE CONE BIOPSY  1988  . COLONOSCOPY  08/2010   16 POLYPS (ALL BENIGN) DX C CELIAC DISEASE  . COMBINED HYSTEROSCOPY DIAGNOSTIC / D&C  2013   [facility]  . CYSTOSCOPY W/ URETERAL STENT PLACEMENT Left 12/13/2018   Procedure: CYSTOSCOPY WITH RETROGRADE PYELOGRAM/URETERAL STENT  PLACEMENT;  Surgeon: Irine Seal, MD;  Location: [facility] ORS;  Service: Urology;  Laterality: Left;  . DILATION AND CURETTAGE OF UTERUS  2001  . ESOPHAGOGASTRODUODENOSCOPY  08/2010   DX C CELIAC DISEASE  . HEART STENTS  05/27/2011  . HIP SURGERY  2016   RIGHT IT BAND AND BURSA REMOVAL  . OOPHORECTOMY Right 1982    Home Medications:  . albuterol (PROVENTIL) (2.5 MG/3ML) 0.083% nebulizer solution Take 3 mLs (2.5 mg total) by nebulization every 6 (six) hours as needed for wheezing or shortness of breath. 75 mL 12  . amLODipine (NORVASC) 5 MG tablet Take 1 tablet (5 mg total) by mouth daily. 30 tablet 0  . budesonide (PULMICORT) 0.5 MG/2ML nebulizer solution Take 2 mLs (0.5 mg total) by nebulization 2 (two) times daily. 75 mL 12  . cetirizine (ZYRTEC) 10 MG tablet TAKE 1 TABLET BY MOUTH EVERY DAY 90 tablet 0  . chlorpheniramine-HYDROcodone (TUSSIONEX) 10-8 MG/5ML SUER TAKE 5 MILLILITERS BY ORAL ROUTE EVERY 12 HOURS    . clotrimazole-betamethasone (LOTRISONE) cream AAA BID prn sx for up to 2 wks 45 g 0  . [facility] ASPIRIN ADULT LOW DOSE 81 MG chewable tablet Chew 81 mg by mouth daily.    . isosorbide mononitrate (IMDUR) 30 MG 24 hr tablet Take 15 mg  by mouth daily.     Marland Kitchen LORazepam (ATIVAN) 1 MG tablet Take 1 mg by mouth 2 (two) times daily.     . mometasone (NASONEX) 50 MCG/ACT nasal spray PLACE 2 SPRAYS INTO THE NOSE DAILY. 17 g 1  . montelukast (SINGULAIR) 10 MG tablet Take 10 mg by mouth at bedtime.    Marland Kitchen NITROSTAT 0.4 MG SL tablet Place 0.4 mg under the tongue every 5 (five) minutes as needed for chest pain.     . pantoprazole (PROTONIX) 20 MG tablet Take 20 mg by mouth 2 (two) times daily.     Marland Kitchen PROAIR HFA 108 (90 Base) MCG/ACT inhaler Inhale 2 puffs into the lungs every 6 (six) hours as needed.  5  . propranolol (INDERAL) 10 MG tablet Take by mouth.    . rosuvastatin (CRESTOR) 10 MG tablet Take 10 mg by mouth at bedtime.     . SYMBICORT 160-4.5 MCG/ACT inhaler TAKE 2 PUFFS BY  MOUTH TWICE A DAY 10.2 Inhaler 12  . ticagrelor (BRILINTA) 60 MG TABS tablet Take 60 mg by mouth 2 (two) times daily.     . Tiotropium Bromide Monohydrate (SPIRIVA RESPIMAT) 1.25 MCG/ACT AERS Inhale 2 puffs into the lungs daily. 1 Inhaler 5    Allergies:  Allergies  Allergen Reactions  . Other     Gluten allergy  . Codeine Nausea And Vomiting and Rash  . Nexium [Esomeprazole Magnesium] Palpitations  . Omeprazole Magnesium Palpitations  . Oxycodone-Acetaminophen Itching, Nausea And Vomiting and Rash  . Percocet [Oxycodone-Acetaminophen] Itching, Nausea And Vomiting and Rash  . Tramadol Rash    Family History: Family History  Problem Relation Age of Onset  . CAD Father   . Transient ischemic attack Father   . Diabetes Father   . Cerebrovascular Accident Father   . Heart disease Father        M-MVP; F BETA;BLOCKERS  . Hypothyroidism Father   . Hyperthyroidism Mother   . Cancer Maternal Uncle        KIDNEY  . Uterine cancer Maternal Aunt 69    Social History:  reports that she has never smoked. She has never used smokeless tobacco. She reports that she does not drink alcohol or use drugs.  ROS: Denies fever, chills, shortness of breath.  Otherwise no significant change from 12/13/2018  Physical Exam: BP (!) 132/95   Pulse (!) 105   Temp 97.9 F (36.6 C) (Tympanic)   Resp 18   Ht 5' 6"  (1.676 m)   Wt 107.4 kg   SpO2 100%   BMI 38.22 kg/m   Constitutional:  Alert and oriented, No acute distress. HEENT: [place] AT, moist mucus membranes.  Trachea midline, no masses. Cardiovascular: No clubbing, cyanosis, or edema. Respiratory: Normal respiratory effort, no increased work of breathing. GI: Abdomen is soft, nontender, nondistended, no abdominal masses GU: No CVA tenderness Lymph: No cervical or inguinal lymphadenopathy. Skin: No rashes, bruises or suspicious lesions. Neurologic: Grossly intact, no focal deficits, moving all 4 extremities. Psychiatric: Normal mood and  affect.   Assessment & Plan:    - Left nephrolithiasis 59 y.o. female status post urgent stenting for a 15 mm obstructing left UPJ calculus with sepsis.  She presents today for definitive stone treatment with ureteroscopy.  The indications and nature of the planned procedure were discussed as well as the potential  benefits and expected outcome.  Alternatives have been discussed in detail. The most common complications and side effects were discussed including but not limited to  infection/sepsis; blood loss; damage to urethra, bladder, ureter, kidney; need for multiple surgeries; need for prolonged stent placement as well as general anesthesia risks. Although uncommon she was also informed of the possibility that the calculus may not be able to be treated due to inability to obtain access to the upper ureter. In that event she would require an alternative procedure. All of her questions were answered and she desires to proceed.     Abbie Sons, [place] [address], [place], [place] [address] [phone]

## 2019-01-20 NOTE — Discharge Instructions (Signed)
AMBULATORY SURGERY  DISCHARGE INSTRUCTIONS   1) The drugs that you were given will stay in your system until tomorrow so for the next 24 hours you should not:  A) Drive an automobile B) Make any legal decisions C) Drink any alcoholic beverage   2) You may resume regular meals tomorrow.  Today it is better to start with liquids and gradually work up to solid foods.  You may eat anything you prefer, but it is better to start with liquids, then soup and crackers, and gradually work up to solid foods.   3) Please notify your doctor immediately if you have any unusual bleeding, trouble breathing, redness and pain at the surgery site, drainage, fever, or pain not relieved by medication.    4) Additional Instructions:        Please contact your physician with any problems or Same Day Surgery at [phone], Monday through Friday 6 am to 4 pm, or [place] [facility] number at [phone].DISCHARGE INSTRUCTIONS FOR KIDNEY STONE/URETERAL STENT   MEDICATIONS:  1. Resume all your other meds from home.  2.  AZO (over-the-counter) can help with the burning/stinging when you urinate. 3.  Hydrocodone is for moderate/severe pain, Rx was sent to your pharmacy. 4.  Antibiotic Rx was sent to pharmacy  ACTIVITY:  1. May resume regular activities in 24 hours. 2. No driving while on narcotic pain medications  3. Drink plenty of water  4. Continue to walk at home - you can still get blood clots when you are at home, so keep active, but don't over do it.  5. May return to work/school tomorrow or when you feel ready   BATHING:  1. You can shower.  SIGNS/SYMPTOMS TO CALL:  Please call us if you have a fever greater than 101.5, uncontrolled nausea/vomiting, uncontrolled pain, dizziness, unable to urinate, excessively bloody urine, chest pain, shortness of breath, leg swelling, leg pain, or any other concerns or questions.   Urinary frequency, urgency, bladder spasm and blood in the  urine are common postoperative symptoms.  You can reach [place] at [phone].   FOLLOW-UP:  1. You will be contacted for an appointment for stent removal next week.

## 2019-01-20 NOTE — Anesthesia Preprocedure Evaluation (Addendum)
Anesthesia Evaluation  Patient identified by MRN, date of birth, ID band Patient awake    Reviewed: Allergy & Precautions, NPO status , Patient's Chart, lab work & pertinent test results  History of Anesthesia Complications (+) PONV and history of anesthetic complications  Airway Mallampati: II  TM Distance: >3 FB Neck ROM: Full    Dental no notable dental hx. (+) Teeth Intact, Dental Advisory Given   Pulmonary neg pulmonary ROS, asthma , sleep apnea and Continuous Positive Airway Pressure Ventilation , neg COPD, Patient abstained from smoking.Not current smoker,    Pulmonary exam normal breath sounds clear to auscultation       Cardiovascular Exercise Tolerance: Good METShypertension, + CAD, + Past MI and + Cardiac Stents  negative cardio ROS  (-) dysrhythmias  Rhythm:Regular Rate:Normal - Systolic murmurs TTE 0000000 normal   Neuro/Psych negative neurological ROS  negative psych ROS   GI/Hepatic hiatal hernia, PUD, GERD  Medicated and Controlled,(+)     (-) substance abuse  ,   Endo/Other  neg diabetes  Renal/GU negative Renal ROS     Musculoskeletal   Abdominal   Peds  Hematology   Anesthesia Other Findings Past Medical History: 1988: Abnormal Pap smear of cervix 01/17/2013: Acid reflux No date: Allergic rhinitis No date: Anemia No date: Anxiety No date: Asthma No date: Celiac disease 1988: Cervical dysplasia No date: CHF (congestive heart failure) ([facility]) No date: Complication of anesthesia     Comment:  bp drops after anesthesia and has to be kept overnight No date: Coronary artery disease No date: Heart trouble No date: History of cardiac arrest 03/25/2013; 12/07/14: History of mammogram     Comment:  BIRADS 1; NEG 03/20/12; 12/28/15: History of Papanicolaou smear of cervix     Comment:  -/-; -/- No date: Hypercholesteremia No date: Hypertension No date: Menopausal symptoms No date: MI (myocardial  infarction) ([place]) No date: Mitral valve prolapse No date: PONV (postoperative nausea and vomiting)     Comment:  nausea only No date: Sleep apnea No date: Vulvovaginitis     Comment:  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  Reproductive/Obstetrics                            Anesthesia Physical Anesthesia Plan  ASA: III  Anesthesia Plan: General   Post-op Pain Management:    Induction: Intravenous  PONV Risk Score and Plan: 4 or greater and Ondansetron, Dexamethasone and Midazolam  Airway Management Planned: LMA  Additional Equipment: None  Intra-op Plan:   Post-operative Plan: Extubation in OR  Informed Consent: I have reviewed the patients History and Physical, chart, labs and discussed the procedure including the risks, benefits and alternatives for the proposed anesthesia with the patient or authorized representative who has indicated his/her understanding and acceptance.     Dental advisory given  Plan Discussed with: CRNA and Surgeon  Anesthesia Plan Comments: (Discussed risks of anesthesia with patient, including PONV, sore throat, lip/dental damage. Rare risks discussed as well, such as cardiorespiratory sequelae. Patient understands.)        Anesthesia Quick Evaluation

## 2019-01-20 NOTE — Anesthesia Procedure Notes (Signed)
Procedure Name: Intubation Date/Time: 01/20/2019 9:17 AM Performed by: Caryl Asp, CRNA Pre-anesthesia Checklist: Patient identified, Patient being monitored, Timeout performed, Emergency Drugs available and Suction available Patient Re-evaluated:Patient Re-evaluated prior to induction Oxygen Delivery Method: Circle system utilized Preoxygenation: Pre-oxygenation with 100% oxygen Induction Type: IV induction Ventilation: Mask ventilation without difficulty Laryngoscope Size: 3 and McGraph Grade View: Grade I Tube type: Oral Tube size: 7.0 mm Number of attempts: 1 Airway Equipment and Method: Stylet Placement Confirmation: ETT inserted through vocal cords under direct vision,  positive ETCO2 and breath sounds checked- equal and bilateral Secured at: 20 cm Tube secured with: Tape Dental Injury: Teeth and Oropharynx as per pre-operative assessment  Comments: Attempted 4.0 LMA. Unable to seal. EET placed

## 2019-01-20 NOTE — Anesthesia Postprocedure Evaluation (Signed)
Anesthesia Post Note  Patient: Brenda Craig  Procedure(s) Performed: CYSTOSCOPY/URETEROSCOPY/HOLMIUM LASER/STENT EXCHANGE (Left Ureter) CYSTOSCOPY WITH RETROGRADE PYELOGRAM (Left Ureter)  Patient location during evaluation: PACU Anesthesia Type: General Level of consciousness: awake and alert Pain management: pain level controlled Vital Signs Assessment: post-procedure vital signs reviewed and stable Respiratory status: spontaneous breathing, nonlabored ventilation, respiratory function stable and patient connected to nasal cannula oxygen Cardiovascular status: blood pressure returned to baseline and stable Postop Assessment: no apparent nausea or vomiting Anesthetic complications: no     Last Vitals:  Vitals:   01/20/19 1200 01/20/19 1214  BP: 114/77 (!) 120/99  Pulse: (!) 105 (!) 107  Resp: 14 (!) 28  Temp:    SpO2: 94% 97%    Last Pain:  Vitals:   01/20/19 1215  TempSrc:   PainSc: 7                  Arita Miss

## 2019-01-20 NOTE — Interval H&P Note (Signed)
History and Physical Interval Note:  01/20/2019 8:55 AM  Brenda Craig  has presented today for surgery, with the diagnosis of left ureteral stone.  The various methods of treatment have been discussed with the patient and family. After consideration of risks, benefits and other options for treatment, the patient has consented to  Procedure(s): CYSTOSCOPY/URETEROSCOPY/HOLMIUM LASER/STENT EXCHANGE (Left) as a surgical intervention.  The patient's history has been reviewed, patient examined, no change in status, stable for surgery.  I have reviewed the patient's chart and labs.  Questions were answered to the patient's satisfaction.     Elizabeth

## 2019-01-20 NOTE — Transfer of Care (Signed)
Immediate Anesthesia Transfer of Care Note  Patient: Brenda Craig  Procedure(s) Performed: CYSTOSCOPY/URETEROSCOPY/HOLMIUM LASER/STENT EXCHANGE (Left Ureter) CYSTOSCOPY WITH RETROGRADE PYELOGRAM (Left Ureter)  Patient Location: PACU  Anesthesia Type:General  Level of Consciousness: awake, alert  and oriented  Airway & Oxygen Therapy: Patient Spontanous Breathing and Patient connected to face mask oxygen  Post-op Assessment: Report given to RN and Post -op Vital signs reviewed and stable  Post vital signs: Reviewed and stable  Last Vitals:  Vitals Value Taken Time  BP 140/119 01/20/19 1145  Temp    Pulse 121 01/20/19 1148  Resp 14 01/20/19 1148  SpO2 92 % 01/20/19 1148  Vitals shown include unvalidated device data.  Last Pain:  Vitals:   01/20/19 0834  TempSrc:   PainSc: 0-No pain         Complications: No apparent anesthesia complications

## 2019-01-20 NOTE — Op Note (Addendum)
Preoperative diagnosis: Left nephrolithiasis   Postoperative diagnosis: Left nephrolithiasis  Procedure:  1. Cystoscopy 2. Left ureteroscopy and stone removal 3. Ureteroscopic laser lithotripsy 4. Left ureteral stent placement (6FR 26 cm) 5. Left retrograde pyelography with interpretation  Surgeon: Nicki Reaper C. Nakita Santerre, M.D.  Anesthesia: General  Complications: None  Intraoperative findings:  1. Left retrograde pyelography post procedure showed no filling defects, stone fragments or contrast extravasation  EBL: Minimal  Specimens: 1. Calculus fragments for analysis   Indication: Brenda Craig is a 59 y.o. female admitted 12/13/2018 with a 15 mm left UPJ calculus with obstruction and sepsis.  She underwent urgent stent placement and presents today for definitive stone treatment.  After reviewing the management options for treatment, the patient elected to proceed with the above surgical procedure(s). We have discussed the potential benefits and risks of the procedure, side effects of the proposed treatment, the likelihood of the patient achieving the goals of the procedure, and any potential problems that might occur during the procedure or recuperation. Informed consent has been obtained.  Description of procedure:  The patient was taken to the operating room and general anesthesia was induced.  The patient was placed in the dorsal lithotomy position, prepped and draped in the usual sterile fashion, and preoperative antibiotics were administered. A preoperative time-out was performed.   A 21 French cystoscope was lubricated and passed per urethra.  Panendoscopy was performed and the bladder mucosa showed no erythema, solid or papillary lesions with the exception of inflammatory changes at the left ureteral orifice secondary to her indwelling stent..  Attention was turned to the left ureteral orifice and the stent was grasped with endoscopic forceps and brought out to the urethral  meatus.  A 0.038 Sensor wire was then placed through the stent and advanced to the renal pelvis under fluoroscopic guidance.  The renal pelvic calculus was very radiodense.    A dual-lumen catheter was placed over the sensor wire and a second sensor wire was placed in a similar fashion.  A 12/14 French ureteral access sheath was placed over the working wire under fluoroscopic guidance without difficulty.  A dual channel digital flexible ureteroscope was placed through the access sheath and advanced into the renal pelvis without difficulty where the calculus was identified.    A 200 micron holmium laser fiber was placed through the ureteroscope and the stone was dusted at a settings of 0.2J and frequency of 40 Hz and subsequently increased to 0.4/40.   Several larger stone fragments were then removed from the collecting system with a zero tip nitinol basket.  Retrograde pyelogram was performed and each calyx was sequentially examined under fluoroscopic guidance and no significant size fragments were identified.  The ureteral access sheath and ureteroscope were removed in tandem and the ureter showed no evidence of injury or perforation.  The wire was then backloaded through the cystoscope and a 6FR/26 cm Contour ureteral stent was advance over the wire using Seldinger technique. The wire was then removed with an adequate stent curl noted in the renal pelvis as well as in the bladder.  The bladder was then emptied and the cystoscope was removed.  The patient appeared to tolerate the procedure well and without complications.  After anesthetic reversal the patient was transported to the PACU in stable condition.   Plan: Her ureteral stent will remain indwelling for 7 days.  1 week follow-up with [facility]/stent removal  John Giovanni, MD

## 2019-01-21 ENCOUNTER — Telehealth: Payer: Self-pay | Admitting: Urology

## 2019-01-21 ENCOUNTER — Other Ambulatory Visit: Payer: Self-pay | Admitting: Internal Medicine

## 2019-01-21 NOTE — Telephone Encounter (Signed)
-----   Message from Abbie Sons, MD sent at 01/20/2019 12:37 PM EST ----- Regarding: Stent removal Please schedule cystoscopy with stent removal next week

## 2019-01-21 NOTE — Telephone Encounter (Signed)
App made patient is aware

## 2019-01-23 LAB — ANTIFUNGAL SUSC, ANIDULAFUNGIN

## 2019-01-23 LAB — SPECIMEN STATUS REPORT

## 2019-01-23 LAB — ORGANISM IDENTIFICATION, YEAST

## 2019-01-26 LAB — CALCULI, WITH PHOTOGRAPH (CLINICAL LAB)
Calcium Oxalate Dihydrate: 20 %
Calcium Oxalate Monohydrate: 70 %
Hydroxyapatite: 10 %
Weight Calculi: 16 mg

## 2019-01-29 ENCOUNTER — Other Ambulatory Visit: Payer: Self-pay

## 2019-01-29 ENCOUNTER — Ambulatory Visit: Payer: BC Managed Care – PPO | Admitting: Urology

## 2019-01-29 ENCOUNTER — Encounter: Payer: Self-pay | Admitting: Urology

## 2019-01-29 VITALS — BP 155/101 | HR 121 | Ht 65.0 in | Wt 220.0 lb

## 2019-01-29 DIAGNOSIS — N201 Calculus of ureter: Secondary | ICD-10-CM

## 2019-01-29 MED ORDER — CIPROFLOXACIN HCL 500 MG PO TABS
500.0000 mg | ORAL_TABLET | Freq: Once | ORAL | Status: AC
  Administered 2019-01-29: 500 mg via ORAL

## 2019-01-29 NOTE — Progress Notes (Signed)
Indications: Patient is 59 y.o., female status post ureteroscopic removal of a 15 mm left UPJ calculus on 01/20/2019.  Her only complaint has been stent irritation.  The patient is presenting today for stent removal.  Procedure:  Flexible Cystoscopy with stent removal (69437)  Timeout was performed and the correct patient, procedure and participants were identified.    Description:  The patient was prepped and draped in the usual sterile fashion. Flexible cystosopy was performed.  The stent was visualized, grasped, and removed intact without difficulty. The patient tolerated the procedure well.  A single dose of oral antibiotics was given.  Complications:  None  Plan:  -Instructed to call for fever/flank pain post removal -Stone analysis was 70% calcium oxalate monohydrate/20% calcium oxalate dihydrate/10% hydroxyapatite. -Recommend a metabolic evaluation. -Follow-up 4 weeks for recheck with Ivor Costa, MD

## 2019-01-30 LAB — URINALYSIS, COMPLETE
Bilirubin, UA: NEGATIVE
Glucose, UA: NEGATIVE
Ketones, UA: NEGATIVE
Nitrite, UA: NEGATIVE
Specific Gravity, UA: >1.03 — ABNORMAL HIGH (ref 1.005–1.030)
Urobilinogen, Ur: 0.2 mg/dL (ref 0.2–1.0)
pH, UA: 5 (ref 5.0–7.5)

## 2019-01-30 LAB — MICROSCOPIC EXAMINATION: RBC, Urine: >30 /hpf — AB (ref 0–2)

## 2019-02-01 ENCOUNTER — Encounter: Payer: Self-pay | Admitting: Urology

## 2019-02-09 ENCOUNTER — Other Ambulatory Visit: Payer: BC Managed Care – PPO

## 2019-02-09 ENCOUNTER — Other Ambulatory Visit: Payer: Self-pay | Admitting: Urology

## 2019-02-09 ENCOUNTER — Other Ambulatory Visit: Payer: Self-pay

## 2019-02-09 DIAGNOSIS — N201 Calculus of ureter: Secondary | ICD-10-CM

## 2019-02-10 ENCOUNTER — Encounter: Payer: Self-pay | Admitting: Physician Assistant

## 2019-02-10 ENCOUNTER — Ambulatory Visit: Payer: BC Managed Care – PPO | Admitting: Physician Assistant

## 2019-02-10 VITALS — BP 106/73 | HR 91 | Ht 65.0 in | Wt 215.0 lb

## 2019-02-10 DIAGNOSIS — Z87442 Personal history of urinary calculi: Secondary | ICD-10-CM

## 2019-02-10 DIAGNOSIS — R109 Unspecified abdominal pain: Secondary | ICD-10-CM

## 2019-02-10 NOTE — Progress Notes (Signed)
02/10/2019 12:41 PM   Brenda Craig Jan 02, 1961 [id number]  CC: Flank pain, dysuria  HPI: Brenda Craig is a 59 y.o. female who presents today for evaluation of postoperative flank pain and dysuria.  She underwent cystoscopy and left ureteroscopy with laser lithotripsy and stent placement with Dr. Bernardo Heater on 01/20/2019 for management of a 15 mm left UPJ calculus associated with obstruction and sepsis.  Stent was removed via cystoscopy on POD9.  Preoperative urine culture on 01/15/2019 was negative.  Per CT stone study on 12/13/2018, patient is expected to have residual bilateral nonobstructing renal stones.  Today, she reports continued left flank soreness as well as intermittent sharp left flank pain.  She denies fever, chills, nausea, vomiting, and gross hematuria.  She did struggle with stent pain while her urinary stent was in place and has continued to take oxybutynin 3 times daily as recently as today for management of these symptoms.  In-office UA today positive for 1+ blood, 1+ protein, and 2+ leukocyte esterase; urine microscopy with >30 WBCs/HPF.  PMH: Past Medical History:  Diagnosis Date  . Abnormal Pap smear of cervix 1988  . Acid reflux 01/17/2013  . Allergic rhinitis   . Anemia   . Anxiety   . Asthma   . Celiac disease   . Cervical dysplasia 1988  . CHF (congestive heart failure) ([place])   . Complication of anesthesia    bp drops after anesthesia and has to be kept overnight  . Coronary artery disease   . Heart trouble   . History of cardiac arrest   . History of mammogram 03/25/2013; 12/07/14   BIRADS 1; NEG  . History of Papanicolaou smear of cervix 03/20/12; 12/28/15   -/-; -/-  . Hypercholesteremia   . Hypertension   . Menopausal symptoms   . MI (myocardial infarction) ([place])   . Mitral valve prolapse   . PONV (postoperative nausea and vomiting)    nausea only  . Sleep apnea   . Vulvovaginitis    CHRONIC VULVAR ITCH TX C TENNOVATE CREAM C SOME  RELIEF    Surgical History: Past Surgical History:  Procedure Laterality Date  . ABLATION  2013   [facility]  . BACK SURGERY    . BACK SURGERY  2016; 2017   L4, L5  . CARDIAC SURGERY  03/19/12; 10/2014   HEART CATH AND STENT  . [place]; 1989  . COLD KNIFE CONE BIOPSY  1988  . COLONOSCOPY  08/2010   16 POLYPS (ALL BENIGN) DX C CELIAC DISEASE  . COMBINED HYSTEROSCOPY DIAGNOSTIC / D&C  2013   CCK  . CYSTOSCOPY W/ RETROGRADES Left 01/20/2019   Procedure: CYSTOSCOPY WITH RETROGRADE PYELOGRAM;  Surgeon: Abbie Sons, MD;  Location: [facility];  Service: Urology;  Laterality: Left;  . CYSTOSCOPY W/ URETERAL STENT PLACEMENT Left 12/13/2018   Procedure: CYSTOSCOPY WITH RETROGRADE PYELOGRAM/URETERAL STENT PLACEMENT;  Surgeon: Irine Seal, MD;  Location: [facility];  Service: Urology;  Laterality: Left;  . CYSTOSCOPY/URETEROSCOPY/HOLMIUM LASER/STENT PLACEMENT Left 01/20/2019   Procedure: CYSTOSCOPY/URETEROSCOPY/HOLMIUM LASER/STENT EXCHANGE;  Surgeon: Abbie Sons, MD;  Location: [facility];  Service: Urology;  Laterality: Left;  . DILATION AND CURETTAGE OF UTERUS  2001  . ESOPHAGOGASTRODUODENOSCOPY  08/2010   DX C CELIAC DISEASE  . HEART STENTS  05/27/2011  . HIP SURGERY  2016   RIGHT IT BAND AND BURSA REMOVAL  . OOPHORECTOMY Right 1982    Home Medications:  Allergies as of 02/10/2019  Reactions   Other    Gluten allergy   Codeine Nausea And Vomiting, Rash   Nexium [esomeprazole Magnesium] Palpitations   Omeprazole Magnesium Palpitations   Oxycodone-acetaminophen Itching, Nausea And Vomiting, Rash   Percocet [oxycodone-acetaminophen] Itching, Nausea And Vomiting, Rash   Tramadol Rash      Medication List       Accurate as of February 10, 2019 12:41 PM. If you have any questions, ask your nurse or doctor.        amLODipine 5 MG tablet Commonly known as: NORVASC Take 1 tablet (5 mg total) by mouth daily. What changed: when to take this   azelastine 0.1 % nasal  spray Commonly known as: ASTELIN Place 2 sprays into both nostrils 2 (two) times daily.   Brilinta 60 MG Tabs tablet Generic drug: ticagrelor Take 60 mg by mouth 2 (two) times daily.   budesonide 0.5 MG/2ML nebulizer solution Commonly known as: PULMICORT Take 2 mLs (0.5 mg total) by nebulization 2 (two) times daily.   cetirizine 10 MG tablet Commonly known as: ZYRTEC Take 1 tablet (10 mg total) by mouth at bedtime.   chlorpheniramine-HYDROcodone 10-8 MG/5ML Suer Commonly known as: TUSSIONEX Take 5 mLs by mouth as needed.   Crestor 10 MG tablet Generic drug: rosuvastatin Take 10 mg by mouth at bedtime.   [facility] Aspirin Adult Low Dose 81 MG chewable tablet Generic drug: aspirin Chew 81 mg by mouth daily.   famotidine 40 MG tablet Commonly known as: PEPCID Take 40 mg by mouth at bedtime.   HYDROcodone-acetaminophen 5-325 MG tablet Commonly known as: NORCO/VICODIN Take 1 tablet by mouth every 6 (six) hours as needed for moderate pain.   LORazepam 1 MG tablet Commonly known as: ATIVAN Take 1 mg by mouth 2 (two) times daily.   mometasone 50 MCG/ACT nasal spray Commonly known as: NASONEX PLACE 2 SPRAYS INTO THE NOSE DAILY.   montelukast 10 MG tablet Commonly known as: SINGULAIR Take 10 mg by mouth at bedtime.   multivitamin with minerals Tabs tablet Take 1 tablet by mouth daily.   Nitrostat 0.4 MG SL tablet Generic drug: nitroGLYCERIN Place 0.4 mg under the tongue every 5 (five) minutes as needed for chest pain.   oxybutynin 5 MG tablet Commonly known as: DITROPAN Take 1 tablet (5 mg total) by mouth 3 (three) times daily.   pantoprazole 40 MG tablet Commonly known as: PROTONIX Take 40 mg by mouth every morning.   ProAir HFA 108 (90 Base) MCG/ACT inhaler Generic drug: albuterol Inhale 2 puffs into the lungs every 6 (six) hours as needed.   albuterol (2.5 MG/3ML) 0.083% nebulizer solution Commonly known as: PROVENTIL Take 3 mLs (2.5 mg total) by nebulization  every 6 (six) hours as needed for wheezing or shortness of breath.   propranolol 10 MG tablet Commonly known as: INDERAL Take by mouth.   Symbicort 160-4.5 MCG/ACT inhaler Generic drug: budesonide-formoterol TAKE 2 PUFFS BY MOUTH TWICE A DAY   Tiotropium Bromide Monohydrate 1.25 MCG/ACT Aers Commonly known as: Spiriva Respimat Inhale 2 puffs into the lungs daily.       Allergies:  Allergies  Allergen Reactions  . Other     Gluten allergy  . Codeine Nausea And Vomiting and Rash  . Nexium [Esomeprazole Magnesium] Palpitations  . Omeprazole Magnesium Palpitations  . Oxycodone-Acetaminophen Itching, Nausea And Vomiting and Rash  . Percocet [Oxycodone-Acetaminophen] Itching, Nausea And Vomiting and Rash  . Tramadol Rash    Family History: Family History  Problem Relation Age of  Onset  . CAD Father   . Transient ischemic attack Father   . Diabetes Father   . Cerebrovascular Accident Father   . Heart disease Father        M-MVP; F BETA;BLOCKERS  . Hypothyroidism Father   . Hyperthyroidism Mother   . Cancer Maternal Uncle        KIDNEY  . Uterine cancer Maternal Aunt 69    Social History:   reports that she has never smoked. She has never used smokeless tobacco. She reports that she does not drink alcohol or use drugs.  ROS: UROLOGY Frequent Urination?: No Hard to postpone urination?: No Burning/pain with urination?: Yes Get up at night to urinate?: Yes Leakage of urine?: No Urine stream starts and stops?: No Trouble starting stream?: No Do you have to strain to urinate?: No Blood in urine?: No Urinary tract infection?: No Sexually transmitted disease?: No Injury to kidneys or bladder?: No Painful intercourse?: No Weak stream?: No Currently pregnant?: No Vaginal bleeding?: No Last menstrual period?: n  Gastrointestinal Nausea?: No Vomiting?: No Indigestion/heartburn?: No Diarrhea?: No Constipation?: No  Constitutional Fever: No Night sweats?:  No Weight loss?: No Fatigue?: Yes  Skin Skin rash/lesions?: No Itching?: No  Eyes Blurred vision?: No Double vision?: No  Ears/Nose/Throat Sore throat?: No Sinus problems?: No  Hematologic/Lymphatic Swollen glands?: No Easy bruising?: No  Cardiovascular Leg swelling?: No Chest pain?: No  Respiratory Cough?: Yes Shortness of breath?: Yes  Endocrine Excessive thirst?: No  Musculoskeletal Back pain?: Yes Joint pain?: No  Neurological Headaches?: No Dizziness?: No  Psychologic Depression?: No Anxiety?: No  Physical Exam: BP 106/73   Pulse 91   Ht 5' 5"  (1.651 m)   Wt 215 lb (97.5 kg)   BMI 35.78 kg/m   Constitutional:  Alert and oriented, no acute distress, nontoxic appearing HEENT: [place], AT Cardiovascular: No clubbing, cyanosis, or edema Respiratory: Normal respiratory effort, no increased work of breathing Skin: No rashes, bruises or suspicious lesions GU: No CVA tenderness Neurologic: Grossly intact, no focal deficits, moving all 4 extremities Psychiatric: Normal mood and affect  Laboratory Data: Results for orders placed or performed in visit on 02/10/19  CULTURE, URINE COMPREHENSIVE   Specimen: Urine   UR  Result Value Ref Range   Urine Culture, Comprehensive Preliminary report    Organism ID, Bacteria Comment   Microscopic Examination   URINE  Result Value Ref Range   WBC, UA >30 (A) 0 - 5 /hpf   RBC None seen 0 - 2 /hpf   Epithelial Cells (non renal) 0-10 0 - 10 /hpf   Renal Epithel, UA 0-10 (A) None seen /hpf   Bacteria, UA Few None seen/Few  Urinalysis, Complete  Result Value Ref Range   Specific Gravity, UA 1.025 1.005 - 1.030   pH, UA 5.0 5.0 - 7.5   Color, UA Yellow Yellow   Appearance Ur Cloudy (A) Clear   Leukocytes,UA 2+ (A) Negative   Protein,UA 1+ (A) Negative/Trace   Glucose, UA Negative Negative   Ketones, UA Negative Negative   RBC, UA 1+ (A) Negative   Bilirubin, UA Negative Negative   Urobilinogen, Ur 0.2 0.2 - 1.0  mg/dL   Nitrite, UA Negative Negative   Microscopic Examination See below:    Assessment & Plan:   1. Flank pain with history of urolithiasis 59 year old female with recent history of a 15 mm left UPJ stone associated with sepsis and urinary obstruction s/p ureteroscopy with laser lithotripsy, stent since removed, presents with  persistent left sided soreness with occasional sharp left flank pain.  Well-appearing on physical exam, VSS.  UA significant for pyuria, nonspecific.  Will send for culture to rule out urinary infection and defer antibiotics pending these results.  I do suspect an element of postoperative soreness as well as possible ureteral spasm contributing to this picture.  I counseled her to continue oxybutynin pending her urine culture results as above.  In the interim, I have ordered a renal ultrasound to rule out persistent left-sided hydronephrosis versus stone episode, particularly given previously seen nonobstructing bilateral renal stones.  I counseled her to contact our clinic or proceed to the emergency department immediately if she develops fever, chills, nausea, vomiting, or uncontrollable pain.  She expressed understanding. - Urinalysis, Complete - CULTURE, URINE COMPREHENSIVE - US RENAL   Return if symptoms worsen or fail to improve.  Debroah Loop, PA-C  [facility] [address] [place], [place] [address] [phone]

## 2019-02-12 ENCOUNTER — Other Ambulatory Visit: Payer: Self-pay | Admitting: Internal Medicine

## 2019-02-12 LAB — URINALYSIS, COMPLETE
Bilirubin, UA: NEGATIVE
Glucose, UA: NEGATIVE
Ketones, UA: NEGATIVE
Nitrite, UA: NEGATIVE
Specific Gravity, UA: 1.025 (ref 1.005–1.030)
Urobilinogen, Ur: 0.2 mg/dL (ref 0.2–1.0)
pH, UA: 5 (ref 5.0–7.5)

## 2019-02-12 LAB — MICROSCOPIC EXAMINATION
RBC, Urine: NONE SEEN /hpf (ref 0–2)
WBC, UA: >30 /hpf — AB (ref 0–5)

## 2019-02-13 NOTE — Telephone Encounter (Signed)
Entered in error

## 2019-02-14 LAB — CULTURE, URINE COMPREHENSIVE

## 2019-02-16 ENCOUNTER — Telehealth: Payer: Self-pay | Admitting: Physician Assistant

## 2019-02-16 MED ORDER — CEPHALEXIN 500 MG PO CAPS: 500.0000 mg | ORAL_CAPSULE | Freq: Two times a day (BID) | ORAL | 0 refills | Status: AC

## 2019-02-16 NOTE — Telephone Encounter (Signed)
Called pt no answer. Mychart sent.

## 2019-02-16 NOTE — Telephone Encounter (Signed)
Please contact the patient and inform her that her urine culture did come back positive for infection.  I have sent a prescription for Keflex 500 mg twice [facility]7 [facility] [place] [facility] [address].  Please counsel her to start this medication as soon as possible.  If her flank pain resolves on this medication, she does not need to follow-up with renal ultrasound as previously discussed.

## 2019-02-17 ENCOUNTER — Other Ambulatory Visit: Payer: Self-pay

## 2019-02-17 DIAGNOSIS — B379 Candidiasis, unspecified: Secondary | ICD-10-CM

## 2019-02-17 MED ORDER — FLUCONAZOLE 150 MG PO TABS: 150.0000 mg | ORAL_TABLET | Freq: Once | ORAL | 0 refills | Status: AC

## 2019-02-23 ENCOUNTER — Ambulatory Visit
Admission: RE | Admit: 2019-02-23 | Discharge: 2019-02-23 | Disposition: A | Discharge status: Home / Self Care | Payer: BC Managed Care – PPO | Source: Ambulatory Visit | Attending: Internal Medicine | Admitting: Internal Medicine

## 2019-02-23 ENCOUNTER — Other Ambulatory Visit: Payer: Self-pay | Admitting: Internal Medicine

## 2019-02-23 ENCOUNTER — Other Ambulatory Visit: Payer: Self-pay

## 2019-02-23 DIAGNOSIS — R05 Cough: Secondary | ICD-10-CM | POA: Diagnosis present

## 2019-02-23 DIAGNOSIS — R059 Cough, unspecified: Secondary | ICD-10-CM

## 2019-02-27 ENCOUNTER — Ambulatory Visit
Admission: RE | Admit: 2019-02-27 | Discharge: 2019-02-27 | Disposition: A | Discharge status: Home / Self Care | Payer: BC Managed Care – PPO | Source: Ambulatory Visit | Attending: Physician Assistant | Admitting: Physician Assistant

## 2019-02-27 ENCOUNTER — Other Ambulatory Visit: Payer: Self-pay

## 2019-02-27 ENCOUNTER — Other Ambulatory Visit: Payer: Self-pay | Admitting: Urology

## 2019-02-27 DIAGNOSIS — Z87442 Personal history of urinary calculi: Secondary | ICD-10-CM | POA: Insufficient documentation

## 2019-02-27 DIAGNOSIS — R109 Unspecified abdominal pain: Secondary | ICD-10-CM | POA: Insufficient documentation

## 2019-03-05 ENCOUNTER — Other Ambulatory Visit: Payer: Self-pay

## 2019-03-05 ENCOUNTER — Encounter: Payer: Self-pay | Admitting: Urology

## 2019-03-05 ENCOUNTER — Ambulatory Visit: Payer: BC Managed Care – PPO | Admitting: Urology

## 2019-03-05 VITALS — BP 126/85 | HR 103 | Ht 65.0 in | Wt 212.0 lb

## 2019-03-05 DIAGNOSIS — N200 Calculus of kidney: Secondary | ICD-10-CM | POA: Diagnosis not present

## 2019-03-10 ENCOUNTER — Other Ambulatory Visit: Payer: Self-pay | Admitting: Internal Medicine

## 2019-03-10 NOTE — Progress Notes (Signed)
03/05/2019 4:56 PM   Brenda Craig 05/29/1960 [id number]  Referring provider: Albina Billet, MD [address]   [place],  [place] [address]  Chief Complaint  Patient presents with  . Follow-up    HPI: Brenda Craig is a 59 year old female with a nephrolithiasis who presents today to discuss her [facility]ol[facility] results.  She is status post left URS/LL/ureteral stent exchange on 01/20/2019 with Dr. Bernardo Heater and subsequent cystoscopy stent removal on 01/29/2019 with Dr. Bernardo Heater for a 15 mm left UPJ stone.    Stone composition was 70% CaOx monohydrate, 20% CaOx dihydrate and 10% hydroxyapatite.   Litholink report completed 02/09/2019 revealed low urine volume, hypomagnesiuria, mild CaOx stone risk and moderate CaP stone risk.    She states that she has been drinking copious amount of water, so she does not understand the low urine volume result.  PMH: Past Medical History:  Diagnosis Date  . Abnormal Pap smear of cervix 1988  . Acid reflux 01/17/2013  . Allergic rhinitis   . Anemia   . Anxiety   . Asthma   . Celiac disease   . Cervical dysplasia 1988  . CHF (congestive heart failure) ([place])   . Complication of anesthesia    bp drops after anesthesia and has to be kept overnight  . Coronary artery disease   . Heart trouble   . History of cardiac arrest   . History of mammogram 03/25/2013; 12/07/14   BIRADS 1; NEG  . History of Papanicolaou smear of cervix 03/20/12; 12/28/15   -/-; -/-  . Hypercholesteremia   . Hypertension   . Menopausal symptoms   . MI (myocardial infarction) ([place])   . Mitral valve prolapse   . PONV (postoperative nausea and vomiting)    nausea only  . Sleep apnea   . Vulvovaginitis    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    Surgical History: Past Surgical History:  Procedure Laterality Date  . ABLATION  2013   [facility]K  . BACK SURGERY    . BACK SURGERY  2016; 2017   L4, L5  . CARDIAC SURGERY  03/19/12; 10/2014   HEART  CATH AND STENT  . [place]; 1989  . COLD KNIFE CONE BIOPSY  1988  . COLONOSCOPY  08/2010   16 POLYPS (ALL BENIGN) DX C CELIAC DISEASE  . COMBINED HYSTEROSCOPY DIAGNOSTIC / D&C  2013   CCK  . CYSTOSCOPY W/ RETROGRADES Left 01/20/2019   Procedure: CYSTOSCOPY WITH RETROGRADE PYELOGRAM;  Surgeon: Abbie Sons, MD;  Location: [facility];  Service: Urology;  Laterality: Left;  . CYSTOSCOPY W/ URETERAL STENT PLACEMENT Left 12/13/2018   Procedure: CYSTOSCOPY WITH RETROGRADE PYELOGRAM/URETERAL STENT PLACEMENT;  Surgeon: Irine Seal, MD;  Location: [facility];  Service: Urology;  Laterality: Left;  . CYSTOSCOPY/URETEROSCOPY/HOLMIUM LASER/STENT PLACEMENT Left 01/20/2019   Procedure: CYSTOSCOPY/URETEROSCOPY/HOLMIUM LASER/STENT EXCHANGE;  Surgeon: Abbie Sons, MD;  Location: [facility];  Service: Urology;  Laterality: Left;  . DILATION AND CURETTAGE OF UTERUS  2001  . ESOPHAGOGASTRODUODENOSCOPY  08/2010   DX C CELIAC DISEASE  . HEART STENTS  05/27/2011  . HIP SURGERY  2016   RIGHT IT BAND AND BURSA REMOVAL  . OOPHORECTOMY Right 1982    Home Medications:  Allergies as of 03/05/2019      Reactions   Other    Gluten allergy   Codeine Nausea And Vomiting, Rash   Nexium [esomeprazole Magnesium] Palpitations  Omeprazole Magnesium Palpitations   Oxycodone-acetaminophen Itching, Nausea And Vomiting, Rash   Percocet [oxycodone-acetaminophen] Itching, Nausea And Vomiting, Rash   Tramadol Rash      Medication List       Accurate as of March 05, 2019 11:59 PM. If you have any questions, ask your nurse or doctor.        STOP taking these medications   azelastine 0.1 % nasal spray Commonly known as: ASTELIN Stopped by: Zara Council, PA-C   HYDROcodone-acetaminophen 5-325 MG tablet Commonly known as: NORCO/VICODIN Stopped by: Libbey Duce, PA-C   mometasone 50 MCG/ACT nasal spray Commonly known as: NASONEX Stopped by: Jessaca Philippi, PA-C   multivitamin with  minerals Tabs tablet Stopped by: Anuj Summons, PA-C   oxybutynin 5 MG tablet Commonly known as: DITROPAN Stopped by: Julee Stoll, PA-C   Tiotropium Bromide Monohydrate 1.25 MCG/ACT Aers Commonly known as: Spiriva Respimat Stopped by: Zara Council, PA-C     TAKE these medications   amLODipine 5 MG tablet Commonly known as: NORVASC Take 1 tablet (5 mg total) by mouth daily. What changed: when to take this   Brilinta 60 MG Tabs tablet Generic drug: ticagrelor Take 60 mg by mouth 2 (two) times daily.   budesonide 0.5 MG/2ML nebulizer solution Commonly known as: PULMICORT Take 2 mLs (0.5 mg total) by nebulization 2 (two) times daily.   chlorpheniramine-HYDROcodone 10-8 MG/5ML Suer Commonly known as: TUSSIONEX Take 5 mLs by mouth as needed.   Crestor 10 MG tablet Generic drug: rosuvastatin Take 10 mg by mouth at bedtime.   [facility]S Aspirin Adult Low Dose 81 MG chewable tablet Generic drug: aspirin Chew 81 mg by mouth daily.   CVS Indoor/Outdoor Allergy Rlf 10 MG tablet Generic drug: cetirizine TAKE 1 TABLET BY MOUTH EVERYDAY AT BEDTIME   famotidine 40 MG tablet Commonly known as: PEPCID Take 40 mg by mouth at bedtime.   LORazepam 1 MG tablet Commonly known as: ATIVAN Take 1 mg by mouth 2 (two) times daily.   montelukast 10 MG tablet Commonly known as: SINGULAIR Take 10 mg by mouth at bedtime.   Nitrostat 0.4 MG SL tablet Generic drug: nitroGLYCERIN Place 0.4 mg under the tongue every 5 (five) minutes as needed for chest pain.   pantoprazole 40 MG tablet Commonly known as: PROTONIX Take 40 mg by mouth every morning.   ProAir HFA 108 (90 Base) MCG/ACT inhaler Generic drug: albuterol Inhale 2 puffs into the lungs every 6 (six) hours as needed.   albuterol (2.5 MG/3ML) 0.083% nebulizer solution Commonly known as: PROVENTIL Take 3 mLs (2.5 mg total) by nebulization every 6 (six) hours as needed for wheezing or shortness of breath.   Symbicort 160-4.5  MCG/ACT inhaler Generic drug: budesonide-formoterol TAKE 2 PUFFS BY MOUTH TWICE A DAY       Allergies:  Allergies  Allergen Reactions  . Other     Gluten allergy  . Codeine Nausea And Vomiting and Rash  . Nexium [Esomeprazole Magnesium] Palpitations  . Omeprazole Magnesium Palpitations  . Oxycodone-Acetaminophen Itching, Nausea And Vomiting and Rash  . Percocet [Oxycodone-Acetaminophen] Itching, Nausea And Vomiting and Rash  . Tramadol Rash    Family History: Family History  Problem Relation Age of Onset  . CAD Father   . Transient ischemic attack Father   . Diabetes Father   . Cerebrovascular Accident Father   . Heart disease Father        M-MVP; F BETA;BLOCKERS  . Hypothyroidism Father   . Hyperthyroidism Mother   .  Cancer Maternal Uncle        KIDNEY  . Uterine cancer Maternal Aunt 69    Social History:  reports that she has never smoked. She has never used smokeless tobacco. She reports that she does not drink alcohol or use drugs.  ROS: For pertinent review of systems please refer to history of present illness  Physical Exam: BP 126/85   Pulse (!) 103   Ht 5' 5"  (1.651 m)   Wt 212 lb (96.2 kg)   BMI 35.28 kg/m   Constitutional:  Well nourished. Alert and oriented, No acute distress. HEENT: [place] AT, mask in place.  Trachea midline, no masses. Cardiovascular: No clubbing, cyanosis, or edema. Respiratory: Normal respiratory effort, no increased work of breathing. Neurologic: Grossly intact, no focal deficits, moving all 4 extremities. Psychiatric: Normal mood and affect.  Laboratory Data: Lab Results  Component Value Date   WBC 9.8 12/17/2018   HGB 11.2 (L) 12/17/2018   HCT 33.9 (L) 12/17/2018   MCV 83.9 12/17/2018   PLT 164 12/17/2018    Lab Results  Component Value Date   CREATININE 0.79 12/18/2018    Lab Results  Component Value Date   TSH 2.755 12/21/2015       Component Value Date/Time   CHOL 106 08/22/2015 0940   HDL 42 08/22/2015  0940   CHOLHDL 2.5 08/22/2015 0940   VLDL 21 08/22/2015 0940   LDLCALC 43 08/22/2015 0940    Lab Results  Component Value Date   AST 41 12/15/2018   Lab Results  Component Value Date   ALT 29 12/15/2018    Urinalysis    Component Value Date/Time   COLORURINE YELLOW (A) 12/13/2018 1933   APPEARANCEUR Cloudy (A) 02/10/2019 0923   LABSPEC 1.011 12/13/2018 1933   PHURINE 5.0 12/13/2018 1933   GLUCOSEU Negative 02/10/2019 0923   HGBUR LARGE (A) 12/13/2018 1933   BILIRUBINUR Negative 02/10/2019 0923   KETONESUR 5 (A) 12/13/2018 1933   PROTEINUR 1+ (A) 02/10/2019 0923   PROTEINUR 100 (A) 12/13/2018 1933   NITRITE Negative 02/10/2019 0923   NITRITE NEGATIVE 12/13/2018 1933   LEUKOCYTESUR 2+ (A) 02/10/2019 0923   LEUKOCYTESUR TRACE (A) 12/13/2018 1933  I have reviewed the labs.   Pertinent Imaging: No recent  Assessment & Plan:    1. Nephrolithiasis 24 hour metabolic workup revealed low urine volume.  I explained that she needs to drink 2.5L of water daily, so she will measure how much water she is drinking at home to ensure she is getting adequate hydration.  The study also noted hypomagnesiuria, but she has a history of CHF and taking magnesium to supplement may be contraindicated.  She will contact her cardiologist regarding taking the magnesium.  We will also refer to a nutritionist that specializes in celiac disease to help manage fluid and dietary issues that would be safe for her CHF and keeping in mind the celiac disease and her desire to prevent more stones.    Return for refer to nutrionist .  These notes generated with voice recognition software. I apologize for typographical errors.  Zara Council, PA-C  [facility] [address]  [place], [place] [address] [phone]  A total of 20 minutes were spent face-to-face with the patient, greater than 50% was spent in patient education, counseling, and coordination of care  regarding the stone analysis and her concerns regarding a referral to a nutritionist that has a good understanding of Celiac disease.

## 2019-04-01 ENCOUNTER — Other Ambulatory Visit: Payer: Self-pay | Admitting: Internal Medicine

## 2019-04-06 ENCOUNTER — Other Ambulatory Visit: Payer: Self-pay

## 2019-04-06 ENCOUNTER — Encounter: Payer: BC Managed Care – PPO | Attending: Urology | Admitting: Dietician

## 2019-04-06 DIAGNOSIS — K900 Celiac disease: Secondary | ICD-10-CM | POA: Diagnosis not present

## 2019-04-06 DIAGNOSIS — N200 Calculus of kidney: Secondary | ICD-10-CM | POA: Diagnosis not present

## 2019-04-06 NOTE — Patient Instructions (Signed)
   Continue with current eating pattern, great job working on portion control, healthy food choices, and exercise! Keep up the good work!  Include some GF carb and protein with all meals. Fill the rest of your plate with low-carb veggies and fruits.   Include some sources of magnesium regularly to help prevent low urine magnesium

## 2019-04-06 NOTE — Progress Notes (Signed)
Medical Nutrition Therapy: Visit start time: 1430  end time: 1530  Assessment:  Diagnosis: celiac disease kidney stones Past medical history: HTN, CHF, HLD, sleep apnea, GERD, asthma Psychosocial issues/ stress concerns: none  Preferred learning method:  . Auditory . Visual   Current weight: 218.7lbs Height: 5'5" Medications, supplements: reconciled list in medical record  Progress and evaluation:   Patient reports gradual weight gain over several years time.   She is following gluten free diet, but has gained weight.   She has dealt with chronic cough that has required prednisone treatment several times that has also led to weight gain.   She is working on weight loss, participating in [employer] program  Physical activity: low impact walking, video 20-30 minutes 3-5 times a week  Dietary Intake:  Usual eating pattern includes 3 meals and 0-1 snacks per day. Dining out frequency: 0-1 meals per week.  Breakfast: gf bread bagel or eng muffin; occ cereal + banana or orange Snack: none Lunch: often salads (lettuce, GF croutons, boar's head meat 2-3oz [place], chicken or beef); leftovers; [employer] Ceasar salad Snack: none Supper: stir-fry dhicken peppers, onion, veg blend or rice; pork chop/ chicken/ spaghetti- pasta gf; gf pancakes Snack: 7-9pm occasionally -- fruit/ "skinny" popcorn/ tries to avoid sweets but occ craves ice cream Beverages: water, 16oz sprite zero ( to take meds)  Nutrition Care Education: Topics covered:  Basic nutrition: basic food groups, appropriate nutrient balance, appropriate meal and snack schedule, general nutrition guidelines    Weight control: importance of low sugar and low fat choices, portion control  Celiac disease: reviewed basic gluten free diet, avoiding trace amounts of gluten and cross contamination; gluten free grains and starches Kidney stones: high oxalate foods, importance of adequate calcium intake but avoidance of supplements/  excess intake; increasing magnesium due to low magnesium in recent urinalysis.   Nutritional Diagnosis:  [place]-2.2 Altered nutrition-related laboratory As related to recent kidney stone.  As evidenced by stone composition and urinalysis. [place]-1.4 Altered GI function As related to celiac disease.  As evidenced by GI pain and diarrhea after consuming gluten-containing foods. [place]-3.3 Overweight/obesity As related to excess calories and inadequate physical activity.  As evidenced by patient with current BMI of 36, following Weight Watchers diet plan for weight loss.  Intervention:   Instruction and discussion as noted above.  Patient is following gluten free diet closely, and has been making recent changes to control calorie intake and lose weight.  Established goals to further improve nutritional balance and reduce risk for kidney stones.  No follow-up scheduled at this time; patient will schedule later if needed.  Education Materials given:  . Plate Planner with food lists  . Gluten Free Guide for Families . Kidney Stones Nutrition Therapy (NCM) . High Magnesium Foods list (NCM) . Goals/ instructions   Learner/ who was taught:  . Patient   Level of understanding: Marland Kitchen Verbalizes/ demonstrates competency  Demonstrated degree of understanding via:   Teach back Learning barriers: . None  Willingness to learn/ readiness for change: . Eager, change in progress   Monitoring and Evaluation:  Dietary intake, exercise, GI symptoms and renal health, and body weight      follow up: prn

## 2019-04-09 ENCOUNTER — Other Ambulatory Visit: Payer: Self-pay | Admitting: Internal Medicine

## 2019-04-20 ENCOUNTER — Telehealth: Payer: Self-pay | Admitting: Urology

## 2019-04-20 ENCOUNTER — Other Ambulatory Visit: Payer: Self-pay | Admitting: Family Medicine

## 2019-04-20 DIAGNOSIS — N200 Calculus of kidney: Secondary | ICD-10-CM

## 2019-04-20 NOTE — Telephone Encounter (Signed)
Please let Brenda Craig know that we have received the notes from her nutritionist.  I would like to see her back in 6 months for a KUB and office visit.

## 2019-04-20 NOTE — Telephone Encounter (Signed)
LMOM for patient to return call to schedule appointment to see Larene Beach in 6 months. KUB order is in.

## 2019-04-21 NOTE — Telephone Encounter (Signed)
LMOM for patient to return call and schedule appointment.

## 2019-04-22 NOTE — Telephone Encounter (Signed)
LMOM for patient to return call to schedule appointment.

## 2019-04-27 ENCOUNTER — Encounter: Payer: Self-pay | Admitting: Family Medicine

## 2019-04-27 NOTE — Telephone Encounter (Signed)
Unable to reach patient. A Mychart message was sent to patient.

## 2019-08-02 ENCOUNTER — Other Ambulatory Visit: Payer: Self-pay | Admitting: Internal Medicine

## 2019-08-11 ENCOUNTER — Other Ambulatory Visit: Payer: Self-pay | Admitting: Internal Medicine

## 2019-10-28 ENCOUNTER — Ambulatory Visit: Payer: BC Managed Care – PPO | Admitting: Urology

## 2019-11-04 ENCOUNTER — Other Ambulatory Visit: Payer: Self-pay | Admitting: Obstetrics and Gynecology

## 2019-11-04 DIAGNOSIS — L309 Dermatitis, unspecified: Secondary | ICD-10-CM

## 2019-11-07 NOTE — Progress Notes (Signed)
11/09/2019 1:41 PM   [name] 1960-08-09 [id number]  Referring provider: Albina Billet, MD [address]   [place],  [place] [address]  Chief Complaint  Patient presents with  . Nephrolithiasis    32mow/KUB    HPI: Brenda Craig a 59year old female with a nephrolithiasis who presents today for follow up.    She is status post left URS/LL/ureteral stent exchange on 01/20/2019 with Dr. SBernardo Heaterand subsequent cystoscopy stent removal on 01/29/2019 with Dr. SBernardo Heaterfor a 15 mm left UPJ stone.    Stone composition was 70% CaOx monohydrate, 20% CaOx dihydrate and 10% hydroxyapatite.   [facility] report completed 02/09/2019 revealed low urine volume, hypomagnesiuria, mild CaOx stone risk and moderate CaP stone risk.    KUB today did not demonstrate any ureteral or renal calculus.  Calcification on the right likely parenchymal calcification.    Patient denies any modifying or aggravating factors.  Patient denies any gross hematuria, dysuria or suprapubic/flank pain.  Patient denies any fevers, chills, nausea or vomiting.   PMH: Past Medical History:  Diagnosis Date  . Abnormal Pap smear of cervix 1988  . Acid reflux 01/17/2013  . Allergic rhinitis   . Anemia   . Anxiety   . Asthma   . Celiac disease   . Cervical dysplasia 1988  . [place]   . Complication of anesthesia    bp drops after anesthesia and has to be kept overnight  . Coronary artery disease   . Heart trouble   . History of cardiac arrest   . History of mammogram 03/25/2013; 12/07/14   BIRADS 1; NEG  . History of Papanicolaou smear of cervix 03/20/12; 12/28/15   -/-; -/-  . Hypercholesteremia   . Hypertension   . Menopausal symptoms   . [place] infarction) [place]   . Mitral valve prolapse   . PONV (postoperative nausea and vomiting)    nausea only  . Sleep apnea   . Vulvovaginitis    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     Surgical History: Past Surgical History:  Procedure Laterality Date  . ABLATION  2013   [facility]  . BACK SURGERY    . BACK SURGERY  2016; 2017   L4, L5  . CARDIAC SURGERY  03/19/12; 10/2014   [place] ST[place]  [place]  . COLD KNIFE CONE BIOPSY  1988  . COLONOSCOPY  08/2010   16 POLYPS (ALL BENIGN) DX C CELIAC DISEASE  . COMBINED HYSTEROSCOPY DIAGNOSTIC / D&C  2013   [facility]  . CYSTOSCOPY W/ RETROGRADES Left 01/20/2019   Procedure: CYSTOSCOPY WITH RETROGRADE PYELOGRAM;  Surgeon: SAbbie Sons MD;  Location: [facility] ORS;  Service: Urology;  Laterality: Left;  . CYSTOSCOPY W/ URETERAL STENT PLACEMENT Left 12/13/2018   Procedure: CYSTOSCOPY WITH RETROGRADE PYELOGRAM/URETERAL STENT PLACEMENT;  Surgeon: WIrine Seal MD;  Location: [facility] ORS;  Service: Urology;  Laterality: Left;  . CYSTOSCOPY/URETEROSCOPY/HOLMIUM LASER/STENT PLACEMENT Left 01/20/2019   Procedure: CYSTOSCOPY/URETEROSCOPY/HOLMIUM LASER/STENT EXCHANGE;  Surgeon: SAbbie Sons MD;  Location: [facility] ORS;  Service: Urology;  Laterality: Left;  . DILATION AND CURETTAGE OF UTERUS  2001  . ESOPHAGOGASTRODUODENOSCOPY  08/2010   DX C CELIAC DISEASE  . HEART STENTS  05/27/2011  . HIP SURGERY  2016   RIGHT IT BAND AND BURSA REMOVAL  . OOPHORECTOMY Right 1982    Home Medications:  Allergies as of 11/09/2019      Reactions   Other    Gluten allergy   Codeine Nausea And Vomiting, Rash   Nexium [esomeprazole Magnesium] Palpitations   Omeprazole Magnesium Palpitations   Oxycodone-acetaminophen Itching, Nausea And Vomiting, Rash   Percocet [oxycodone-acetaminophen] Itching, Nausea And Vomiting, Rash   Tramadol Rash      Medication List       Accurate as of November 09, 2019  1:41 PM. If you have any questions, ask your nurse or doctor.        STOP taking these medications   albuterol (2.5 MG/3ML) 0.083% nebulizer solution Commonly known as: PROVENTIL Stopped by: Micheil Klaus, PA-C    budesonide 0.5 MG/2ML nebulizer solution Commonly known as: PULMICORT Stopped by: Shawndale Kilpatrick, PA-C   cetirizine 10 MG tablet Commonly known as: ZYRTEC Stopped by: Zara Council, PA-C   ProAir HFA 108 (90 Base) MCG/ACT inhaler Generic drug: albuterol Stopped by: Zara Council, PA-C   Symbicort 160-4.5 MCG/ACT inhaler Generic drug: budesonide-formoterol Stopped by: Zara Council, PA-C     TAKE these medications   amLODipine 5 MG tablet Commonly known as: NORVASC Take 1 tablet (5 mg total) by mouth daily. What changed: when to take this   Brilinta 60 MG Tabs tablet Generic drug: ticagrelor Take 60 mg by mouth 2 (two) times daily.   chlorpheniramine-HYDROcodone 10-8 MG/5ML Suer Commonly known as: TUSSIONEX Take 5 mLs by mouth as needed.   clotrimazole-betamethasone cream Commonly known as: LOTRISONE APPLY TO AFFECTED AREA TWICE A DAY AS NEEDED FOR SYMPTOMS FOR UP TO 2 WEEKS   [facility] Aspirin Adult Low Dose 81 MG chewable tablet Generic drug: aspirin Chew 81 mg by mouth daily.   famotidine 40 MG tablet Commonly known as: PEPCID Take 40 mg by mouth at bedtime.   LORazepam 1 MG tablet Commonly known as: ATIVAN Take 1 mg by mouth 2 (two) times daily.   montelukast 10 MG tablet Commonly known as: SINGULAIR Take 10 mg by mouth at bedtime.   Nitrostat 0.4 MG SL tablet Generic drug: nitroGLYCERIN Place 0.4 mg under the tongue every 5 (five) minutes as needed for chest pain.   pantoprazole 40 MG tablet Commonly known as: PROTONIX Take 40 mg by mouth every morning.       Allergies:  Allergies  Allergen Reactions  . Other     Gluten allergy  . Codeine Nausea And Vomiting and Rash  . Nexium [Esomeprazole Magnesium] Palpitations  . Omeprazole Magnesium Palpitations  . Oxycodone-Acetaminophen Itching, Nausea And Vomiting and Rash  . Percocet [Oxycodone-Acetaminophen] Itching, Nausea And Vomiting and Rash  . Tramadol Rash    Family History: Family  History  Problem Relation Age of Onset  . CAD Father   . Transient ischemic attack Father   . Diabetes Father   . Cerebrovascular Accident Father   . Heart disease Father        M-MVP; F BETA;BLOCKERS  . Hypothyroidism Father   . Hyperthyroidism Mother   . Cancer Maternal Uncle        KIDNEY  . Uterine cancer Maternal Aunt 69    Social History:  reports that she has never smoked. She has never used smokeless tobacco. She reports that she does not drink alcohol and does not use drugs.  ROS: For pertinent review of systems please refer to history of present illness  Physical Exam: BP 111/74   Pulse 88   Ht 5' 5"  (1.651 m)   Wt 200 lb (90.7 kg)  BMI 33.28 kg/m   Constitutional:  Well nourished. Alert and oriented, No acute distress. HEENT: [place] AT, mask in place.  Trachea midline Cardiovascular: No clubbing, cyanosis, or edema. Respiratory: Normal respiratory effort, no increased work of breathing. Neurologic: Grossly intact, no focal deficits, moving all 4 extremities. Psychiatric: Normal mood and affect.   Laboratory Data: Lab Results  Component Value Date   WBC 9.8 12/17/2018   HGB 11.2 (L) 12/17/2018   HCT 33.9 (L) 12/17/2018   MCV 83.9 12/17/2018   PLT 164 12/17/2018    Lab Results  Component Value Date   CREATININE 0.79 12/18/2018    Lab Results  Component Value Date   TSH 2.755 12/21/2015       Component Value Date/Time   CHOL 106 08/22/2015 0940   HDL 42 08/22/2015 0940   CHOLHDL 2.5 08/22/2015 0940   VLDL 21 08/22/2015 0940   LDLCALC 43 08/22/2015 0940    Lab Results  Component Value Date   AST 41 12/15/2018   Lab Results  Component Value Date   ALT 29 12/15/2018    Urinalysis    Component Value Date/Time   COLORURINE YELLOW (A) 12/13/2018 1933   APPEARANCEUR Cloudy (A) 02/10/2019 0923   LABSPEC 1.011 12/13/2018 1933   PHURINE 5.0 12/13/2018 1933   GLUCOSEU Negative 02/10/2019 0923   HGBUR LARGE (A) 12/13/2018 1933   BILIRUBINUR  Negative 02/10/2019 0923   KETONESUR 5 (A) 12/13/2018 1933   PROTEINUR 1+ (A) 02/10/2019 0923   PROTEINUR 100 (A) 12/13/2018 1933   NITRITE Negative 02/10/2019 0923   NITRITE NEGATIVE 12/13/2018 1933   LEUKOCYTESUR 2+ (A) 02/10/2019 0923   LEUKOCYTESUR TRACE (A) 12/13/2018 1933  I have reviewed the labs.   Pertinent Imaging: CLINICAL DATA:  Kidney stone with sepsis December 2020, subsequent surgery lithotripsy, follow-up nephrolithiasis  EXAM: ABDOMEN - 1 VIEW  COMPARISON:  CT abdomen and pelvis 12/13/2018  FINDINGS: Probable calculus 11 mm at mid to upper RIGHT kidney.  Additional questionable calculus versus stool artifact projecting over renal pelvis, 9 mm diameter.  No LEFT renal calculi.  No ureteral calcifications.  Bowel gas pattern normal.  Osseous demineralization with degenerative facet disease changes lumbar spine.  IMPRESSION: Potential RIGHT renal calculi as above.   Electronically Signed   By: Lavonia Dana M.D.   On: 11/10/2019 08:15 I have independently reviewed the films.  See HPI.   Assessment & Plan:    1. Nephrolithiasis Has seen dietician and has been following recommendations No episodes of flank pain or gross hematuria RTC in one year for KUB and UA  Return in about 1 year (around 11/08/2020) for KUB and UA .  These notes generated with voice recognition software. I apologize for typographical errors.  Zara Council, PA-C  [facility] [address]  [place], [place] [address] [phone]

## 2019-11-09 ENCOUNTER — Other Ambulatory Visit: Payer: Self-pay

## 2019-11-09 ENCOUNTER — Ambulatory Visit
Admission: RE | Admit: 2019-11-09 | Discharge: 2019-11-09 | Disposition: A | Discharge status: Home / Self Care | Payer: BC Managed Care – PPO | Source: Ambulatory Visit | Attending: Urology | Admitting: Urology

## 2019-11-09 ENCOUNTER — Ambulatory Visit: Payer: BC Managed Care – PPO | Admitting: Urology

## 2019-11-09 ENCOUNTER — Ambulatory Visit
Admission: RE | Admit: 2019-11-09 | Discharge: 2019-11-09 | Disposition: A | Discharge status: Home / Self Care | Payer: BC Managed Care – PPO | Attending: Urology | Admitting: Urology

## 2019-11-09 ENCOUNTER — Encounter: Payer: Self-pay | Admitting: Urology

## 2019-11-09 VITALS — BP 111/74 | HR 88 | Ht 65.0 in | Wt 200.0 lb

## 2019-11-09 DIAGNOSIS — N200 Calculus of kidney: Secondary | ICD-10-CM | POA: Insufficient documentation

## 2019-12-16 NOTE — H&P (Addendum)
KNEE ARTHROPLASTY ADMISSION H&P  Patient ID: NUHA DEGNER MRN: [id number] DOB/AGE: Oct 09, 1960 59 y.o.  Chief Complaint: left knee pain.  Planned Procedure Date: 01/05/2020 Medical Clearance by Dr. Hall Busing   Cardiac Clearance by Dr. Manuella Ghazi    HPI: TIANNI ESCAMILLA is a 59 y.o. female who presents for evaluation of OA LEFT KNEE. The patient has a history of pain and functional disability in the left knee due to arthritis and has failed non-surgical conservative treatments for greater than 12 weeks to include NSAID's and/or analgesics, corticosteriod injections, viscosupplementation injections, use of assistive devices, weight reduction as appropriate and activity modification.  Onset of symptoms was gradual, starting 6 years ago with gradually worsening course since that time. The patient noted no past surgery on the left knee.  Patient currently rates pain at 9 out of 10 with activity. Patient has night pain, worsening of pain with activity and weight bearing, pain that interferes with activities of daily living, pain with passive range of motion and joint swelling.  Patient has evidence of periarticular osteophytes and joint space narrowing by imaging studies.  There is no active infection.  Past Medical History:  Diagnosis Date  . Abnormal Pap smear of cervix 1988  . Acid reflux 01/17/2013  . Allergic rhinitis   . Anemia   . Anxiety   . Asthma   . Celiac disease   . Cervical dysplasia 1988  . CHF (congestive heart failure) ([place])   . Complication of anesthesia    bp drops after anesthesia and has to be kept overnight  . Coronary artery disease   . Heart trouble   . History of cardiac arrest   . History of mammogram 03/25/2013; 12/07/14   BIRADS 1; NEG  . History of Papanicolaou smear of cervix 03/20/12; 12/28/15   -/-; -/-  . Hypercholesteremia   . Hypertension   . Menopausal symptoms   . MI (myocardial infarction) ([place])   . Mitral valve prolapse   . PONV (postoperative  nausea and vomiting)    nausea only  . Sleep apnea   . Vulvovaginitis    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   Past Surgical History:  Procedure Laterality Date  . ABLATION  2013   [facility]  . BACK SURGERY    . BACK SURGERY  2016; 2017   L4, L5  . CARDIAC SURGERY  03/19/12; 10/2014   HEART CATH AND STENT  . [place]; 1989  . COLD KNIFE CONE BIOPSY  1988  . COLONOSCOPY  08/2010   16 POLYPS (ALL BENIGN) DX C CELIAC DISEASE  . COMBINED HYSTEROSCOPY DIAGNOSTIC / D&C  2013   [facility]  . CYSTOSCOPY W/ RETROGRADES Left 01/20/2019   Procedure: CYSTOSCOPY WITH RETROGRADE PYELOGRAM;  Surgeon: Abbie Sons, MD;  Location: [facility] ORS;  Service: Urology;  Laterality: Left;  . CYSTOSCOPY W/ URETERAL STENT PLACEMENT Left 12/13/2018   Procedure: CYSTOSCOPY WITH RETROGRADE PYELOGRAM/URETERAL STENT PLACEMENT;  Surgeon: Irine Seal, MD;  Location: [facility] ORS;  Service: Urology;  Laterality: Left;  . CYSTOSCOPY/URETEROSCOPY/HOLMIUM LASER/STENT PLACEMENT Left 01/20/2019   Procedure: CYSTOSCOPY/URETEROSCOPY/HOLMIUM LASER/STENT EXCHANGE;  Surgeon: Abbie Sons, MD;  Location: [facility] ORS;  Service: Urology;  Laterality: Left;  . DILATION AND CURETTAGE OF UTERUS  2001  . ESOPHAGOGASTRODUODENOSCOPY  08/2010   DX C CELIAC DISEASE  . HEART STENTS  05/27/2011  . HIP SURGERY  2016   RIGHT IT BAND AND BURSA REMOVAL  . OOPHORECTOMY Right  1982   Allergies  Allergen Reactions  . Other     Gluten allergy  . Codeine Nausea And Vomiting and Rash  . Nexium [Esomeprazole Magnesium] Palpitations  . Omeprazole Magnesium Palpitations  . Oxycodone-Acetaminophen Itching, Nausea And Vomiting and Rash  . Percocet [Oxycodone-Acetaminophen] Itching, Nausea And Vomiting and Rash  . Tramadol Rash   Prior to Admission medications   Medication Sig Start Date End Date Taking? Authorizing Provider  amLODipine (NORVASC) 5 MG tablet Take 1 tablet (5 mg total) by mouth daily. Patient taking  differently: Take 5 mg by mouth every morning.  05/23/16   Vaughan Basta, MD  chlorpheniramine-HYDROcodone (TUSSIONEX) 10-8 MG/5ML SUER Take 5 mLs by mouth as needed.  07/02/18   [provider]  clotrimazole-betamethasone (LOTRISONE) cream APPLY TO AFFECTED AREA TWICE A DAY AS NEEDED FOR SYMPTOMS FOR UP TO 2 WEEKS 25/95/63   Copland, Alicia B, PA-C  [facility] ASPIRIN ADULT LOW DOSE 81 MG chewable tablet Chew 81 mg by mouth daily. 05/27/18   [provider]  famotidine (PEPCID) 40 MG tablet Take 40 mg by mouth at bedtime. 12/24/18   [provider]  LORazepam (ATIVAN) 1 MG tablet Take 1 mg by mouth 2 (two) times daily.  11/14/12   [provider]  montelukast (SINGULAIR) 10 MG tablet Take 10 mg by mouth at bedtime.    [provider]  NITROSTAT 0.4 MG SL tablet Place 0.4 mg under the tongue every 5 (five) minutes as needed for chest pain.  11/03/12   [provider]  pantoprazole (PROTONIX) 40 MG tablet Take 40 mg by mouth every morning. 12/24/18   [provider]  ticagrelor (BRILINTA) 60 MG TABS tablet Take 60 mg by mouth 2 (two) times daily.  11/06/12   [provider]   Social History   Socioeconomic History  . Marital status: Divorced    Spouse name: Not on file  . Number of children: 2  . Years of education: 6  . Highest education level: Not on file  Occupational History  . Occupation: TEACHER  Tobacco Use  . Smoking status: Never Smoker  . Smokeless tobacco: Never Used  Vaping Use  . Vaping Use: Never used  Substance and Sexual Activity  . Alcohol use: No  . Drug use: No  . Sexual activity: Not Currently    Birth control/protection: Surgical    Comment: ABLATION  Other Topics Concern  . Not on file  Social History Narrative  . Not on file   Social Determinants of Health   Financial Resource Strain:   . Difficulty of Paying Living Expenses: Not on file  Food Insecurity:   . Worried About Paediatric nurse in the Last Year: Not on file  . Ran Out of Food in the Last Year: Not on file  Transportation Needs:   . Lack of Transportation (Medical): Not on file  . Lack of Transportation (Non-Medical): Not on file  Physical Activity:   . Days of Exercise per Week: Not on file  . Minutes of Exercise per Session: Not on file  Stress:   . Feeling of Stress : Not on file  Social Connections:   . Frequency of Communication with Friends and Family: Not on file  . Frequency of Social Gatherings with Friends and Family: Not on file  . Attends Religious Services: Not on file  . Active Member of Clubs or Organizations: Not on file  . Attends Archivist Meetings: Not on file  .  Marital Status: Not on file   Family History  Problem Relation Age of Onset  . CAD Father   . Transient ischemic attack Father   . Diabetes Father   . Cerebrovascular Accident Father   . Heart disease Father        M-MVP; F BETA;BLOCKERS  . Hypothyroidism Father   . Hyperthyroidism Mother   . Cancer Maternal Uncle        KIDNEY  . Uterine cancer Maternal Aunt 69    ROS: Currently denies lightheadedness, dizziness, Fever, chills, CP, SOB.   History of MI No personal history of DVT, PE, or CVA. No loose teeth or dentures All other systems have been reviewed and were otherwise currently negative with the exception of those mentioned in the HPI and as above.  Objective: Vitals: Ht: 65 Wt: 202 lbs Temp: 97.7 BP: 112/69 Pulse: 90 O2 96% on room air.   Physical Exam: General: AAO x 4, NAD HEENT: EOMI, Trachea Midline, Head AT/[place] Pulm: No increased work of breathing.  Clear B/L A/P w/o crackle or wheeze.  CV: RRR, No m/g/r appreciated  GI: soft, NT, ND Neuro: Neuro without gross focal deficit.  Sensation intact distally Skin: No lesions in the area of chief complaint MSK/Surgical Site: Left knee w/o redness or effusion.   JLT. ROM.  5/5 strength in extension and flexion.  +EHL/FHL.  NVI.  Stable  varus and valgus stress.    Imaging Review Plain radiographs demonstrate severe degenerative joint disease of the left knee.   The overall alignment issignificant varus. The bone quality appears to be good for age and reported activity level.  Preoperative templating of the joint replacement has been completed, documented, and submitted to the Operating Room personnel in order to optimize intra-operative equipment management.  Assessment: OA LEFT KNEE Active Problems:   * No active hospital problems. *   Plan: Plan for Procedure(s): TOTAL KNEE ARTHROPLASTY  The patient history, physical exam, clinical judgement of the provider and imaging are consistent with end stage degenerative joint disease and right joint arthroplasty is deemed medically necessary. The treatment options including medical management, injection therapy, and arthroplasty were discussed at length. The risks and benefits of Procedure(s): TOTAL KNEE ARTHROPLASTY were presented and reviewed.  The risks of nonoperative treatment, versus surgical intervention including but not limited to continued pain, aseptic loosening, stiffness, dislocation/subluxation, infection, bleeding, nerve injury, blood clots, cardiopulmonary complications, morbidity, mortality, among others were discussed. The patient verbalizes understanding and wishes to proceed with the plan.  Patient is being admitted to obervation treatment for surgery, pain control, PT, prophylactic antibiotics, VTE prophylaxis, progressive ambulation, ADL's and discharge planning.   Dental prophylaxis discussed and recommended for 2 years postoperatively.   The patient does meet the criteria for TXA which will be used perioperatively.    ASA 81 mg BID will be used postoperatively for DVT prophylaxis in addition to SCDs, and early ambulation.  Plan for Oxycodone, Tylenol, Celebrex, for pain.   Benadryl for rash reaction to oxycoidone  Robaxin for spasm.  Pantoprazole  for gastric protection. Zofran for PONV  The patient is planning to be discharged home with  HHPT ([facility]) in care of her daughter Lexus. The pt has a nickel allergy Plan for observation status.   Rachael Fee, PA-C 12/16/2019 2:11 PM

## 2019-12-22 NOTE — Patient Instructions (Addendum)
DUE TO COVID-19 ONLY ONE VISITOR IS ALLOWED TO COME WITH YOU AND STAY IN THE WAITING ROOM ONLY DURING PRE OP AND PROCEDURE DAY OF SURGERY. THE 1 VISITOR  MAY VISIT WITH YOU AFTER SURGERY IN YOUR PRIVATE ROOM DURING VISITING HOURS ONLY!  YOU NEED TO HAVE A COVID 19 TEST ON: 12/27  /21 @ 9:30 AM , THIS TEST MUST BE DONE BEFORE SURGERY,  COVID TESTING SITE [place] [id number], IT IS ON THE RIGHT GOING OUT [address] APPROXIMATELY  2 MINUTES PAST [employer] ON THE RIGHT. ONCE YOUR COVID TEST IS COMPLETED,  PLEASE BEGIN THE QUARANTINE INSTRUCTIONS AS OUTLINED IN YOUR HANDOUT.                [name]   Your procedure is scheduled on: 01/05/20   Report to [facility] Main  Entrance   Report to short stay at: 5:30 AM     Call this number if you have problems the morning of surgery [phone]    Remember:   NO SOLID FOOD AFTER MIDNIGHT THE NIGHT PRIOR TO SURGERY. NOTHING BY MOUTH EXCEPT CLEAR LIQUIDS UNTIL: 4:30 AM . PLEASE FINISH ENSURE DRINK PER SURGEON ORDER  WHICH NEEDS TO BE COMPLETED AT: 4:30 AM .  CLEAR LIQUID DIET   Foods Allowed                                                                     Foods Excluded  Coffee and tea, regular and decaf                             liquids that you cannot  Plain Jell-O any favor except red or purple                                           see through such as: Fruit ices (not with fruit pulp)                                     milk, soups, orange juice  Iced Popsicles                                    All solid food Carbonated beverages, regular and diet                                    Cranberry, grape and apple juices Sports drinks like Gatorade Lightly seasoned clear broth or consume(fat free) Sugar, honey syrup  Sample Menu Breakfast                                Lunch  Supper Cranberry juice                    Beef broth                             Chicken broth Jell-O                                     Grape juice                           Apple juice Coffee or tea                        Jell-O                                      Popsicle                                                Coffee or tea                        Coffee or tea  _____________________________________________________________________  BRUSH YOUR TEETH MORNING OF SURGERY AND RINSE YOUR MOUTH OUT, NO CHEWING GUM CANDY OR MINTS.    Take these medicines the morning of surgery with A SIP OF WATER: amlodipine,pantoprazole.Use inhalers and lorazepam as needed.                            You may not have any metal on your body including hair pins and              piercings  Do not wear jewelry, make-up, lotions, powders or perfumes, deodorant             Do not wear nail polish on your fingernails.  Do not shave  48 hours prior to surgery.             Do not bring valuables to the hospital. [place].  Contacts, dentures or bridgework may not be worn into surgery.  Leave suitcase in the car. After surgery it may be brought to your room.     Patients discharged the day of surgery will not be allowed to drive home. IF YOU ARE HAVING SURGERY AND GOING HOME THE SAME DAY, YOU MUST HAVE AN ADULT TO DRIVE YOU HOME AND BE WITH YOU FOR 24 HOURS. YOU MAY GO HOME BY TAXI OR UBER OR ORTHERWISE, BUT AN ADULT MUST ACCOMPANY YOU HOME AND STAY WITH YOU FOR 24 HOURS.  Name and phone number of your driver:  Special Instructions: N/A              Please read over the following fact sheets you were given: _____________________________________________________________________          [facility] - Preparing for Surgery Before surgery, you can play an important role.  Because skin is not sterile, your skin needs  to be as free of germs as possible.  You can reduce the number of germs on your skin by washing with CHG  (chlorahexidine gluconate) soap before surgery.  CHG is an antiseptic cleaner which kills germs and bonds with the skin to continue killing germs even after washing. Please DO NOT use if you have an allergy to CHG or antibacterial soaps.  If your skin becomes reddened/irritated stop using the CHG and inform your nurse when you arrive at Short Stay. Do not shave (including legs and underarms) for at least 48 hours prior to the first CHG shower.  You may shave your face/neck. Please follow these instructions carefully:  1.  Shower with CHG Soap the night before surgery and the  morning of Surgery.  2.  If you choose to wash your hair, wash your hair first as usual with your  normal  shampoo.  3.  After you shampoo, rinse your hair and body thoroughly to remove the  shampoo.                           4.  Use CHG as you would any other liquid soap.  You can apply chg directly  to the skin and wash                       Gently with a scrungie or clean washcloth.  5.  Apply the CHG Soap to your body ONLY FROM THE NECK DOWN.   Do not use on face/ open                           Wound or open sores. Avoid contact with eyes, ears mouth and genitals (private parts).                       Wash face,  Genitals (private parts) with your normal soap.             6.  Wash thoroughly, paying special attention to the area where your surgery  will be performed.  7.  Thoroughly rinse your body with warm water from the neck down.  8.  DO NOT shower/wash with your normal soap after using and rinsing off  the CHG Soap.                9.  Pat yourself dry with a clean towel.            10.  Wear clean pajamas.            11.  Place clean sheets on your bed the night of your first shower and do not  sleep with pets. Day of Surgery : Do not apply any lotions/deodorants the morning of surgery.  Please wear clean clothes to the hospital/surgery center.  FAILURE TO FOLLOW THESE INSTRUCTIONS MAY RESULT IN THE CANCELLATION OF  YOUR SURGERY PATIENT SIGNATURE_________________________________  NURSE SIGNATURE__________________________________  ________________________________________________________________________   Adam Phenix  An incentive spirometer is a tool that can help keep your lungs clear and active. This tool measures how well you are filling your lungs with each breath. Taking long deep breaths may help reverse or decrease the chance of developing breathing (pulmonary) problems (especially infection) following:  A long period of time when you are unable to move or be active. BEFORE THE PROCEDURE   If the spirometer includes an indicator to show  your best effort, your nurse or respiratory therapist will set it to a desired goal.  If possible, sit up straight or lean slightly forward. Try not to slouch.  Hold the incentive spirometer in an upright position. INSTRUCTIONS FOR USE  1. Sit on the edge of your bed if possible, or sit up as far as you can in bed or on a chair. 2. Hold the incentive spirometer in an upright position. 3. Breathe out normally. 4. Place the mouthpiece in your mouth and seal your lips tightly around it. 5. Breathe in slowly and as deeply as possible, raising the piston or the ball toward the top of the column. 6. Hold your breath for 3-5 seconds or for as long as possible. Allow the piston or ball to fall to the bottom of the column. 7. Remove the mouthpiece from your mouth and breathe out normally. 8. Rest for a few seconds and repeat Steps 1 through 7 at least 10 times every 1-2 hours when you are awake. Take your time and take a few normal breaths between deep breaths. 9. The spirometer may include an indicator to show your best effort. Use the indicator as a goal to work toward during each repetition. 10. After each set of 10 deep breaths, practice coughing to be sure your lungs are clear. If you have an incision (the cut made at the time of surgery), support your  incision when coughing by placing a pillow or rolled up towels firmly against it. Once you are able to get out of bed, walk around indoors and cough well. You may stop using the incentive spirometer when instructed by your caregiver.  RISKS AND COMPLICATIONS  Take your time so you do not get dizzy or light-headed.  If you are in pain, you may need to take or ask for pain medication before doing incentive spirometry. It is harder to take a deep breath if you are having pain. AFTER USE  Rest and breathe slowly and easily.  It can be helpful to keep track of a log of your progress. Your caregiver can provide you with a simple table to help with this. If you are using the spirometer at home, follow these instructions: [place] IF:   You are having difficultly using the spirometer.  You have trouble using the spirometer as often as instructed.  Your pain medication is not giving enough relief while using the spirometer.  You develop fever of 100.5 F (38.1 C) or higher. SEEK IMMEDIATE MEDICAL CARE IF:   You cough up bloody sputum that had not been present before.  You develop fever of 102 F (38.9 C) or greater.  You develop worsening pain at or near the incision site. MAKE SURE YOU:   Understand these instructions.  Will watch your condition.  Will get help right away if you are not doing well or get worse. Document Released: 05/07/2006 Document Revised: 03/19/2011 Document Reviewed: 07/08/2006 [facility] Patient Information 2014 [place], [place].   ________________________________________________________________________

## 2019-12-23 ENCOUNTER — Other Ambulatory Visit: Payer: Self-pay

## 2019-12-23 ENCOUNTER — Encounter (HOSPITAL_COMMUNITY)
Admission: RE | Admit: 2019-12-23 | Discharge: 2019-12-23 | Disposition: A | Discharge status: Home / Self Care | Payer: BC Managed Care – PPO | Source: Ambulatory Visit | Attending: Orthopedic Surgery | Admitting: Orthopedic Surgery

## 2019-12-23 ENCOUNTER — Encounter (HOSPITAL_COMMUNITY): Payer: Self-pay

## 2019-12-23 DIAGNOSIS — I2510 Atherosclerotic heart disease of native coronary artery without angina pectoris: Secondary | ICD-10-CM | POA: Insufficient documentation

## 2019-12-23 DIAGNOSIS — Z792 Long term (current) use of antibiotics: Secondary | ICD-10-CM | POA: Diagnosis not present

## 2019-12-23 DIAGNOSIS — M1712 Unilateral primary osteoarthritis, left knee: Secondary | ICD-10-CM | POA: Insufficient documentation

## 2019-12-23 DIAGNOSIS — Z7982 Long term (current) use of aspirin: Secondary | ICD-10-CM | POA: Insufficient documentation

## 2019-12-23 DIAGNOSIS — J45909 Unspecified asthma, uncomplicated: Secondary | ICD-10-CM | POA: Insufficient documentation

## 2019-12-23 DIAGNOSIS — Z79899 Other long term (current) drug therapy: Secondary | ICD-10-CM | POA: Insufficient documentation

## 2019-12-23 DIAGNOSIS — Z01812 Encounter for preprocedural laboratory examination: Secondary | ICD-10-CM | POA: Insufficient documentation

## 2019-12-23 DIAGNOSIS — G4733 Obstructive sleep apnea (adult) (pediatric): Secondary | ICD-10-CM | POA: Insufficient documentation

## 2019-12-23 DIAGNOSIS — Z955 Presence of coronary angioplasty implant and graft: Secondary | ICD-10-CM | POA: Insufficient documentation

## 2019-12-23 DIAGNOSIS — Z7951 Long term (current) use of inhaled steroids: Secondary | ICD-10-CM | POA: Diagnosis not present

## 2019-12-23 DIAGNOSIS — I110 Hypertensive heart disease with heart failure: Secondary | ICD-10-CM | POA: Diagnosis not present

## 2019-12-23 HISTORY — DX: Unspecified osteoarthritis, unspecified site: M19.90

## 2019-12-23 HISTORY — DX: Angina pectoris, unspecified: I20.9

## 2019-12-23 HISTORY — DX: Personal history of urinary calculi: Z87.442

## 2019-12-23 HISTORY — DX: Pneumonia, unspecified organism: J18.9

## 2019-12-23 LAB — BASIC METABOLIC PANEL
Anion gap: 12 (ref 5–15)
BUN: 15 mg/dL (ref 6–20)
CO2: 22 mmol/L (ref 22–32)
Calcium: 9.2 mg/dL (ref 8.9–10.3)
Chloride: 105 mmol/L (ref 98–111)
Creatinine, Ser: 0.74 mg/dL (ref 0.44–1.00)
GFR, Estimated: >60 mL/min (ref >60)
Glucose, Bld: 99 mg/dL (ref 70–99)
Potassium: 4 mmol/L (ref 3.5–5.1)
Sodium: 139 mmol/L (ref 135–145)

## 2019-12-23 LAB — SURGICAL PCR SCREEN
MRSA, PCR: NEGATIVE
Staphylococcus aureus: NEGATIVE

## 2019-12-23 LAB — CBC
HCT: 40.3 % (ref 36.0–46.0)
Hemoglobin: 12.5 g/dL (ref 12.0–15.0)
MCH: 27.1 pg (ref 26.0–34.0)
MCHC: 31 g/dL (ref 30.0–36.0)
MCV: 87.4 fL (ref 80.0–100.0)
Platelets: 300 10*3/uL (ref 150–400)
RBC: 4.61 MIL/uL (ref 3.87–5.11)
RDW: 15 % (ref 11.5–15.5)
WBC: 5.9 10*3/uL (ref 4.0–10.5)
nRBC: 0 % (ref 0.0–0.2)

## 2019-12-23 NOTE — Progress Notes (Signed)
COVID Vaccine Completed: Yes Date COVID Vaccine completed: 12/11/19 [place] COVID vaccine manufacturer: Pfizer     PCP - Dr. Benita Stabile Cardiologist - Dr. Hedy Camara. LOV: 05/22/19  Chest x-ray - 02/23/19 EKG - 05/22/19. Requested from [facility] Cardiology. Stress Test -  ECHO - 05/22/16 Cardiac Cath -  Pacemaker/ICD device last checked:  Sleep Study - Yes CPAP - Yes  Fasting Blood Sugar -  Checks Blood Sugar _____ times a day  Blood Thinner Instructions: Hold Aspirin and Brilinta six day before surgery Aspirin Instructions: Dr. Percell Miller. Last Dose:  Anesthesia review: Hx: HTN,CHF,CAD,MI,Cardiac arrest,OSA(CPAP).  Patient denies shortness of breath, fever, cough and chest pain at PAT appointment   Patient verbalized understanding of instructions that were given to them at the PAT appointment. Patient was also instructed that they will need to review over the PAT instructions again at home before surgery.

## 2019-12-24 ENCOUNTER — Other Ambulatory Visit (HOSPITAL_COMMUNITY): Payer: BC Managed Care – PPO

## 2019-12-24 NOTE — Progress Notes (Signed)
Anesthesia Chart Review   Case: [id number] Date/Time: 01/05/20 0715   Procedure: TOTAL KNEE ARTHROPLASTY (Left Knee)   Anesthesia type: Choice   Pre-op diagnosis: OA LEFT KNEE   Location: [facility] ROOM 08 / [facility]   Surgeons: Renette Butters, MD      DISCUSSION:59 y.o. never smoker with h/o PONV, asthma, HTN, CHF, CAD (stent to LAD, lcx) sleep apnea, left knee OA scheduled for above procedure 01/05/2020 with Dr. Edmonia Lynch.   Pt last seen by cardiology 05/22/2019. Stress test ordered at this visit.  Stress test 05/26/2019 low risk study.  Clearance on chart.  Advised to hold Brillinta 6 days prior to procedure.   Anticipate pt can proceed with planned procedure barring acute status change.   VS: BP 131/76   Pulse 84   Temp 36.9 C (Oral)   Resp 16   Ht 5' 5"  (1.651 m)   Wt 92.1 kg   SpO2 98%   BMI 33.78 kg/m   PROVIDERS: Albina Billet, MD is PCP   Patrecia Pace, MD is Cardiologist  LABS: Labs reviewed: Acceptable for surgery. (all labs ordered are listed, but only abnormal results are displayed)  Labs Reviewed  SURGICAL PCR SCREEN  CBC  BASIC METABOLIC PANEL     IMAGES:   EKG:   CV: Stress Test 05/26/2019 Results:  Lexiscan Stress Test:  Resting ECG showed: Normal sinus rhythm. Left axis deviation. Low QRS voltage. Cannot rule out anterior wall infarction age undetermined. Abnormal ECG.  Resting heart rate was 100 bpm. Maximum heart rate was 125 bpm.   Resting blood pressure was 130/80 mmHg, at 3 minutes 150/90 mmHg, and Recovery blood pressure was 110/70.  Stress ECG showed: No change in baseline abnormalities.  Arrhythmia: Rare PVC.  Chest pain: None.   Conclusion: In summary stress test was nondiagnostic negative for ischemia.   Echo 05/22/2016 Study Conclusions   - Left ventricle: The cavity size was normal. Wall thickness was at  the upper limits of normal. Systolic function was vigorous. The  estimated ejection fraction was in the range of  65% to 70%.  Doppler parameters are consistent with abnormal left ventricular  relaxation (grade 1 diastolic dysfunction).  - Right ventricle: The cavity size was normal. Systolic function  was normal.  - Atrial septum: Agitated saline contrast study showed no  right-to-left atrial level shunt, in the baseline  Echo 08/23/2015 Study Conclusions   - Left ventricle: The cavity size was normal. Systolic function was  normal. The estimated ejection fraction was in the range of 60%  to 65%. Wall motion was normal; there were no regional wall  motion abnormalities. Left ventricular diastolic function  parameters were normal.  - Left atrium: The atrium was normal in size.  - Right ventricle: Systolic function was normal.  - Pulmonary arteries: Systolic pressure was within the normal  range.   Impressions:   - Normal study.  Past Medical History:  Diagnosis Date  . Abnormal Pap smear of cervix 1988  . Acid reflux 01/17/2013  . Allergic rhinitis   . Anemia   . Anginal pain ([place])   . Anxiety   . Arthritis   . Asthma   . Celiac disease   . Cervical dysplasia 1988  . CHF (congestive heart failure) ([place])   . Complication of anesthesia    bp drops after anesthesia and has to be kept overnight  . Coronary artery disease   . Heart trouble   . History of cardiac arrest   .  History of kidney stones   . History of mammogram 03/25/2013; 12/07/14   BIRADS 1; NEG  . History of Papanicolaou smear of cervix 03/20/12; 12/28/15   -/-; -/-  . Hypercholesteremia   . Hypertension   . Menopausal symptoms   . MI (myocardial infarction) ([place])   . Mitral valve prolapse   . Pneumonia   . PONV (postoperative nausea and vomiting)    nausea only  . Sleep apnea   . Vulvovaginitis    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    Past Surgical History:  Procedure Laterality Date  . ABLATION  2013   [facility]  . BACK SURGERY    . BACK SURGERY  2016; 2017   L4, L5  .  CARDIAC SURGERY  03/19/12; 10/2014   HEART CATH AND STENT  . [facility]; 1989  . COLD KNIFE CONE BIOPSY  1988  . COLONOSCOPY  08/2010   16 POLYPS (ALL BENIGN) DX C CELIAC DISEASE  . COMBINED HYSTEROSCOPY DIAGNOSTIC / D&C  2013   [facility]  . CYSTOSCOPY W/ RETROGRADES Left 01/20/2019   Procedure: CYSTOSCOPY WITH RETROGRADE PYELOGRAM;  Surgeon: Abbie Sons, MD;  Location: [facility] ORS;  Service: Urology;  Laterality: Left;  . CYSTOSCOPY W/ URETERAL STENT PLACEMENT Left 12/13/2018   Procedure: CYSTOSCOPY WITH RETROGRADE PYELOGRAM/URETERAL STENT PLACEMENT;  Surgeon: Irine Seal, MD;  Location: [facility] ORS;  Service: Urology;  Laterality: Left;  . CYSTOSCOPY/URETEROSCOPY/HOLMIUM LASER/STENT PLACEMENT Left 01/20/2019   Procedure: CYSTOSCOPY/URETEROSCOPY/HOLMIUM LASER/STENT EXCHANGE;  Surgeon: Abbie Sons, MD;  Location: [facility] ORS;  Service: Urology;  Laterality: Left;  . DILATION AND CURETTAGE OF UTERUS  2001  . ESOPHAGOGASTRODUODENOSCOPY  08/2010   DX C CELIAC DISEASE  . HEART STENTS  05/27/2011  . HIP SURGERY  2016   RIGHT IT BAND AND BURSA REMOVAL  . OOPHORECTOMY Right 1982    MEDICATIONS: . albuterol (VENTOLIN HFA) 108 (90 Base) MCG/ACT inhaler  . amLODipine (NORVASC) 5 MG tablet  . amoxicillin (AMOXIL) 500 MG tablet  . Budeson-Glycopyrrol-Formoterol (BREZTRI AEROSPHERE) 160-9-4.8 MCG/ACT AERO  . chlorpheniramine-HYDROcodone (TUSSIONEX) 10-8 MG/5ML SUER  . clotrimazole-betamethasone (LOTRISONE) cream  . [facility] ASPIRIN ADULT LOW DOSE 81 MG chewable tablet  . diclofenac Sodium (VOLTAREN) 1 % GEL  . famotidine (PEPCID) 40 MG tablet  . LORazepam (ATIVAN) 1 MG tablet  . montelukast (SINGULAIR) 10 MG tablet  . NITROSTAT 0.4 MG SL tablet  . pantoprazole (PROTONIX) 40 MG tablet  . ticagrelor (BRILINTA) 60 MG TABS tablet   No current facility-administered medications for this encounter.    Konrad Felix, PA-C [facility] Pre-Surgical Testing [phone]

## 2019-12-25 NOTE — Anesthesia Preprocedure Evaluation (Addendum)
Anesthesia Evaluation  Patient identified by MRN, date of birth, ID band Patient awake    Reviewed: Allergy & Precautions, H&P , NPO status , Patient's Chart, lab work & pertinent test results  History of Anesthesia Complications (+) PONV and history of anesthetic complications  Airway Mallampati: II  TM Distance: >3 FB Neck ROM: Full    Dental no notable dental hx.    Pulmonary asthma , sleep apnea ,    Pulmonary exam normal breath sounds clear to auscultation       Cardiovascular hypertension, Pt. on medications + angina with exertion + CAD, + Past MI and +CHF  Normal cardiovascular exam Rhythm:Regular Rate:Normal     Neuro/Psych PSYCHIATRIC DISORDERS Anxiety negative neurological ROS     GI/Hepatic Neg liver ROS, hiatal hernia, PUD, GERD  Medicated,  Endo/Other  negative endocrine ROSMorbid obesity  Renal/GU   negative genitourinary   Musculoskeletal  (+) Arthritis , Osteoarthritis,    Abdominal   Peds negative pediatric ROS (+)  Hematology negative hematology ROS (+) Blood dyscrasia, anemia ,   Anesthesia Other Findings   Reproductive/Obstetrics negative OB ROS                          Anesthesia Physical Anesthesia Plan  ASA: III  Anesthesia Plan: Spinal   Post-op Pain Management:  Regional for Post-op pain   Induction:   PONV Risk Score and Plan: 3  Airway Management Planned: Nasal Cannula, Natural Airway, Simple Face Mask and Mask  Additional Equipment: None  Intra-op Plan:   Post-operative Plan:   Informed Consent:     Dental advisory given  Plan Discussed with: Anesthesiologist and CRNA  Anesthesia Plan Comments: (See PAT note 12/23/2019, Konrad Felix, PA-C  CV: Stress Test 05/26/2019 Results:  [place] Adam Stress Test:  Resting ECG showed: Normal sinus rhythm. Left axis deviation. Low QRS voltage. Cannot rule out anterior wall infarction age  undetermined. Abnormal ECG.  Resting heart rate was 100 bpm. Maximum heart rate was 125 bpm.   Resting blood pressure was 130/80 mmHg, at 3 minutes 150/90 mmHg, and Recovery blood pressure was 110/70.  Stress ECG showed: No change in baseline abnormalities.  Arrhythmia: Rare PVC.  Chest pain: None.   Conclusion: In summary stress test was nondiagnostic negative for ischemia.   Echo 05/22/2016 Study Conclusions   - Left ventricle: The cavity size was normal. Wall thickness was at  the upper limits of normal. Systolic function was vigorous. The  estimated ejection fraction was in the range of 65% to 70%.  Doppler parameters are consistent with abnormal left ventricular  relaxation (grade 1 diastolic dysfunction).  - Right ventricle: The cavity size was normal. Systolic function  was normal.  - Atrial septum: Agitated saline contrast study showed no  right-to-left atrial level shunt, in the baseline  Echo 08/23/2015 Study Conclusions   - Left ventricle: The cavity size was normal. Systolic function was  normal. The estimated ejection fraction was in the range of 60%  to 65%. Wall motion was normal; there were no regional wall  motion abnormalities. Left ventricular diastolic function  parameters were normal.  - Left atrium: The atrium was normal in size.  - Right ventricle: Systolic function was normal.  - Pulmonary arteries: Systolic pressure was within the normal  range.  )      Anesthesia Quick Evaluation

## 2019-12-30 NOTE — Discharge Instructions (Signed)

## 2020-01-04 ENCOUNTER — Other Ambulatory Visit (HOSPITAL_COMMUNITY)
Admission: RE | Admit: 2020-01-04 | Discharge: 2020-01-04 | Disposition: A | Discharge status: Home / Self Care | Payer: BC Managed Care – PPO | Source: Ambulatory Visit | Attending: Orthopedic Surgery | Admitting: Orthopedic Surgery

## 2020-01-04 DIAGNOSIS — Z20822 Contact with and (suspected) exposure to covid-19: Secondary | ICD-10-CM | POA: Diagnosis not present

## 2020-01-04 DIAGNOSIS — Z01812 Encounter for preprocedural laboratory examination: Secondary | ICD-10-CM | POA: Diagnosis not present

## 2020-01-04 LAB — SARS CORONAVIRUS 2 (TAT 6-24 HRS): SARS Coronavirus 2: NEGATIVE

## 2020-01-07 NOTE — Progress Notes (Signed)
Spoke to patient and gave her new surgery time, arrival time of 10:30 and NPO time of 10:00.  Patient stated understanding and will come in for labs on 01-15-20.

## 2020-01-07 NOTE — Patient Instructions (Addendum)
DUE TO COVID-19 ONLY ONE VISITOR IS ALLOWED TO COME WITH YOU AND STAY IN THE WAITING ROOM ONLY DURING PRE OP AND PROCEDURE.   IF YOU WILL BE ADMITTED INTO THE HOSPITAL YOU ARE ALLOWED ONE SUPPORT PERSON DURING VISITATION HOURS ONLY (10AM -8PM)   . The support person may change daily. . The support person must pass our screening, gel in and out, and wear a mask at all times, including in the patient's room. . Patients must also wear a mask when staff or their support person are in the room.   COVID SWAB TESTING MUST BE COMPLETED ON:  Friday, 01-15-20 @ 2:45 PM   [address] [place], [place] [address]  (Must self quarantine after testing. Follow instructions on handout.)        Your procedure is scheduled on:  Tuesday, 01-19-20   Report to [facility] Main  Entrance     Report to admitting at 10:30 PM   Call this number if you have problems the morning of surgery [phone]   Do not eat food :After Midnight.   May have liquids until 10:00 AM  day of surgery  CLEAR LIQUID DIET  Foods Allowed                                                                     Foods Excluded  Water, Black Coffee and tea, regular and decaf             liquids that you cannot  Plain Jell-O in any flavor  (No red)                                   see through such as: Fruit ices (not with fruit pulp)                                      milk, soups, orange juice              Iced Popsicles (No red)                                      All solid food                                   Apple juices Sports drinks like Gatorade (No red) Lightly seasoned clear broth or consume(fat free) Sugar, honey syrup   Complete one Ensure drink the morning of surgery at 10:00 AM  the day of surgery.   Oral Hygiene is also important to reduce your risk of infection.                                    Remember - BRUSH YOUR TEETH THE MORNING OF SURGERY WITH YOUR REGULAR TOOTHPASTE   Do NOT smoke after  Midnight   Take these medicines the morning of surgery  with A SIP OF WATER:  Amlodipine, Pantoprazole, ok to use inhalers, Lorazepam if needed                                You may not have any metal on your body including hair pins, jewelry, and body piercings             Do not wear make-up, lotions, powders, perfumes/cologne, or deodorant             Do not wear nail polish.  Do not shave  48 hours prior to surgery.      Do not bring valuables to the hospital. [place].   Contacts, dentures or bridgework may not be worn into surgery.   Bring small overnight bag day of surgery.                 Please read over the following fact sheets you were given: IF YOU HAVE QUESTIONS ABOUT YOUR PRE OP INSTRUCTIONS PLEASE CALL [phone]   [place] - Preparing for Surgery Before surgery, you can play an important role.  Because skin is not sterile, your skin needs to be as free of germs as possible.  You can reduce the number of germs on your skin by washing with CHG (chlorahexidine gluconate) soap before surgery.  CHG is an antiseptic cleaner which kills germs and bonds with the skin to continue killing germs even after washing. Please DO NOT use if you have an allergy to CHG or antibacterial soaps.  If your skin becomes reddened/irritated stop using the CHG and inform your nurse when you arrive at Short Stay. Do not shave (including legs and underarms) for at least 48 hours prior to the first CHG shower.  You may shave your face/neck.  Please follow these instructions carefully:  1.  Shower with CHG Soap the night before surgery and the  morning of surgery.  2.  If you choose to wash your hair, wash your hair first as usual with your normal  shampoo.  3.  After you shampoo, rinse your hair and body thoroughly to remove the shampoo.                             4.  Use CHG as you would any other liquid soap.  You can apply chg directly to the skin and  wash.  Gently with a scrungie or clean washcloth.  5.  Apply the CHG Soap to your body ONLY FROM THE NECK DOWN.   Do   not use on face/ open                           Wound or open sores. Avoid contact with eyes, ears mouth and   genitals (private parts).                       Wash face,  Genitals (private parts) with your normal soap.             6.  Wash thoroughly, paying special attention to the area where your    surgery  will be performed.  7.  Thoroughly rinse your body with warm water from the neck down.  8.  DO NOT shower/wash with your normal soap after using and rinsing  off the CHG Soap.                9.  Pat yourself dry with a clean towel.            10.  Wear clean pajamas.            11.  Place clean sheets on your bed the night of your first shower and do not  sleep with pets. Day of Surgery : Do not apply any lotions/deodorants the morning of surgery.  Please wear clean clothes to the hospital/surgery center.  FAILURE TO FOLLOW THESE INSTRUCTIONS MAY RESULT IN THE CANCELLATION OF YOUR SURGERY  PATIENT SIGNATURE_________________________________  NURSE SIGNATURE__________________________________  ________________________________________________________________________   Adam Phenix  An incentive spirometer is a tool that can help keep your lungs clear and active. This tool measures how well you are filling your lungs with each breath. Taking long deep breaths may help reverse or decrease the chance of developing breathing (pulmonary) problems (especially infection) following:  A long period of time when you are unable to move or be active. BEFORE THE PROCEDURE   If the spirometer includes an indicator to show your best effort, your nurse or respiratory therapist will set it to a desired goal.  If possible, sit up straight or lean slightly forward. Try not to slouch.  Hold the incentive spirometer in an upright position. INSTRUCTIONS FOR USE  1. Sit on the  edge of your bed if possible, or sit up as far as you can in bed or on a chair. 2. Hold the incentive spirometer in an upright position. 3. Breathe out normally. 4. Place the mouthpiece in your mouth and seal your lips tightly around it. 5. Breathe in slowly and as deeply as possible, raising the piston or the ball toward the top of the column. 6. Hold your breath for 3-5 seconds or for as long as possible. Allow the piston or ball to fall to the bottom of the column. 7. Remove the mouthpiece from your mouth and breathe out normally. 8. Rest for a few seconds and repeat Steps 1 through 7 at least 10 times every 1-2 hours when you are awake. Take your time and take a few normal breaths between deep breaths. 9. The spirometer may include an indicator to show your best effort. Use the indicator as a goal to work toward during each repetition. 10. After each set of 10 deep breaths, practice coughing to be sure your lungs are clear. If you have an incision (the cut made at the time of surgery), support your incision when coughing by placing a pillow or rolled up towels firmly against it. Once you are able to get out of bed, walk around indoors and cough well. You may stop using the incentive spirometer when instructed by your caregiver.  RISKS AND COMPLICATIONS  Take your time so you do not get dizzy or light-headed.  If you are in pain, you may need to take or ask for pain medication before doing incentive spirometry. It is harder to take a deep breath if you are having pain. AFTER USE  Rest and breathe slowly and easily.  It can be helpful to keep track of a log of your progress. Your caregiver can provide you with a simple table to help with this. If you are using the spirometer at home, follow these instructions: [place] IF:   You are having difficultly using the spirometer.  You have trouble using the spirometer  as often as instructed.  Your pain medication is not giving enough  relief while using the spirometer.  You develop fever of 100.5 F (38.1 C) or higher. SEEK IMMEDIATE MEDICAL CARE IF:   You cough up bloody sputum that had not been present before.  You develop fever of 102 F (38.9 C) or greater.  You develop worsening pain at or near the incision site. MAKE SURE YOU:   Understand these instructions.  Will watch your condition.  Will get help right away if you are not doing well or get worse. Document Released: 05/07/2006 Document Revised: 03/19/2011 Document Reviewed: 07/08/2006 [facility] Patient Information 2014 [place], [place].   ________________________________________________________________________

## 2020-01-15 ENCOUNTER — Other Ambulatory Visit (HOSPITAL_COMMUNITY)
Admission: RE | Admit: 2020-01-15 | Discharge: 2020-01-15 | Disposition: A | Discharge status: Home / Self Care | Payer: BC Managed Care – PPO | Source: Ambulatory Visit | Attending: Orthopedic Surgery | Admitting: Orthopedic Surgery

## 2020-01-15 ENCOUNTER — Encounter (HOSPITAL_COMMUNITY)
Admission: RE | Admit: 2020-01-15 | Discharge: 2020-01-15 | Disposition: A | Discharge status: Home / Self Care | Payer: BC Managed Care – PPO | Source: Ambulatory Visit | Attending: Orthopedic Surgery | Admitting: Orthopedic Surgery

## 2020-01-15 ENCOUNTER — Other Ambulatory Visit: Payer: Self-pay

## 2020-01-15 DIAGNOSIS — Z20822 Contact with and (suspected) exposure to covid-19: Secondary | ICD-10-CM | POA: Insufficient documentation

## 2020-01-15 DIAGNOSIS — Z01812 Encounter for preprocedural laboratory examination: Secondary | ICD-10-CM | POA: Insufficient documentation

## 2020-01-15 LAB — CBC
HCT: 39.4 % (ref 36.0–46.0)
Hemoglobin: 12.3 g/dL (ref 12.0–15.0)
MCH: 27.2 pg (ref 26.0–34.0)
MCHC: 31.2 g/dL (ref 30.0–36.0)
MCV: 87 fL (ref 80.0–100.0)
Platelets: 299 10*3/uL (ref 150–400)
RBC: 4.53 MIL/uL (ref 3.87–5.11)
RDW: 15.5 % (ref 11.5–15.5)
WBC: 9.2 10*3/uL (ref 4.0–10.5)
nRBC: 0 % (ref 0.0–0.2)

## 2020-01-15 LAB — BASIC METABOLIC PANEL
Anion gap: 13 (ref 5–15)
BUN: 17 mg/dL (ref 6–20)
CO2: 24 mmol/L (ref 22–32)
Calcium: 9.3 mg/dL (ref 8.9–10.3)
Chloride: 102 mmol/L (ref 98–111)
Creatinine, Ser: 1.02 mg/dL — ABNORMAL HIGH (ref 0.44–1.00)
GFR, Estimated: >60 mL/min (ref >60)
Glucose, Bld: 96 mg/dL (ref 70–99)
Potassium: 3.9 mmol/L (ref 3.5–5.1)
Sodium: 139 mmol/L (ref 135–145)

## 2020-01-15 LAB — SURGICAL PCR SCREEN
MRSA, PCR: NEGATIVE
Staphylococcus aureus: NEGATIVE

## 2020-01-15 LAB — SARS CORONAVIRUS 2 (TAT 6-24 HRS): SARS Coronavirus 2: NEGATIVE

## 2020-01-15 NOTE — Patient Instructions (Addendum)
DUE TO COVID-19 ONLY ONE VISITOR IS ALLOWED TO COME WITH YOU AND STAY IN THE WAITING ROOM ONLY DURING PRE OP AND PROCEDURE.   IF YOU WILL BE ADMITTED INTO THE HOSPITAL YOU ARE ALLOWED ONE SUPPORT PERSON DURING VISITATION HOURS ONLY (10AM -8PM)    The support person may change daily.  The support person must pass our screening, gel in and out, and wear a mask at all times, including in the patient's room.  Patients must also wear a mask when staff or their support person are in the room.              COVID SWAB TESTING MUST BE COMPLETED ON:  Friday, 01-15-20 @ 2:45 PM                         [address] [place], [place] [address]  (Must self quarantine after testing. Follow instructions on handout.)                   Your procedure is scheduled on:  Tuesday, 01-19-20              Report to [facility] Main  Entrance               Report to admitting at 7:45 AM              Call this number if you have problems the morning of surgery [phone]              Do not eat food :After Midnight.              May have liquids until 7:45 AM  day of surgery  CLEAR LIQUID DIET  Foods Allowed                                                                     Foods Excluded  Water, Black Coffee and tea, regular and decaf               liquids that you cannot  Plain Jell-O in any flavor  (No red)                                     see through such as: Fruit ices (not with fruit pulp)                                             milk, soups, orange juice              Iced Popsicles (No red)                                                        All solid food  Apple juices Sports drinks like Gatorade (No red) Lightly seasoned clear broth or consume(fat free) Sugar, honey syrup              Complete one Ensure drink the morning of surgery at 7:45 AM  the day of surgery.  Oral Hygieneis also important to reduce your risk of  infection.                                   Remember - BRUSH YOUR TEETH THE MORNING OF SURGERY WITH YOUR REGULAR TOOTHPASTE              Do NOT smoke after Midnight              Take these medicines the morning of surgery with A SIP OF WATER:  Amlodipine, Pantoprazole, ok to use inhalers, Lorazepam if needed                                You may not have any metal on your body including hair pins, jewelry, and body piercings             Do not wear make-up, lotions, powders, perfumes/cologne, or deodorant             Do not wear nail polish.  Do not shave  48 hours prior to surgery.                 Do not bring valuables to the hospital. [place].              Contacts, dentures or bridgework may not be worn into surgery.              Bring small overnight bag day of surgery.                                       Please read over the following fact sheets you were given: IF YOU HAVE QUESTIONS ABOUT YOUR PRE OP INSTRUCTIONS PLEASE CALL [phone]   [place] - Preparing for Surgery Before surgery, you can play an important role.  Because skin is not sterile, your skin needs to be as free of germs as possible.  You can reduce the number of germs on your skin by washing with CHG (chlorahexidine gluconate) soap before surgery.  CHG is an antiseptic cleaner which kills germs and bonds with the skin to continue killing germs even after washing. Please DO NOT use if you have an allergy to CHG or antibacterial soaps.  If your skin becomes reddened/irritated stop using the CHG and inform your nurse when you arrive at Short Stay. Do not shave (including legs and underarms) for at least 48 hours prior to the first CHG shower.  You may shave your face/neck.  Please follow these instructions carefully:             1.  Shower with CHG Soap the night before surgery and the  morning of surgery.             2.  If you choose to wash your hair, wash  your hair first as usual with your normal  shampoo.  3.  After you shampoo, rinse your hair and body thoroughly to remove the shampoo.                                                 4.  Use CHG as you would any other liquid soap.  You can apply chg directly to the skin and wash.  Gently with a scrungie or clean washcloth.             5.  Apply the CHG Soap to your body ONLY FROM THE NECK DOWN.   Do                not use on face/ open                           Wound or open sores. Avoid contact with eyes, ears mouth and                        genitals (private parts).                       Wash face,  Genitals (private parts) with your normal soap.             6.  Wash thoroughly, paying special attention to the area where your                                     surgery  will be performed.             7.  Thoroughly rinse your body with warm water from the neck down.             8.  DO NOT shower/wash with your normal soap after using and rinsing off the CHG Soap.                9.  Pat yourself dry with a clean towel.            10.  Wear clean pajamas.            11.  Place clean sheets on your bed the night of your first shower and do not  sleep with pets. Day of Surgery : Do not apply any lotions/deodorants the morning of surgery.  Please wear clean clothes to the hospital/surgery center.  FAILURE TO FOLLOW THESE INSTRUCTIONS MAY RESULT IN THE CANCELLATION OF YOUR SURGERY  PATIENT SIGNATURE_________________________________  NURSE SIGNATURE__________________________________  ________________________________________________________________________   Brenda Craig  An incentive spirometer is a tool that can help keep your lungs clear and active. This tool measures how well you are filling your lungs with each breath. Taking long deep breaths may help reverse or decrease the chance of developing breathing (pulmonary) problems (especially infection) following:  A  long period of time when you are unable to move or be active. BEFORE THE PROCEDURE   If the spirometer includes an indicator to show your best effort, your nurse or respiratory therapist will set it to a desired goal.  If possible, sit up straight or lean slightly forward. Try not to slouch.  Hold the incentive spirometer in an upright position. INSTRUCTIONS FOR USE  1. Sit on the edge  of your bed if possible, or sit up as far as you can in bed or on a chair. 2. Hold the incentive spirometer in an upright position. 3. Breathe out normally. 4. Place the mouthpiece in your mouth and seal your lips tightly around it. 5. Breathe in slowly and as deeply as possible, raising the piston or the ball toward the top of the column. 6. Hold your breath for 3-5 seconds or for as long as possible. Allow the piston or ball to fall to the bottom of the column. 7. Remove the mouthpiece from your mouth and breathe out normally. 8. Rest for a few seconds and repeat Steps 1 through 7 at least 10 times every 1-2 hours when you are awake. Take your time and take a few normal breaths between deep breaths. 9. The spirometer may include an indicator to show your best effort. Use the indicator as a goal to work toward during each repetition. 10. After each set of 10 deep breaths, practice coughing to be sure your lungs are clear. If you have an incision (the cut made at the time of surgery), support your incision when coughing by placing a pillow or rolled up towels firmly against it. Once you are able to get out of bed, walk around indoors and cough well. You may stop using the incentive spirometer when instructed by your caregiver.  RISKS AND COMPLICATIONS  Take your time so you do not get dizzy or light-headed.  If you are in pain, you may need to take or ask for pain medication before doing incentive spirometry. It is harder to take a deep breath if you are having pain. AFTER USE  Rest and breathe slowly and  easily.  It can be helpful to keep track of a log of your progress. Your caregiver can provide you with a simple table to help with this. If you are using the spirometer at home, follow these instructions: [place] IF:   You are having difficultly using the spirometer.  You have trouble using the spirometer as often as instructed.  Your pain medication is not giving enough relief while using the spirometer.  You develop fever of 100.5 F (38.1 C) or higher. SEEK IMMEDIATE MEDICAL CARE IF:   You cough up bloody sputum that had not been present before.  You develop fever of 102 F (38.9 C) or greater.  You develop worsening pain at or near the incision site. MAKE SURE YOU:   Understand these instructions.  Will watch your condition.  Will get help right away if you are not doing well or get worse. Document Released: 05/07/2006 Document Revised: 03/19/2011 Document Reviewed: 07/08/2006 [facility] Patient Information 2014 [place], [place].   ________________________________________________________________________

## 2020-01-18 NOTE — Progress Notes (Signed)
Pt aware of surgical time change for surgery scheduled on 01/19/2020. Pt aware to arrive at [facility] admitting at [place]. No food after midnight; clear liquids from midnight till 0700 consuming entire presurgery drink at 0700 then nothing by mouth.

## 2020-01-19 ENCOUNTER — Ambulatory Visit (HOSPITAL_COMMUNITY): Payer: BC Managed Care – PPO | Admitting: Physician Assistant

## 2020-01-19 ENCOUNTER — Inpatient Hospital Stay (HOSPITAL_COMMUNITY)
Admission: AD | Admit: 2020-01-19 | Discharge: 2020-01-23 | DRG: 469 | Disposition: A | Discharge status: Home Health | Payer: BC Managed Care – PPO | Attending: Orthopedic Surgery | Admitting: Orthopedic Surgery

## 2020-01-19 ENCOUNTER — Ambulatory Visit (HOSPITAL_COMMUNITY): Payer: BC Managed Care – PPO

## 2020-01-19 ENCOUNTER — Encounter (HOSPITAL_COMMUNITY)
Admission: AD | Disposition: A | Discharge status: Home Health | Payer: Self-pay | Source: Home / Self Care | Attending: Orthopedic Surgery

## 2020-01-19 ENCOUNTER — Other Ambulatory Visit: Payer: Self-pay

## 2020-01-19 ENCOUNTER — Encounter (HOSPITAL_COMMUNITY): Payer: Self-pay | Admitting: Orthopedic Surgery

## 2020-01-19 DIAGNOSIS — E785 Hyperlipidemia, unspecified: Secondary | ICD-10-CM | POA: Diagnosis present

## 2020-01-19 DIAGNOSIS — J9601 Acute respiratory failure with hypoxia: Secondary | ICD-10-CM | POA: Diagnosis not present

## 2020-01-19 DIAGNOSIS — Z79899 Other long term (current) drug therapy: Secondary | ICD-10-CM

## 2020-01-19 DIAGNOSIS — Z6833 Body mass index (BMI) 33.0-33.9, adult: Secondary | ICD-10-CM

## 2020-01-19 DIAGNOSIS — M1712 Unilateral primary osteoarthritis, left knee: Principal | ICD-10-CM | POA: Diagnosis present

## 2020-01-19 DIAGNOSIS — I10 Essential (primary) hypertension: Secondary | ICD-10-CM | POA: Diagnosis present

## 2020-01-19 DIAGNOSIS — I110 Hypertensive heart disease with heart failure: Secondary | ICD-10-CM | POA: Diagnosis present

## 2020-01-19 DIAGNOSIS — R0902 Hypoxemia: Secondary | ICD-10-CM

## 2020-01-19 DIAGNOSIS — I5032 Chronic diastolic (congestive) heart failure: Secondary | ICD-10-CM | POA: Diagnosis present

## 2020-01-19 DIAGNOSIS — Z96652 Presence of left artificial knee joint: Secondary | ICD-10-CM

## 2020-01-19 DIAGNOSIS — Z8709 Personal history of other diseases of the respiratory system: Secondary | ICD-10-CM

## 2020-01-19 DIAGNOSIS — J45909 Unspecified asthma, uncomplicated: Secondary | ICD-10-CM | POA: Diagnosis present

## 2020-01-19 DIAGNOSIS — R197 Diarrhea, unspecified: Secondary | ICD-10-CM | POA: Diagnosis present

## 2020-01-19 DIAGNOSIS — J9811 Atelectasis: Secondary | ICD-10-CM | POA: Diagnosis not present

## 2020-01-19 DIAGNOSIS — Z7901 Long term (current) use of anticoagulants: Secondary | ICD-10-CM

## 2020-01-19 DIAGNOSIS — Z7982 Long term (current) use of aspirin: Secondary | ICD-10-CM

## 2020-01-19 DIAGNOSIS — Z20822 Contact with and (suspected) exposure to covid-19: Secondary | ICD-10-CM | POA: Diagnosis present

## 2020-01-19 DIAGNOSIS — N179 Acute kidney failure, unspecified: Secondary | ICD-10-CM | POA: Diagnosis present

## 2020-01-19 DIAGNOSIS — E7800 Pure hypercholesterolemia, unspecified: Secondary | ICD-10-CM | POA: Diagnosis present

## 2020-01-19 DIAGNOSIS — I2510 Atherosclerotic heart disease of native coronary artery without angina pectoris: Secondary | ICD-10-CM | POA: Diagnosis present

## 2020-01-19 DIAGNOSIS — K219 Gastro-esophageal reflux disease without esophagitis: Secondary | ICD-10-CM | POA: Diagnosis present

## 2020-01-19 DIAGNOSIS — I252 Old myocardial infarction: Secondary | ICD-10-CM

## 2020-01-19 DIAGNOSIS — Z955 Presence of coronary angioplasty implant and graft: Secondary | ICD-10-CM

## 2020-01-19 DIAGNOSIS — K900 Celiac disease: Secondary | ICD-10-CM | POA: Diagnosis present

## 2020-01-19 HISTORY — PX: TOTAL KNEE ARTHROPLASTY: SHX125

## 2020-01-19 SURGERY — ARTHROPLASTY, KNEE, TOTAL
Anesthesia: Spinal | Site: Knee | Laterality: Left

## 2020-01-19 MED ORDER — ASPIRIN EC 81 MG PO TBEC: 81.0000 mg | DELAYED_RELEASE_TABLET | Freq: Two times a day (BID) | ORAL | Status: DC

## 2020-01-19 MED ORDER — FENTANYL CITRATE (PF) 100 MCG/2ML IJ SOLN
INTRAMUSCULAR | Status: AC
  Filled 2020-01-19: qty 2

## 2020-01-19 MED ORDER — CEFAZOLIN SODIUM-DEXTROSE 2-4 GM/100ML-% IV SOLN
2.0000 g | Freq: Four times a day (QID) | INTRAVENOUS | Status: AC
Start: 2020-01-19 — End: 2020-01-20
  Filled 2020-01-19 (×2): qty 100

## 2020-01-19 MED ORDER — BUPIVACAINE IN DEXTROSE 0.75-8.25 % IT SOLN
INTRATHECAL | Status: DC | PRN
  Administered 2020-01-19: 1.8 mL via INTRATHECAL

## 2020-01-19 MED ORDER — PROPOFOL 10 MG/ML IV BOLUS
INTRAVENOUS | Status: AC
  Filled 2020-01-19: qty 20

## 2020-01-19 MED ORDER — TRANEXAMIC ACID-NACL 1000-0.7 MG/100ML-% IV SOLN
INTRAVENOUS | Status: AC
  Filled 2020-01-19: qty 100

## 2020-01-19 MED ORDER — PHENYLEPHRINE 40 MCG/ML (10ML) SYRINGE FOR IV PUSH (FOR BLOOD PRESSURE SUPPORT)
PREFILLED_SYRINGE | INTRAVENOUS | Status: AC
  Filled 2020-01-19: qty 10

## 2020-01-19 MED ORDER — TRANEXAMIC ACID-NACL 1000-0.7 MG/100ML-% IV SOLN
1000.0000 mg | Freq: Once | INTRAVENOUS | Status: AC
  Administered 2020-01-19: 1000 mg via INTRAVENOUS
  Filled 2020-01-19: qty 100

## 2020-01-19 MED ORDER — ONDANSETRON HCL 4 MG/2ML IJ SOLN: 4.0000 mg | Freq: Four times a day (QID) | INTRAMUSCULAR | Status: DC | PRN

## 2020-01-19 MED ORDER — METHOCARBAMOL 500 MG IVPB - SIMPLE MED
500.0000 mg | Freq: Four times a day (QID) | INTRAVENOUS | Status: DC | PRN
  Filled 2020-01-19: qty 50

## 2020-01-19 MED ORDER — FENTANYL CITRATE (PF) 100 MCG/2ML IJ SOLN
25.0000 ug | INTRAMUSCULAR | Status: DC | PRN
  Administered 2020-01-19 (×4): 25 ug via INTRAVENOUS

## 2020-01-19 MED ORDER — SENNOSIDES-DOCUSATE SODIUM 8.6-50 MG PO TABS: 1.0000 | ORAL_TABLET | Freq: Every evening | ORAL | Status: DC | PRN

## 2020-01-19 MED ORDER — CEFAZOLIN SODIUM-DEXTROSE 2-4 GM/100ML-% IV SOLN
INTRAVENOUS | Status: AC
  Administered 2020-01-19: 2 g via INTRAVENOUS
  Filled 2020-01-19: qty 100

## 2020-01-19 MED ORDER — ACETAMINOPHEN 500 MG PO TABS: 1000.0000 mg | ORAL_TABLET | Freq: Once | ORAL | Status: DC

## 2020-01-19 MED ORDER — METHOCARBAMOL 500 MG PO TABS
500.0000 mg | ORAL_TABLET | Freq: Four times a day (QID) | ORAL | Status: DC | PRN
  Filled 2020-01-19 (×2): qty 1

## 2020-01-19 MED ORDER — ACETAMINOPHEN 500 MG PO TABS
1000.0000 mg | ORAL_TABLET | Freq: Four times a day (QID) | ORAL | Status: AC
  Administered 2020-01-19 – 2020-01-20 (×4): 1000 mg via ORAL
  Filled 2020-01-19 (×3): qty 2

## 2020-01-19 MED ORDER — SODIUM CHLORIDE 0.9 % IR SOLN
Status: DC | PRN
  Administered 2020-01-19: 1000 mL

## 2020-01-19 MED ORDER — DOCUSATE SODIUM 100 MG PO CAPS
100.0000 mg | ORAL_CAPSULE | Freq: Two times a day (BID) | ORAL | Status: DC
  Administered 2020-01-22: 100 mg via ORAL
  Filled 2020-01-19 (×4): qty 1

## 2020-01-19 MED ORDER — METHOCARBAMOL 500 MG IVPB - SIMPLE MED
INTRAVENOUS | Status: AC
  Filled 2020-01-19: qty 50

## 2020-01-19 MED ORDER — CHLORHEXIDINE GLUCONATE 0.12 % MT SOLN
15.0000 mL | Freq: Once | OROMUCOSAL | Status: AC
  Administered 2020-01-19: 15 mL via OROMUCOSAL

## 2020-01-19 MED ORDER — ONDANSETRON HCL 4 MG/2ML IJ SOLN
INTRAMUSCULAR | Status: AC
  Filled 2020-01-19: qty 4

## 2020-01-19 MED ORDER — BUPIVACAINE LIPOSOME 1.3 % IJ SUSP
20.0000 mL | Freq: Once | INTRAMUSCULAR | Status: DC
  Filled 2020-01-19: qty 20

## 2020-01-19 MED ORDER — SODIUM CHLORIDE (PF) 0.9 % IJ SOLN
INTRAMUSCULAR | Status: AC
  Filled 2020-01-19: qty 10

## 2020-01-19 MED ORDER — EPHEDRINE 5 MG/ML INJ
INTRAVENOUS | Status: AC
  Filled 2020-01-19: qty 10

## 2020-01-19 MED ORDER — DEXAMETHASONE SODIUM PHOSPHATE 10 MG/ML IJ SOLN
INTRAMUSCULAR | Status: AC
  Filled 2020-01-19: qty 2

## 2020-01-19 MED ORDER — PHENYLEPHRINE 40 MCG/ML (10ML) SYRINGE FOR IV PUSH (FOR BLOOD PRESSURE SUPPORT)
PREFILLED_SYRINGE | INTRAVENOUS | Status: DC | PRN
  Administered 2020-01-19 (×4): 80 ug via INTRAVENOUS

## 2020-01-19 MED ORDER — DEXAMETHASONE SODIUM PHOSPHATE 10 MG/ML IJ SOLN
INTRAMUSCULAR | Status: DC | PRN
  Administered 2020-01-19: 10 mg via INTRAVENOUS

## 2020-01-19 MED ORDER — CELECOXIB 200 MG PO CAPS
200.0000 mg | ORAL_CAPSULE | Freq: Two times a day (BID) | ORAL | Status: DC
  Filled 2020-01-19 (×4): qty 1

## 2020-01-19 MED ORDER — ACETAMINOPHEN 500 MG PO TABS
ORAL_TABLET | ORAL | Status: AC
  Filled 2020-01-19: qty 2

## 2020-01-19 MED ORDER — OXYCODONE HCL 5 MG PO TABS
10.0000 mg | ORAL_TABLET | ORAL | Status: DC | PRN
Start: 2020-01-19 — End: 2020-01-21
  Administered 2020-01-19 – 2020-01-21 (×5): 10 mg via ORAL
  Filled 2020-01-19 (×3): qty 2
  Filled 2020-01-19: qty 3
  Filled 2020-01-19: qty 2

## 2020-01-19 MED ORDER — HYDROMORPHONE HCL 1 MG/ML IJ SOLN
0.5000 mg | INTRAMUSCULAR | Status: DC | PRN
  Administered 2020-01-19: 0.5 mg via INTRAVENOUS
  Administered 2020-01-20 – 2020-01-21 (×3): 1 mg via INTRAVENOUS
  Filled 2020-01-19 (×4): qty 1

## 2020-01-19 MED ORDER — SODIUM CHLORIDE 0.9% FLUSH
INTRAVENOUS | Status: DC | PRN
  Administered 2020-01-19: 30 mL

## 2020-01-19 MED ORDER — MEPERIDINE HCL 50 MG/ML IJ SOLN: 6.2500 mg | INTRAMUSCULAR | Status: DC | PRN

## 2020-01-19 MED ORDER — METOCLOPRAMIDE HCL 5 MG/ML IJ SOLN: 5.0000 mg | Freq: Three times a day (TID) | INTRAMUSCULAR | Status: DC | PRN

## 2020-01-19 MED ORDER — BUPIVACAINE LIPOSOME 1.3 % IJ SUSP
INTRAMUSCULAR | Status: DC | PRN
  Administered 2020-01-19: 20 mL

## 2020-01-19 MED ORDER — METHOCARBAMOL 500 MG PO TABS: 500.0000 mg | ORAL_TABLET | Freq: Four times a day (QID) | ORAL | Status: DC | PRN

## 2020-01-19 MED ORDER — PROPOFOL 500 MG/50ML IV EMUL
INTRAVENOUS | Status: DC | PRN
  Administered 2020-01-19: 100 ug/kg/min via INTRAVENOUS
  Administered 2020-01-19: 40 mg via INTRAVENOUS

## 2020-01-19 MED ORDER — DIPHENHYDRAMINE HCL 12.5 MG/5ML PO ELIX
12.5000 mg | ORAL_SOLUTION | ORAL | Status: DC | PRN
  Administered 2020-01-19 – 2020-01-23 (×10): 12.5 mg via ORAL
  Filled 2020-01-19: qty 5
  Filled 2020-01-19: qty 10
  Filled 2020-01-19 (×9): qty 5

## 2020-01-19 MED ORDER — ONDANSETRON HCL 4 MG/2ML IJ SOLN
4.0000 mg | Freq: Four times a day (QID) | INTRAMUSCULAR | Status: DC | PRN
  Administered 2020-01-20 – 2020-01-22 (×3): 4 mg via INTRAVENOUS
  Filled 2020-01-19 (×3): qty 2

## 2020-01-19 MED ORDER — ONDANSETRON HCL 4 MG PO TABS: 4.0000 mg | ORAL_TABLET | Freq: Four times a day (QID) | ORAL | Status: DC | PRN

## 2020-01-19 MED ORDER — ORAL CARE MOUTH RINSE: 15.0000 mL | Freq: Once | OROMUCOSAL | Status: DC

## 2020-01-19 MED ORDER — OXYCODONE HCL 5 MG PO TABS
5.0000 mg | ORAL_TABLET | ORAL | Status: DC | PRN
  Administered 2020-01-20 (×2): 10 mg via ORAL
  Filled 2020-01-19 (×2): qty 2

## 2020-01-19 MED ORDER — PANTOPRAZOLE SODIUM 40 MG PO TBEC
40.0000 mg | DELAYED_RELEASE_TABLET | Freq: Every day | ORAL | Status: DC
  Administered 2020-01-19 – 2020-01-23 (×5): 40 mg via ORAL
  Filled 2020-01-19 (×5): qty 1

## 2020-01-19 MED ORDER — ONDANSETRON HCL 4 MG/2ML IJ SOLN
4.0000 mg | Freq: Once | INTRAMUSCULAR | Status: AC | PRN
  Administered 2020-01-19: 4 mg via INTRAVENOUS

## 2020-01-19 MED ORDER — LACTATED RINGERS IV SOLN: INTRAVENOUS | Status: DC

## 2020-01-19 MED ORDER — GLYCOPYRROLATE PF 0.2 MG/ML IJ SOSY
PREFILLED_SYRINGE | INTRAMUSCULAR | Status: AC
  Filled 2020-01-19: qty 1

## 2020-01-19 MED ORDER — TRANEXAMIC ACID-NACL 1000-0.7 MG/100ML-% IV SOLN
1000.0000 mg | INTRAVENOUS | Status: AC
  Administered 2020-01-19: 1000 mg via INTRAVENOUS

## 2020-01-19 MED ORDER — BISACODYL 10 MG RE SUPP: 10.0000 mg | Freq: Every day | RECTAL | Status: DC | PRN

## 2020-01-19 MED ORDER — PHENYLEPHRINE HCL (PRESSORS) 10 MG/ML IV SOLN
INTRAVENOUS | Status: AC
  Filled 2020-01-19: qty 1

## 2020-01-19 MED ORDER — ROPIVACAINE HCL 7.5 MG/ML IJ SOLN
INTRAMUSCULAR | Status: DC | PRN
  Administered 2020-01-19: 25 mL via PERINEURAL

## 2020-01-19 MED ORDER — DIPHENHYDRAMINE HCL 12.5 MG/5ML PO ELIX
12.5000 mg | ORAL_SOLUTION | ORAL | Status: DC | PRN
  Filled 2020-01-19: qty 10

## 2020-01-19 MED ORDER — MIDAZOLAM HCL 2 MG/2ML IJ SOLN: 1.0000 mg | INTRAMUSCULAR | Status: DC

## 2020-01-19 MED ORDER — FENTANYL CITRATE (PF) 100 MCG/2ML IJ SOLN
INTRAMUSCULAR | Status: AC
  Administered 2020-01-19: 25 ug via INTRAVENOUS
  Filled 2020-01-19: qty 2

## 2020-01-19 MED ORDER — BISACODYL 10 MG RE SUPP
10.0000 mg | Freq: Every day | RECTAL | Status: DC | PRN
Start: 2020-01-19 — End: 2020-01-23

## 2020-01-19 MED ORDER — METOCLOPRAMIDE HCL 5 MG PO TABS
5.0000 mg | ORAL_TABLET | Freq: Three times a day (TID) | ORAL | Status: DC | PRN
  Filled 2020-01-19: qty 2

## 2020-01-19 MED ORDER — MENTHOL 3 MG MT LOZG: 1.0000 | LOZENGE | OROMUCOSAL | Status: DC | PRN

## 2020-01-19 MED ORDER — PHENOL 1.4 % MT LIQD
1.0000 | OROMUCOSAL | Status: DC | PRN
  Filled 2020-01-19: qty 177

## 2020-01-19 MED ORDER — MIDAZOLAM HCL 2 MG/2ML IJ SOLN
INTRAMUSCULAR | Status: AC
  Administered 2020-01-19: 2 mg via INTRAVENOUS
  Filled 2020-01-19: qty 2

## 2020-01-19 MED ORDER — METOCLOPRAMIDE HCL 5 MG PO TABS: 5.0000 mg | ORAL_TABLET | Freq: Three times a day (TID) | ORAL | Status: DC | PRN

## 2020-01-19 MED ORDER — PHENYLEPHRINE HCL-NACL 10-0.9 MG/250ML-% IV SOLN
INTRAVENOUS | Status: DC | PRN
  Administered 2020-01-19: 50 ug/min via INTRAVENOUS

## 2020-01-19 MED ORDER — FENTANYL CITRATE (PF) 100 MCG/2ML IJ SOLN: 50.0000 ug | INTRAMUSCULAR | Status: DC

## 2020-01-19 MED ORDER — ORAL CARE MOUTH RINSE: 15.0000 mL | Freq: Once | OROMUCOSAL | Status: AC

## 2020-01-19 MED ORDER — ASPIRIN 81 MG PO CHEW
81.0000 mg | CHEWABLE_TABLET | Freq: Two times a day (BID) | ORAL | Status: DC
  Administered 2020-01-19 – 2020-01-23 (×8): 81 mg via ORAL
  Filled 2020-01-19 (×8): qty 1

## 2020-01-19 MED ORDER — FENTANYL CITRATE (PF) 100 MCG/2ML IJ SOLN
INTRAMUSCULAR | Status: AC
  Administered 2020-01-19: 100 ug via INTRAVENOUS
  Filled 2020-01-19: qty 2

## 2020-01-19 MED ORDER — LACTATED RINGERS IV BOLUS
500.0000 mL | Freq: Once | INTRAVENOUS | Status: AC
  Administered 2020-01-19: 500 mL via INTRAVENOUS

## 2020-01-19 MED ORDER — CEFAZOLIN SODIUM-DEXTROSE 2-4 GM/100ML-% IV SOLN
2.0000 g | INTRAVENOUS | Status: AC
  Administered 2020-01-19: 2 g via INTRAVENOUS

## 2020-01-19 MED ORDER — CHLORHEXIDINE GLUCONATE 0.12 % MT SOLN: 15.0000 mL | Freq: Once | OROMUCOSAL | Status: DC

## 2020-01-19 MED ORDER — ONDANSETRON HCL 4 MG PO TABS
4.0000 mg | ORAL_TABLET | Freq: Four times a day (QID) | ORAL | Status: DC | PRN
  Administered 2020-01-21: 4 mg via ORAL
  Filled 2020-01-19 (×3): qty 1

## 2020-01-19 MED ORDER — METHOCARBAMOL 500 MG IVPB - SIMPLE MED
500.0000 mg | Freq: Four times a day (QID) | INTRAVENOUS | Status: DC | PRN
  Administered 2020-01-19: 500 mg via INTRAVENOUS
  Filled 2020-01-19: qty 50

## 2020-01-19 MED ORDER — 0.9 % SODIUM CHLORIDE (POUR BTL) OPTIME
TOPICAL | Status: DC | PRN
  Administered 2020-01-19: 1000 mL

## 2020-01-19 MED ORDER — LACTATED RINGERS IV BOLUS
250.0000 mL | Freq: Once | INTRAVENOUS | Status: AC
  Administered 2020-01-19: 250 mL via INTRAVENOUS

## 2020-01-19 SURGICAL SUPPLY — 53 items
BLADE HEX COATED 2.75 (ELECTRODE) ×2 IMPLANT
BLADE SAG 18X100X1.27 (BLADE) ×2 IMPLANT
BLADE SAGITTAL 25.0X1.37X90 (BLADE) ×2 IMPLANT
BLADE SURG 15 STRL LF DISP TIS (BLADE) ×1 IMPLANT
BLADE SURG 15 STRL SS (BLADE) ×1
BLADE SURG SZ10 CARB STEEL (BLADE) ×4 IMPLANT
BNDG ELASTIC 6X10 VLCR STRL LF (GAUZE/BANDAGES/DRESSINGS) ×2 IMPLANT
BNDG ELASTIC 6X15 VLCR STRL LF (GAUZE/BANDAGES/DRESSINGS) ×2 IMPLANT
BOWL SMART MIX CTS (DISPOSABLE) IMPLANT
CEMENT BONE R 1X40 (Cement) ×4 IMPLANT
CLSR STERI-STRIP ANTIMIC 1/2X4 (GAUZE/BANDAGES/DRESSINGS) ×2 IMPLANT
COMP PSN FEM CR CMT STD SZ 6L (Miscellaneous) ×2 IMPLANT
COMPONENT PSN FEM CMT STD SZ6L (Miscellaneous) ×1 IMPLANT
COVER SURGICAL LIGHT HANDLE (MISCELLANEOUS) ×2 IMPLANT
COVER WAND RF STERILE (DRAPES) IMPLANT
CUFF TOURN SGL QUICK 34 (TOURNIQUET CUFF) ×1
CUFF TRNQT CYL 34X4.125X (TOURNIQUET CUFF) ×1 IMPLANT
DECANTER SPIKE VIAL GLASS SM (MISCELLANEOUS) ×2 IMPLANT
DRAPE U-SHAPE 47X51 STRL (DRAPES) ×2 IMPLANT
DRSG MEPILEX BORDER 4X12 (GAUZE/BANDAGES/DRESSINGS) ×2 IMPLANT
DURAPREP 26ML APPLICATOR (WOUND CARE) ×4 IMPLANT
GLOVE BIO SURGEON STRL SZ7.5 (GLOVE) ×4 IMPLANT
GLOVE SRG 8 PF TXTR STRL LF DI (GLOVE) ×1 IMPLANT
GLOVE SURG UNDER POLY LF SZ7.5 (GLOVE) ×2 IMPLANT
GLOVE SURG UNDER POLY LF SZ8 (GLOVE) ×1
GOWN STRL REUS W/ TWL LRG LVL3 (GOWN DISPOSABLE) ×2 IMPLANT
GOWN STRL REUS W/TWL LRG LVL3 (GOWN DISPOSABLE) ×2
HANDPIECE INTERPULSE COAX TIP (DISPOSABLE) ×1
HOLDER FOLEY CATH W/STRAP (MISCELLANEOUS) IMPLANT
IMMOBILIZER KNEE 20 (SOFTGOODS) ×2
IMMOBILIZER KNEE 20 THIGH 36 (SOFTGOODS) ×1 IMPLANT
IMMOBILIZER KNEE 22 UNIV (SOFTGOODS) ×2 IMPLANT
KIT TURNOVER KIT A (KITS) IMPLANT
MANIFOLD NEPTUNE II (INSTRUMENTS) ×2 IMPLANT
NS IRRIG 1000ML POUR BTL (IV SOLUTION) ×2 IMPLANT
PACK ICE MAXI GEL EZY WRAP (MISCELLANEOUS) ×2 IMPLANT
PACK TOTAL KNEE CUSTOM (KITS) ×2 IMPLANT
PENCIL SMOKE EVACUATOR (MISCELLANEOUS) IMPLANT
PROTECTOR NERVE ULNAR (MISCELLANEOUS) ×2 IMPLANT
SET HNDPC FAN SPRY TIP SCT (DISPOSABLE) ×1 IMPLANT
STEM MED AS PERS SZ6-7 11 (Stem) ×2 IMPLANT
STEM POLY PAT PLY 32M KNEE (Knees) ×2 IMPLANT
STEM TIBIA 5 DEG SZ E L KNEE (Knees) ×1 IMPLANT
SUT MNCRL AB 3-0 PS2 18 (SUTURE) ×2 IMPLANT
SUT VIC AB 0 CT1 36 (SUTURE) ×2 IMPLANT
SUT VIC AB 1 CT1 36 (SUTURE) ×4 IMPLANT
SUT VIC AB 2-0 CT1 27 (SUTURE) ×1
SUT VIC AB 2-0 CT1 TAPERPNT 27 (SUTURE) ×1 IMPLANT
TIBIA STEM 5 DEG SZ E L KNEE (Knees) ×2 IMPLANT
TRAY FOLEY MTR SLVR 14FR STAT (SET/KITS/TRAYS/PACK) IMPLANT
TRAY FOLEY MTR SLVR 16FR STAT (SET/KITS/TRAYS/PACK) IMPLANT
TUBE SUCTION HIGH CAP CLEAR NV (SUCTIONS) ×2 IMPLANT
WRAP KNEE MAXI GEL POST OP (GAUZE/BANDAGES/DRESSINGS) ×2 IMPLANT

## 2020-01-19 NOTE — Op Note (Signed)
DATE OF SURGERY:  01/19/2020 TIME: 11:57 AM  PATIENT NAME:  Brenda Craig   AGE: 59 y.o.    PRE-OPERATIVE DIAGNOSIS:  OA LEFT KNEE  POST-OPERATIVE DIAGNOSIS:  Same  PROCEDURE:  Procedure(s): TOTAL KNEE ARTHROPLASTY   SURGEON:  Renette Butters, MD   ASSISTANT:  Margy Clarks, PA-C, he was present and scrubbed throughout the case, critical for completion in a timely fashion, and for retraction, instrumentation, and closure.   OPERATIVE IMPLANTS: zimmer biomet CR oxinium size 6 E tibia, 32 patella.   PREOPERATIVE INDICATIONS:  Brenda Craig is a 60 y.o. year old female with end stage bone on bone degenerative arthritis of the knee who failed conservative treatment, including injections, antiinflammatories, activity modification, and assistive devices, and had significant impairment of their activities of daily living, and elected for Total Knee Arthroplasty.   The risks, benefits, and alternatives were discussed at length including but not limited to the risks of infection, bleeding, nerve injury, stiffness, blood clots, the need for revision surgery, cardiopulmonary complications, among others, and they were willing to proceed.   OPERATIVE DESCRIPTION:  The patient was brought to the operative room and placed in a supine position.  General anesthesia was administered.  IV antibiotics were given.  The lower extremity was prepped and draped in the usual sterile fashion.  Time out was performed.  The leg was elevated and exsanguinated and the tourniquet was inflated.  Anterior approach was performed.  The patella was everted and osteophytes were removed.  The anterior horn of the medial and lateral meniscus was removed.   The distal femur was opened with the drill and the intramedullary distal femoral cutting jig was utilized, set at 5 degrees resecting 10 mm off the distal femur.  Care was taken to protect the collateral ligaments.  The distal femoral sizing jig was  applied, taking care to avoid notching.  Then the 4-in-1 cutting jig was applied and the anterior and posterior femur was cut, along with the chamfer cuts.  All posterior osteophytes were removed.  The flexion gap was then measured and was symmetric with the extension gap.  Then the extramedullary tibial cutting jig was utilized making the appropriate cut using the anterior tibial crest as a reference building in appropriate posterior slope.  Care was taken during the cut to protect the medial and collateral ligaments.  The proximal tibia was removed along with the posterior horns of the menisci.  The PCL was sacrificed.    The extensor gap was measured and was approximately 1m.    I completed the distal femoral preparation using the appropriate jig to prepare the box.  The patella was then measured, and cut with the saw.    The proximal tibia sized and prepared accordingly with the reamer and the punch, and then all components were trialed with the 164mpoly insert.  The knee was found to have excellent balance and full motion.    The above named components were then cemented into place and all excess cement was removed. Poly tibial piece and patella were inserted.  I was very happy with his stability and ROM  I performed a periarticular injection with marcaine and toradol  The knee was easily taken through a range of motion and the patella tracked well and the knee irrigated copiously and the parapatellar and subcutaneous tissue closed with vicryl, and monocryl with steri strips for the skin.  The incision was dressed with sterile gauze and the tourniquet released and the  patient was awakened and returned to the PACU in stable and satisfactory condition.  There were no complications.  Total tourniquet time was 65 minutes.   POSTOPERATIVE PLAN: post op Abx, DVT px: SCD's, TED's, Early ambulation and chemical px

## 2020-01-19 NOTE — Anesthesia Procedure Notes (Signed)
Spinal  Patient location during procedure: OR Start time: 01/19/2020 10:30 AM End time: 01/19/2020 10:35 AM Staffing Anesthesiologist: Janeece Riggers, MD Preanesthetic Checklist Completed: patient identified, IV checked, site marked, risks and benefits discussed, surgical consent, monitors and equipment checked, pre-op evaluation and timeout performed Spinal Block Patient position: sitting Prep: DuraPrep Patient monitoring: heart rate, cardiac monitor, continuous pulse ox and blood pressure Approach: midline Location: L3-4 Injection technique: single-shot Needle Needle type: Sprotte  Needle gauge: 24 G Needle length: 9 cm Assessment Sensory level: T4

## 2020-01-19 NOTE — Anesthesia Postprocedure Evaluation (Signed)
Anesthesia Post Note  Patient: AZAYLIA FONG  Procedure(s) Performed: TOTAL KNEE ARTHROPLASTY (Left Knee)     Patient location during evaluation: PACU Anesthesia Type: Spinal Level of consciousness: oriented and awake and alert Pain management: pain level controlled Vital Signs Assessment: post-procedure vital signs reviewed and stable Respiratory status: spontaneous breathing, respiratory function stable and patient connected to nasal cannula oxygen Cardiovascular status: blood pressure returned to baseline and stable Postop Assessment: no headache, no backache and no apparent nausea or vomiting Anesthetic complications: no   No complications documented.  Last Vitals:  Vitals:   01/19/20 0932 01/19/20 0933  BP:    Pulse: 76 73  Resp: (!) 23 13  Temp:    SpO2: 95% 97%    Last Pain:  Vitals:   01/19/20 0929  TempSrc:   PainSc: 0-No pain                 Rehaan Viloria

## 2020-01-19 NOTE — Transfer of Care (Signed)
Immediate Anesthesia Transfer of Care Note  Patient: Brenda Craig  Procedure(s) Performed: Procedure(s): TOTAL KNEE ARTHROPLASTY (Left)  Patient Location: PACU  Anesthesia Type:Spinal  Level of Consciousness: awake, alert  and oriented  Airway & Oxygen Therapy: Patient Spontanous Breathing  Post-op Assessment: Report given to RN and Post -op Vital signs reviewed and stable  Post vital signs: Reviewed and stable  Last Vitals:  Vitals:   01/19/20 0932 01/19/20 0933  BP:    Pulse: 76 73  Resp: (!) 23 13  Temp:    SpO2: 44% 83%    Complications: No apparent anesthesia complications

## 2020-01-19 NOTE — Interval H&P Note (Signed)
History and Physical Interval Note:  01/19/2020 8:52 AM  Brenda Craig  has presented today for surgery, with the diagnosis of OA LEFT KNEE.  The various methods of treatment have been discussed with the patient and family. After consideration of risks, benefits and other options for treatment, the patient has consented to  Procedure(s): TOTAL KNEE ARTHROPLASTY (Left) as a surgical intervention.  The patient's history has been reviewed, patient examined, no change in status, stable for surgery.  I have reviewed the patient's chart and labs.  Questions were answered to the patient's satisfaction.     Renette Butters

## 2020-01-19 NOTE — Addendum Note (Signed)
Addendum  created 01/19/20 1528 by Janeece Riggers, MD   Order list changed, Order sets accessed, Pharmacy for encounter modified

## 2020-01-19 NOTE — Anesthesia Procedure Notes (Signed)
Anesthesia Regional Block: Adductor canal block   Pre-Anesthetic Checklist: ,, timeout performed, Correct Patient, Correct Site, Correct Laterality, Correct Procedure, Correct Position, site marked, Risks and benefits discussed,  Surgical consent,  Pre-op evaluation,  At surgeon's request and post-op pain management  Laterality: Left  Prep: chloraprep       Needles:  Injection technique: Single-shot  Needle Type: Echogenic Stimulator Needle     Needle Length: 5cm  Needle Gauge: 22     Additional Needles:   Procedures:, nerve stimulator,,, ultrasound used (permanent image in chart),,,,  Narrative:  Start time: 01/19/2020 9:22 AM End time: 01/19/2020 9:27 AM Injection made incrementally with aspirations every 5 mL.  Performed by: Personally  Anesthesiologist: Janeece Riggers, MD  Additional Notes: Functioning IV was confirmed and monitors were applied.  A 65m 22ga Arrow echogenic stimulator needle was used. Sterile prep and drape,hand hygiene and sterile gloves were used. Ultrasound guidance: relevant anatomy identified, needle position confirmed, local anesthetic spread visualized around nerve(s)., vascular puncture avoided.  Image printed for medical record. Negative aspiration and negative test dose prior to incremental administration of local anesthetic. The patient tolerated the procedure well.

## 2020-01-19 NOTE — Evaluation (Signed)
Physical Therapy Evaluation Patient Details Name: Brenda Craig MRN: [id number] DOB: 1960/03/18 Today's Date: 01/19/2020   History of Present Illness  Patient is 60 y.o. female s/p Lt TKA on 01/19/20 with PMH significant for MI, HTN, CAD, CHF, celiac, asthma, OA, anemia, cardiac cath & stent in 2016.  Clinical Impression   Pt is s/p Lt TKA POD 0. Pt tolerated treatment well but was limited by pain. Pt was independent prior to admission and has intermittent help from her daughter at home. Pt demonstrated decreased strength of Lt LE 2/2 pain. Patient required MIN assist for bed to chair transfer with RW, unable to advance 2/2 pain. Pt will benefit from continued skilled PT to increase independence and safety with mobility to allow discharge to the venue listed below.      Follow Up Recommendations Outpatient PT;Follow surgeon's recommendation for DC plan and follow-up therapies    Equipment Recommendations  None recommended by PT    Recommendations for Other Services       Precautions / Restrictions Precautions Precautions: Fall Restrictions Weight Bearing Restrictions: No Other Position/Activity Restrictions: WBAT      Mobility  Bed Mobility Overal bed mobility: Needs Assistance Bed Mobility: Supine to Sit     Supine to sit: Min assist     General bed mobility comments: Patient required MIN assist for progression of L LE and verbal cues for use of UEs on bedrail for supine to EOB transfer    Transfers Overall transfer level: Needs assistance Equipment used: Rolling walker (2 wheeled) Transfers: Sit to/from Stand Sit to Stand: Min assist         General transfer comment: Patient required verbal cues for one hand on RW and one hand on bed to perform sit to stand transfer with MIN A for stability.  Ambulation/Gait Ambulation/Gait assistance: Min assist Gait Distance (Feet): 3 Feet Assistive device: Rolling walker (2 wheeled) Gait Pattern/deviations: Step-to  pattern;Antalgic;Decreased weight shift to left Gait velocity: decreased   General Gait Details: Patient demonstrated decreased WB on L LE 2/2 pain and required verbal cues for technique (L foot first) assist needed for walker positioning to step bed to chair.  Stairs            Wheelchair Mobility    Modified Rankin (Stroke Patients Only)       Balance Overall balance assessment: Needs assistance Sitting-balance support: No upper extremity supported Sitting balance-Leahy Scale: Good     Standing balance support: Bilateral upper extremity supported Standing balance-Leahy Scale: Poor                               Pertinent Vitals/Pain Pain Assessment: 0-10 Pain Score: 7  Pain Location: Lt knee Pain Descriptors / Indicators: Aching;Guarding;Sore Pain Intervention(s): Limited activity within patient's tolerance;Monitored during session;RN gave pain meds during session;Ice applied    Home Living Family/patient expects to be discharged to:: Private residence Living Arrangements: Children;Parent Available Help at Discharge: Family Type of Home: House Home Access: Stairs to enter Entrance Stairs-Rails: Can reach both Entrance Stairs-Number of Steps: 5 Home Layout: One level Home Equipment: Fellows - 2 wheels;Cane - single point;Shower seat;Bedside commode Additional Comments: pt's daughter can help her some. she is staying in her guest room because the bed is a lower height.    Prior Function Level of Independence: Independent               Hand Dominance   Dominant Hand:  Right    Extremity/Trunk Assessment   Upper Extremity Assessment Upper Extremity Assessment: Overall WFL for tasks assessed    Lower Extremity Assessment Lower Extremity Assessment: LLE deficits/detail LLE Deficits / Details: decreased strength. Pt was unable to complete SLR 2/2 pain. LLE: Unable to fully assess due to pain;Unable to fully assess due to immobilization LLE  Sensation: WNL    Cervical / Trunk Assessment Cervical / Trunk Assessment: Normal  Communication   Communication: No difficulties  Cognition Arousal/Alertness: Awake/alert Behavior During Therapy: WFL for tasks assessed/performed Overall Cognitive Status: Within Functional Limits for tasks assessed                                        General Comments      Exercises Total Joint Exercises Ankle Circles/Pumps: AROM;Both;20 reps;Seated   Assessment/Plan    PT Assessment Patient needs continued PT services  PT Problem List Decreased strength;Decreased activity tolerance;Decreased balance;Decreased mobility;Decreased coordination;Pain;Decreased range of motion       PT Treatment Interventions DME instruction;Gait training;Stair training;Therapeutic activities;Functional mobility training;Therapeutic exercise;Balance training;Neuromuscular re-education;Patient/family education    PT Goals (Current goals can be found in the Care Plan section)  Acute Rehab PT Goals Patient Stated Goal: Patient wants to go home and become more independent PT Goal Formulation: With patient Time For Goal Achievement: 01/26/20 Potential to Achieve Goals: Good    Frequency 7X/week   Barriers to discharge        Co-evaluation               AM-PAC PT "6 Clicks" Mobility  Outcome Measure Help needed turning from your back to your side while in a flat bed without using bedrails?: None Help needed moving from lying on your back to sitting on the side of a flat bed without using bedrails?: A Little Help needed moving to and from a bed to a chair (including a wheelchair)?: A Little Help needed standing up from a chair using your arms (e.g., wheelchair or bedside chair)?: A Little Help needed to walk in hospital room?: A Little Help needed climbing 3-5 steps with a railing? : A Lot 6 Click Score: 18    End of Session Equipment Utilized During Treatment: Gait belt;Left knee  immobilizer Activity Tolerance: Patient tolerated treatment well;Patient limited by pain Patient left: in chair;with call bell/phone within reach;with chair alarm set Nurse Communication: Mobility status;Patient requests pain meds (Nursing notified of assist level to return patient to bed and that patient was left with ice to be removed and knee immobilizer to be worn prior to mobility.) PT Visit Diagnosis: Unsteadiness on feet (R26.81);Muscle weakness (generalized) (M62.81);Pain Pain - Right/Left: Left Pain - part of body: Knee    Time: 3382-5053 PT Time Calculation (min) (ACUTE ONLY): 37 min   Charges:   PT Evaluation $PT Eval Low Complexity: 1 Low PT Treatments $Therapeutic Activity: 8-22 mins        Danecia Underdown, SPT  Acute rehab    Atiyah Bauer 01/19/2020, 7:25 PM

## 2020-01-19 NOTE — Progress Notes (Signed)
Pt O2 sats continue to fluctuate between 87-92. Attempted multiple sites to obtain accurate reading. Pt is pending discharge home, however she states she does not have much support at home to recover. Pt states she lives with her disabled teenage daughter and her mother is elderly. Dr. Percell Miller consulted about pt wishes to stay overnight. Pt made aware she would likely not get a room but would be staying over in recovery area. She was OK with this. Dr. Percell Miller asked to have anesthesia evaluate her. Dr. Ambrose Pancoast called and at bedside. He states due to her O2 levels she should be admitted. Dr. Percell Miller aware, awaiting admit orders.

## 2020-01-19 NOTE — Progress Notes (Signed)
Assisted Dr. Ambrose Pancoast with left, ultrasound guided, adductor canal block. Side rails up, monitors on throughout procedure. See vital signs in flow sheet. Tolerated Procedure well.

## 2020-01-20 MED ORDER — DIPHENHYDRAMINE HCL 50 MG PO TABS: 25.0000 mg | ORAL_TABLET | Freq: Four times a day (QID) | ORAL | 0 refills | Status: DC | PRN

## 2020-01-20 MED ORDER — ACETAMINOPHEN 500 MG PO TABS: 1000.0000 mg | ORAL_TABLET | Freq: Four times a day (QID) | ORAL | 0 refills | Status: DC

## 2020-01-20 MED ORDER — ONDANSETRON HCL 4 MG PO TABS: 4.0000 mg | ORAL_TABLET | Freq: Four times a day (QID) | ORAL | 0 refills | Status: DC | PRN

## 2020-01-20 MED ORDER — LORAZEPAM 1 MG PO TABS
1.0000 mg | ORAL_TABLET | Freq: Two times a day (BID) | ORAL | Status: DC
  Administered 2020-01-20 – 2020-01-23 (×7): 1 mg via ORAL
  Filled 2020-01-20 (×7): qty 1

## 2020-01-20 MED ORDER — ASPIRIN EC 81 MG PO TBEC: 81.0000 mg | DELAYED_RELEASE_TABLET | Freq: Two times a day (BID) | ORAL | 0 refills | Status: AC

## 2020-01-20 MED ORDER — TICAGRELOR 60 MG PO TABS
60.0000 mg | ORAL_TABLET | Freq: Two times a day (BID) | ORAL | Status: DC
  Administered 2020-01-20 – 2020-01-23 (×7): 60 mg via ORAL
  Filled 2020-01-20 (×7): qty 1

## 2020-01-20 MED ORDER — OXYCODONE HCL 5 MG PO TABS: 5.0000 mg | ORAL_TABLET | Freq: Four times a day (QID) | ORAL | 0 refills | Status: AC | PRN

## 2020-01-20 MED ORDER — ONDANSETRON HCL 4 MG PO TABS: 4.0000 mg | ORAL_TABLET | Freq: Three times a day (TID) | ORAL | 1 refills | Status: DC | PRN

## 2020-01-20 MED ORDER — MONTELUKAST SODIUM 10 MG PO TABS
10.0000 mg | ORAL_TABLET | Freq: Every day | ORAL | Status: DC
  Administered 2020-01-20 – 2020-01-22 (×3): 10 mg via ORAL
  Filled 2020-01-20 (×4): qty 1

## 2020-01-20 MED ORDER — AMLODIPINE BESYLATE 5 MG PO TABS
5.0000 mg | ORAL_TABLET | Freq: Every day | ORAL | Status: DC
  Administered 2020-01-20 – 2020-01-23 (×4): 5 mg via ORAL
  Filled 2020-01-20 (×4): qty 1

## 2020-01-20 MED ORDER — FLUTICASONE FUROATE-VILANTEROL 100-25 MCG/INH IN AEPB
1.0000 | INHALATION_SPRAY | Freq: Every day | RESPIRATORY_TRACT | Status: DC
  Administered 2020-01-20 – 2020-01-23 (×4): 1 via RESPIRATORY_TRACT
  Filled 2020-01-20: qty 28

## 2020-01-20 MED ORDER — METHOCARBAMOL 500 MG PO TABS: 500.0000 mg | ORAL_TABLET | Freq: Three times a day (TID) | ORAL | 0 refills | Status: DC | PRN

## 2020-01-20 MED ORDER — PANTOPRAZOLE SODIUM 20 MG PO TBEC: 20.0000 mg | DELAYED_RELEASE_TABLET | Freq: Every day | ORAL | 0 refills | Status: DC

## 2020-01-20 MED ORDER — CELECOXIB 200 MG PO CAPS: 200.0000 mg | ORAL_CAPSULE | Freq: Two times a day (BID) | ORAL | 0 refills | Status: AC

## 2020-01-20 MED ORDER — UMECLIDINIUM BROMIDE 62.5 MCG/INH IN AEPB
1.0000 | INHALATION_SPRAY | Freq: Every day | RESPIRATORY_TRACT | Status: DC
  Administered 2020-01-20 – 2020-01-23 (×4): 1 via RESPIRATORY_TRACT
  Filled 2020-01-20: qty 7

## 2020-01-20 MED ORDER — DIPHENHYDRAMINE HCL 50 MG PO TABS: 25.0000 mg | ORAL_TABLET | Freq: Every evening | ORAL | 0 refills | Status: DC | PRN

## 2020-01-20 MED ORDER — BUDESON-GLYCOPYRROL-FORMOTEROL 160-9-4.8 MCG/ACT IN AERO: 2.0000 | INHALATION_SPRAY | Freq: Every day | RESPIRATORY_TRACT | Status: DC

## 2020-01-20 MED ORDER — FAMOTIDINE 20 MG PO TABS
40.0000 mg | ORAL_TABLET | Freq: Every day | ORAL | Status: DC
Start: 2020-01-20 — End: 2020-01-23
  Administered 2020-01-20 – 2020-01-22 (×3): 40 mg via ORAL
  Filled 2020-01-20 (×3): qty 2

## 2020-01-20 MED ORDER — HYDROCOD POLST-CPM POLST ER 10-8 MG/5ML PO SUER
5.0000 mL | Freq: Two times a day (BID) | ORAL | Status: DC | PRN
  Administered 2020-01-23: 5 mL via ORAL
  Filled 2020-01-20: qty 5

## 2020-01-20 MED ORDER — ALBUTEROL SULFATE (2.5 MG/3ML) 0.083% IN NEBU: 3.0000 mL | INHALATION_SOLUTION | RESPIRATORY_TRACT | Status: DC | PRN

## 2020-01-20 NOTE — Progress Notes (Signed)
Physical Therapy Treatment Patient Details Name: Brenda Craig MRN: [id number] DOB: 1960-02-15 Today's Date: 01/20/2020    History of Present Illness Patient is 61 y.o. female s/p Lt TKA on 01/19/20 with PMH significant for MI, HTN, CAD, CHF, celiac, asthma, OA, anemia, cardiac cath & stent in 2016.    PT Comments    POD # 1 pm session Assisted OOB to amb in hallway.  General bed mobility comments: min assist to lower LLE to the floor due to pain and increased assist needed back to bed to support LE.  General transfer comment: 25% VC's on proper hand placement and safety with turns.  Also assisted with toilet transfer. General Gait Details: 25% VC's on proper sequencing as well as proper walker to self distance esp with turns.  Tolerated an increased distance but still limited by pain/fatigue/effort Assisted back to bed and performed some TKR TE's followed by ICE. Pt has not met goals to D/C to home today due to pain control and unsafe mobility.   Follow Up Recommendations  Outpatient PT;Follow surgeon's recommendation for DC plan and follow-up therapies     Equipment Recommendations  None recommended by PT    Recommendations for Other Services       Precautions / Restrictions Precautions Precautions: Fall Precaution Comments: instructed no pillow under knee Restrictions Weight Bearing Restrictions: No LLE Weight Bearing: Weight bearing as tolerated    Mobility  Bed Mobility Overal bed mobility: Needs Assistance Bed Mobility: Supine to Sit;Sit to Supine     Supine to sit: Min assist Sit to supine: Min assist;Mod assist   General bed mobility comments: min assist to lower LLE to the floor due to pain and increased assist needed back to bed to support LE  Transfers Overall transfer level: Needs assistance Equipment used: Rolling walker (2 wheeled) Transfers: Sit to/from [facility] Sit to Stand: Min assist Stand pivot transfers: Min assist        General transfer comment: 25% VC's on proper hand placement and safety with turns.  Also assisted with toilet transfer.  Ambulation/Gait Ambulation/Gait assistance: Min guard Gait Distance (Feet): 32 Feet Assistive device: Rolling walker (2 wheeled) Gait Pattern/deviations: Step-to pattern;Antalgic;Decreased weight shift to left Gait velocity: decreased   General Gait Details: 25% VC's on proper sequencing as well as proper walker to self distance esp with turns.  Tolerated an increased distance but still limited by pain/fatigue/effort   Stairs Stairs:  (not able to attempt today)           Wheelchair Mobility    Modified Rankin (Stroke Patients Only)       Balance                                            Cognition Arousal/Alertness: Awake/alert Behavior During Therapy: WFL for tasks assessed/performed Overall Cognitive Status: Within Functional Limits for tasks assessed                                 General Comments: AxO x 3 very motivated Sales promotion account executive   Total Knee Replacement TE's following HEP handout 10 reps B LE ankle pumps 05 reps towel squeezes 05 reps knee presses 05 reps heel slides  05 reps SAQ's 05 reps SLR's 05 reps ABD Educated on use  of gait belt to assist with TE's Followed by ICE     General Comments        Pertinent Vitals/Pain Pain Assessment: 0-10 Pain Score: 8  Pain Location: Lt knee Pain Descriptors / Indicators: Aching;Guarding;Sore;Tender;Grimacing Pain Intervention(s): Monitored during session;Premedicated before session;Repositioned;Ice applied    Home Living                      Prior Function            PT Goals (current goals can now be found in the care plan section) Progress towards PT goals: Progressing toward goals    Frequency    7X/week      PT Plan Current plan remains appropriate    Co-evaluation              AM-PAC PT "6  Clicks" Mobility   Outcome Measure  Help needed turning from your back to your side while in a flat bed without using bedrails?: A Little Help needed moving from lying on your back to sitting on the side of a flat bed without using bedrails?: A Little Help needed moving to and from a bed to a chair (including a wheelchair)?: A Little Help needed standing up from a chair using your arms (e.g., wheelchair or bedside chair)?: A Little Help needed to walk in hospital room?: A Little Help needed climbing 3-5 steps with a railing? : A Lot 6 Click Score: 17    End of Session Equipment Utilized During Treatment: Gait belt Activity Tolerance: Patient limited by pain;Patient limited by fatigue Patient left: in bed;with call bell/phone within reach Nurse Communication: Mobility status PT Visit Diagnosis: Unsteadiness on feet (R26.81);Muscle weakness (generalized) (M62.81);Pain Pain - Right/Left: Left Pain - part of body: Knee     Time: 6045-4098 PT Time Calculation (min) (ACUTE ONLY): 34 min  Charges:  $Gait Training: 8-22 mins $Therapeutic Activity: 8-22 mins                     Rica Koyanagi  PTA Acute  Rehabilitation Services Pager      [phone] Office      [phone]

## 2020-01-20 NOTE — Progress Notes (Signed)
    Subjective: Patient reports pain as moderate.  Tolerating diet.  Urinating.  +Flatus.  No CP, SOB.  Note yet OOB.  Objective:   VITALS:   Vitals:   01/19/20 1725 01/19/20 1840 01/19/20 2014 01/19/20 2117  BP: 118/72 109/77  111/69  Pulse: (!) 109 98  90  Resp: 18 19 17 20   Temp:  99.4 F (37.4 C)  98.4 F (36.9 C)  TempSrc:  Oral  Oral  SpO2: 90% 94% 98% 91%  Weight:      Height:       CBC Latest Ref Rng & Units 01/15/2020 12/23/2019 12/17/2018  WBC 4.0 - 10.5 K/uL 9.2 5.9 9.8  Hemoglobin 12.0 - 15.0 g/dL 12.3 12.5 11.2(L)  Hematocrit 36.0 - 46.0 % 39.4 40.3 33.9(L)  Platelets 150 - 400 K/uL 299 300 164   BMP Latest Ref Rng & Units 01/15/2020 12/23/2019 12/26/2018  Glucose 70 - 99 mg/dL 96 99 -  BUN 6 - 20 mg/dL 17 15 -  Creatinine 0.44 - 1.00 mg/dL 1.02(H) 0.74 -  Sodium 135 - 145 mmol/L 139 139 -  Potassium 3.5 - 5.1 mmol/L 3.9 4.0 3.6  Chloride 98 - 111 mmol/L 102 105 -  CO2 22 - 32 mmol/L 24 22 -  Calcium 8.9 - 10.3 mg/dL 9.3 9.2 -   Intake/Output      01/11 0701 01/12 0700 01/12 0701 01/13 0700   P.O. 240    I.V. (mL/kg) 1750 (19)    IV Piggyback 300    Total Intake(mL/kg) 2290 (24.9)    Urine (mL/kg/hr) 3700    Blood 50    Total Output 3750    Net -1460            Physical Exam: General: NAD.  Resting in bed, comfortable Resp: No increased wob Cardio: regular rate and rhythm ABD soft Neurologically intact MSK Neurovascularly intact Sensation intact distally Intact pulses distally Dorsiflexion/Plantar flexion intact Incision: dressing C/D/I Neurovascular intact Compartment soft  Assessment: 1 Day Post-Op  S/P Procedure(s) (LRB): TOTAL KNEE ARTHROPLASTY (Left) by Dr. Ernesta Amble. Murphy on 01/19/20  Active Problems:   S/P total knee arthroplasty, left   Primary osteoarthritis, status post left knee arthroplasty   Plan: Advance diet Up with therapy D/C IV fluids Discharge home with home health Incentive Spirometry Elevate and  Apply ice CPM, bone foam  Weight Bearing: Weight Bearing as Tolerated (WBAT) LLE Dressings: Maintain Mepilex.  Please ensure thigh high TED hose are applied to operative leg prior to discharge. VTE prophylaxis: Aspirin, SCDs, ambulation Dispo: Home  With Stokes, PA-C 01/20/2020, 7:43 AM

## 2020-01-20 NOTE — Evaluation (Signed)
Occupational Therapy Evaluation Patient Details Name: Brenda Craig MRN: [id number] DOB: [birth year] Today's Date: 01/20/2020    History of Present Illness Patient is 60 y.o. female s/p Lt TKA on 01/19/20 with PMH significant for MI, HTN, CAD, CHF, celiac, asthma, OA, anemia, cardiac cath & stent in 2016.   Clinical Impression   Pt admitted with the above diagnosis and has the deficits listed below. Pt would benefit from cont OT to increase independence with basic adl transfers and LE dressing so she can d/c home safely with her daughter assisting. Pt most limited by pain this am and mild nausea making it difficult to attempt tasks without O2 and difficult to increase distance of ambulation.  Will continue to follow for adls.    Follow Up Recommendations  Follow surgeon's recommendation for DC plan and follow-up therapies;Home health OT;Supervision/Assistance - 24 hour    Equipment Recommendations  None recommended by OT    Recommendations for Other Services       Precautions / Restrictions Precautions Precautions: Fall Restrictions Weight Bearing Restrictions: Yes LLE Weight Bearing: Weight bearing as tolerated      Mobility Bed Mobility Overal bed mobility: Needs Assistance Bed Mobility: Supine to Sit     Supine to sit: Min assist     General bed mobility comments: min assist to lower LLE to the floor due to pain    Transfers Overall transfer level: Needs assistance Equipment used: Rolling walker (2 wheeled) Transfers: Sit to/from [facility] Sit to Stand: Min assist Stand pivot transfers: Min guard       General transfer comment: Cues for hand placement    Balance Overall balance assessment: Needs assistance Sitting-balance support: No upper extremity supported Sitting balance-Leahy Scale: Good     Standing balance support: Bilateral upper extremity supported Standing balance-Leahy Scale: Poor Standing balance comment: Pt must have  outside support to stand                           ADL either performed or assessed with clinical judgement   ADL Overall ADL's : Needs assistance/impaired Eating/Feeding: Set up;Sitting   Grooming: Set up;Sitting   Upper Body Bathing: Set up;Sitting   Lower Body Bathing: Moderate assistance;Sit to/from stand;Cueing for compensatory techniques Lower Body Bathing Details (indicate cue type and reason): assist to reach L foot and assist in standing to wash bottom Upper Body Dressing : Set up;Sitting   Lower Body Dressing: Moderate assistance;Cueing for compensatory techniques;Sit to/from stand Lower Body Dressing Details (indicate cue type and reason): assist with L sock and shoe and pulling pants up. Toilet Transfer: Minimal assistance;RW;BSC;Stand-pivot Armed forces technical officer Details (indicate cue type and reason): Pt was dizzy and nauseous when getting up today therefore declined walking to bathroom. Toileting- Water quality scientist and Hygiene: Min guard;Sit to/from stand       Functional mobility during ADLs: Minimal assistance General ADL Comments: Pt with difficulty reaching LLE at this time. Does not need equipment as she will have ability to do this once pain subsides.  Daughter and mother are there to assist at d/c.     Vision Baseline Vision/History: No visual deficits Patient Visual Report: No change from baseline Vision Assessment?: No apparent visual deficits     Perception Perception Perception Tested?: No   Praxis Praxis Praxis tested?: Not tested    Pertinent Vitals/Pain Pain Assessment: Faces Faces Pain Scale: Hurts whole lot Pain Location: Lt knee Pain Descriptors / Indicators: Aching;Guarding;Sore Pain Intervention(s): Limited  activity within patient's tolerance;Monitored during session;Repositioned;Premedicated before session;Ice applied     Hand Dominance Right   Extremity/Trunk Assessment Upper Extremity Assessment Upper Extremity  Assessment: Overall WFL for tasks assessed   Lower Extremity Assessment Lower Extremity Assessment: Defer to PT evaluation   Cervical / Trunk Assessment Cervical / Trunk Assessment: Normal;Other exceptions (past back surgery) Cervical / Trunk Exceptions: previous back surgery   Communication Communication Communication: No difficulties   Cognition Arousal/Alertness: Awake/alert Behavior During Therapy: WFL for tasks assessed/performed Overall Cognitive Status: Within Functional Limits for tasks assessed                                     General Comments  Pt most limited this am by pain and nausea but was mobilizing better than yesterday per chart.    Exercises     Shoulder Instructions      Home Living Family/patient expects to be discharged to:: Private residence Living Arrangements: Children;Parent Available Help at Discharge: Family Type of Home: House Home Access: Stairs to enter Technical brewer of Steps: 5 Entrance Stairs-Rails: Can reach both [place]: One level     Bathroom Shower/Tub: Occupational psychologist: Handicapped height Bathroom Accessibility: Yes   Home Equipment: Environmental consultant - 2 wheels;Cane - single point;Shower seat;Bedside commode   Additional Comments: pt's daughter can help her some. she is staying in her guest room because the bed is a lower height.      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        OT Problem List: Decreased activity tolerance;Impaired balance (sitting and/or standing);Decreased knowledge of use of DME or AE;Pain      OT Treatment/Interventions: Self-care/ADL training;DME and/or AE instruction    OT Goals(Current goals can be found in the care plan section) Acute Rehab OT Goals Patient Stated Goal: Patient wants to go home and become more independent OT Goal Formulation: With  patient Time For Goal Achievement: 02/03/20 Potential to Achieve Goals: Good ADL Goals Pt Will Perform Lower Body Bathing: with supervision;sit to/from stand Pt Will Perform Lower Body Dressing: with supervision;sit to/from stand Pt Will Perform Tub/Shower Transfer: Shower transfer;with supervision;ambulating;shower seat;rolling walker Additional ADL Goal #1: Pt will walk to bathroom with walker and complete all toileting with 3:1 over commode and supervision.  OT Frequency: Min 2X/week   Barriers to D/C:    supportive family       Co-evaluation              AM-PAC OT "6 Clicks" Daily Activity     Outcome Measure Help from another person eating meals?: None Help from another person taking care of personal grooming?: None Help from another person toileting, which includes using toliet, bedpan, or urinal?: A Little Help from another person bathing (including washing, rinsing, drying)?: A Little Help from another person to put on and taking off regular upper body clothing?: None Help from another person to put on and taking off regular lower body clothing?: A Little 6 Click Score: 21   End of Session Equipment Utilized During Treatment: Oxygen Nurse Communication: Mobility status  Activity Tolerance: Patient limited by pain Patient left: in chair;with call bell/phone within reach;with chair alarm set  OT Visit Diagnosis: Unsteadiness on feet (R26.81);Pain Pain - Right/Left: Left Pain - part of body: Leg  Time: 0071-2197 OT Time Calculation (min): 34 min Charges:  OT General Charges $OT Visit: 1 Visit OT Evaluation $OT Eval Moderate Complexity: 1 Mod OT Treatments $Self Care/Home Management : 8-22 mins  Glenford Peers 01/20/2020, 8:47 AM

## 2020-01-20 NOTE — Progress Notes (Signed)
Physical Therapy Treatment Patient Details Name: Brenda Craig MRN: [id number] DOB: 1960-01-20 Today's Date: 01/20/2020    History of Present Illness Patient is 60 y.o. female s/p Lt TKA on 01/19/20 with PMH significant for MI, HTN, CAD, CHF, celiac, asthma, OA, anemia, cardiac cath & stent in 2016.    PT Comments    POD # 1 am session Assisted OOB to amb required much effort.  General bed mobility comments: min assist to lower LLE to the floor due to pain and increased assist needed back to bed to support LE  General transfer comment: 25% VC's on proper hand placement and safety with turns  General Gait Details: 25% VC's on proper sequencing as well as proper walker to self distance esp with turns.  Tolerated an increased distance but still limited by pain/fatigue/effort.  Requested back to bed.  Unable to perform TE's due to increased pain after amb.  ICE pack applied and notified RN pt will be seen again this afternoon.    Follow Up Recommendations  Outpatient PT;Follow surgeon's recommendation for DC plan and follow-up therapies     Equipment Recommendations  None recommended by PT    Recommendations for Other Services       Precautions / Restrictions Precautions Precautions: Fall Precaution Comments: instructed no pillow under knee Restrictions Weight Bearing Restrictions: No LLE Weight Bearing: Weight bearing as tolerated    Mobility  Bed Mobility Overal bed mobility: Needs Assistance Bed Mobility: Supine to Sit;Sit to Supine     Supine to sit: Min assist Sit to supine: Min assist;Mod assist   General bed mobility comments: min assist to lower LLE to the floor due to pain and increased assist needed back to bed to support LE  Transfers Overall transfer level: Needs assistance Equipment used: Rolling walker (2 wheeled) Transfers: Sit to/from [facility] Sit to Stand: Min assist Stand pivot transfers: Min assist       General transfer  comment: 25% VC's on proper hand placement and safety with turns  Ambulation/Gait Ambulation/Gait assistance: Min guard Gait Distance (Feet): 22 Feet Assistive device: Rolling walker (2 wheeled) Gait Pattern/deviations: Step-to pattern;Antalgic;Decreased weight shift to left Gait velocity: decreased   General Gait Details: 25% VC's on proper sequencing as well as proper walker to self distance esp with turns.  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        Pertinent Vitals/Pain Pain Assessment: 0-10 Pain Score: 8  Pain Location: Lt knee Pain Descriptors / Indicators: Aching;Guarding;Sore;Tender;Grimacing Pain Intervention(s): Monitored during session;Premedicated before session;Repositioned;Ice applied    Home Living  Prior Function            PT Goals (current goals can now be found in the care plan section) Progress towards PT goals: Progressing toward goals    Frequency    7X/week      PT Plan Current plan remains appropriate    Co-evaluation              AM-PAC PT "6 Clicks" Mobility   Outcome Measure  Help needed turning from your back to your side while in a flat bed without using bedrails?: A Little Help needed moving from lying on your back to sitting on the side of a flat bed without using bedrails?: A Little Help needed moving to and from a bed to a chair (including a wheelchair)?: A Little Help needed  standing up from a chair using your arms (e.g., wheelchair or bedside chair)?: A Little Help needed to walk in hospital room?: A Little Help needed climbing 3-5 steps with a railing? : A Lot 6 Click Score: 17    End of Session Equipment Utilized During Treatment: Gait belt Activity Tolerance: Patient limited by pain;Patient limited by fatigue Patient left: in bed;with call bell/phone within reach Nurse Communication: Mobility status PT Visit Diagnosis: Unsteadiness on feet (R26.81);Muscle weakness (generalized) (M62.81);Pain Pain - Right/Left: Left Pain - part of body: Knee     Time: 1287-8676 PT Time Calculation (min) (ACUTE ONLY): 31 min  Charges:  $Gait Training: 8-22 mins $Therapeutic Activity: 8-22 mins                     Rica Koyanagi  PTA Acute  Rehabilitation Services Pager      [phone] Office      [phone]

## 2020-01-20 NOTE — TOC Transition Note (Signed)
Transition of Care [facility]) - CM/SW Discharge Note   Patient Details  Name: Brenda Craig MRN: [id number] Date of Birth: November 13, 1960  Transition of Care [facility]) CM/SW Contact:  Lennart Pall, LCSW Phone Number: 01/20/2020, 2:27 PM   Clinical Narrative:    Met briefly with pt who confirms [facility] set up for HHPT. Already has needed DME at home. NO TOC needs.   Final next level of care: [place] Barriers to Discharge: Continued Medical Work up   Patient Goals and CMS Choice Patient states their goals for this hospitalization and ongoing recovery are:: go home      Discharge Placement                       Discharge Plan and Services                DME Arranged: N/A DME Agency: NA       HH Arranged: PT HH Agency: [facility] (formerly [employer]) Date [place]:  (arranged via MD office prior to surgery)      Social Determinants of Health (SDOH) Interventions     Readmission Risk Interventions No flowsheet data found.

## 2020-01-21 ENCOUNTER — Observation Stay (HOSPITAL_COMMUNITY): Payer: BC Managed Care – PPO

## 2020-01-21 DIAGNOSIS — I10 Essential (primary) hypertension: Secondary | ICD-10-CM

## 2020-01-21 DIAGNOSIS — Z96652 Presence of left artificial knee joint: Secondary | ICD-10-CM | POA: Diagnosis not present

## 2020-01-21 DIAGNOSIS — J9601 Acute respiratory failure with hypoxia: Secondary | ICD-10-CM

## 2020-01-21 DIAGNOSIS — N179 Acute kidney failure, unspecified: Secondary | ICD-10-CM

## 2020-01-21 DIAGNOSIS — Z8709 Personal history of other diseases of the respiratory system: Secondary | ICD-10-CM

## 2020-01-21 LAB — D-DIMER, QUANTITATIVE: D-Dimer, Quant: 2.15 ug/mL-FEU — ABNORMAL HIGH (ref 0.00–0.50)

## 2020-01-21 LAB — BASIC METABOLIC PANEL
Anion gap: 12 (ref 5–15)
BUN: 14 mg/dL (ref 6–20)
CO2: 29 mmol/L (ref 22–32)
Calcium: 9.3 mg/dL (ref 8.9–10.3)
Chloride: 97 mmol/L — ABNORMAL LOW (ref 98–111)
Creatinine, Ser: 1.15 mg/dL — ABNORMAL HIGH (ref 0.44–1.00)
GFR, Estimated: 55 mL/min — ABNORMAL LOW (ref >60)
Glucose, Bld: 139 mg/dL — ABNORMAL HIGH (ref 70–99)
Potassium: 4.1 mmol/L (ref 3.5–5.1)
Sodium: 138 mmol/L (ref 135–145)

## 2020-01-21 LAB — CBC
HCT: 39.1 % (ref 36.0–46.0)
Hemoglobin: 12.1 g/dL (ref 12.0–15.0)
MCH: 27.3 pg (ref 26.0–34.0)
MCHC: 30.9 g/dL (ref 30.0–36.0)
MCV: 88.3 fL (ref 80.0–100.0)
Platelets: 250 10*3/uL (ref 150–400)
RBC: 4.43 MIL/uL (ref 3.87–5.11)
RDW: 15.8 % — ABNORMAL HIGH (ref 11.5–15.5)
WBC: 12.2 10*3/uL — ABNORMAL HIGH (ref 4.0–10.5)
nRBC: 0 % (ref 0.0–0.2)

## 2020-01-21 LAB — RESP PANEL BY RT-PCR (RSV, FLU A&B, COVID)  RVPGX2
Influenza A by PCR: NEGATIVE
Influenza B by PCR: NEGATIVE
Resp Syncytial Virus by PCR: NEGATIVE
SARS Coronavirus 2 by RT PCR: NEGATIVE

## 2020-01-21 MED ORDER — LACTATED RINGERS IV SOLN: INTRAVENOUS | Status: AC

## 2020-01-21 MED ORDER — HYDROMORPHONE HCL 2 MG PO TABS
2.0000 mg | ORAL_TABLET | ORAL | Status: DC | PRN
  Administered 2020-01-21 – 2020-01-23 (×10): 2 mg via ORAL
  Filled 2020-01-21 (×10): qty 1

## 2020-01-21 NOTE — Progress Notes (Addendum)
Physical Therapy Treatment Patient Details Name: Brenda Craig MRN: [id number] DOB: 07-12-1960 Today's Date: 01/21/2020    History of Present Illness Patient is 60 y.o. female s/p Lt TKA on 01/19/20 with PMH significant for MI, HTN, CAD, CHF, celiac, asthma, OA, anemia, cardiac cath & stent in 2016.    PT Comments    Pt reports the lymph nodes in her neck feel sore and swollen. She reports pain medication is not managing pain well. Performed TKA HEP with min assist. Noted SaO2 88% on 3L O2 at rest. With ambulation of 34' with RW SaO2 82% on room air, HR 148 max. Pt reports she is not on home O2 at baseline. Medical issues are limiting progress with mobility. Hospitalist consult recommended. Pt does put forth good effort with mobility, however she is not able to progress ambulation distance due to hypoxia/tachycardia. May need HHPT rather than OPPT due to slow progress with mobility. She is not ready to DC home from PT standpoint. Stair training was deferred this morning due to medical issues.    Follow Up Recommendations  Outpatient PT scheduled (HHPT recommended due to slow progress);Follow surgeon's recommendation for DC plan and follow-up therapies     Equipment Recommendations  None recommended by PT    Recommendations for Other Services       Precautions / Restrictions Precautions Precautions: Fall Precaution Comments: instructed no pillow under knee Restrictions Other Position/Activity Restrictions: WBAT    Mobility  Bed Mobility Overal bed mobility: Needs Assistance Bed Mobility: Supine to Sit     Supine to sit: HOB elevated;Min guard     General bed mobility comments: increased time and effort, HOB up 40*, used belt looped on L foot to self assist LLE OOB, min guard for LLE  Transfers Overall transfer level: Needs assistance Equipment used: Rolling walker (2 wheeled) Transfers: Sit to/from Stand Sit to Stand: Min guard         General transfer comment:  VCs hand placement  Ambulation/Gait Ambulation/Gait assistance: Supervision Gait Distance (Feet): 34 Feet Assistive device: Rolling walker (2 wheeled) Gait Pattern/deviations: Step-to pattern;Antalgic;Decreased weight shift to left Gait velocity: decreased   General Gait Details: SaO2 82% on room air walking, HR up to 148 max walking, distance limited by therapist 2* hypoxia/tachycardia, 8/10 pain L knee walking   Stairs             Wheelchair Mobility    Modified Rankin (Stroke Patients Only)       Balance Overall balance assessment: Needs assistance Sitting-balance support: No upper extremity supported Sitting balance-Leahy Scale: Good     Standing balance support: Bilateral upper extremity supported Standing balance-Leahy Scale: Poor Standing balance comment: Pt must have outside support to stand                            Cognition Arousal/Alertness: Awake/alert Behavior During Therapy: WFL for tasks assessed/performed Overall Cognitive Status: Within Functional Limits for tasks assessed                                 General Comments: AxO x 3, middle school Science Teacher      Exercises Total Joint Exercises Ankle Circles/Pumps: AROM;Both;20 reps;Seated Quad Sets: AROM;Left;5 reps;Supine Short Arc Quad: AAROM;Left;10 reps;Supine Heel Slides: AAROM;Left;10 reps;Supine Hip ABduction/ADduction: AAROM;Left;10 reps;Supine Straight Leg Raises: AAROM;Left;10 reps;Supine Goniometric ROM: 5-45* AAROM L knee    General Comments  Pertinent Vitals/Pain Pain Score: 8  Pain Location: Lt knee Pain Descriptors / Indicators: Aching;Guarding;Sore;Tender;Grimacing Pain Intervention(s): Limited activity within patient's tolerance;Monitored during session;Premedicated before session;Patient requesting pain meds-RN notified;RN gave pain meds during session;Ice applied    Home Living                      Prior Function             PT Goals (current goals can now be found in the care plan section) Acute Rehab PT Goals Patient Stated Goal: Patient wants to go home and become more independent PT Goal Formulation: With patient Time For Goal Achievement: 01/26/20 Potential to Achieve Goals: Good Progress towards PT goals: Not progressing toward goals - comment (hypoxia/tachycardia limiting progress)    Frequency    7X/week      PT Plan Current plan remains appropriate    Co-evaluation              AM-PAC PT "6 Clicks" Mobility   Outcome Measure  Help needed turning from your back to your side while in a flat bed without using bedrails?: A Little Help needed moving from lying on your back to sitting on the side of a flat bed without using bedrails?: A Little Help needed moving to and from a bed to a chair (including a wheelchair)?: A Little Help needed standing up from a chair using your arms (e.g., wheelchair or bedside chair)?: A Little Help needed to walk in hospital room?: A Little Help needed climbing 3-5 steps with a railing? : A Lot 6 Click Score: 17    End of Session Equipment Utilized During Treatment: Gait belt Activity Tolerance: Patient limited by pain;Patient limited by fatigue;Treatment limited secondary to medical complications (Comment) (hypoxia/tachycardia) Patient left: with call bell/phone within reach;in chair Nurse Communication: Mobility status PT Visit Diagnosis: Unsteadiness on feet (R26.81);Muscle weakness (generalized) (M62.81);Pain Pain - Right/Left: Left Pain - part of body: Knee     Time: 7588-3254 PT Time Calculation (min) (ACUTE ONLY): 42 min  Charges:  $Gait Training: 8-22 mins $Therapeutic Exercise: 8-22 mins $Therapeutic Activity: 8-22 mins                     Blondell Reveal Kistler PT 01/21/2020  Acute Rehabilitation Services Pager [phone] Office [phone]

## 2020-01-21 NOTE — Progress Notes (Signed)
Called by RN about pt. SpO2 and HR. Called hospitalitis service consult Dr. Neysa Bonito.  Awaiting call back.

## 2020-01-21 NOTE — Progress Notes (Signed)
Patient's O2 saturation dropped to 82% on room air and Heart rate increased to 148 while ambulating. Patient is now a yellow MEWS called MD awaiting response.

## 2020-01-21 NOTE — Progress Notes (Signed)
PT Cancellation Note  Patient Details Name: Brenda Craig MRN: [id number] DOB: 1960/05/03   Cancelled Treatment:    Reason Eval/Treat Not Completed: Fatigue/lethargy limiting ability to participate (pt resting in bed, she stated she is very fatigued from not sleeping last night and wants to sleep now. Will follow.)   Philomena Doheny PT 01/21/2020  Acute Rehabilitation Services Pager [phone] Office [phone]

## 2020-01-21 NOTE — Progress Notes (Signed)
OT Cancellation Note  Patient Details Name: Brenda Craig MRN: [id number] DOB: July 03, 1960   Cancelled Treatment:    Reason Eval/Treat Not Completed: Medical issues which prohibited therapy. Elevated HR and shortness of breath. Will f/u tomorrow.  Vonda L Carmack 01/21/2020, 2:44 PM

## 2020-01-21 NOTE — Consult Note (Addendum)
Medical Consultation   Brenda Craig  HEN:[id number]  DOB: 1960-11-16  DOA: 01/19/2020  PCP: Albina Billet, MD    Requesting physician: Dr. Percell Miller  Reason for consultation: Hypoxia   History of Present Illness: This is a 60 y.o. female with a past medical history of chronic diastolic heart failure, cardiac arrest, CAD, hypertension, osteoarthritis hyperlipidemia, asthma, celiac disease who is currently admitted under the orthopedic surgery service after undergoing an elective total left knee arthroplasty on 01/19/2020 with Dr. Percell Miller.  She started having some shortness of breath overnight on 1/12 and patient was noted to have desaturation to 82% on room air with increased heart rate to 148 while ambulating with PT on 1/13.  This prompted TRH consult.  Currently, patient states that she had diarrhea x1 several days ago but believe this to be secondary to an Ensure drink.  States that she has since developed generalized myalgias over the past 2 days or so as well as tenderness of her neck lymph nodes.  Denies any wheezing or chest pain.  States that she did have her COVID-19 vaccination and booster most recently in December but has not received her flu vaccine.  Otherwise no other complaints.  Review of Systems:  Review of Systems  All other systems reviewed and are negative.  As per HPI otherwise 10 point review of systems negative.     Past Medical History: Past Medical History:  Diagnosis Date  . Abnormal Pap smear of cervix 1988  . Acid reflux 01/17/2013  . Allergic rhinitis   . Anemia   . Anginal pain ([place])   . Anxiety   . Arthritis   . Asthma   . Celiac disease   . Cervical dysplasia 1988  . CHF (congestive heart failure) ([place])   . Complication of anesthesia    bp drops after anesthesia and has to be kept overnight  . Coronary artery disease   . Heart trouble   . History of cardiac arrest   . History of kidney stones   . History of mammogram  03/25/2013; 12/07/14   BIRADS 1; NEG  . History of Papanicolaou smear of cervix 03/20/12; 12/28/15   -/-; -/-  . Hypercholesteremia   . Hypertension   . Menopausal symptoms   . MI (myocardial infarction) ([place])   . Mitral valve prolapse   . Pneumonia   . PONV (postoperative nausea and vomiting)    nausea only  . Sleep apnea   . Vulvovaginitis    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    Past Surgical History: Past Surgical History:  Procedure Laterality Date  . ABLATION  2013   [facility]  . BACK SURGERY    . BACK SURGERY  2016; 2017   L4, L5  . CARDIAC SURGERY  03/19/12; 10/2014   HEART CATH AND STENT  . [place]; 1989  . COLD KNIFE CONE BIOPSY  1988  . COLONOSCOPY  08/2010   16 POLYPS (ALL BENIGN) DX C CELIAC DISEASE  . COMBINED HYSTEROSCOPY DIAGNOSTIC / D&C  2013   [facility]  . CYSTOSCOPY W/ RETROGRADES Left 01/20/2019   Procedure: CYSTOSCOPY WITH RETROGRADE PYELOGRAM;  Surgeon: Abbie Sons, MD;  Location: [facility];  Service: Urology;  Laterality: Left;  . CYSTOSCOPY W/ URETERAL STENT PLACEMENT Left 12/13/2018   Procedure: CYSTOSCOPY WITH RETROGRADE PYELOGRAM/URETERAL STENT PLACEMENT;  Surgeon: Irine Seal,  MD;  Location: [facility];  Service: Urology;  Laterality: Left;  . CYSTOSCOPY/URETEROSCOPY/HOLMIUM LASER/STENT PLACEMENT Left 01/20/2019   Procedure: CYSTOSCOPY/URETEROSCOPY/HOLMIUM LASER/STENT EXCHANGE;  Surgeon: Abbie Sons, MD;  Location: [facility] OR[facility];  Service: Urology;  Laterality: Left;  . DILATION AND CURETTAGE OF UTERUS  2001  . ESOPHAGOGASTRODUODENOSCOPY  08/2010   DX C CELIAC DISEASE  . HEART STENTS  05/27/2011  . HIP SURGERY  2016   RIGHT IT BAND AND BURSA REMOVAL  . OOPHORECTOMY Right 1982     Allergies:   Allergies  Allergen Reactions  . Other     Gluten allergy  . Codeine Nausea And Vomiting and Rash  . Nexium [Esomeprazole Magnesium] Palpitations  . Omeprazole Magnesium Palpitations  . Oxycodone-Acetaminophen Itching,  Nausea And Vomiting and Rash  . Percocet [Oxycodone-Acetaminophen] Itching, Nausea And Vomiting and Rash    Can take with benadryl   . Tramadol Rash    Can take with benadryl      Social History:  reports that she has never smoked. She has never used smokeless tobacco. She reports that she does not drink alcohol and does not use drugs.   Family History: Family History  Problem Relation Age of Onset  . CAD Father   . Transient ischemic attack Father   . Diabetes Father   . Cerebrovascular Accident Father   . Heart disease Father        M-MVP; F BETA;BLOCKERS  . Hypothyroidism Father   . Hyperthyroidism Mother   . Cancer Maternal Uncle        KIDNEY  . Uterine cancer Maternal Aunt 69     Physical Exam: Vitals:   01/21/20 0851 01/21/20 0855 01/21/20 0954 01/21/20 1200  BP:   (!) 120/105 114/76  Pulse:   (!) 148 (!) 101  Resp:    18  Temp:    98.7 F (37.1 C)  TempSrc:    Oral  SpO2: (!) 88% (!) 88% (!) 82% 91%  Weight:      Height:        Physical Exam Vitals and nursing note reviewed.  Constitutional:      Appearance: Normal appearance.  HENT:     Head: Normocephalic and atraumatic.  Eyes:     Conjunctiva/sclera: Conjunctivae normal.  Cardiovascular:     Rate and Rhythm: Normal rate and regular rhythm.  Pulmonary:     Effort: Pulmonary effort is normal.     Breath sounds: Rales present.  Abdominal:     General: Abdomen is flat.     Palpations: Abdomen is soft.  Musculoskeletal:        General: No swelling or tenderness.  Lymphadenopathy:     Comments: Tender right submandibular and bilateral cervical lymphadenopathy  Skin:    Coloration: Skin is not jaundiced or pale.  Neurological:     Mental Status: She is alert. Mental status is at baseline.  Psychiatric:        Mood and Affect: Mood normal.        Behavior: Behavior normal.       Data reviewed:  I have personally reviewed following labs and imaging studies Labs:  CBC: Recent Labs  Lab  01/15/20 1547 01/21/20 1255  WBC 9.2 12.2*  HGB 12.3 12.1  HCT 39.4 39.1  MCV 87.0 88.3  PLT 299 [id number]    Basic Metabolic Panel: Recent Labs  Lab 01/15/20 1547 01/21/20 1255  NA 139 138  K 3.9 4.1  CL 102 97*  CO2 24 29  GLUCOSE 96 139*  BUN 17 14  CREATININE 1.02* 1.15*  CALCIUM 9.3 9.3   GFR Estimated Creatinine Clearance: 58.3 mL/min (A) (by C-G formula based on SCr of 1.15 mg/dL (H)). Liver Function Tests: No results for input(s): AST, ALT, ALKPHOS, BILITOT, PROT, ALBUMIN in the last 168 hours. No results for input(s): LIPASE, AMYLASE in the last 168 hours. No results for input(s): AMMONIA in the last 168 hours. Coagulation profile No results for input(s): INR, PROTIME in the last 168 hours.  Cardiac Enzymes: No results for input(s): CKTOTAL, CKMB, CKMBINDEX, TROPONINI in the last 168 hours. BNP: Invalid input(s): POCBNP CBG: No results for input(s): GLUCAP in the last 168 hours. D-Dimer No results for input(s): DDIMER in the last 72 hours. Hgb A1c No results for input(s): HGBA1C in the last 72 hours. Lipid Profile No results for input(s): CHOL, HDL, LDLCALC, TRIG, CHOLHDL, LDLDIRECT in the last 72 hours. Thyroid function studies No results for input(s): TSH, T4TOTAL, T3FREE, THYROIDAB in the last 72 hours.  Invalid input(s): FREET3 Anemia work up No results for input(s): VITAMINB12, FOLATE, FERRITIN, TIBC, IRON, RETICCTPCT in the last 72 hours. Urinalysis    Component Value Date/Time   COLORURINE YELLOW (A) 12/13/2018 1933   APPEARANCEUR Cloudy (A) 02/10/2019 0923   LABSPEC 1.011 12/13/2018 1933   PHURINE 5.0 12/13/2018 1933   GLUCOSEU Negative 02/10/2019 0923   HGBUR LARGE (A) 12/13/2018 1933   BILIRUBINUR Negative 02/10/2019 0923   KETONESUR 5 (A) 12/13/2018 1933   PROTEINUR 1+ (A) 02/10/2019 0923   PROTEINUR 100 (A) 12/13/2018 1933   NITRITE Negative 02/10/2019 0923   NITRITE NEGATIVE 12/13/2018 1933   LEUKOCYTESUR 2+ (A) 02/10/2019 0923    LEUKOCYTESUR TRACE (A) 12/13/2018 1933     Microbiology Recent Results (from the past 240 hour(s))  SARS CORONAVIRUS 2 (TAT 6-24 HRS) Nasopharyngeal Nasopharyngeal Swab     Status: None   Collection Time: 01/15/20  3:17 PM   Specimen: Nasopharyngeal Swab  Result Value Ref Range Status   SARS Coronavirus 2 NEGATIVE NEGATIVE Final    Comment: (NOTE) SARS-CoV-2 target nucleic acids are NOT DETECTED.  The SARS-CoV-2 RNA is generally detectable in upper and lower respiratory specimens during the acute phase of infection. Negative results do not preclude SARS-CoV-2 infection, do not rule out co-infections with other pathogens, and should not be used as the sole basis for treatment or other patient management decisions. Negative results must be combined with clinical observations, patient history, and epidemiological information. The expected result is Negative.  Fact Sheet for Patients: SugarRoll.be  Fact Sheet for Healthcare Providers: https://www.woods-mathews.com/  This test is not yet approved or cleared by the [place] FDA and  has been authorized for detection and/or diagnosis of SARS-CoV-2 by FDA under an Emergency Use Authorization (EUA). This EUA will remain  in effect (meaning this test can be used) for the duration of the COVID-19 declaration under Se ction 564(b)(1) of the Act, 21 U.S.C. section 360bbb-3(b)(1), unless the authorization is terminated or revoked sooner.  Performed at [facility], [place] [address] [place], [place] [address]   Surgical pcr screen     Status: None   Collection Time: 01/15/20  3:44 PM   Specimen: Nasal Mucosa; Nasal Swab  Result Value Ref Range Status   MRSA, PCR NEGATIVE NEGATIVE Final   Staphylococcus aureus NEGATIVE NEGATIVE Final    Comment: (NOTE) The Xpert SA Assay (FDA approved for NASAL specimens in patients 33 years of age and older), is one component  of a  comprehensive surveillance program. It is not intended to diagnose infection nor to guide or monitor treatment. Performed at [facility], [place] [address] [place], [place] [address]   Resp panel by RT-PCR (RSV, Flu A&B, Covid) Nasopharyngeal Swab     Status: None   Collection Time: 01/21/20  1:07 PM   Specimen: Nasopharyngeal Swab; Nasopharyngeal(NP) swabs in vial transport medium  Result Value Ref Range Status   SARS Coronavirus 2 by RT PCR NEGATIVE NEGATIVE Final    Comment: (NOTE) SARS-CoV-2 target nucleic acids are NOT DETECTED.  The SARS-CoV-2 RNA is generally detectable in upper respiratory specimens during the acute phase of infection. The lowest concentration of SARS-CoV-2 viral copies this assay can detect is 138 copies/mL. A negative result does not preclude SARS-Cov-2 infection and should not be used as the sole basis for treatment or other patient management decisions. A negative result may occur with  improper specimen collection/handling, submission of specimen other than nasopharyngeal swab, presence of viral mutation(s) within the areas targeted by this assay, and inadequate number of viral copies(<138 copies/mL). A negative result must be combined with clinical observations, patient history, and epidemiological information. The expected result is Negative.  Fact Sheet for Patients:  EntrepreneurPulse.com.au  Fact Sheet for Healthcare Providers:  IncredibleEmployment.be  This test is no t yet approved or cleared by the [place] FDA and  has been authorized for detection and/or diagnosis of SARS-CoV-2 by FDA under an Emergency Use Authorization (EUA). This EUA will remain  in effect (meaning this test can be used) for the duration of the COVID-19 declaration under Section 564(b)(1) of the Act, 21 U.S.C.section 360bbb-3(b)(1), unless the authorization is terminated  or revoked sooner.       Influenza A  by PCR NEGATIVE NEGATIVE Final   Influenza B by PCR NEGATIVE NEGATIVE Final    Comment: (NOTE) The Xpert Xpress SARS-CoV-2/FLU/RSV plus assay is intended as an aid in the diagnosis of influenza from Nasopharyngeal swab specimens and should not be used as a sole basis for treatment. Nasal washings and aspirates are unacceptable for Xpert Xpress SARS-CoV-2/FLU/RSV testing.  Fact Sheet for Patients: EntrepreneurPulse.com.au  Fact Sheet for Healthcare Providers: IncredibleEmployment.be  This test is not yet approved or cleared by the [place] FDA and has been authorized for detection and/or diagnosis of SARS-CoV-2 by FDA under an Emergency Use Authorization (EUA). This EUA will remain in effect (meaning this test can be used) for the duration of the COVID-19 declaration under Section 564(b)(1) of the Act, 21 U.S.C. section 360bbb-3(b)(1), unless the authorization is terminated or revoked.     Resp Syncytial Virus by PCR NEGATIVE NEGATIVE Final    Comment: (NOTE) Fact Sheet for Patients: EntrepreneurPulse.com.au  Fact Sheet for Healthcare Providers: IncredibleEmployment.be  This test is not yet approved or cleared by the [place] FDA and has been authorized for detection and/or diagnosis of SARS-CoV-2 by FDA under an Emergency Use Authorization (EUA). This EUA will remain in effect (meaning this test can be used) for the duration of the COVID-19 declaration under Section 564(b)(1) of the Act, 21 U.S.C. section 360bbb-3(b)(1), unless the authorization is terminated or revoked.  Performed at [facility], [place] [address] [place], [place] [address]        Inpatient Medications:   Scheduled Meds: . amLODipine  5 mg Oral Daily  . aspirin  81 mg Oral BID  . celecoxib  200 mg Oral BID  . docusate sodium  100 mg Oral  BID  . famotidine  40 mg Oral QHS  . fluticasone  furoate-vilanterol  1 puff Inhalation Daily   And  . umeclidinium bromide  1 puff Inhalation Daily  . LORazepam  1 mg Oral BID  . montelukast  10 mg Oral QHS  . pantoprazole  40 mg Oral Daily  . ticagrelor  60 mg Oral BID   Continuous Infusions: . lactated ringers    . methocarbamol (ROBAXIN) IV 500 mg (01/19/20 1407)     Radiological Exams on Admission: DG CHEST PORT 1 VIEW  Result Date: 01/21/2020 CLINICAL DATA:  Hypoxia, trouble breathing since surgery on 01/19/2020 history asthma EXAM: PORTABLE CHEST 1 VIEW COMPARISON:  Portable exam 1217 hours compared to 02/23/2019 FINDINGS: Normal heart size, mediastinal contours, and pulmonary vascularity. Bibasilar atelectasis. Lungs otherwise clear. No pulmonary infiltrate, pleural effusion, or pneumothorax. IMPRESSION: Bibasilar atelectasis. Electronically Signed   By: Lavonia Dana M.D.   On: 01/21/2020 12:44    Impression/Recommendations Active Problems:   Essential hypertension   AKI (acute kidney injury) ([place])   S/P total knee arthroplasty, left   Acute respiratory failure with hypoxia ([place])    1. Acute hypoxic respiratory failure likely secondary to atelectasis a. Afebrile, SPO2 dropped to 79% on room air, currently requiring 4 L/min to maintain low 90s b. CXR with atelectasis, rales on exam c. Given diarrhea, myalgias and lymphadenopathy and no history of flu vaccine, will obtain flu and RSV swab d. Instructed proper incentive spirometry technique e. Out of bed as tolerated f. D dimer i. Addendum: D Dimer elevated, VQ scan ordered due to AKI g. Check CBC h. Continue Breo Ellipta and Singulair i. Titrate to room air as able  2. AKI a. Creatinine 1.15, baseline 0.7 b. Hold NSAIDs and nephrotoxic agents c. Gentle IV hydration  3. Asthma, not in exacerbation a. Continue inhalers  4. Osteoarthritis s/p left total knee arthroplasty 01/19/2020 with Dr. Percell Miller, POD 2 a. Per primary team  5. Hypertension a. Continue  amlodipine   Thank you for this consultation.  Our [facility] hospitalist team will follow the patient with you.   Time Spent: 60 minutes  Harold Hedge D.O.  [facility] 01/21/2020, 3:24 PM

## 2020-01-21 NOTE — Progress Notes (Signed)
    Subjective: Patient reports pain as severe.  Tolerating diet.  Urinating.  +Flatus.  No CP, SOB.  OOB with PT/OT. +nausea   Objective:   VITALS:   Vitals:   01/20/20 0949 01/20/20 1358 01/20/20 2109 01/21/20 0534  BP: 123/75 102/65 133/77 121/74  Pulse: 76 89 95 96  Resp: 18  16 16   Temp: 97.9 F (36.6 C) 98.1 F (36.7 C) 98.1 F (36.7 C)   TempSrc: Oral Oral Oral   SpO2: 93% 92% (!) 88% (!) 79%  Weight:      Height:       CBC Latest Ref Rng & Units 01/15/2020 12/23/2019 12/17/2018  WBC 4.0 - 10.5 K/uL 9.2 5.9 9.8  Hemoglobin 12.0 - 15.0 g/dL 12.3 12.5 11.2(L)  Hematocrit 36.0 - 46.0 % 39.4 40.3 33.9(L)  Platelets 150 - 400 K/uL 299 300 164   BMP Latest Ref Rng & Units 01/15/2020 12/23/2019 12/26/2018  Glucose 70 - 99 mg/dL 96 99 -  BUN 6 - 20 mg/dL 17 15 -  Creatinine 0.44 - 1.00 mg/dL 1.02(H) 0.74 -  Sodium 135 - 145 mmol/L 139 139 -  Potassium 3.5 - 5.1 mmol/L 3.9 4.0 3.6  Chloride 98 - 111 mmol/L 102 105 -  CO2 22 - 32 mmol/L 24 22 -  Calcium 8.9 - 10.3 mg/dL 9.3 9.2 -   Intake/Output      01/12 0701 01/13 0700 01/13 0701 01/14 0700   P.O. 1560    I.V. (mL/kg) 0 (0)    IV Piggyback 50    Total Intake(mL/kg) 1610 (17.5)    Urine (mL/kg/hr) 200 (0.1)    Stool 0    Blood     Total Output 200    Net +1410         Urine Occurrence 7 x    Stool Occurrence 1 x       Physical Exam: General: NAD.  Resting in bed, comfortable Resp: No increased wob Cardio: regular rate and rhythm ABD soft Neurologically intact MSK Neurovascularly intact Sensation intact distally Intact pulses distally Dorsiflexion/Plantar flexion intact Incision: dressing C/D/I Neurovascular intact Compartment soft  Assessment: 2 Days Post-Op  S/P Procedure(s) (LRB): TOTAL KNEE ARTHROPLASTY (Left) by Dr. Ernesta Amble. Murphy on 01/19/20  Active Problems:   S/P total knee arthroplasty, left   Primary osteoarthritis, status post left knee arthroplasty   Plan: Advance diet Up  with therapy D/C IV fluids Discharge home with home health Incentive Spirometry Elevate and Apply ice CPM, bone foam  Nausea: Pt. Given phenergan Pain: Oxycodone, pt unable to take muscle relaxer, NSAID's.  Weight Bearing: Weight Bearing as Tolerated (WBAT) LLE Dressings: Maintain Mepilex.  Please ensure thigh high TED hose are applied to operative leg prior to discharge. VTE prophylaxis: Aspirin, SCDs, ambulation Dispo: Home  With HHPT today      Rachael Fee, PA-C 01/21/2020, 7:11 AM

## 2020-01-22 ENCOUNTER — Inpatient Hospital Stay (HOSPITAL_COMMUNITY): Payer: BC Managed Care – PPO

## 2020-01-22 DIAGNOSIS — K900 Celiac disease: Secondary | ICD-10-CM | POA: Diagnosis present

## 2020-01-22 DIAGNOSIS — K219 Gastro-esophageal reflux disease without esophagitis: Secondary | ICD-10-CM | POA: Diagnosis present

## 2020-01-22 DIAGNOSIS — Z6833 Body mass index (BMI) 33.0-33.9, adult: Secondary | ICD-10-CM | POA: Diagnosis not present

## 2020-01-22 DIAGNOSIS — J9601 Acute respiratory failure with hypoxia: Secondary | ICD-10-CM | POA: Diagnosis not present

## 2020-01-22 DIAGNOSIS — Z7982 Long term (current) use of aspirin: Secondary | ICD-10-CM | POA: Diagnosis not present

## 2020-01-22 DIAGNOSIS — N179 Acute kidney failure, unspecified: Secondary | ICD-10-CM | POA: Diagnosis present

## 2020-01-22 DIAGNOSIS — J45909 Unspecified asthma, uncomplicated: Secondary | ICD-10-CM | POA: Diagnosis present

## 2020-01-22 DIAGNOSIS — I110 Hypertensive heart disease with heart failure: Secondary | ICD-10-CM | POA: Diagnosis present

## 2020-01-22 DIAGNOSIS — I2510 Atherosclerotic heart disease of native coronary artery without angina pectoris: Secondary | ICD-10-CM | POA: Diagnosis present

## 2020-01-22 DIAGNOSIS — J9811 Atelectasis: Secondary | ICD-10-CM | POA: Diagnosis not present

## 2020-01-22 DIAGNOSIS — E785 Hyperlipidemia, unspecified: Secondary | ICD-10-CM | POA: Diagnosis present

## 2020-01-22 DIAGNOSIS — E7800 Pure hypercholesterolemia, unspecified: Secondary | ICD-10-CM | POA: Diagnosis present

## 2020-01-22 DIAGNOSIS — Z955 Presence of coronary angioplasty implant and graft: Secondary | ICD-10-CM | POA: Diagnosis not present

## 2020-01-22 DIAGNOSIS — Z7901 Long term (current) use of anticoagulants: Secondary | ICD-10-CM | POA: Diagnosis not present

## 2020-01-22 DIAGNOSIS — I5032 Chronic diastolic (congestive) heart failure: Secondary | ICD-10-CM | POA: Diagnosis present

## 2020-01-22 DIAGNOSIS — Z20822 Contact with and (suspected) exposure to covid-19: Secondary | ICD-10-CM | POA: Diagnosis present

## 2020-01-22 DIAGNOSIS — I252 Old myocardial infarction: Secondary | ICD-10-CM | POA: Diagnosis not present

## 2020-01-22 DIAGNOSIS — R197 Diarrhea, unspecified: Secondary | ICD-10-CM | POA: Diagnosis present

## 2020-01-22 DIAGNOSIS — Z79899 Other long term (current) drug therapy: Secondary | ICD-10-CM | POA: Diagnosis not present

## 2020-01-22 DIAGNOSIS — M1712 Unilateral primary osteoarthritis, left knee: Secondary | ICD-10-CM | POA: Diagnosis present

## 2020-01-22 LAB — CBC WITH DIFFERENTIAL/PLATELET
Abs Immature Granulocytes: 0.03 10*3/uL (ref 0.00–0.07)
Basophils Absolute: 0 10*3/uL (ref 0.0–0.1)
Basophils Relative: 0 %
Eosinophils Absolute: 0.1 10*3/uL (ref 0.0–0.5)
Eosinophils Relative: 1 %
HCT: 36.1 % (ref 36.0–46.0)
Hemoglobin: 11.5 g/dL — ABNORMAL LOW (ref 12.0–15.0)
Immature Granulocytes: 0 %
Lymphocytes Relative: 20 %
Lymphs Abs: 2 10*3/uL (ref 0.7–4.0)
MCH: 27.9 pg (ref 26.0–34.0)
MCHC: 31.9 g/dL (ref 30.0–36.0)
MCV: 87.6 fL (ref 80.0–100.0)
Monocytes Absolute: 1.3 10*3/uL — ABNORMAL HIGH (ref 0.1–1.0)
Monocytes Relative: 13 %
Neutro Abs: 6.8 10*3/uL (ref 1.7–7.7)
Neutrophils Relative %: 66 %
Platelets: 223 10*3/uL (ref 150–400)
RBC: 4.12 MIL/uL (ref 3.87–5.11)
RDW: 15.7 % — ABNORMAL HIGH (ref 11.5–15.5)
WBC: 10.2 10*3/uL (ref 4.0–10.5)
nRBC: 0 % (ref 0.0–0.2)

## 2020-01-22 LAB — BRAIN NATRIURETIC PEPTIDE: B Natriuretic Peptide: 40 pg/mL (ref 0.0–100.0)

## 2020-01-22 LAB — BASIC METABOLIC PANEL
Anion gap: 9 (ref 5–15)
BUN: 12 mg/dL (ref 6–20)
CO2: 29 mmol/L (ref 22–32)
Calcium: 9 mg/dL (ref 8.9–10.3)
Chloride: 99 mmol/L (ref 98–111)
Creatinine, Ser: 0.71 mg/dL (ref 0.44–1.00)
GFR, Estimated: >60 mL/min (ref >60)
Glucose, Bld: 130 mg/dL — ABNORMAL HIGH (ref 70–99)
Potassium: 3.6 mmol/L (ref 3.5–5.1)
Sodium: 137 mmol/L (ref 135–145)

## 2020-01-22 MED ORDER — DIPHENHYDRAMINE HCL 50 MG/ML IJ SOLN: 50.0000 mg | Freq: Once | INTRAMUSCULAR | Status: AC

## 2020-01-22 MED ORDER — DIPHENHYDRAMINE HCL 25 MG PO CAPS: 50.0000 mg | ORAL_CAPSULE | Freq: Once | ORAL | Status: DC

## 2020-01-22 MED ORDER — HYDROMORPHONE HCL 2 MG PO TABS: 2.0000 mg | ORAL_TABLET | ORAL | 0 refills | Status: DC | PRN

## 2020-01-22 MED ORDER — PREDNISONE 5 MG PO TABS: 50.0000 mg | ORAL_TABLET | Freq: Four times a day (QID) | ORAL | Status: DC

## 2020-01-22 MED ORDER — HYDROCORTISONE NA SUCCINATE PF 250 MG IJ SOLR
200.0000 mg | Freq: Once | INTRAMUSCULAR | Status: AC
  Administered 2020-01-22: 200 mg via INTRAVENOUS
  Filled 2020-01-22: qty 200

## 2020-01-22 MED ORDER — IOHEXOL 350 MG/ML SOLN
100.0000 mL | Freq: Once | INTRAVENOUS | Status: AC | PRN
  Administered 2020-01-22: 100 mL via INTRAVENOUS

## 2020-01-22 MED ORDER — DIPHENHYDRAMINE HCL 25 MG PO CAPS
50.0000 mg | ORAL_CAPSULE | Freq: Once | ORAL | Status: AC
  Administered 2020-01-22: 50 mg via ORAL
  Filled 2020-01-22: qty 2

## 2020-01-22 NOTE — Progress Notes (Signed)
    Subjective: Patient reports pain as moderate. Feels that the PO dilaudid is working much better then the PO oxycodone.  Tolerating diet.  Urinating.  +Flatus.  No CP, SOB.  OOB with PT/OT. +nausea   Objective:   VITALS:   Vitals:   01/21/20 2005 01/22/20 0615 01/22/20 0907 01/22/20 0908  BP: 106/74 110/74    Pulse: (!) 102 94    Resp: 18 18    Temp: 99.9 F (37.7 C) 99.3 F (37.4 C)    TempSrc: Oral Oral    SpO2: (!) 88% (!) 89% (!) 88% 90%  Weight:      Height:       CBC Latest Ref Rng & Units 01/22/2020 01/21/2020 01/15/2020  WBC 4.0 - 10.5 K/uL 10.2 12.2(H) 9.2  Hemoglobin 12.0 - 15.0 g/dL 11.5(L) 12.1 12.3  Hematocrit 36.0 - 46.0 % 36.1 39.1 39.4  Platelets 150 - 400 K/uL 223 250 299   BMP Latest Ref Rng & Units 01/22/2020 01/21/2020 01/15/2020  Glucose 70 - 99 mg/dL 130(H) 139(H) 96  BUN 6 - 20 mg/dL 12 14 17   Creatinine 0.44 - 1.00 mg/dL 0.71 1.15(H) 1.02(H)  Sodium 135 - 145 mmol/L 137 138 139  Potassium 3.5 - 5.1 mmol/L 3.6 4.1 3.9  Chloride 98 - 111 mmol/L 99 97(L) 102  CO2 22 - 32 mmol/L 29 29 24   Calcium 8.9 - 10.3 mg/dL 9.0 9.3 9.3   Intake/Output      01/13 0701 01/14 0700 01/14 0701 01/15 0700   P.O. 1080 480   I.V. (mL/kg) 42.7 (0.5)    IV Piggyback 300    Total Intake(mL/kg) 1422.7 (15.4) 480 (5.2)   Urine (mL/kg/hr) 1700 (0.8) 400 (1.1)   Stool     Total Output 1700 400   Net -277.3 +80           Physical Exam: General: NAD.  Resting in bed, comfortable Resp: No increased wob Cardio: regular rate and rhythm ABD soft Neurologically intact MSK Neurovascularly intact Sensation intact distally Intact pulses distally Dorsiflexion/Plantar flexion intact Incision: dressing C/D/I Neurovascular intact Compartment soft  Assessment: 3 Days Post-Op  S/P Procedure(s) (LRB): TOTAL KNEE ARTHROPLASTY (Left) by Dr. Ernesta Amble. Percell Miller on 01/19/20  Active Problems:   Essential hypertension   AKI (acute kidney injury) ([place])   S/P total knee  arthroplasty, left   Acute respiratory failure with hypoxia ([facility])  Elevated D-Dimer Atelectasis   Primary osteoarthritis, status post left knee arthroplasty   Plan: Advance diet Up with therapy D/C IV fluids Discharge home with home health Incentive Spirometry Elevate and Apply ice CPM, bone foam Elevated D-dimer: suspect that this is 2/2 surgery. PT. Is getting CT angio to r/o PE Atelectasis: went over IS with pt. 10x/hr or more. Also talked with her about the importance of getting OOB and moving around.   Nausea: Pt. Given phenergan Pain: Oxycodone, pt unable to take muscle relaxer, NSAID's.  Weight Bearing: Weight Bearing as Tolerated (WBAT) LLE Dressings: Maintain Mepilex.  Please ensure thigh high TED hose are applied to operative leg prior to discharge. VTE prophylaxis: Aspirin, SCDs, ambulation Dispo: Home  With HHPT.  IF CT is negative will plan for today or early tomorrow. Would like to continue further w/u as OP if at all possible.       Rachael Fee, PA-C 01/22/2020, 11:02 AM

## 2020-01-22 NOTE — Progress Notes (Signed)
Physical Therapy Treatment Patient Details Name: GAYNOR GENCO MRN: [id number] DOB: 1960/10/23 Today's Date: 01/22/2020    History of Present Illness Patient is 60 y.o. female s/p Lt TKA on 01/19/20 with PMH significant for MI, HTN, CAD, CHF, celiac, asthma, OA, anemia, cardiac cath & stent in 2016.    PT Comments    POD # 3 Pt progressing well with her TKR mobility. General bed mobility comments: pt using opposite LE to self assist L LE on/off bed with increased time.   General transfer comment: good safety cognition and use of hands to steady self.  General Gait Details: tolerated an increased distance to bathroom and in hallway on RA avg sats ranged from 85 to 89% and HR 126.  Much coughing (dry). Assisted back to bed to perform TKR TE's followed by ICE.    Follow Up Recommendations  Outpatient PT;Follow surgeon's recommendation for DC plan and follow-up therapies     Equipment Recommendations  None recommended by PT    Recommendations for Other Services       Precautions / Restrictions Precautions Precautions: Fall Precaution Comments: instructed no pillow under knee Restrictions Weight Bearing Restrictions: No Other Position/Activity Restrictions: WBAT    Mobility  Bed Mobility Overal bed mobility: Needs Assistance Bed Mobility: Supine to Sit;Sit to Supine     Supine to sit: Supervision Sit to supine: Supervision;Min guard   General bed mobility comments: pt using opposite LE to self assist L LE on/off bed with increased time  Transfers Overall transfer level: Needs assistance Equipment used: Rolling walker (2 wheeled) Transfers: Sit to/from [facility] Sit to Stand: Supervision Stand pivot transfers: Supervision       General transfer comment: good safety cognition and use of hands to steady self  Ambulation/Gait Ambulation/Gait assistance: Supervision Gait Distance (Feet): 75 Feet Assistive device: Rolling walker (2 wheeled) Gait  Pattern/deviations: Step-to pattern;Decreased weight shift to left Gait velocity: decreased   General Gait Details: tolerated an increased distance to bathroom and in hallway on RA avg sats ranged from 85 to 89% and HR 126.  Much coughing (dry).   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      Exercises   Total Knee Replacement TE's following HEP handout 10 reps B LE ankle pumps 05 reps towel squeezes 05 reps knee presses 05 reps heel slides  05 reps SAQ's 05 reps SLR's 05 reps ABD Educated on use of gait belt to assist with TE's Followed by ICE     General Comments        Pertinent Vitals/Pain Pain Assessment: 0-10 Pain Score: 5  Pain Location: Lt knee Pain Descriptors / Indicators: Sore;Tightness Pain Intervention(s): Monitored during session;Premedicated before session;Repositioned;Ice applied    Home Living  Prior Function            PT Goals (current goals can now be found in the care plan section) Progress towards PT goals: Progressing toward goals    Frequency    7X/week      PT Plan Current plan remains appropriate    Co-evaluation              AM-PAC PT "6 Clicks" Mobility   Outcome Measure  Help needed turning from your back to your side while in a flat bed without using bedrails?: None Help needed moving from lying on your back to sitting on the side of a flat bed without using bedrails?: None Help needed moving to and from a bed to a chair (including a wheelchair)?: None Help needed standing up from a chair  using your arms (e.g., wheelchair or bedside chair)?: None Help needed to walk in hospital room?: None Help needed climbing 3-5 steps with a railing? : A Little 6 Click Score: 23    End of Session Equipment Utilized During Treatment: Gait belt Activity Tolerance: Patient tolerated treatment well Patient left: with call bell/phone within reach;in bed Nurse Communication: Mobility status PT Visit Diagnosis: Unsteadiness on feet (R26.81);Muscle weakness (generalized) (M62.81);Pain Pain - Right/Left: Left Pain - part of body: Knee     Time: 1350-1415 PT Time Calculation (min) (ACUTE ONLY): 25 min  Charges:  $Gait Training: 8-22 mins $Therapeutic Exercise: 8-22 mins                     Rica Koyanagi  PTA Acute  Rehabilitation Services Pager      [phone] Office      [phone]

## 2020-01-22 NOTE — Progress Notes (Signed)
Occupational Therapy Treatment Patient Details Name: Brenda Craig MRN: [id number] DOB: 1960/01/24 Today's Date: 01/22/2020    History of present illness Patient is 60 y.o. female s/p Lt TKA on 01/19/20 with PMH significant for MI, HTN, CAD, CHF, celiac, asthma, OA, anemia, cardiac cath & stent in 2016.   OT comments  Treatment focused on patient improving participation in ADLs. Patient demonstrated ability to perform bed transfers with min assist, supervision to ambulate in room with RW to perform toileting. Discussed how patient will perform all ADLs at home and recommended DME. Patient has 24/7 assistance. Patient has met OT goals. No further OT needs.  Follow Up Recommendations  No OT follow up;Follow surgeon's recommendation for DC plan and follow-up therapies    Equipment Recommendations       Recommendations for Other Services      Precautions / Restrictions Precautions Precautions: Fall Precaution Comments: instructed no pillow under knee Restrictions Weight Bearing Restrictions: No LLE Weight Bearing: Weight bearing as tolerated Other Position/Activity Restrictions: WBAT       Mobility Bed Mobility Overal bed mobility: Needs Assistance Bed Mobility: Supine to Sit;Sit to Supine     Supine to sit: Min assist Sit to supine: Min assist   General bed mobility comments: Min assist to assist LLE.  Transfers Overall transfer level: Needs assistance Equipment used: Rolling walker (2 wheeled) Transfers: Sit to/from [facility] Sit to Stand: Supervision Stand pivot transfers: Supervision       General transfer comment: supervision to ambulate to bathroom and perform toilet transfer with RW    Balance Overall balance assessment: Needs assistance Sitting-balance support: No upper extremity supported Sitting balance-Leahy Scale: Good     Standing balance support: Bilateral upper extremity supported Standing balance-Leahy Scale: Fair Standing  balance comment: can take hands off of walker for clothing management                           ADL either performed or assessed with clinical judgement   ADL Overall ADL's : Needs assistance/impaired               Lower Body Bathing Details (indicate cue type and reason): Discussed shower at home. Reports her daughter will assist as needed.       Lower Body Dressing Details (indicate cue type and reason): Able to manage clothing from knees up. Reports her daughter will assist her with shoes and socks. Not interested in AE. Toilet Transfer: Supervision/safety;Regular Toilet;RW;Grab bars;Ambulation   Toileting- Clothing Manipulation and Hygiene: Supervision/safety;Sit to/from stand         General ADL Comments: Pt with difficulty reaching LLE at this time. Does not need equipment as she will have ability to do this once pain subsides.  Daughter and mother are there to assist at d/c.     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, fashion/clothing     Shoulder Instructions       General Comments  Pertinent Vitals/ Pain       Pain Assessment: Faces Pain Score: 5  Faces Pain Scale: Hurts a little bit Pain Location: Lt knee Pain Descriptors / Indicators: Sore;Tightness Pain Intervention(s): Monitored during session;Premedicated before session  Home Living                                          Prior Functioning/Environment              Frequency           Progress Toward Goals  OT Goals(current goals can now be found in the care plan section)  Progress  towards OT goals: Goals met/education completed, patient discharged from OT  Acute Rehab OT Goals OT Goal Formulation: All assessment and education complete, DC therapy  Plan All goals met and education completed, patient discharged from OT services    Co-evaluation                 AM-PAC OT "6 Clicks" Daily Activity     Outcome Measure   Help from another person eating meals?: None Help from another person taking care of personal grooming?: None Help from another person toileting, which includes using toliet, bedpan, or urinal?: A Little Help from another person bathing (including washing, rinsing, drying)?: None Help from another person to put on and taking off regular upper body clothing?: None Help from another person to put on and taking off regular lower body clothing?: A Little 6 Click Score: 22    End of Session Equipment Utilized During Treatment: Oxygen;Rolling walker  OT Visit Diagnosis: Unsteadiness on feet (R26.81);Pain Pain - Right/Left: Left Pain - part of body: Knee   Activity Tolerance Patient tolerated treatment well   Patient Left in bed;with call bell/phone within reach   Nurse Communication Mobility status        Time: 4239-5320 OT Time Calculation (min): 16 min  Charges: OT General Charges $OT Visit: 1 Visit OT Treatments $Self Care/Home Management : 8-22 mins  Derl Barrow, OTR/L [place]  Office [phone] Pager: [place] 01/22/2020, 3:53 PM

## 2020-01-22 NOTE — Progress Notes (Signed)
PROGRESS NOTE    Brenda Craig  YQI:[id number] DOB: 10-Jul-1960 DOA: 01/19/2020 PCP: Albina Billet, MD   Chief Complain: Consultation for hypoxia  Brief Narrative: Patient is a 60 year old female with history of chronic diastolic congestive heart failure, cardiac arrest, coronary artery disease, hypertension, osteoarthritis, hyperlipidemia, asthma who was admitted under orthopedic service for undergoing elective total knee arthroplasty on 01/19/2020. She started having shortness of breath and became hypoxic desaturating on room air, became tachycardic while ambulating with physical therapy on 1/13. TRH was consulted for acute hypoxic respiratory failure management. Chest x-ray was unremarkable besides bilateral atelectasis.  Checking CT chest with contrast to rule out PE.  Assessment & Plan:   Active Problems:   Essential hypertension   AKI (acute kidney injury) ([place])   S/P total knee arthroplasty, left   Acute respiratory failure with hypoxia (HCC)   Acute hypoxic respiratory failure: Chest imagings did not show any pneumonia or pleural effusion but showed bilateral atelectasis which could be attributing to hypoxia.  Currently requiring 3 to 4 L of oxygen per minute.  Auscultation revealed bibasilar crackles which support atelectasis.  Will do CT angio of the chest to rule out PE. She has mildly elevated D-dimer. We will also check BNP to rule out volume overload. IV fluid stopped. Continue supplemental oxygen as needed. She is not on oxygen at home. Continue incentive spirometry.  Diarrhea/myalgia/lymphadenopathy: COVID/flu screen negative. Continue supportive  care. Currently she is afebrile.  AKI: Resolved  Asthma: Currently not in exacerbation,no wheezes. Continue inhalers  Osteoarthritis of left knee: Status post total knee knee arthroplasty on 1/11. Management as per primary team  Hypertension: Continue amlodipine. Monitor blood pressure  History of diastolic congestivel  heart failure: Echo on 05/2016 showed ejection fraction of 65 to 38%, grade 1 diastolic dysfunction.         Antimicrobials:  Anti-infectives (From admission, onward)   Start     Dose/Rate Route Frequency Ordered Stop   01/19/20 1630  ceFAZolin (ANCEF) IVPB 2g/100 mL premix        2 g 200 mL/hr over 30 Minutes Intravenous Every 6 hours 01/19/20 1221 01/20/20 0429   01/19/20 0802  ceFAZolin (ANCEF) 2-4 GM/100ML-% IVPB       Note to Pharmacy: Kyra Leyland   : cabinet override      01/19/20 0802 01/20/20 0000   01/19/20 0800  ceFAZolin (ANCEF) IVPB 2g/100 mL premix        2 g 200 mL/hr over 30 Minutes Intravenous On call to O.R. 01/19/20 0751 01/19/20 1054      Subjective:  Patient seen and examined at the bedside this morning.  Hemodynamically stable during my evaluation.  Still requiring 3 to 4 L of oxygen per minute.  Was not in any kind of respiratory distress.  Having some cough  Objective: Vitals:   01/21/20 1200 01/21/20 1601 01/21/20 2005 01/22/20 0615  BP: 114/76 122/72 106/74 110/74  Pulse: (!) 101 97 (!) 102 94  Resp: 18 18 18 18   Temp: 98.7 F (37.1 C) 98.6 F (37 C) 99.9 F (37.7 C) 99.3 F (37.4 C)  TempSrc: Oral Oral Oral Oral  SpO2: 91% 94% (!) 88% (!) 89%  Weight:      Height:        Intake/Output Summary (Last 24 hours) at 01/22/2020 0744 Last data filed at 01/22/2020 7564 Gross per 24 hour  Intake 1662.73 ml  Output 1800 ml  Net -137.27 ml   Filed Weights   01/19/20  0755  Weight: 92.1 kg    Examination:  General exam: Not in distress,obese HEENT:PERRL,Oral mucosa moist, Ear/Nose normal on gross exam Respiratory system: Bilateral few crackles on bases Cardiovascular system: S1 & S2 heard, RRR. No JVD, murmurs, rubs, gallops or clicks. No pedal edema. Gastrointestinal system: Abdomen is nondistended, soft and nontender. No organomegaly or masses felt. Normal bowel sounds heard. Central nervous system: Alert and oriented. No focal  neurological deficits. Extremities: No edema, no clubbing ,no cyanosis,dressing on left knee Skin: No rashes, lesions or ulcers,no icterus ,no pallor    Data Reviewed: I have personally reviewed following labs and imaging studies  CBC: Recent Labs  Lab 01/15/20 1547 01/21/20 1255  WBC 9.2 12.2*  HGB 12.3 12.1  HCT 39.4 39.1  MCV 87.0 88.3  PLT 299 557   Basic Metabolic Panel: Recent Labs  Lab 01/15/20 1547 01/21/20 1255  NA 139 138  K 3.9 4.1  CL 102 97*  CO2 24 29  GLUCOSE 96 139*  BUN 17 14  CREATININE 1.02* 1.15*  CALCIUM 9.3 9.3   GFR: Estimated Creatinine Clearance: 58.3 mL/min (A) (by C-G formula based on SCr of 1.15 mg/dL (H)). Liver Function Tests: No results for input(s): AST, ALT, ALKPHOS, BILITOT, PROT, ALBUMIN in the last 168 hours. No results for input(s): LIPASE, AMYLASE in the last 168 hours. No results for input(s): AMMONIA in the last 168 hours. Coagulation Profile: No results for input(s): INR, PROTIME in the last 168 hours. Cardiac Enzymes: No results for input(s): CKTOTAL, CKMB, CKMBINDEX, TROPONINI in the last 168 hours. BNP (last 3 results) No results for input(s): PROBNP in the last 8760 hours. HbA1C: No results for input(s): HGBA1C in the last 72 hours. CBG: No results for input(s): GLUCAP in the last 168 hours. Lipid Profile: No results for input(s): CHOL, HDL, LDLCALC, TRIG, CHOLHDL, LDLDIRECT in the last 72 hours. Thyroid Function Tests: No results for input(s): TSH, T4TOTAL, FREET4, T3FREE, THYROIDAB in the last 72 hours. Anemia Panel: No results for input(s): VITAMINB12, FOLATE, FERRITIN, TIBC, IRON, RETICCTPCT in the last 72 hours. Sepsis Labs: No results for input(s): PROCALCITON, LATICACIDVEN in the last 168 hours.  Recent Results (from the past 240 hour(s))  SARS CORONAVIRUS 2 (TAT 6-24 HRS) Nasopharyngeal Nasopharyngeal Swab     Status: None   Collection Time: 01/15/20  3:17 PM   Specimen: Nasopharyngeal Swab  Result  Value Ref Range Status   SARS Coronavirus 2 NEGATIVE NEGATIVE Final    Comment: (NOTE) SARS-CoV-2 target nucleic acids are NOT DETECTED.  The SARS-CoV-2 RNA is generally detectable in upper and lower respiratory specimens during the acute phase of infection. Negative results do not preclude SARS-CoV-2 infection, do not rule out co-infections with other pathogens, and should not be used as the sole basis for treatment or other patient management decisions. Negative results must be combined with clinical observations, patient history, and epidemiological information. The expected result is Negative.  Fact Sheet for Patients: SugarRoll.be  Fact Sheet for Healthcare Providers: https://www.woods-mathews.com/  This test is not yet approved or cleared by the [place] FDA and  has been authorized for detection and/or diagnosis of SARS-CoV-2 by FDA under an Emergency Use Authorization (EUA). This EUA will remain  in effect (meaning this test can be used) for the duration of the COVID-19 declaration under Se ction 564(b)(1) of the Act, 21 U.S.C. section 360bbb-3(b)(1), unless the authorization is terminated or revoked sooner.  Performed at [facility], [place] [address], [place], [place] [address]  Surgical pcr screen     Status: None   Collection Time: 01/15/20  3:44 PM   Specimen: Nasal Mucosa; Nasal Swab  Result Value Ref Range Status   MRSA, PCR NEGATIVE NEGATIVE Final   Staphylococcus aureus NEGATIVE NEGATIVE Final    Comment: (NOTE) The Xpert SA Assay (FDA approved for NASAL specimens in patients 65 years of age and older), is one component of a comprehensive surveillance program. It is not intended to diagnose infection nor to guide or monitor treatment. Performed at [facility], [place] [address], [place], [place] [address]   Resp panel by RT-PCR (RSV, Flu A&B, Covid) Nasopharyngeal Swab     Status: None    Collection Time: 01/21/20  1:07 PM   Specimen: Nasopharyngeal Swab; Nasopharyngeal(NP) swabs in vial transport medium  Result Value Ref Range Status   SARS Coronavirus 2 by RT PCR NEGATIVE NEGATIVE Final    Comment: (NOTE) SARS-CoV-2 target nucleic acids are NOT DETECTED.  The SARS-CoV-2 RNA is generally detectable in upper respiratory specimens during the acute phase of infection. The lowest concentration of SARS-CoV-2 viral copies this assay can detect is 138 copies/mL. A negative result does not preclude SARS-Cov-2 infection and should not be used as the sole basis for treatment or other patient management decisions. A negative result may occur with  improper specimen collection/handling, submission of specimen other than nasopharyngeal swab, presence of viral mutation(s) within the areas targeted by this assay, and inadequate number of viral copies(<138 copies/mL). A negative result must be combined with clinical observations, patient history, and epidemiological information. The expected result is Negative.  Fact Sheet for Patients:  EntrepreneurPulse.com.au  Fact Sheet for Healthcare Providers:  IncredibleEmployment.be  This test is no t yet approved or cleared by the [place] FDA and  has been authorized for detection and/or diagnosis of SARS-CoV-2 by FDA under an Emergency Use Authorization (EUA). This EUA will remain  in effect (meaning this test can be used) for the duration of the COVID-19 declaration under Section 564(b)(1) of the Act, 21 U.S.C.section 360bbb-3(b)(1), unless the authorization is terminated  or revoked sooner.       Influenza A by PCR NEGATIVE NEGATIVE Final   Influenza B by PCR NEGATIVE NEGATIVE Final    Comment: (NOTE) The Xpert Xpress SARS-CoV-2/FLU/RSV plus assay is intended as an aid in the diagnosis of influenza from Nasopharyngeal swab specimens and should not be used as a sole basis for treatment.  Nasal washings and aspirates are unacceptable for Xpert Xpress SARS-CoV-2/FLU/RSV testing.  Fact Sheet for Patients: EntrepreneurPulse.com.au  Fact Sheet for Healthcare Providers: IncredibleEmployment.be  This test is not yet approved or cleared by the [place] FDA and has been authorized for detection and/or diagnosis of SARS-CoV-2 by FDA under an Emergency Use Authorization (EUA). This EUA will remain in effect (meaning this test can be used) for the duration of the COVID-19 declaration under Section 564(b)(1) of the Act, 21 U.S.C. section 360bbb-3(b)(1), unless the authorization is terminated or revoked.     Resp Syncytial Virus by PCR NEGATIVE NEGATIVE Final    Comment: (NOTE) Fact Sheet for Patients: EntrepreneurPulse.com.au  Fact Sheet for Healthcare Providers: IncredibleEmployment.be  This test is not yet approved or cleared by the [place] FDA and has been authorized for detection and/or diagnosis of SARS-CoV-2 by FDA under an Emergency Use Authorization (EUA). This EUA will remain in effect (meaning this test can be used) for the duration of the COVID-19 declaration under Section 564(b)(1) of the Act,  21 U.S.C. section 360bbb-3(b)(1), unless the authorization is terminated or revoked.  Performed at [facility], [place] [address] [place], [place] [address]          Radiology Studies: DG CHEST PORT 1 VIEW  Result Date: 01/21/2020 CLINICAL DATA:  Hypoxia, trouble breathing since surgery on 01/19/2020 history asthma EXAM: PORTABLE CHEST 1 VIEW COMPARISON:  Portable exam 1217 hours compared to 02/23/2019 FINDINGS: Normal heart size, mediastinal contours, and pulmonary vascularity. Bibasilar atelectasis. Lungs otherwise clear. No pulmonary infiltrate, pleural effusion, or pneumothorax. IMPRESSION: Bibasilar atelectasis. Electronically Signed   By: Lavonia Dana M.D.   On:  01/21/2020 12:44        Scheduled Meds: . amLODipine  5 mg Oral Daily  . aspirin  81 mg Oral BID  . celecoxib  200 mg Oral BID  . docusate sodium  100 mg Oral BID  . famotidine  40 mg Oral QHS  . fluticasone furoate-vilanterol  1 puff Inhalation Daily   And  . umeclidinium bromide  1 puff Inhalation Daily  . LORazepam  1 mg Oral BID  . montelukast  10 mg Oral QHS  . pantoprazole  40 mg Oral Daily  . ticagrelor  60 mg Oral BID   Continuous Infusions: . methocarbamol (ROBAXIN) IV 500 mg (01/19/20 1407)     LOS: 0 days    Time spent:25 mins, More than 50% of that time was spent in counseling and/or coordination of care.      Shelly Coss, MD [facility] P1/14/2022, 7:44 AM

## 2020-01-23 LAB — CBC WITH DIFFERENTIAL/PLATELET
Abs Immature Granulocytes: 0.04 10*3/uL (ref 0.00–0.07)
Basophils Absolute: 0 10*3/uL (ref 0.0–0.1)
Basophils Relative: 0 %
Eosinophils Absolute: 0.1 10*3/uL (ref 0.0–0.5)
Eosinophils Relative: 1 %
HCT: 35.2 % — ABNORMAL LOW (ref 36.0–46.0)
Hemoglobin: 10.9 g/dL — ABNORMAL LOW (ref 12.0–15.0)
Immature Granulocytes: 0 %
Lymphocytes Relative: 21 %
Lymphs Abs: 2.3 10*3/uL (ref 0.7–4.0)
MCH: 27.5 pg (ref 26.0–34.0)
MCHC: 31 g/dL (ref 30.0–36.0)
MCV: 88.7 fL (ref 80.0–100.0)
Monocytes Absolute: 1.3 10*3/uL — ABNORMAL HIGH (ref 0.1–1.0)
Monocytes Relative: 12 %
Neutro Abs: 7 10*3/uL (ref 1.7–7.7)
Neutrophils Relative %: 66 %
Platelets: 262 10*3/uL (ref 150–400)
RBC: 3.97 MIL/uL (ref 3.87–5.11)
RDW: 15.3 % (ref 11.5–15.5)
WBC: 10.8 10*3/uL — ABNORMAL HIGH (ref 4.0–10.5)
nRBC: 0 % (ref 0.0–0.2)

## 2020-01-23 LAB — BASIC METABOLIC PANEL
Anion gap: 11 (ref 5–15)
BUN: 18 mg/dL (ref 6–20)
CO2: 28 mmol/L (ref 22–32)
Calcium: 8.9 mg/dL (ref 8.9–10.3)
Chloride: 100 mmol/L (ref 98–111)
Creatinine, Ser: 0.97 mg/dL (ref 0.44–1.00)
GFR, Estimated: >60 mL/min (ref >60)
Glucose, Bld: 103 mg/dL — ABNORMAL HIGH (ref 70–99)
Potassium: 3.4 mmol/L — ABNORMAL LOW (ref 3.5–5.1)
Sodium: 139 mmol/L (ref 135–145)

## 2020-01-23 MED ORDER — SODIUM CHLORIDE 0.9 % IV SOLN
3.0000 g | Freq: Four times a day (QID) | INTRAVENOUS | Status: DC
  Administered 2020-01-23: 3 g via INTRAVENOUS
  Filled 2020-01-23: qty 3

## 2020-01-23 MED ORDER — AMOXICILLIN-POT CLAVULANATE 875-125 MG PO TABS
1.0000 | ORAL_TABLET | Freq: Two times a day (BID) | ORAL | Status: DC
  Administered 2020-01-23: 1 via ORAL
  Filled 2020-01-23: qty 1

## 2020-01-23 MED ORDER — AMOXICILLIN-POT CLAVULANATE 875-125 MG PO TABS: 1.0000 | ORAL_TABLET | Freq: Two times a day (BID) | ORAL | 0 refills | Status: AC

## 2020-01-23 NOTE — Plan of Care (Signed)

## 2020-01-23 NOTE — Progress Notes (Signed)
RT unavaliable to place patient on CPAP

## 2020-01-23 NOTE — Progress Notes (Signed)
SATURATION QUALIFICATIONS: (This note is used to comply with regulatory documentation for home oxygen)  Patient Saturations on Room Air at Rest = 82%  Patient Saturations on Room Air while Ambulating = 85%  Patient Saturations on 3 Liters of oxygen while Ambulating = 93%  Please briefly explain why patient needs home oxygen: Patient's oxygen saturation drops with activity.

## 2020-01-23 NOTE — Discharge Summary (Addendum)
Discharge Summary  Patient ID: Brenda Craig MRN: [id number] DOB/AGE: Feb 16, 1960 60 y.o.  Admit date: 01/19/2020 Discharge date: 01/23/2020  Admission Diagnoses:  Left knee osteoarthritis  Discharge Diagnoses:  Active Problems:   Essential hypertension   AKI (acute kidney injury) ([place])   S/P total knee arthroplasty, left   Acute respiratory failure with hypoxia [facility])   Past Medical History:  Diagnosis Date  . Abnormal Pap smear of cervix 1988  . Acid reflux 01/17/2013  . Allergic rhinitis   . Anemia   . Anginal pain ([place])   . Anxiety   . Arthritis   . Asthma   . Celiac disease   . Cervical dysplasia 1988  . CHF (congestive heart failure) ([place])   . Complication of anesthesia    bp drops after anesthesia and has to be kept overnight  . Coronary artery disease   . Heart trouble   . History of cardiac arrest   . History of kidney stones   . History of mammogram 03/25/2013; 12/07/14   BIRADS 1; NEG  . History of Papanicolaou smear of cervix 03/20/12; 12/28/15   -/-; -/-  . Hypercholesteremia   . Hypertension   . Menopausal symptoms   . MI (myocardial infarction) ([place])   . Mitral valve prolapse   . Pneumonia   . PONV (postoperative nausea and vomiting)    nausea only  . Sleep apnea   . Vulvovaginitis    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    Surgeries: Procedure(s): TOTAL KNEE ARTHROPLASTY on 01/19/2020   Consultants (if any): Medicine Service  Discharged Condition: Improved  Hospital Course: Brenda Craig is an 60 y.o. female who was admitted 01/19/2020 with a diagnosis of left knee osteoarthritis and went to the operating room on 01/19/2020 and underwent the above named procedures.    Post-op day 2 she was found to hypoxemic and tachycardic. Medicine service was consulted for management of acute hypoxemia. Chest x-ray showed atelectasis, D dimer was elevated, and CTA chest was performed to rule out PE. CT chest showed negative for PE  with bilateral basilar consolidation/volume loss favoring atelectasis. She was initially stated on Unasyn but patient is adamant about discharging home today and was this switched to oral Augmentin for 5 days. She will be discharged with home oxygen as well.   She was given perioperative antibiotics:  Anti-infectives (From admission, onward)   Start     Dose/Rate Route Frequency Ordered Stop   01/23/20 1200  amoxicillin-clavulanate (AUGMENTIN) 875-125 MG per tablet 1 tablet        1 tablet Oral Every 12 hours 01/23/20 0858 01/28/20 0959   01/23/20 0815  Ampicillin-Sulbactam (UNASYN) 3 g in sodium chloride 0.9 % 100 mL IVPB  Status:  Discontinued        3 g 200 mL/hr over 30 Minutes Intravenous Every 6 hours 01/23/20 0802 01/23/20 0858   01/23/20 0000  amoxicillin-clavulanate (AUGMENTIN) 875-125 MG tablet        1 tablet Oral 2 times daily 01/23/20 1216 01/28/20 2359   01/19/20 1630  ceFAZolin (ANCEF) IVPB 2g/100 mL premix        2 g 200 mL/hr over 30 Minutes Intravenous Every 6 hours 01/19/20 1221 01/20/20 0429   01/19/20 0802  ceFAZolin (ANCEF) 2-4 GM/100ML-% IVPB       Note to Pharmacy: Kyra Leyland   : cabinet override      01/19/20 0802 01/20/20 0000   01/19/20 0800  ceFAZolin (ANCEF)  IVPB 2g/100 mL premix        2 g 200 mL/hr over 30 Minutes Intravenous On call to O.R. 01/19/20 0751 01/19/20 1054    .  She was given sequential compression devices, early ambulation, and aspirin for DVT prophylaxis.  She benefited maximally from the hospital stay and there were no complications.    Recent vital signs:  Vitals:   01/23/20 0603 01/23/20 0942  BP: 100/66   Pulse: 85   Resp: 18   Temp: 98.7 F (37.1 C)   SpO2: (!) 89% (!) 82%    Recent laboratory studies:  Lab Results  Component Value Date   HGB 11.5 (L) 01/22/2020   HGB 12.1 01/21/2020   HGB 12.3 01/15/2020   Lab Results  Component Value Date   WBC 10.2 01/22/2020   PLT 223 01/22/2020   Lab Results  Component  Value Date   INR 1.1 12/14/2018   Lab Results  Component Value Date   NA 137 01/22/2020   K 3.6 01/22/2020   CL 99 01/22/2020   CO2 29 01/22/2020   BUN 12 01/22/2020   CREATININE 0.71 01/22/2020   GLUCOSE 130 (H) 01/22/2020    Discharge Medications:   Allergies as of 01/23/2020      Reactions   Contrast Media [iodinated Diagnostic Agents] Itching   Other    Gluten allergy   Codeine Nausea And Vomiting, Rash   Nexium [esomeprazole Magnesium] Palpitations   Omeprazole Magnesium Palpitations   Oxycodone-acetaminophen Itching, Nausea And Vomiting, Rash   Percocet [oxycodone-acetaminophen] Itching, Nausea And Vomiting, Rash   Can take with benadryl    Tramadol Rash   Can take with benadryl       Medication List    TAKE these medications   acetaminophen 500 MG tablet Commonly known as: TYLENOL Take 2 tablets (1,000 mg total) by mouth every 6 (six) hours.   albuterol 108 (90 Base) MCG/ACT inhaler Commonly known as: VENTOLIN HFA Inhale 1-2 puffs into the lungs every 4 (four) hours as needed for wheezing or shortness of breath.   amLODipine 5 MG tablet Commonly known as: NORVASC Take 1 tablet (5 mg total) by mouth daily. What changed: when to take this   amoxicillin 500 MG tablet Commonly known as: AMOXIL Take 2,000 mg by mouth See admin instructions. Before dental procedures   amoxicillin-clavulanate 875-125 MG tablet Commonly known as: Augmentin Take 1 tablet by mouth 2 (two) times daily for 5 days.   Breztri Aerosphere 160-9-4.8 MCG/ACT Aero Generic drug: Budeson-Glycopyrrol-Formoterol Inhale 2 puffs into the lungs daily.   celecoxib 200 MG capsule Commonly known as: CeleBREX Take 1 capsule (200 mg total) by mouth 2 (two) times daily for 14 days.   chlorpheniramine-HYDROcodone 10-8 MG/5ML Suer Commonly known as: TUSSIONEX Take 5 mLs by mouth every 12 (twelve) hours as needed for cough.   clotrimazole-betamethasone cream Commonly known as: LOTRISONE APPLY  TO AFFECTED AREA TWICE A DAY AS NEEDED FOR SYMPTOMS FOR UP TO 2 WEEKS What changed:   how much to take  how to take this  when to take this  reasons to take this   [facility] Aspirin Adult Low Dose 81 MG chewable tablet Generic drug: aspirin Chew 81 mg by mouth daily. What changed: Another medication with the same name was added. Make sure you understand how and when to take each.   aspirin EC 81 MG tablet Take 1 tablet (81 mg total) by mouth in the morning and at bedtime. Swallow whole. What changed:  You were already taking a medication with the same name, and this prescription was added. Make sure you understand how and when to take each.   diclofenac Sodium 1 % Gel Commonly known as: VOLTAREN Apply 1 application topically 4 (four) times daily as needed (pain).   diphenhydrAMINE 50 MG tablet Commonly known as: BENADRYL Take 0.5-1 tablets (25-50 mg total) by mouth every 6 (six) hours as needed for itching (Take with oxycodone).   diphenhydrAMINE 50 MG tablet Commonly known as: BENADRYL Take 0.5 tablets (25 mg total) by mouth at bedtime as needed for itching (take with oxycodone).   famotidine 40 MG tablet Commonly known as: PEPCID Take 40 mg by mouth at bedtime.   HYDROmorphone 2 MG tablet Commonly known as: DILAUDID Take 1 tablet (2 mg total) by mouth every 4 (four) hours as needed for severe pain.   LORazepam 1 MG tablet Commonly known as: ATIVAN Take 1 mg by mouth 2 (two) times daily.   methocarbamol 500 MG tablet Commonly known as: Robaxin Take 1 tablet (500 mg total) by mouth every 8 (eight) hours as needed for muscle spasms.   montelukast 10 MG tablet Commonly known as: SINGULAIR Take 10 mg by mouth at bedtime.   Nitrostat 0.4 MG SL tablet Generic drug: nitroGLYCERIN Place 0.4 mg under the tongue every 5 (five) minutes as needed for chest pain.   ondansetron 4 MG tablet Commonly known as: Zofran Take 1 tablet (4 mg total) by mouth every 8 (eight) hours as  needed for nausea or vomiting.   ondansetron 4 MG tablet Commonly known as: ZOFRAN Take 1 tablet (4 mg total) by mouth every 6 (six) hours as needed for nausea.   oxyCODONE 5 MG immediate release tablet Commonly known as: Roxicodone Take 1 tablet (5 mg total) by mouth every 6 (six) hours as needed for up to 5 days for severe pain.   pantoprazole 40 MG tablet Commonly known as: PROTONIX Take 40 mg by mouth every morning. What changed: Another medication with the same name was added. Make sure you understand how and when to take each.   pantoprazole 20 MG tablet Commonly known as: Protonix Take 1 tablet (20 mg total) by mouth daily for 20 doses. What changed: You were already taking a medication with the same name, and this prescription was added. Make sure you understand how and when to take each.   ticagrelor 60 MG Tabs tablet Commonly known as: BRILINTA Take 60 mg by mouth 2 (two) times daily.            Durable Medical Equipment  (From admission, onward)         Start     Ordered   01/23/20 1139  For home use only DME oxygen  Once       Question Answer Comment  Length of Need 6 Months   Mode or (Route) Nasal cannula   Frequency Continuous (stationary and portable oxygen unit needed)   Oxygen delivery system Gas      01/23/20 1142   01/22/20 1101  For home use only DME Continuous passive motion machine  Once        01/22/20 1100   01/22/20 1100  For home use only DME Continuous passive motion machine  Once        01/22/20 1059          Diagnostic Studies: CT ANGIO CHEST PE W OR WO CONTRAST  Result Date: 01/22/2020 CLINICAL DATA:  PE suspected high probability,  status post total knee revision with low blood pressure, positive D-dimer and shortness of breath. EXAM: CT ANGIOGRAPHY CHEST WITH CONTRAST TECHNIQUE: Multidetector CT imaging of the chest was performed using the standard protocol during bolus administration of intravenous contrast. Multiplanar CT image  reconstructions and MIPs were obtained to evaluate the vascular anatomy. CONTRAST:  149m OMNIPAQUE IOHEXOL 350 MG/ML SOLN COMPARISON:  CT a chest May 21, 2016. FINDINGS: Cardiovascular: Satisfactory opacification of the pulmonary arteries to the segmental level. No evidence of pulmonary embolism. Normal heart size. No pericardial effusion. Mediastinum/Nodes: No enlarged mediastinal, hilar, or axillary lymph nodes. Thyroid gland and trachea demonstrate no significant findings. Small hiatal hernia. Lungs/Pleura: Consolidations and volume loss in the bilateral lower lobes. Upper Abdomen: Small accessory splenule. Right upper pole renal cyst. Musculoskeletal: No chest wall abnormality. No acute or significant osseous findings. Review of the MIP images confirms the above findings. IMPRESSION: 1. Negative examination for pulmonary embolism. 2. Consolidations and volume loss in the bilateral lower lobes, favor atelectasis. However, aspiration/infection possible. 3. Small hiatal hernia. Electronically Signed   By: JDahlia BailiffMD   On: 01/22/2020 17:57   DG CHEST PORT 1 VIEW  Result Date: 01/21/2020 CLINICAL DATA:  Hypoxia, trouble breathing since surgery on 01/19/2020 history asthma EXAM: PORTABLE CHEST 1 VIEW COMPARISON:  Portable exam 1217 hours compared to 02/23/2019 FINDINGS: Normal heart size, mediastinal contours, and pulmonary vascularity. Bibasilar atelectasis. Lungs otherwise clear. No pulmonary infiltrate, pleural effusion, or pneumothorax. IMPRESSION: Bibasilar atelectasis. Electronically Signed   By: MLavonia DanaM.D.   On: 01/21/2020 12:44   DG Knee Left Port  Result Date: 01/19/2020 CLINICAL DATA:  Status post left total knee replacement. EXAM: PORTABLE LEFT KNEE - 1-2 VIEW COMPARISON:  July 22, 2013. FINDINGS: The left femoral and tibial components are well situated. Expected postoperative changes are noted in the soft tissues anteriorly. IMPRESSION: Status post left total knee arthroplasty.  Electronically Signed   By: JMarijo ConceptionM.D.   On: 01/19/2020 14:36    Disposition: Discharge disposition: 06-Home-Health Care Svc          Follow-up Information    MRenette Butters MD On 02/03/2020.   Specialty: Orthopedic Surgery Contact information: [phone]                Signed: BJola Baptist1/15/2022, 12:17 PM

## 2020-01-23 NOTE — Progress Notes (Signed)
Physical Therapy Treatment Patient Details Name: Brenda Craig MRN: [id number] DOB: August 31, 1960 Today's Date: 01/23/2020    History of Present Illness Patient is 60 y.o. female s/p Lt TKA on 01/19/20 with PMH significant for MI, HTN, CAD, CHF, celiac, asthma, OA, anemia, cardiac cath & stent in 2016.    PT Comments    POD # 4 am session Pt in bed resting but awake with RA at 81% when RT came in to check her.  Per chart review, pt is rufusing home oxygen.  RT and I consulted on pt on poor oxygen levels and after much encouragement, pt finally agreed to wear at night.  O2 levels have been elevated during activity.   Assisted OOB to amb to bathroom then in hallway .  General Gait Details: tolerated an increased distance to bathroom and in hallway on RA avg sats ranged from 85 to 89% and HR 126.  Much coughing (dry).  Pt declines to wear oxygen despite education. Practiced stairs.  Then returned to room to perform some TE's following HEP handout.  Instructed on proper tech, freq as well as use of ICE.   Addressed all mobility questions, discussed appropriate activity, educated on use of ICE.  Pt ready for D/C to home.   Follow Up Recommendations  Outpatient PT;Follow surgeon's recommendation for DC plan and follow-up therapies     Equipment Recommendations  None recommended by PT    Recommendations for Other Services       Precautions / Restrictions Precautions Precautions: Fall Precaution Comments: instructed no pillow under knee Restrictions Weight Bearing Restrictions: No LLE Weight Bearing: Weight bearing as tolerated    Mobility  Bed Mobility Overal bed mobility: Modified Independent             General bed mobility comments: self able with increased time as pt uses opposite LE to assist TKR LE  Transfers Overall transfer level: Needs assistance Equipment used: Rolling walker (2 wheeled)   Sit to Stand: Supervision Stand pivot transfers: Supervision        General transfer comment: supervision to ambulate to bathroom and perform self toilet transfer with RW  Ambulation/Gait Ambulation/Gait assistance: Supervision Gait Distance (Feet): 85 Feet Assistive device: Rolling walker (2 wheeled) Gait Pattern/deviations: Step-to pattern;Decreased weight shift to left Gait velocity: decreased   General Gait Details: tolerated an increased distance to bathroom and in hallway on RA avg sats ranged from 85 to 89% and HR 126.  Much coughing (dry).  Pt declines to wear oxygen despite education.   Stairs Stairs: Yes   Stair Management: Two rails;Step to pattern Number of Stairs: 4 General stair comments: 25% VC's on proper sequencing and safety   Wheelchair Mobility    Modified Rankin (Stroke Patients Only)       Balance                                            Cognition Arousal/Alertness: Awake/alert Behavior During Therapy: WFL for tasks assessed/performed Overall Cognitive Status: Within Functional Limits for tasks assessed                                 General Comments: AxO x 3, [employer]      Exercises   Total Knee Replacement TE's following HEP handout 10 reps B  LE ankle pumps 05 reps towel squeezes 05 reps knee presses 05 reps heel slides  05 reps SAQ's 05 reps SLR's 05 reps ABD Educated on use of gait belt to assist with TE's Followed by ICE     General Comments        Pertinent Vitals/Pain Pain Assessment: 0-10 Pain Score: 5  Pain Location: L knee Pain Descriptors / Indicators: Sore;Tightness Pain Intervention(s): Monitored during session;Premedicated before session;Repositioned;Ice applied    Home Living                      Prior Function            PT Goals (current goals can now be found in the care plan section)      Frequency    7X/week      PT Plan Current plan remains appropriate    Co-evaluation               AM-PAC PT "6 Clicks" Mobility   Outcome Measure  Help needed turning from your back to your side while in a flat bed without using bedrails?: None Help needed moving from lying on your back to sitting on the side of a flat bed without using bedrails?: None Help needed moving to and from a bed to a chair (including a wheelchair)?: None Help needed standing up from a chair using your arms (e.g., wheelchair or bedside chair)?: None Help needed to walk in hospital room?: None Help needed climbing 3-5 steps with a railing? : None 6 Click Score: 24    End of Session Equipment Utilized During Treatment: Gait belt Activity Tolerance: Patient tolerated treatment well Patient left: with call bell/phone within reach;in bed Nurse Communication: Mobility status PT Visit Diagnosis: Unsteadiness on feet (R26.81);Muscle weakness (generalized) (M62.81);Pain Pain - Right/Left: Left Pain - part of body: Knee     Time: 5361-4431 PT Time Calculation (min) (ACUTE ONLY): 40 min  Charges:  $Gait Training: 8-22 mins $Therapeutic Exercise: 8-22 mins $Therapeutic Activity: 8-22 mins                     Rica Koyanagi  PTA Acute  Rehabilitation Services Pager      [phone] Office      [phone]

## 2020-01-23 NOTE — Progress Notes (Signed)
PROGRESS NOTE    Brenda Craig  LGX:[id number] DOB: [birth year] DOA: 01/19/2020 PCP: Albina Billet, MD   Chief Complain: Consultation for hypoxia  Brief Narrative: Patient is a 60 year old female with history of chronic diastolic congestive heart failure, cardiac arrest, coronary artery disease, hypertension, osteoarthritis, hyperlipidemia, asthma who was admitted under orthopedic service for undergoing elective total knee arthroplasty on 01/19/2020. She started having shortness of breath and became hypoxic desaturating on room air, became tachycardic while ambulating with physical therapy on 1/13. [facility] was consulted for acute hypoxic respiratory failure management. Chest x-ray was unremarkable besides bilateral atelectasis.   CT chest with contrast to rule out PE came out to be negative.  CT chest showed bilateral basilar consolidation/volume loss favoring atelectasis.  She does not have leukocytosis or fever , but aspiration  pneumonia cudnt be ruled out.Initially started on  Unasyn but since patient is adamant about going home,changed to augmentin .  We recommend  oxygen on discharge for short-term at home if her saturation remains less than 88%( patient refused to be on oxygen on discharge).Continue augmentin for total of 5 days duration.  Assessment & Plan:   Active Problems:   Essential hypertension   AKI (acute kidney injury) ([place])   S/P total knee arthroplasty, left   Acute respiratory failure with hypoxia ([facility])   Acute hypoxic respiratory failure: Chest imagings did not show any pneumonia or pleural effusion but showed bilateral atelectasis which could be attributing to hypoxia.  Currently requiring 3 to 4 L of oxygen per minute.  Auscultation revealed bibasilar crackles which support atelectasis. She had mildly elevated D-dimer.  BNP normal and she is not volume overloaded. IV fluid stopped. Continue supplemental oxygen as needed. She is not on oxygen at home. Continue incentive  spirometry. CT chest with contrast to rule out PE came out to be negative.  CT chest showed bilateral basilar consolidation/volume loss favoring atelectasis.  She does not have leukocytosis or fever , but aspiration  pneumonia cudnt be ruled out.Initially started on  Unasyn but since patient is adamant about going home,changed to augmentin to be continued for 5 days from today .  We recommend  oxygen on discharge for short-term at home if her saturation remains less than 88%.  Diarrhea/myalgia/lymphadenopathy: COVID/flu screen negative. Continue supportive  care. Currently she is afebrile.  AKI: Resolved  Asthma: Currently not in exacerbation,no wheezes. Continue inhalers  Osteoarthritis of left knee: Status post total knee knee arthroplasty on 1/11. Management as per primary team  Hypertension: Continue amlodipine. Monitor blood pressure  History of diastolic congestivel heart failure: Echo on 05/2016 showed ejection fraction of 65 to 81%, grade 1 diastolic dysfunction.         Antimicrobials:  Anti-infectives (From admission, onward)   Start     Dose/Rate Route Frequency Ordered Stop   01/19/20 1630  ceFAZolin (ANCEF) IVPB 2g/100 mL premix        2 g 200 mL/hr over 30 Minutes Intravenous Every 6 hours 01/19/20 1221 01/20/20 0429   01/19/20 0802  ceFAZolin (ANCEF) 2-4 GM/100ML-% IVPB       Note to Pharmacy: Kyra Leyland   : cabinet override      01/19/20 0802 01/20/20 0000   01/19/20 0800  ceFAZolin (ANCEF) IVPB 2g/100 mL premix        2 g 200 mL/hr over 30 Minutes Intravenous On call to O.R. 01/19/20 0751 01/19/20 1054      Subjective:  Patient seen and examined at the bedside this  morning.  She took out the oxygen and was sitting on the bed.  Looks comfortable.  Denies any shortness of breath or cough.  Adamant about going home  Objective: Vitals:   01/22/20 1518 01/22/20 2129 01/22/20 2145 01/23/20 0603  BP: 111/73 91/63  100/66  Pulse: 95 92  85  Resp: 16 16  18    Temp: 98.4 F (36.9 C) 98.2 F (36.8 C)  98.7 F (37.1 C)  TempSrc: Oral Oral  Oral  SpO2: (!) 89% (!) 88% 92% (!) 89%  Weight:      Height:        Intake/Output Summary (Last 24 hours) at 01/23/2020 0750 Last data filed at 01/22/2020 2200 Gross per 24 hour  Intake 720 ml  Output 300 ml  Net 420 ml   Filed Weights   01/19/20 0755  Weight: 92.1 kg    Examination:  General exam: Appears calm and comfortable ,Not in distress,obese HEENT:PERRL,Oral mucosa moist, Ear/Nose normal on gross exam Respiratory system: Bilateral basilar crackles  Cardiovascular system: S1 & S2 heard, RRR. No JVD, murmurs, rubs, gallops or clicks. Gastrointestinal system: Abdomen is nondistended, soft and nontender. No organomegaly or masses felt. Normal bowel sounds heard. Central nervous system: Alert and oriented. No focal neurological deficits. Extremities: No edema, no clubbing ,no cyanosis, distal peripheral pulses palpable. Skin: No rashes, lesions or ulcers,no icterus ,no pallor    Data Reviewed: I have personally reviewed following labs and imaging studies  CBC: Recent Labs  Lab 01/21/20 1255 01/22/20 0822  WBC 12.2* 10.2  NEUTROABS  --  6.8  HGB 12.1 11.5*  HCT 39.1 36.1  MCV 88.3 87.6  PLT 250 449   Basic Metabolic Panel: Recent Labs  Lab 01/21/20 1255 01/22/20 0822  NA 138 137  K 4.1 3.6  CL 97* 99  CO2 29 29  GLUCOSE 139* 130*  BUN 14 12  CREATININE 1.15* 0.71  CALCIUM 9.3 9.0   GFR: Estimated Creatinine Clearance: 83.8 mL/min (by C-G formula based on SCr of 0.71 mg/dL). Liver Function Tests: No results for input(s): AST, ALT, ALKPHOS, BILITOT, PROT, ALBUMIN in the last 168 hours. No results for input(s): LIPASE, AMYLASE in the last 168 hours. No results for input(s): AMMONIA in the last 168 hours. Coagulation Profile: No results for input(s): INR, PROTIME in the last 168 hours. Cardiac Enzymes: No results for input(s): CKTOTAL, CKMB, CKMBINDEX, TROPONINI in the  last 168 hours. BNP (last 3 results) No results for input(s): PROBNP in the last 8760 hours. HbA1C: No results for input(s): HGBA1C in the last 72 hours. CBG: No results for input(s): GLUCAP in the last 168 hours. Lipid Profile: No results for input(s): CHOL, HDL, LDLCALC, TRIG, CHOLHDL, LDLDIRECT in the last 72 hours. Thyroid Function Tests: No results for input(s): TSH, T4TOTAL, FREET4, T3FREE, THYROIDAB in the last 72 hours. Anemia Panel: No results for input(s): VITAMINB12, FOLATE, FERRITIN, TIBC, IRON, RETICCTPCT in the last 72 hours. Sepsis Labs: No results for input(s): PROCALCITON, LATICACIDVEN in the last 168 hours.  Recent Results (from the past 240 hour(s))  SARS CORONAVIRUS 2 (TAT 6-24 HRS) Nasopharyngeal Nasopharyngeal Swab     Status: None   Collection Time: 01/15/20  3:17 PM   Specimen: Nasopharyngeal Swab  Result Value Ref Range Status   SARS Coronavirus 2 NEGATIVE NEGATIVE Final    Comment: (NOTE) SARS-CoV-2 target nucleic acids are NOT DETECTED.  The SARS-CoV-2 RNA is generally detectable in upper and lower respiratory specimens during the acute phase of infection. Negative  results do not preclude SARS-CoV-2 infection, do not rule out co-infections with other pathogens, and should not be used as the sole basis for treatment or other patient management decisions. Negative results must be combined with clinical observations, patient history, and epidemiological information. The expected result is Negative.  Fact Sheet for Patients: SugarRoll.be  Fact Sheet for Healthcare Providers: https://www.woods-mathews.com/  This test is not yet approved or cleared by the [place] FDA and  has been authorized for detection and/or diagnosis of SARS-CoV-2 by FDA under an Emergency Use Authorization (EUA). This EUA will remain  in effect (meaning this test can be used) for the duration of the COVID-19 declaration under Se ction  564(b)(1) of the Act, 21 U.S.C. section 360bbb-3(b)(1), unless the authorization is terminated or revoked sooner.  Performed at [facility], [place] [address] [place], [place] [address]   Surgical pcr screen     Status: None   Collection Time: 01/15/20  3:44 PM   Specimen: Nasal Mucosa; Nasal Swab  Result Value Ref Range Status   MRSA, PCR NEGATIVE NEGATIVE Final   Staphylococcus aureus NEGATIVE NEGATIVE Final    Comment: (NOTE) The Xpert SA Assay (FDA approved for NASAL specimens in patients 21 years of age and older), is one component of a comprehensive surveillance program. It is not intended to diagnose infection nor to guide or monitor treatment. Performed at [facility], [place] [address] [place], [place] [address]   Resp panel by RT-PCR (RSV, Flu A&B, Covid) Nasopharyngeal Swab     Status: None   Collection Time: 01/21/20  1:07 PM   Specimen: Nasopharyngeal Swab; Nasopharyngeal(NP) swabs in vial transport medium  Result Value Ref Range Status   SARS Coronavirus 2 by RT PCR NEGATIVE NEGATIVE Final    Comment: (NOTE) SARS-CoV-2 target nucleic acids are NOT DETECTED.  The SARS-CoV-2 RNA is generally detectable in upper respiratory specimens during the acute phase of infection. The lowest concentration of SARS-CoV-2 viral copies this assay can detect is 138 copies/mL. A negative result does not preclude SARS-Cov-2 infection and should not be used as the sole basis for treatment or other patient management decisions. A negative result may occur with  improper specimen collection/handling, submission of specimen other than nasopharyngeal swab, presence of viral mutation(s) within the areas targeted by this assay, and inadequate number of viral copies(<138 copies/mL). A negative result must be combined with clinical observations, patient history, and epidemiological information. The expected result is Negative.  Fact Sheet for Patients:   EntrepreneurPulse.com.au  Fact Sheet for Healthcare Providers:  IncredibleEmployment.be  This test is no t yet approved or cleared by the [place] FDA and  has been authorized for detection and/or diagnosis of SARS-CoV-2 by FDA under an Emergency Use Authorization (EUA). This EUA will remain  in effect (meaning this test can be used) for the duration of the COVID-19 declaration under Section 564(b)(1) of the Act, 21 U.S.C.section 360bbb-3(b)(1), unless the authorization is terminated  or revoked sooner.       Influenza A by PCR NEGATIVE NEGATIVE Final   Influenza B by PCR NEGATIVE NEGATIVE Final    Comment: (NOTE) The Xpert Xpress SARS-CoV-2/FLU/RSV plus assay is intended as an aid in the diagnosis of influenza from Nasopharyngeal swab specimens and should not be used as a sole basis for treatment. Nasal washings and aspirates are unacceptable for Xpert Xpress SARS-CoV-2/FLU/RSV testing.  Fact Sheet for Patients: EntrepreneurPulse.com.au  Fact Sheet for Healthcare Providers: IncredibleEmployment.be  This test is not  yet approved or cleared by the [place] and has been authorized for detection and/or diagnosis of SARS-CoV-2 by FDA under an Emergency Use Authorization (EUA). This EUA will remain in effect (meaning this test can be used) for the duration of the COVID-19 declaration under Section 564(b)(1) of the Act, 21 U.S.C. section 360bbb-3(b)(1), unless the authorization is terminated or revoked.     Resp Syncytial Virus by PCR NEGATIVE NEGATIVE Final    Comment: (NOTE) Fact Sheet for Patients: EntrepreneurPulse.com.au  Fact Sheet for Healthcare Providers: IncredibleEmployment.be  This test is not yet approved or cleared by the [place] FDA and has been authorized for detection and/or diagnosis of SARS-CoV-2 by FDA under an Emergency Use  Authorization (EUA). This EUA will remain in effect (meaning this test can be used) for the duration of the COVID-19 declaration under Section 564(b)(1) of the Act, 21 U.S.C. section 360bbb-3(b)(1), unless the authorization is terminated or revoked.  Performed at [facility], [place] [address], [place], [place] [address]          Radiology Studies: CT ANGIO CHEST PE W OR WO CONTRAST  Result Date: 01/22/2020 CLINICAL DATA:  PE suspected high probability, status post total knee revision with low blood pressure, positive D-dimer and shortness of breath. EXAM: CT ANGIOGRAPHY CHEST WITH CONTRAST TECHNIQUE: Multidetector CT imaging of the chest was performed using the standard protocol during bolus administration of intravenous contrast. Multiplanar CT image reconstructions and MIPs were obtained to evaluate the vascular anatomy. CONTRAST:  136m OMNIPAQUE IOHEXOL 350 MG/ML SOLN COMPARISON:  CT a chest May 21, 2016. FINDINGS: Cardiovascular: Satisfactory opacification of the pulmonary arteries to the segmental level. No evidence of pulmonary embolism. Normal heart size. No pericardial effusion. Mediastinum/Nodes: No enlarged mediastinal, hilar, or axillary lymph nodes. Thyroid gland and trachea demonstrate no significant findings. Small hiatal hernia. Lungs/Pleura: Consolidations and volume loss in the bilateral lower lobes. Upper Abdomen: Small accessory splenule. Right upper pole renal cyst. Musculoskeletal: No chest wall abnormality. No acute or significant osseous findings. Review of the MIP images confirms the above findings. IMPRESSION: 1. Negative examination for pulmonary embolism. 2. Consolidations and volume loss in the bilateral lower lobes, favor atelectasis. However, aspiration/infection possible. 3. Small hiatal hernia. Electronically Signed   By: JDahlia BailiffMD   On: 01/22/2020 17:57   DG CHEST PORT 1 VIEW  Result Date: 01/21/2020 CLINICAL DATA:  Hypoxia, trouble  breathing since surgery on 01/19/2020 history asthma EXAM: PORTABLE CHEST 1 VIEW COMPARISON:  Portable exam 1217 hours compared to 02/23/2019 FINDINGS: Normal heart size, mediastinal contours, and pulmonary vascularity. Bibasilar atelectasis. Lungs otherwise clear. No pulmonary infiltrate, pleural effusion, or pneumothorax. IMPRESSION: Bibasilar atelectasis. Electronically Signed   By: MLavonia DanaM.D.   On: 01/21/2020 12:44        Scheduled Meds: . amLODipine  5 mg Oral Daily  . aspirin  81 mg Oral BID  . celecoxib  200 mg Oral BID  . docusate sodium  100 mg Oral BID  . famotidine  40 mg Oral QHS  . fluticasone furoate-vilanterol  1 puff Inhalation Daily   And  . umeclidinium bromide  1 puff Inhalation Daily  . LORazepam  1 mg Oral BID  . montelukast  10 mg Oral QHS  . pantoprazole  40 mg Oral Daily  . ticagrelor  60 mg Oral BID   Continuous Infusions: . methocarbamol (ROBAXIN) IV 500 mg (01/19/20 1407)     LOS: 1 day    Time spent:25 mins,  More than 50% of that time was spent in counseling and/or coordination of care.      Shelly Coss, MD [facility] P1/15/2022, 7:50 AM

## 2020-01-23 NOTE — Progress Notes (Signed)
Received referral to assist with home O2. Contacted Brenda Craig with [employer] for referral.

## 2020-01-23 NOTE — Progress Notes (Signed)
Discharge instructions given to patient and all questions were answered.  

## 2020-01-23 NOTE — Progress Notes (Signed)
     Subjective: 4 Days Post-Op s/p Procedure(s): TOTAL KNEE ARTHROPLASTY   Patient is alert, oriented. Patient reports pain as well controlled on dilaudid.   Denies chest pain, SOB, Calf pain. No nausea/vomiting. No other complaints.  Objective:  PE: VITALS:   Vitals:   01/22/20 2129 01/22/20 2145 01/23/20 0603 01/23/20 0942  BP: 91/63  100/66   Pulse: 92  85   Resp: 16  18   Temp: 98.2 F (36.8 C)  98.7 F (37.1 C)   TempSrc: Oral  Oral   SpO2: (!) 88% 92% (!) 89% (!) 82%  Weight:      Height:       General: alert, oriented, in no acute distress Abd: Soft, nontender, nondistended MSK:  Neurovascular intact Sensation intact distally Intact pulses distally Dorsiflexion/Plantar flexion intact Incision: scant drainage  LABS  No results found for this or any previous visit (from the past 24 hour(s)).  CT ANGIO CHEST PE W OR WO CONTRAST  Result Date: 01/22/2020 CLINICAL DATA:  PE suspected high probability, status post total knee revision with low blood pressure, positive D-dimer and shortness of breath. EXAM: CT ANGIOGRAPHY CHEST WITH CONTRAST TECHNIQUE: Multidetector CT imaging of the chest was performed using the standard protocol during bolus administration of intravenous contrast. Multiplanar CT image reconstructions and MIPs were obtained to evaluate the vascular anatomy. CONTRAST:  167m OMNIPAQUE IOHEXOL 350 MG/ML SOLN COMPARISON:  CT a chest May 21, 2016. FINDINGS: Cardiovascular: Satisfactory opacification of the pulmonary arteries to the segmental level. No evidence of pulmonary embolism. Normal heart size. No pericardial effusion. Mediastinum/Nodes: No enlarged mediastinal, hilar, or axillary lymph nodes. Thyroid gland and trachea demonstrate no significant findings. Small hiatal hernia. Lungs/Pleura: Consolidations and volume loss in the bilateral lower lobes. Upper Abdomen: Small accessory splenule. Right upper pole renal cyst. Musculoskeletal: No chest wall  abnormality. No acute or significant osseous findings. Review of the MIP images confirms the above findings. IMPRESSION: 1. Negative examination for pulmonary embolism. 2. Consolidations and volume loss in the bilateral lower lobes, favor atelectasis. However, aspiration/infection possible. 3. Small hiatal hernia. Electronically Signed   By: JDahlia BailiffMD   On: 01/22/2020 17:57   DG CHEST PORT 1 VIEW  Result Date: 01/21/2020 CLINICAL DATA:  Hypoxia, trouble breathing since surgery on 01/19/2020 history asthma EXAM: PORTABLE CHEST 1 VIEW COMPARISON:  Portable exam 1217 hours compared to 02/23/2019 FINDINGS: Normal heart size, mediastinal contours, and pulmonary vascularity. Bibasilar atelectasis. Lungs otherwise clear. No pulmonary infiltrate, pleural effusion, or pneumothorax. IMPRESSION: Bibasilar atelectasis. Electronically Signed   By: MLavonia DanaM.D.   On: 01/21/2020 12:44    Assessment/Plan: Left knee arthoplasty: 4 Days Post-Op s/p Procedure(s): TOTAL KNEE ARTHROPLASTY - WBAT LLE Pain: Oxycodone, dilaudid, pt unable to take muscle relaxer, NSAID's.  Weight Bearing: Weight Bearing as Tolerated (WBAT) LLE Dressings: Maintain Mepilex. VTE prophylaxis: Aspirin, SCDs, ambulation Dispo: Home  With HHPT.  Acute hypoxemia: - appreciate help from medicine team - recommending Augmentin to take at home and home oxygen at discharge for short term used is saturation remains less than 88% - patient now agreeable to home oxygen, will order for discharge   Contact information:   Weekdays 8-5 BMerlene Pulling PA-C [phone] fter hours and holidays please check [email or web address] for group call information for [facility]  BVentura Bruns1/15/2022, 10:53 AM

## 2020-01-25 ENCOUNTER — Encounter (HOSPITAL_COMMUNITY): Payer: Self-pay | Admitting: Orthopedic Surgery

## 2020-05-06 ENCOUNTER — Ambulatory Visit: Payer: BC Managed Care – PPO | Admitting: Pulmonary Disease

## 2020-05-09 ENCOUNTER — Other Ambulatory Visit: Payer: Self-pay

## 2020-05-09 ENCOUNTER — Ambulatory Visit: Payer: BC Managed Care – PPO

## 2020-05-09 ENCOUNTER — Ambulatory Visit (INDEPENDENT_AMBULATORY_CARE_PROVIDER_SITE_OTHER): Payer: BC Managed Care – PPO

## 2020-05-09 ENCOUNTER — Encounter: Payer: Self-pay | Admitting: Adult Health

## 2020-05-09 ENCOUNTER — Ambulatory Visit: Payer: BC Managed Care – PPO | Admitting: Adult Health

## 2020-05-09 VITALS — BP 120/84 | HR 98 | Temp 98.3°F | Ht 65.0 in | Wt 211.6 lb

## 2020-05-09 DIAGNOSIS — G4733 Obstructive sleep apnea (adult) (pediatric): Secondary | ICD-10-CM | POA: Diagnosis not present

## 2020-05-09 DIAGNOSIS — J4550 Severe persistent asthma, uncomplicated: Secondary | ICD-10-CM | POA: Diagnosis not present

## 2020-05-09 DIAGNOSIS — R053 Chronic cough: Secondary | ICD-10-CM

## 2020-05-09 DIAGNOSIS — J9611 Chronic respiratory failure with hypoxia: Secondary | ICD-10-CM

## 2020-05-09 MED ORDER — BENZONATATE 200 MG PO CAPS: 200.0000 mg | ORAL_CAPSULE | Freq: Three times a day (TID) | ORAL | 1 refills | Status: DC | PRN

## 2020-05-09 NOTE — Patient Instructions (Addendum)
Continue on BREZTRI 2 puffs Twice daily, rinse after use.  Set up for an overnight oximetry test on room air on CPAP .  Delsym 2 tsp Twice daily  As needed  Cough/congestion  Tessalon Three times a day  As needed  Cough .  Continue on CPAP At bedtime   Work on healthy weight loss.  Do not drive if sleepy  CPAP download .  Chest xray today today .  Follow up with Dr. Mortimer Fries in 6 months with PFT  Please contact office for sooner follow up if symptoms do not improve or worsen or seek emergency care

## 2020-05-09 NOTE — Progress Notes (Signed)
@Patient  ID: Brenda Craig, female    DOB: 08-18-1960, 60 y.o.   MRN: [id number]  Chief Complaint  Patient presents with  . 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  Previous cardiac arrest.  TEST/EVENTS :  CT chest January 22, 2020 negative for PE, no lymphadenopathy, consolidations and volume loss in the bilateral lower lobes  Critical illness December 2020 with urosepsis, septic shock,  kidney stones  05/09/2020 Follow up : Cough variant asthma and OSA  Patient returns for a follow-up visit.  Last seen July 2020.  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  She also has underlying obstructive sleep apnea is on nocturnal CPAP.  Patient says she wears her CPAP every night cannot sleep without it.  She feels rested and does well with her CPAP.  Patient says she has not been doing as well over the last few months.  She had knee surgery and January 2022.  Postop day 2 she had significant hypoxemia and tachycardia.  CT chest was negative for PE, positive for by basilar consolidation and volume loss.  She was treated for possible underlying pneumonia.  With IV antibiotics and transition to Augmentin on discharge.  She did require oxygen 2 L of oxygen at discharge.  Patient says she has slowly been improving.  She was seen recently by her primary care provider and walk test in the office showed no desaturations and has been off of daytime oxygen for several weeks.  She does currently use oxygen with her CPAP at bedtime.  It is being required by insurance to verify ongoing need. Patient says overall her chronic cough is doing about the same.  She has a minimally productive cough that comes and goes and is been going on  for years.  She denies any fever, chest pain, orthopnea or increased edema.   O2 saturations are 94% in the office on room air.  Allergies  Allergen Reactions  . Contrast Media [Iodinated Diagnostic Agents] Itching  . Other     Gluten allergy  . Codeine Nausea And Vomiting and Rash  . Nexium [Esomeprazole Magnesium] Palpitations  . Omeprazole Magnesium Palpitations  . Oxycodone-Acetaminophen Itching, Nausea And Vomiting and Rash  . Percocet [Oxycodone-Acetaminophen] Itching, Nausea And Vomiting and Rash    Can take with benadryl   . Tramadol Rash    Can take with benadryl     Immunization History  Administered Date(s) Administered  . Influenza,inj,Quad PF,6+ Mos 11/13/2017  . PFIZER Comirnaty(Gray Top)Covid-19 Tri-Sucrose Vaccine 05/06/2020  . PFIZER(Purple Top)SARS-COV-2 Vaccination 04/16/2019, 05/07/2019  . Pneumococcal Polysaccharide-23 12/16/2018    Past Medical History:  Diagnosis Date  . Abnormal Pap smear of cervix 1988  . Acid reflux 01/17/2013  . Allergic rhinitis   . Anemia   . Anginal pain ([place])   . Anxiety   . Arthritis   . Asthma   . Celiac disease   . Cervical dysplasia 1988  . CHF (congestive heart failure) ([place])   . Complication of anesthesia    bp drops after anesthesia and has to be kept overnight  . Coronary artery disease   . Heart trouble   . History of cardiac arrest   . History of kidney stones   . History of mammogram 03/25/2013; 12/07/14  BIRADS 1; NEG  . History of Papanicolaou smear of cervix 03/20/12; 12/28/15   -/-; -/-  . Hypercholesteremia   . Hypertension   . Menopausal symptoms   . MI (myocardial infarction) ([place])   . Mitral valve prolapse   . Pneumonia   . PONV (postoperative nausea and vomiting)    nausea only  . Sleep apnea   . Vulvovaginitis    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    Tobacco History: Social History   Tobacco Use  Smoking Status Never Smoker  Smokeless Tobacco Never Used    Counseling given: Not Answered   Outpatient Medications Prior to Visit  Medication Sig Dispense Refill  . acetaminophen (TYLENOL) 500 MG tablet Take 2 tablets (1,000 mg total) by mouth every 6 (six) hours. 30 tablet 0  . albuterol (VENTOLIN HFA) 108 (90 Base) MCG/ACT inhaler Inhale 1-2 puffs into the lungs every 4 (four) hours as needed for wheezing or shortness of breath.    Marland Kitchen amLODipine (NORVASC) 5 MG tablet Take 1 tablet (5 mg total) by mouth daily. (Patient taking differently: Take 5 mg by mouth every morning.) 30 tablet 0  . Budeson-Glycopyrrol-Formoterol (BREZTRI AEROSPHERE) 160-9-4.8 MCG/ACT AERO Inhale 2 puffs into the lungs daily.    . chlorpheniramine-HYDROcodone (TUSSIONEX) 10-8 MG/5ML SUER Take 5 mLs by mouth every 12 (twelve) hours as needed for cough.    . clotrimazole-betamethasone (LOTRISONE) cream APPLY TO AFFECTED AREA TWICE A DAY AS NEEDED FOR SYMPTOMS FOR UP TO 2 WEEKS (Patient taking differently: Apply 1 application topically 2 (two) times daily as needed (irritation). APPLY TO AFFECTED AREA TWICE A DAY AS NEEDED FOR SYMPTOMS FOR UP TO 2 WEEKS) 45 g 0  . [facility] ASPIRIN ADULT LOW DOSE 81 MG chewable tablet Chew 81 mg by mouth daily.    . diphenhydrAMINE (BENADRYL) 50 MG tablet Take 0.5-1 tablets (25-50 mg total) by mouth every 6 (six) hours as needed for itching (Take with oxycodone). 60 tablet 0  . diphenhydrAMINE (BENADRYL) 50 MG tablet Take 0.5 tablets (25 mg total) by mouth at bedtime as needed for itching (take with oxycodone). 30 tablet 0  . famotidine (PEPCID) 40 MG tablet Take 40 mg by mouth at bedtime.    Marland Kitchen LORazepam (ATIVAN) 1 MG tablet Take 1 mg by mouth 2 (two) times daily.    . montelukast (SINGULAIR) 10 MG tablet Take 10 mg by mouth at bedtime.    Marland Kitchen NITROSTAT 0.4 MG SL tablet Place 0.4 mg under the tongue every 5 (five) minutes as needed for chest pain.     Marland Kitchen ondansetron (ZOFRAN) 4 MG tablet Take 1 tablet (4 mg total) by mouth every 8 (eight) hours as needed for  nausea or vomiting. 30 tablet 1  . pantoprazole (PROTONIX) 40 MG tablet Take 40 mg by mouth every morning.    . ticagrelor (BRILINTA) 60 MG TABS tablet Take 60 mg by mouth 2 (two) times daily.     . pantoprazole (PROTONIX) 20 MG tablet Take 1 tablet (20 mg total) by mouth daily for 20 doses. 20 tablet 0  . amoxicillin (AMOXIL) 500 MG tablet Take 2,000 mg by mouth See admin instructions. Before dental procedures (Patient not taking: Reported on 05/09/2020)    . diclofenac Sodium (VOLTAREN) 1 % GEL Apply 1 application topically 4 (four) times daily as needed (pain). (Patient not taking: Reported on 05/09/2020)    . HYDROmorphone (DILAUDID) 2 MG tablet Take 1 tablet (2 mg total) by mouth every  4 (four) hours as needed for severe pain. (Patient not taking: Reported on 05/09/2020) 30 tablet 0  . methocarbamol (ROBAXIN) 500 MG tablet Take 1 tablet (500 mg total) by mouth every 8 (eight) hours as needed for muscle spasms. (Patient not taking: Reported on 05/09/2020) 30 tablet 0  . ondansetron (ZOFRAN) 4 MG tablet Take 1 tablet (4 mg total) by mouth every 6 (six) hours as needed for nausea. (Patient not taking: Reported on 05/09/2020) 20 tablet 0   No facility-administered medications prior to visit.     Review of Systems:   Constitutional:   No  weight loss, night sweats,  Fevers, chills,  +fatigue, or  lassitude.  HEENT:   No headaches,  Difficulty swallowing,  Tooth/dental problems, or  Sore throat,                No sneezing, itching, ear ache, nasal congestion, post nasal drip,   CV:  No chest pain,  Orthopnea, PND, swelling in lower extremities, anasarca, dizziness, palpitations, syncope.   GI  No heartburn, indigestion, abdominal pain, nausea, vomiting, diarrhea, change in bowel habits, loss of appetite, bloody stools.   Resp:    No coughing up of blood.  No change in color of mucus.  No wheezing.  No chest wall deformity  Skin: no rash or lesions.  GU: no dysuria, change in color of urine, no  urgency or frequency.  No flank pain, no hematuria   MS:  No joint pain or swelling.  No decreased range of motion.  No back pain.    Physical Exam  BP 120/84 (BP Location: Left Arm, Patient Position: Sitting, Cuff Size: Large)   Pulse 98   Temp 98.3 F (36.8 C) (Temporal)   Ht 5' 5"  (1.651 m)   Wt 211 lb 9.6 oz (96 kg)   SpO2 94%   BMI 35.21 kg/m   GEN: A/Ox3; pleasant , NAD, well nourished    HEENT:  [place]/AT,    NOSE-clear, THROAT-clear, no lesions, no postnasal drip or exudate noted.   NECK:  Supple w/ fair ROM; no JVD; normal carotid impulses w/o bruits; no thyromegaly or nodules palpated; no lymphadenopathy.    RESP  Clear  P & A; w/o, wheezes/ rales/ or rhonchi. no accessory muscle use, no dullness to percussion  CARD:  RRR, no m/r/g, no peripheral edema, pulses intact, no cyanosis or clubbing.  GI:   Soft & nt; nml bowel sounds; no organomegaly or masses detected.   Musco: Warm bil, no deformities or joint swelling noted.   Neuro: alert, no focal deficits noted.    Skin: Warm, no lesions or rashes    Lab Results:  CBC  BMET  ProBNP No results found for: PROBNP  Imaging: No results found.    No flowsheet data found.  No results found for: NITRICOXIDE      Assessment & Plan:   No problem-specific Assessment & Plan notes found for this encounter.     Rexene Edison, NP 05/09/2020

## 2020-05-10 DIAGNOSIS — J189 Pneumonia, unspecified organism: Secondary | ICD-10-CM | POA: Insufficient documentation

## 2020-05-10 DIAGNOSIS — G4733 Obstructive sleep apnea (adult) (pediatric): Secondary | ICD-10-CM | POA: Insufficient documentation

## 2020-05-10 DIAGNOSIS — J9611 Chronic respiratory failure with hypoxia: Secondary | ICD-10-CM | POA: Insufficient documentation

## 2020-05-10 HISTORY — DX: Pneumonia, unspecified organism: J18.9

## 2020-05-10 HISTORY — DX: Chronic respiratory failure with hypoxia: J96.11

## 2020-05-10 NOTE — Assessment & Plan Note (Signed)
Chronic respiratory failure after knee surgery in January-patient has a postop hypoxemia and probable pneumonia.  She has been clinically improving over the last few months.  Now with no daytime hypoxemia.  We will check overnight oximetry test on room air.  Patient is instructed to wear her CPAP but with no oxygen.  If this shows no significant desaturations will discontinue oxygen.

## 2020-05-10 NOTE — Assessment & Plan Note (Signed)
Chronic cough versus cough variant asthma Currently at baseline.  Continue with current maintenance regimen-withBREZTRI  Check PFTs on return. May use cough suppression regimen with Delsym and Tessalon as needed  Plan  Patient Instructions  Continue on BREZTRI 2 puffs Twice daily, rinse after use.  Set up for an overnight oximetry test on room air on CPAP .  Delsym 2 tsp Twice daily  As needed  Cough/congestion  Tessalon Three times a day  As needed  Cough .  Continue on CPAP At bedtime   Work on healthy weight loss.  Do not drive if sleepy  CPAP download .  Chest xray today today .  Follow up with Dr. Mortimer Fries in 6 months with PFT  Please contact office for sooner follow up if symptoms do not improve or worsen or seek emergency care

## 2020-05-10 NOTE — Assessment & Plan Note (Signed)
Bilateral pneumonia-January 2022 after knee surgery.  Patient is clinically improved after antibiotics.  We will check chest x-ray today.

## 2020-05-10 NOTE — Assessment & Plan Note (Signed)
Obstructive sleep apnea with excellent compliance on nocturnal CPAP.  Reports good benefit.  We will check CPAP daily. Plan  Patient Instructions  Continue on BREZTRI 2 puffs Twice daily, rinse after use.  Set up for an overnight oximetry test on room air on CPAP .  Delsym 2 tsp Twice daily  As needed  Cough/congestion  Tessalon Three times a day  As needed  Cough .  Continue on CPAP At bedtime   Work on healthy weight loss.  Do not drive if sleepy  CPAP download .  Chest xray today today .  Follow up with Dr. Mortimer Fries in 6 months with PFT  Please contact office for sooner follow up if symptoms do not improve or worsen or seek emergency care

## 2020-05-12 ENCOUNTER — Telehealth: Payer: Self-pay | Admitting: Adult Health

## 2020-05-12 DIAGNOSIS — G4733 Obstructive sleep apnea (adult) (pediatric): Secondary | ICD-10-CM

## 2020-05-12 NOTE — Telephone Encounter (Signed)
No sign of residual pneumonia. Continue with office visit recs  Please contact office for sooner follow up if symptoms do not improve or worsen or seek emergency care   This was per TP.    I have called the pt and she is aware of results.    She was asking about the ONO on room air.  She stated that it is about time for her to recertify for her oxygen and if she doesn't need it she would like to turn it in.  I have placed the order for the ONO for the pt to do this on her cpap with no oxygen.  Pt is aware of this.

## 2020-05-12 NOTE — Progress Notes (Signed)
ATC x2, left VM to return call regarding CXR.

## 2020-05-24 ENCOUNTER — Telehealth: Payer: Self-pay | Admitting: Adult Health

## 2020-05-24 NOTE — Telephone Encounter (Signed)
I have spoke with the patient and Cpap is through [facility] and 02 is through [facility] [place]. I will call [facility] about the ONO orders and send cpap supply order to [facility] in the morning

## 2020-05-24 NOTE — Telephone Encounter (Signed)
Spoke to patient, who is calling for update on cpap supplies  and ONO.  Per our records, order was sent to [facility] for cpap supplies. Patient stated that order should be sent to [facility] has not contacted patient regarding ONO. Order was placed 05/12/2020.  Rodena Piety, please advise. Thanks

## 2020-05-27 NOTE — Telephone Encounter (Signed)
Brenda Craig, did you ever touch base with [facility]? Thanks

## 2020-05-27 NOTE — Telephone Encounter (Signed)
I spoke with Wells Guiles with [employer] on 05/26/20 and they do have the ONO order to process. I have now spoke with Hayley with [facility] and she states that they have the order to renew cpap supplies but have now faxed the CMN for Brenda Craig to sign

## 2020-06-17 ENCOUNTER — Telehealth: Payer: Self-pay | Admitting: Adult Health

## 2020-06-17 ENCOUNTER — Other Ambulatory Visit: Payer: Self-pay | Admitting: Adult Health

## 2020-06-17 NOTE — Telephone Encounter (Signed)
disregard

## 2020-06-27 ENCOUNTER — Other Ambulatory Visit: Payer: Self-pay | Admitting: Internal Medicine

## 2020-06-27 ENCOUNTER — Ambulatory Visit
Admission: RE | Admit: 2020-06-27 | Discharge: 2020-06-27 | Disposition: A | Discharge status: Home / Self Care | Payer: BC Managed Care – PPO | Attending: Internal Medicine | Admitting: Internal Medicine

## 2020-06-27 ENCOUNTER — Other Ambulatory Visit: Payer: Self-pay

## 2020-06-27 ENCOUNTER — Ambulatory Visit
Admission: RE | Admit: 2020-06-27 | Discharge: 2020-06-27 | Disposition: A | Discharge status: Home / Self Care | Payer: BC Managed Care – PPO | Source: Ambulatory Visit | Attending: Internal Medicine | Admitting: Internal Medicine

## 2020-06-27 DIAGNOSIS — R059 Cough, unspecified: Secondary | ICD-10-CM

## 2020-07-13 NOTE — Progress Notes (Signed)
PCP: Albina Billet, MD   Chief Complaint  Patient presents with   Gynecologic Exam    No concerns    HPI:      [name] is a 60 y.o. [id number] who LMP was No LMP recorded. Patient has had an ablation., presents today for her annual examination.  Her menses are absent due to ablation and she is postmenopausal. No PMB. She does have tolerable vasomotor sx.   Sex activity: not sexually active. She does not have vaginal dryness. Does get vaginal irritation/fissures due to moisture. Treats with lotrisone crm ext with relief.   Last Pap: December 28, 2015  Results were: no abnormalities /neg HPV DNA.  Hx of STDs: HPV on pap  Last mamm[facility]: 08/29/17 at [facility].  Results were: normal--routine follow-up in 12 months There is no FH of breast cancer. There is no FH of ovarian cancer. The patient does do self-breast exams.  Colonoscopy: colonoscopy 7 years ago with abnormalities.  Repeat due after 5 yrs but not done due to covid/knee surg, etc.  Pt had done at [facility].   Tobacco use: The patient denies current or previous tobacco use. Alcohol use: none No drug use Exercise: not active  She does not get adequate calcium or Vitamin D in her diet.  Labs with PCP.  She cont to get fungal infections under panus/bilat ing area. She tries to keep area dry but sx are intermittent. She uses gold bond powder to help with moisture control and treats prn with lotrisone crm.   Past Medical History:  Diagnosis Date   Abnormal Pap smear of cervix 1988   Acid reflux 01/17/2013   Allergic rhinitis    Anemia    Anginal pain ([facility])    Anxiety    Arthritis    Asthma    Celiac disease    Cervical dysplasia 1988   CHF (congestive heart failure) ([facility])    Complication of anesthesia    bp drops after anesthesia and has to be kept overnight   Coronary artery disease    Heart trouble    History of cardiac arrest    History of kidney stones    History of mammogram 03/25/2013; 12/07/14   BIRADS 1;  NEG   History of Papanicolaou smear of cervix 03/20/12; 12/28/15   -/-; -/-   Hypercholesteremia    Hypertension    Menopausal symptoms    MI (myocardial infarction) ([place])    Mitral valve prolapse    Pneumonia    PONV (postoperative nausea and vomiting)    nausea only   Sleep apnea    Vulvovaginitis    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    Past Surgical History:  Procedure Laterality Date   ABLATION  2013   [facility]   BACK SURGERY     BACK SURGERY  2016; 2017   L4, L5   CARDIAC SURGERY  03/19/12; 10/2014   HEART CATH AND STENT   CESAREAN SECTION     1986; [place]  08/2010   16 POLYPS (ALL BENIGN) DX C CELIAC DISEASE   COMBINED HYSTEROSCOPY DIAGNOSTIC / D&C  2013   [facility]   CYSTOSCOPY W/ RETROGRADES Left 01/20/2019   Procedure: CYSTOSCOPY WITH RETROGRADE PYELOGRAM;  Surgeon: Abbie Sons, MD;  Location: [facility] ORS;  Service: Urology;  Laterality: Left;   CYSTOSCOPY W/ URETERAL STENT PLACEMENT Left 12/13/2018   Procedure: CYSTOSCOPY WITH  RETROGRADE PYELOGRAM/URETERAL STENT PLACEMENT;  Surgeon: Irine Seal, MD;  Location: [facility] OR[facility];  Service: Urology;  Laterality: Left;   CYSTOSCOPY/URETEROSCOPY/HOLMIUM LASER/STENT PLACEMENT Left 01/20/2019   Procedure: CYSTOSCOPY/URETEROSCOPY/HOLMIUM LASER/STENT EXCHANGE;  Surgeon: Abbie Sons, MD;  Location: [facility] ORS;  Service: Urology;  Laterality: Left;   DILATION AND CURETTAGE OF UTERUS  2001   ESOPHAGOGASTRODUODENOSCOPY  08/2010   DX C CELIAC DISEASE   HEART STENTS  05/27/2011   HIP SURGERY  2016   RIGHT IT BAND AND BURSA REMOVAL   OOPHORECTOMY Right 1982   TOTAL KNEE ARTHROPLASTY Left 01/19/2020   Procedure: TOTAL KNEE ARTHROPLASTY;  Surgeon: Renette Butters, MD;  Location: [facility];  Service: Orthopedics;  Laterality: Left;    Family History  Problem Relation Age of Onset   CAD Father    Transient ischemic attack Father    Diabetes Father    Cerebrovascular Accident  Father    Heart disease Father        M-MVP; F BETA;BLOCKERS   Hypothyroidism Father    Hyperthyroidism Mother    Cancer Maternal Uncle        KIDNEY   Uterine cancer Maternal Aunt 69    Social History   Socioeconomic History   Marital status: Divorced    Spouse name: Not on file   Number of children: 2   Years of education: 16   Highest education level: Not on file  Occupational History   Occupation: TEACHER  Tobacco Use   Smoking status: Never   Smokeless tobacco: Never  Vaping Use   Vaping Use: Never used  Substance and Sexual Activity   Alcohol use: No   Drug use: No   Sexual activity: Not Currently    Birth control/protection: Surgical    Comment: ABLATION  Other Topics Concern   Not on file  Social History Narrative   Not on file   Social Determinants of Health   Financial Resource Strain: Not on file  Food Insecurity: Not on file  Transportation Needs: Not on file  Physical Activity: Not on file  Stress: Not on file  Social Connections: Not on file  Intimate Partner Violence: Not on file    Current Meds  Medication Sig   albuterol (VENTOLIN HFA) 108 (90 Base) MCG/ACT inhaler Inhale 1-2 puffs into the lungs every 4 (four) hours as needed for wheezing or shortness of breath.   amLODipine (NORVASC) 5 MG tablet Take 1 tablet (5 mg total) by mouth daily. (Patient taking differently: Take 5 mg by mouth every morning.)   amoxicillin-clavulanate (AUGMENTIN) 875-125 MG tablet Take 1 tablet by mouth 2 (two) times daily.   benzonatate (TESSALON) 200 MG capsule TAKE 1 CAPSULE (200 MG TOTAL) BY MOUTH 3 (THREE) TIMES DAILY AS NEEDED FOR COUGH.   Budeson-Glycopyrrol-Formoterol (BREZTRI AEROSPHERE) 160-9-4.8 MCG/ACT AERO Inhale 2 puffs into the lungs daily.   chlorpheniramine-HYDROcodone (TUSSIONEX) 10-8 MG/5ML SUER Take 5 mLs by mouth every 12 (twelve) hours as needed for cough.   [facility] ASPIRIN ADULT LOW DOSE 81 MG chewable tablet Chew 81 mg by mouth daily.    diphenhydrAMINE (BENADRYL) 50 MG tablet Take 0.5-1 tablets (25-50 mg total) by mouth every 6 (six) hours as needed for itching (Take with oxycodone).   famotidine (PEPCID) 40 MG tablet Take 40 mg by mouth at bedtime.   fluticasone (FLONASE) 50 MCG/ACT nasal spray Place 2 sprays into both nostrils daily.   LORazepam (ATIVAN) 1 MG tablet Take 1 mg by mouth 2 (two) times daily.  montelukast (SINGULAIR) 10 MG tablet Take 10 mg by mouth at bedtime.   NITROSTAT 0.4 MG SL tablet Place 0.4 mg under the tongue every 5 (five) minutes as needed for chest pain.    pantoprazole (PROTONIX) 40 MG tablet Take 40 mg by mouth every morning.   predniSONE (DELTASONE) 10 MG tablet Take by mouth.   rosuvastatin (CRESTOR) 10 MG tablet Take 10 mg by mouth at bedtime.   ticagrelor (BRILINTA) 60 MG TABS tablet Take 60 mg by mouth 2 (two) times daily.    [DISCONTINUED] clotrimazole-betamethasone (LOTRISONE) cream APPLY TO AFFECTED AREA TWICE A DAY AS NEEDED FOR SYMPTOMS FOR UP TO 2 WEEKS (Patient taking differently: Apply 1 application topically 2 (two) times daily as needed (irritation). APPLY TO AFFECTED AREA TWICE A DAY AS NEEDED FOR SYMPTOMS FOR UP TO 2 WEEKS)      ROS:  Review of Systems  Constitutional:  Negative for fatigue, fever and unexpected weight change.  Respiratory:  Positive for cough and wheezing. Negative for shortness of breath.   Cardiovascular:  Negative for chest pain, palpitations and leg swelling.  Gastrointestinal:  Negative for blood in stool, constipation, diarrhea, nausea and vomiting.  Endocrine: Negative for cold intolerance, heat intolerance and polyuria.  Genitourinary:  Negative for dyspareunia, dysuria, flank pain, frequency, genital sores, hematuria, menstrual problem, pelvic pain, urgency, vaginal bleeding, vaginal discharge and vaginal pain.  Musculoskeletal:  Negative for back pain, joint swelling and myalgias.  Skin:  Positive for rash.  Neurological:  Negative for dizziness,  syncope, light-headedness, numbness and headaches.  Hematological:  Negative for adenopathy. Bruises/bleeds easily.  Psychiatric/Behavioral:  Negative for agitation, confusion, sleep disturbance and suicidal ideas. The patient is not nervous/anxious.     Objective: BP 110/70   Ht 5' 5"  (1.651 m)   Wt 214 lb (97.1 kg)   BMI 35.61 kg/m    Physical Exam Constitutional:      Appearance: She is well-developed.  Genitourinary:     Vulva normal.     Right Labia: No rash, tenderness or lesions.    Left Labia: No tenderness, lesions or rash.    No vaginal discharge, erythema or tenderness.      Right Adnexa: not tender and no mass present.    Left Adnexa: not tender and no mass present.    No cervical friability or polyp.     Uterus is not enlarged or tender.  Breasts:    Right: No mass, nipple discharge, skin change or tenderness.     Left: No mass, nipple discharge, skin change or tenderness.  Neck:     Thyroid: No thyromegaly.  Cardiovascular:     Rate and Rhythm: Normal rate and regular rhythm.     Heart sounds: Normal heart sounds. No murmur heard. Pulmonary:     Effort: Pulmonary effort is normal.     Breath sounds: Normal breath sounds.  Abdominal:     Palpations: Abdomen is soft.     Tenderness: There is no abdominal tenderness. There is no guarding or rebound.  Musculoskeletal:        General: Normal range of motion.     Cervical back: Normal range of motion.  Lymphadenopathy:     Cervical: No cervical adenopathy.  Neurological:     General: No focal deficit present.     Mental Status: She is alert and oriented to person, place, and time.     Cranial Nerves: No cranial nerve deficit.  Skin:    General: Skin is warm and  dry.  Psychiatric:        Mood and Affect: Mood normal.        Behavior: Behavior normal.        Thought Content: Thought content normal.        Judgment: Judgment normal.  Vitals reviewed.    Assessment/Plan: Encounter for annual routine  gynecological examination  Cervical cancer screening - Plan: Cytology - PAP  Screening for HPV (human papillomavirus) - Plan: Cytology - PAP  Encounter for screening mammogram for malignant neoplasm of breast - Plan: MM 3D SCREEN BREAST BILATERAL; pt [facility] sched [facility]  Screening for colon cancer - Plan: Ambulatory referral to Gastroenterology; refer back to [facility] for scr colonoscopy  Dermatitis - Plan: clotrimazole-betamethasone (LOTRISONE) cream; Rx RF. No vaginal fissures/derm today.   Meds ordered this encounter  Medications   clotrimazole-betamethasone (LOTRISONE) cream    Sig: APPLY TO AFFECTED AREA TWICE A DAY AS NEEDED FOR SYMPTOMS FOR UP TO 2 WEEKS    Dispense:  45 g    Refill:  1    Order Specific Question:   Supervising Provider    Answer:   Gae Dry [[id number]]          GYN counsel breast self exam, mammography screening, menopause, adequate intake of calcium and vitamin D, diet and exercise    F/U  Return in about 1 year (around 07/14/2021).  Jaide Hillenburg B. Miata Culbreth, PA-C 07/14/2020 10:37 AM

## 2020-07-14 ENCOUNTER — Encounter: Payer: Self-pay | Admitting: Obstetrics and Gynecology

## 2020-07-14 ENCOUNTER — Other Ambulatory Visit (HOSPITAL_COMMUNITY)
Admission: RE | Admit: 2020-07-14 | Discharge: 2020-07-14 | Disposition: A | Discharge status: Home / Self Care | Payer: BC Managed Care – PPO | Source: Ambulatory Visit | Attending: Obstetrics and Gynecology | Admitting: Obstetrics and Gynecology

## 2020-07-14 ENCOUNTER — Other Ambulatory Visit: Payer: Self-pay

## 2020-07-14 ENCOUNTER — Ambulatory Visit (INDEPENDENT_AMBULATORY_CARE_PROVIDER_SITE_OTHER): Payer: BC Managed Care – PPO | Admitting: Obstetrics and Gynecology

## 2020-07-14 VITALS — BP 110/70 | Ht 65.0 in | Wt 214.0 lb

## 2020-07-14 DIAGNOSIS — Z124 Encounter for screening for malignant neoplasm of cervix: Secondary | ICD-10-CM | POA: Diagnosis not present

## 2020-07-14 DIAGNOSIS — Z1151 Encounter for screening for human papillomavirus (HPV): Secondary | ICD-10-CM

## 2020-07-14 DIAGNOSIS — Z1211 Encounter for screening for malignant neoplasm of colon: Secondary | ICD-10-CM

## 2020-07-14 DIAGNOSIS — Z1231 Encounter for screening mammogram for malignant neoplasm of breast: Secondary | ICD-10-CM | POA: Diagnosis not present

## 2020-07-14 DIAGNOSIS — Z01419 Encounter for gynecological examination (general) (routine) without abnormal findings: Secondary | ICD-10-CM | POA: Diagnosis not present

## 2020-07-14 DIAGNOSIS — L309 Dermatitis, unspecified: Secondary | ICD-10-CM

## 2020-07-14 MED ORDER — CLOTRIMAZOLE-BETAMETHASONE 1-0.05 % EX CREA: TOPICAL_CREAM | CUTANEOUS | 1 refills | Status: DC

## 2020-07-14 NOTE — Patient Instructions (Signed)
I value your feedback and you entrusting us with your care. If you get a [facility] patient survey, I would appreciate you taking the time to let us know about your experience today. Thank you!   [facility]: [phone]  

## 2020-07-18 LAB — CYTOLOGY - PAP
Adequacy: ABSENT
Comment: NEGATIVE
Diagnosis: NEGATIVE
High risk HPV: NEGATIVE

## 2020-12-06 ENCOUNTER — Other Ambulatory Visit: Payer: Self-pay | Admitting: Gastroenterology

## 2020-12-06 DIAGNOSIS — R1011 Right upper quadrant pain: Secondary | ICD-10-CM

## 2020-12-12 ENCOUNTER — Ambulatory Visit: Payer: BC Managed Care – PPO | Admitting: Cardiovascular Disease

## 2020-12-14 ENCOUNTER — Ambulatory Visit: Payer: BC Managed Care – PPO

## 2021-01-03 ENCOUNTER — Other Ambulatory Visit: Payer: Self-pay

## 2021-01-03 ENCOUNTER — Ambulatory Visit
Admission: RE | Admit: 2021-01-03 | Discharge: 2021-01-03 | Disposition: A | Discharge status: Home / Self Care | Payer: BC Managed Care – PPO | Source: Ambulatory Visit | Attending: Gastroenterology | Admitting: Gastroenterology

## 2021-01-03 DIAGNOSIS — R1011 Right upper quadrant pain: Secondary | ICD-10-CM | POA: Diagnosis not present

## 2021-01-03 DIAGNOSIS — R1012 Left upper quadrant pain: Secondary | ICD-10-CM | POA: Insufficient documentation

## 2021-01-13 ENCOUNTER — Encounter: Payer: Self-pay | Admitting: Cardiovascular Disease

## 2021-01-13 ENCOUNTER — Other Ambulatory Visit: Payer: Self-pay

## 2021-01-13 ENCOUNTER — Ambulatory Visit: Payer: BC Managed Care – PPO | Admitting: Cardiovascular Disease

## 2021-01-13 VITALS — BP 120/80 | HR 88 | Ht 65.0 in | Wt 221.4 lb

## 2021-01-13 DIAGNOSIS — I10 Essential (primary) hypertension: Secondary | ICD-10-CM | POA: Diagnosis not present

## 2021-01-13 DIAGNOSIS — E782 Mixed hyperlipidemia: Secondary | ICD-10-CM

## 2021-01-13 DIAGNOSIS — I25118 Atherosclerotic heart disease of native coronary artery with other forms of angina pectoris: Secondary | ICD-10-CM

## 2021-01-13 MED ORDER — LOSARTAN POTASSIUM 25 MG PO TABS: 25.0000 mg | ORAL_TABLET | Freq: Every day | ORAL | 3 refills | Status: DC

## 2021-01-13 NOTE — Progress Notes (Signed)
Cardiology Office Note  Date:  01/13/2021   ID:  MARINA BOERNER, DOB 01-Jan-1961, MRN [id number]  PCP:  Albina Billet, MD   Chief Complaint  Patient presents with   New Patient (Initial Visit)    Self ref to establish care for CAD; former patient at [facility]. Patient c/o shortness of breath at times with occasional angina. Medications reviewed by the patient verbally.     HPI:  Mrs. Dakisha Schoof is a 61 year old woman with past medical history of Back pain (angfinal equiv) Coronary artery disease ---ST elevation myocardial infarction complicated by ventricular fibrillation requiring circumflex coronary artery stent May 2013.  ---LAD stenting March 2014.   ---Catheterization 10/18/2014 showed patent stents with 50% mid LAD stenosis with negative FFR.  Who presents by referral from Dr. Hall Busing for consultation of her coronary disease  [facility] Last seen 2021  Has been in her usual state of health, visiting [facility] developed chest pain November 2022 Went to the hospital, had stress testing was all results as detailed below, no high risk ischemia  Active at baseline, weight running high,  On aspirin, previously on Brilinta for 6 to 7 years  Typical anginal symptoms include back pain  Other testing reviewed December 2017 Cardiolite scan negative for ischemia.  Ejection fraction 75%.   -She has had no recent angina with no use of nitroglycerin in the last year.   Prior EKGs -ECG today shows NSR. Low voltage QRS. Old inferior MI with small Q waves. Abnormal EKG. HR 81 BPM. QTc 441 ms.   -Last LDL was 44 in May 2021, with goal LDL less than 70. Excellent control of lipids with Crestor 10 mg daily.  Went to [place]: chest pain, Stress test: 11/28/20 CONCLUSION:  1. Normal Lexiscan Myoview stress test without significant inducible myocardial ischemia  2. Normal left ventricular systolic function.     urosepsis and developed Covid  infection during the hospitalization. She has fully recovered and also has received both doses of the COVID-19 vaccine  Scheduled for EGD /COLO  BP at home 120-135  EKG personally reviewed by myself on todays visit Normal sinus rhythm rate 88 bpm old inferior MI, no ST or T wave changes   PMH:   has a past medical history of Abnormal Pap smear of cervix (1988), Acid reflux (01/17/2013), Allergic rhinitis, Anemia, Anginal pain ([place]), Anxiety, Arthritis, Asthma, Celiac disease, Cervical dysplasia (1988), CHF (congestive heart failure) ([place]), Complication of anesthesia, Coronary artery disease, Heart trouble, History of cardiac arrest, History of kidney stones, History of mammogram (03/25/2013; 12/07/14), History of Papanicolaou smear of cervix (03/20/12; 12/28/15), Hypercholesteremia, Hypertension, Menopausal symptoms, MI (myocardial infarction) ([place]), Mitral valve prolapse, Pneumonia, PONV (postoperative nausea and vomiting), Sleep apnea, and Vulvovaginitis.  PSH:    Past Surgical History:  Procedure Laterality Date   ABLATION  2013   [facility]   BACK SURGERY     BACK SURGERY  2016; 2017   L4, L5   CARDIAC SURGERY  03/19/12; 10/2014   HEART CATH AND STENT   CESAREAN SECTION     1986; [place]   COLONOSCOPY  08/2010   16 POLYPS (ALL BENIGN) DX C CELIAC DISEASE   COMBINED HYSTEROSCOPY DIAGNOSTIC / D&C  2013   [facility]   CYSTOSCOPY W/ RETROGRADES Left 01/20/2019   Procedure: CYSTOSCOPY WITH RETROGRADE PYELOGRAM;  Surgeon: Abbie Sons, MD;  Location: [facility] ORS;  Service: Urology;  Laterality: Left;  CYSTOSCOPY W/ URETERAL STENT PLACEMENT Left 12/13/2018   Procedure: CYSTOSCOPY WITH RETROGRADE PYELOGRAM/URETERAL STENT PLACEMENT;  Surgeon: Irine Seal, MD;  Location: [facility] OR[facility];  Service: Urology;  Laterality: Left;   CYSTOSCOPY/URETEROSCOPY/HOLMIUM LASER/STENT PLACEMENT Left 01/20/2019   Procedure: CYSTOSCOPY/URETEROSCOPY/HOLMIUM LASER/STENT EXCHANGE;  Surgeon: Abbie Sons, MD;  Location: [facility] ORS;  Service: Urology;  Laterality: Left;   DILATION AND CURETTAGE OF UTERUS  2001   ESOPHAGOGASTRODUODENOSCOPY  08/2010   DX C CELIAC DISEASE   HEART STENTS  05/27/2011   HIP SURGERY  2016   RIGHT IT BAND AND BURSA REMOVAL   OOPHORECTOMY Right 1982   TOTAL KNEE ARTHROPLASTY Left 01/19/2020   Procedure: TOTAL KNEE ARTHROPLASTY;  Surgeon: Renette Butters, MD;  Location: [facility];  Service: Orthopedics;  Laterality: Left;    Current Outpatient Medications  Medication Sig Dispense Refill   albuterol (VENTOLIN HFA) 108 (90 Base) MCG/ACT inhaler Inhale 1-2 puffs into the lungs every 4 (four) hours as needed for wheezing or shortness of breath.     benzonatate (TESSALON) 200 MG capsule TAKE 1 CAPSULE (200 MG TOTAL) BY MOUTH 3 (THREE) TIMES DAILY AS NEEDED FOR COUGH. 30 capsule 11   Budeson-Glycopyrrol-Formoterol (BREZTRI AEROSPHERE) 160-9-4.8 MCG/ACT AERO Inhale 2 puffs into the lungs daily.     chlorpheniramine-HYDROcodone (TUSSIONEX) 10-8 MG/5ML SUER Take 5 mLs by mouth every 12 (twelve) hours as needed for cough.     clotrimazole-betamethasone (LOTRISONE) cream APPLY TO AFFECTED AREA TWICE A DAY AS NEEDED FOR SYMPTOMS FOR UP TO 2 WEEKS 45 g 1   [employer] ASPIRIN ADULT LOW DOSE 81 MG chewable tablet Chew 81 mg by mouth daily.     diphenhydrAMINE (BENADRYL) 50 MG tablet Take 0.5-1 tablets (25-50 mg total) by mouth every 6 (six) hours as needed for itching (Take with oxycodone). 60 tablet 0   ergocalciferol (VITAMIN D2) 1.25 MG (50000 UT) capsule Take 1 capsule by mouth once a week.     famotidine (PEPCID) 40 MG tablet Take 40 mg by mouth at bedtime.     ferrous sulfate 325 (65 FE) MG tablet Take 325 mg by mouth every morning.     fluticasone (FLONASE) 50 MCG/ACT nasal spray Place 2 sprays into both nostrils daily.     LORazepam (ATIVAN) 1 MG tablet Take 1 mg by mouth 2 (two) times daily.     losartan (COZAAR) 25 MG tablet Take 1 tablet (25 mg total) by mouth daily. 90 tablet 3    NITROSTAT 0.4 MG SL tablet Place 0.4 mg under the tongue every 5 (five) minutes as needed for chest pain.      pantoprazole (PROTONIX) 40 MG tablet Take 40 mg by mouth every morning.     rosuvastatin (CRESTOR) 10 MG tablet Take 10 mg by mouth at bedtime.     No current facility-administered medications for this visit.     Allergies:   Contrast media [iodinated contrast media], Other, Codeine, Nexium [esomeprazole magnesium], Omeprazole magnesium, Oxycodone-acetaminophen, Percocet [oxycodone-acetaminophen], and Tramadol   Social History:  The patient  reports that she has never smoked. She has never used smokeless tobacco. She reports that she does not drink alcohol and does not use drugs.   Family History:   family history includes CAD in her father; Cancer in her maternal uncle; Cerebrovascular Accident in her father; Diabetes in her father; Heart disease in her father; Hyperthyroidism in her mother; Hypothyroidism in her father; Transient ischemic attack in her father; Uterine cancer (age of onset: 43) in her maternal  aunt.    Review of Systems: Review of Systems  Constitutional: Negative.   HENT: Negative.    Respiratory: Negative.    Cardiovascular: Negative.   Gastrointestinal: Negative.   Musculoskeletal: Negative.   Neurological: Negative.   Psychiatric/Behavioral: Negative.    All other systems reviewed and are negative.   PHYSICAL EXAM: VS:  BP 120/80 (BP Location: Right Arm, Patient Position: Sitting, Cuff Size: Normal)    Pulse 88    Ht 5\' 5"  (1.651 m)    Wt 221 lb 6 oz (100.4 kg)    SpO2 95%    BMI 36.84 kg/m  , BMI Body mass index is 36.84 kg/m. GEN: Well nourished, well developed, in no acute distress HEENT: normal Neck: no JVD, carotid bruits, or masses Cardiac: RRR; no murmurs, rubs, or gallops,no edema  Respiratory:  clear to auscultation bilaterally, normal work of breathing GI: soft, nontender, nondistended, + BS MS: no deformity or atrophy Skin: warm and  dry, no rash Neuro:  Strength and sensation are intact Psych: euthymic mood, full affect    Recent Labs: 01/22/2020: B Natriuretic Peptide 40.0 01/23/2020: BUN 18; Creatinine, Ser 0.97; Hemoglobin 10.9; Platelets 262; Potassium 3.4; Sodium 139    Lipid Panel Lab Results  Component Value Date   CHOL 106 08/22/2015   HDL 42 08/22/2015   LDLCALC 43 08/22/2015   TRIG 107 08/22/2015      Wt Readings from Last 3 Encounters:  01/13/21 221 lb 6 oz (100.4 kg)  07/14/20 214 lb (97.1 kg)  05/09/20 211 lb 9.6 oz (96 kg)       ASSESSMENT AND PLAN:  Problem List Items Addressed This Visit       Cardiology Problems   Essential hypertension   Relevant Medications   losartan (COZAAR) 25 MG tablet   Other Relevant Orders   EKG 12-Lead   Hyperlipidemia   Relevant Medications   losartan (COZAAR) 25 MG tablet   Other Visit Diagnoses     Coronary artery disease of native artery of native heart with stable angina pectoris ([place])    -  Primary   Relevant Medications   losartan (COZAAR) 25 MG tablet   Other Relevant Orders   EKG 12-Lead      Coronary disease with stable angina Stable symptoms, recent concern for angina November 2022 with stress test showing no high risk ischemia No further testing at this time Non-smoker, no diabetes, we have requested lipid panel from Dr. Hall Busing Typical anginal symptoms include back pain  Hyperlipidemia Continue Crestor 10, goal LDL less than 55  Obesity We have encouraged continued exercise, careful diet management in an effort to lose weight.  Essential hypertension Having some leg swelling Possibly from amlodipine, will hold amlodipine and start losartan 25 daily Reports in the past she has been on metoprolol, carvedilol, these were held for unclear reasons We will review the notes   Total encounter time more than 60 minutes  Greater than 50% was spent in counseling and coordination of care with the patient  Patient seen in  consultation for Dr. Hall Busing will be referred back to his office for ongoing care of the issues detailed above   Signed, Esmond Plants, M.D., Ph.D. [place], [place]

## 2021-01-13 NOTE — Patient Instructions (Addendum)
Medication Instructions:  Please STOP  Amlodipine  Please START  losartan 25 mg daily  If you need a refill on your cardiac medications before your next appointment, please call your pharmacy.   Lab work: No new labs needed  Testing/Procedures: No new testing needed  Follow-Up: At [facility], you and your health needs are our priority.  As part of our continuing mission to provide you with exceptional heart care, we have created designated Provider Care Teams.  These Care Teams include your primary Cardiologist (physician) and Advanced Practice Providers (APPs -  Physician Assistants and Nurse Practitioners) who all work together to provide you with the care you need, when you need it.  You will need a follow up appointment in 6 months  Providers on your designated Care Team:   Murray Hodgkins, NP Christell Faith, PA-C Cadence Kathlen Mody, [place]  COVID-19 Vaccine Information can be found at: [email or web address].uk For questions related to vaccine distribution or appointments, please email vaccine@[email or web address] or call [phone].

## 2021-01-14 ENCOUNTER — Other Ambulatory Visit: Payer: Self-pay | Admitting: Adult Health

## 2021-02-14 ENCOUNTER — Telehealth: Payer: Self-pay | Admitting: Cardiovascular Disease

## 2021-02-14 NOTE — Telephone Encounter (Signed)
I found fax # [phone]. I will fax clearance notes to requesting office.

## 2021-02-14 NOTE — Telephone Encounter (Signed)
° °  Name: Brenda Craig  DOB: [birth year]  MRN: [id number]   Primary Cardiologist: Dr. Rockey Situ  Chart reviewed as part of pre-operative protocol coverage. Patient was contacted 02/14/2021 in reference to pre-operative risk assessment for pending surgery as outlined below.  Brenda Craig was last seen on 01/13/21 by Dr. Rockey Situ with history reviewed. Primarily followed for CAD with prior MI/PCI, last intervention 2014 per his note, negative stress test 11/2020 per [facility]. He acknowledged patient was scheduled for colonoscopy/EGD at that visit and recommended no further testing. Amlodipine was changed to losartan due to leg swelling and 6 month follow-up was recommended. I reached out to patient for update on how she is doing. The patient affirms she has been doing well without any new cardiac symptoms. Therefore, based on ACC/AHA guidelines, the patient would be at acceptable risk for the planned procedure without further cardiovascular testing. The patient was advised that if she develops new symptoms prior to surgery to contact our office to arrange for a follow-up visit, and she verbalized understanding.  I will route this recommendation to callback CMA to help fax this other to [facility] (fax number not listed below since patient initiated clearance request). She initiated this clearance because in the past she had to have clearance for other colonoscopies/endoscopies and wanted to ensure our office provided clearance. She indicates she was not instructed to hold her aspirin before the procedure, therefore clearance to hold not needed at this time.  Charlie Pitter, PA-C 02/14/2021, 11:50 AM

## 2021-02-14 NOTE — Telephone Encounter (Signed)
° °  Pre-operative Risk Assessment    Patient Name: Brenda Craig  DOB: [birth year] MRN: [id number]      Request for Surgical Clearance    Procedure:   PER PATIENT egd and colonoscopy   Date of Surgery:  Clearance 02/17/21                                 Surgeon:  Haig Prophet  Surgeon's Group or Practice Name:  [facility]  Phone number:  [phone] Fax number:  UNKNOWN PATIENT CALLING    Type of Clearance Requested:   - Medical  - Pharmacy:  Hold    please advise    Type of Anesthesia:   UNKNOWN PATIENT CALLING    Additional requests/questions:    Jonathon Jordan   02/14/2021, 10:52 AM

## 2021-02-15 NOTE — Telephone Encounter (Signed)
Thanks, let me know if you need anything else.

## 2021-02-16 ENCOUNTER — Encounter: Payer: Self-pay | Admitting: *Deleted

## 2021-02-17 ENCOUNTER — Ambulatory Visit
Admission: RE | Admit: 2021-02-17 | Discharge: 2021-02-17 | Disposition: A | Discharge status: Home / Self Care | Payer: BC Managed Care – PPO | Attending: Gastroenterology | Admitting: Gastroenterology

## 2021-02-17 ENCOUNTER — Ambulatory Visit: Payer: BC Managed Care – PPO | Admitting: Anesthesiology

## 2021-02-17 ENCOUNTER — Encounter
Admission: RE | Disposition: A | Discharge status: Home / Self Care | Payer: Self-pay | Source: Home / Self Care | Attending: Gastroenterology

## 2021-02-17 ENCOUNTER — Other Ambulatory Visit: Payer: Self-pay

## 2021-02-17 ENCOUNTER — Encounter: Payer: Self-pay | Admitting: *Deleted

## 2021-02-17 DIAGNOSIS — K317 Polyp of stomach and duodenum: Secondary | ICD-10-CM | POA: Insufficient documentation

## 2021-02-17 DIAGNOSIS — Z86010 Personal history of colonic polyps: Secondary | ICD-10-CM | POA: Insufficient documentation

## 2021-02-17 DIAGNOSIS — D122 Benign neoplasm of ascending colon: Secondary | ICD-10-CM | POA: Insufficient documentation

## 2021-02-17 DIAGNOSIS — Z1211 Encounter for screening for malignant neoplasm of colon: Secondary | ICD-10-CM | POA: Insufficient documentation

## 2021-02-17 DIAGNOSIS — Z955 Presence of coronary angioplasty implant and graft: Secondary | ICD-10-CM | POA: Insufficient documentation

## 2021-02-17 DIAGNOSIS — K449 Diaphragmatic hernia without obstruction or gangrene: Secondary | ICD-10-CM | POA: Diagnosis not present

## 2021-02-17 DIAGNOSIS — Z7982 Long term (current) use of aspirin: Secondary | ICD-10-CM | POA: Insufficient documentation

## 2021-02-17 DIAGNOSIS — F419 Anxiety disorder, unspecified: Secondary | ICD-10-CM | POA: Insufficient documentation

## 2021-02-17 DIAGNOSIS — I2510 Atherosclerotic heart disease of native coronary artery without angina pectoris: Secondary | ICD-10-CM | POA: Insufficient documentation

## 2021-02-17 DIAGNOSIS — Z90721 Acquired absence of ovaries, unilateral: Secondary | ICD-10-CM | POA: Insufficient documentation

## 2021-02-17 DIAGNOSIS — I509 Heart failure, unspecified: Secondary | ICD-10-CM | POA: Insufficient documentation

## 2021-02-17 DIAGNOSIS — E785 Hyperlipidemia, unspecified: Secondary | ICD-10-CM | POA: Diagnosis not present

## 2021-02-17 DIAGNOSIS — K219 Gastro-esophageal reflux disease without esophagitis: Secondary | ICD-10-CM | POA: Diagnosis not present

## 2021-02-17 DIAGNOSIS — G4730 Sleep apnea, unspecified: Secondary | ICD-10-CM | POA: Insufficient documentation

## 2021-02-17 DIAGNOSIS — K640 First degree hemorrhoids: Secondary | ICD-10-CM | POA: Diagnosis not present

## 2021-02-17 DIAGNOSIS — I110 Hypertensive heart disease with heart failure: Secondary | ICD-10-CM | POA: Diagnosis not present

## 2021-02-17 DIAGNOSIS — I252 Old myocardial infarction: Secondary | ICD-10-CM | POA: Diagnosis not present

## 2021-02-17 DIAGNOSIS — J45909 Unspecified asthma, uncomplicated: Secondary | ICD-10-CM | POA: Diagnosis not present

## 2021-02-17 DIAGNOSIS — K900 Celiac disease: Secondary | ICD-10-CM | POA: Insufficient documentation

## 2021-02-17 DIAGNOSIS — R1013 Epigastric pain: Secondary | ICD-10-CM | POA: Diagnosis present

## 2021-02-17 HISTORY — DX: Personal history of other diseases of the digestive system: Z87.19

## 2021-02-17 HISTORY — PX: ESOPHAGOGASTRODUODENOSCOPY: SHX5428

## 2021-02-17 HISTORY — DX: Headache, unspecified: R51.9

## 2021-02-17 HISTORY — DX: Irritable bowel syndrome, unspecified: K58.9

## 2021-02-17 HISTORY — PX: COLONOSCOPY: SHX5424

## 2021-02-17 SURGERY — COLONOSCOPY
Anesthesia: General

## 2021-02-17 MED ORDER — PHENYLEPHRINE HCL (PRESSORS) 10 MG/ML IV SOLN
INTRAVENOUS | Status: DC | PRN
  Administered 2021-02-17 (×2): 80 ug via INTRAVENOUS

## 2021-02-17 MED ORDER — DEXMEDETOMIDINE (PRECEDEX) IN NS 20 MCG/5ML (4 MCG/ML) IV SYRINGE
PREFILLED_SYRINGE | INTRAVENOUS | Status: DC | PRN
  Administered 2021-02-17 (×2): 4 ug via INTRAVENOUS

## 2021-02-17 MED ORDER — PROPOFOL 500 MG/50ML IV EMUL
INTRAVENOUS | Status: DC | PRN
  Administered 2021-02-17: 150 ug/kg/min via INTRAVENOUS

## 2021-02-17 MED ORDER — IPRATROPIUM-ALBUTEROL 0.5-2.5 (3) MG/3ML IN SOLN
RESPIRATORY_TRACT | Status: AC
  Administered 2021-02-17: 3 mL
  Filled 2021-02-17: qty 3

## 2021-02-17 MED ORDER — LIDOCAINE HCL (CARDIAC) PF 100 MG/5ML IV SOSY
PREFILLED_SYRINGE | INTRAVENOUS | Status: DC | PRN
  Administered 2021-02-17: 50 mg via INTRAVENOUS

## 2021-02-17 MED ORDER — SODIUM CHLORIDE 0.9 % IV SOLN: INTRAVENOUS | Status: DC

## 2021-02-17 MED ORDER — PROPOFOL 500 MG/50ML IV EMUL
INTRAVENOUS | Status: AC
  Filled 2021-02-17: qty 50

## 2021-02-17 MED ORDER — PROPOFOL 10 MG/ML IV BOLUS
INTRAVENOUS | Status: DC | PRN
  Administered 2021-02-17: 70 mg via INTRAVENOUS

## 2021-02-17 MED ORDER — PROPOFOL 10 MG/ML IV BOLUS
INTRAVENOUS | Status: AC
  Filled 2021-02-17: qty 20

## 2021-02-17 NOTE — Op Note (Signed)
[facility] Patient Name: Brenda Craig Procedure Date: 02/17/2021 10:28 AM MRN: [id number] Account #: [id number] Date of Birth: 1960/02/05 Admit Type: Outpatient Age: 61 Room: [facility] ENDO ROOM 3 Gender: Female Note Status: Finalized Instrument Name: Altamese Cabal Endoscope [id number] Procedure:             Upper GI endoscopy Indications:           Dysphagia, Celiac disease Providers:             Andrey Farmer MD, MD Referring MD:          Leona Carry. Hall Busing, MD (Referring MD) Medicines:             Monitored Anesthesia Care Complications:         No immediate complications. Procedure:             Pre-Anesthesia Assessment:                        - Prior to the procedure, a History and Physical was                         performed, and patient medications and allergies were                         reviewed. The patient is competent. The risks and                         benefits of the procedure and the sedation options and                         risks were discussed with the patient. All questions                         were answered and informed consent was obtained.                         Patient identification and proposed procedure were                         verified by the physician, the nurse, the                         anesthesiologist, the anesthetist and the technician                         in the endoscopy suite. Mental Status Examination:                         alert and oriented. Airway Examination: normal                         oropharyngeal airway and neck mobility. Respiratory                         Examination: clear to auscultation. CV Examination:                         normal. Prophylactic Antibiotics: The patient does not  require prophylactic antibiotics. Prior                         Anticoagulants: The patient has taken no previous                         anticoagulant or antiplatelet agents. ASA  Grade                         Assessment: II - A patient with mild systemic disease.                         After reviewing the risks and benefits, the patient                         was deemed in satisfactory condition to undergo the                         procedure. The anesthesia plan was to use monitored                         anesthesia care (MAC). Immediately prior to                         administration of medications, the patient was                         re-assessed for adequacy to receive sedatives. The                         heart rate, respiratory rate, oxygen saturations,                         blood pressure, adequacy of pulmonary ventilation, and                         response to care were monitored throughout the                         procedure. The physical status of the patient was                         re-assessed after the procedure.                        After obtaining informed consent, the endoscope was                         passed under direct vision. Throughout the procedure,                         the patient's blood pressure, pulse, and oxygen                         saturations were monitored continuously. The Endoscope                         was introduced through the mouth, and advanced to the  second part of duodenum. The upper GI endoscopy was                         somewhat difficult due to significant coughing. The                         patient tolerated the procedure well. Findings:      A 6 cm hiatal hernia was present.      A few small sessile fundic gland polyps with no bleeding and no stigmata       of recent bleeding were found in the gastric body.      The exam of the stomach was otherwise normal.      The examined duodenum was normal. Villi intact, celiac panel was       negative for any active disease. Impression:            - 6 cm hiatal hernia.                        - A few fundic gland polyps.                         - Normal examined duodenum.                        - No specimens collected. Recommendation:        - Perform a colonoscopy today.                        - Perform a barium swallow using barium in liquid and                         tablet form if symptoms persist. Procedure Code(s):     --- Professional ---                        [id number], Esophagogastroduodenoscopy, flexible,                         transoral; diagnostic, including collection of                         specimen(s) by brushing or washing, when performed                         (separate procedure) Diagnosis Code(s):     --- Professional ---                        K44.9, Diaphragmatic hernia without obstruction or                         gangrene                        K31.7, Polyp of stomach and duodenum                        R13.10, Dysphagia, unspecified                        K90.0, Celiac disease CPT copyright 2019 American Medical  Association. All rights reserved. The codes documented in this report are preliminary and upon coder review may  be revised to meet current compliance requirements. Andrey Farmer MD, MD 02/17/2021 11:09:04 AM Number of Addenda: 0 Note Initiated On: 02/17/2021 10:28 AM Estimated Blood Loss:  Estimated blood loss: none.      [facility]

## 2021-02-17 NOTE — Op Note (Signed)
[facility] Patient Name: Brenda Craig Procedure Date: 02/17/2021 10:26 AM MRN: [id number] Account #: [id number] Date of Birth: 12-24-1960 Admit Type: Outpatient Age: 61 Room: [facility] ENDO ROOM 3 Gender: Female Note Status: Finalized Instrument Name: Jasper Riling [id number] Procedure:             Colonoscopy Indications:           High risk colon cancer surveillance: Personal history                         of sessile serrated colon polyp (10 mm or greater in                         size), Last colonoscopy 5 years ago Providers:             Andrey Farmer MD, MD Referring MD:          Leona Carry. Hall Busing, MD (Referring MD) Medicines:             Monitored Anesthesia Care Complications:         No immediate complications. Estimated blood loss:                         Minimal. Procedure:             Pre-Anesthesia Assessment:                        - Prior to the procedure, a History and Physical was                         performed, and patient medications and allergies were                         reviewed. The patient is competent. The risks and                         benefits of the procedure and the sedation options and                         risks were discussed with the patient. All questions                         were answered and informed consent was obtained.                         Patient identification and proposed procedure were                         verified by the physician, the nurse, the                         anesthesiologist, the anesthetist and the technician                         in the endoscopy suite. Mental Status Examination:                         alert and oriented. Airway Examination: normal  oropharyngeal airway and neck mobility. Respiratory                         Examination: clear to auscultation. CV Examination:                         normal. Prophylactic Antibiotics: The patient does  not                         require prophylactic antibiotics. Prior                         Anticoagulants: The patient has taken no previous                         anticoagulant or antiplatelet agents. ASA Grade                         Assessment: II - A patient with mild systemic disease.                         After reviewing the risks and benefits, the patient                         was deemed in satisfactory condition to undergo the                         procedure. The anesthesia plan was to use monitored                         anesthesia care (MAC). Immediately prior to                         administration of medications, the patient was                         re-assessed for adequacy to receive sedatives. The                         heart rate, respiratory rate, oxygen saturations,                         blood pressure, adequacy of pulmonary ventilation, and                         response to care were monitored throughout the                         procedure. The physical status of the patient was                         re-assessed after the procedure.                        After obtaining informed consent, the colonoscope was                         passed under direct vision. Throughout the procedure,  the patient's blood pressure, pulse, and oxygen                         saturations were monitored continuously. The                         Colonoscope was introduced through the anus and                         advanced to the the cecum, identified by appendiceal                         orifice and ileocecal valve. The colonoscopy was                         performed without difficulty. The patient tolerated                         the procedure well. The quality of the bowel                         preparation was good. Findings:      The perianal and digital rectal examinations were normal.      A 5 mm polyp was found in the ascending  colon. The polyp was sessile.       The polyp was removed with a cold snare. Resection and retrieval were       complete. Estimated blood loss was minimal.      Internal hemorrhoids were found during retroflexion. The hemorrhoids       were Grade I (internal hemorrhoids that do not prolapse).      The exam was otherwise without abnormality on direct and retroflexion       views. Impression:            - One 5 mm polyp in the ascending colon, removed with                         a cold snare. Resected and retrieved.                        - Internal hemorrhoids.                        - The examination was otherwise normal on direct and                         retroflexion views. Recommendation:        - Discharge patient to home.                        - Resume previous diet.                        - Continue present medications.                        - Await pathology results.                        - Repeat colonoscopy in 7 years for surveillance.                        -  Return to referring physician as previously                         scheduled. Procedure Code(s):     --- Professional ---                        [phone], Colonoscopy, flexible; with removal of                         tumor(s), polyp(s), or other lesion(s) by snare                         technique Diagnosis Code(s):     --- Professional ---                        Z86.010, Personal history of colonic polyps                        K63.5, Polyp of colon                        K64.0, First degree hemorrhoids CPT copyright 2019 [employer]. All rights reserved. The codes documented in this report are preliminary and upon coder review may  be revised to meet current compliance requirements. Andrey Farmer MD, MD 02/17/2021 11:13:12 AM Number of Addenda: 0 Note Initiated On: 02/17/2021 10:26 AM Scope Withdrawal Time: 0 hours 8 minutes 32 seconds  Total Procedure Duration: 0 hours 16 minutes 21 seconds   Estimated Blood Loss:  Estimated blood loss was minimal.      [facility]

## 2021-02-17 NOTE — Anesthesia Preprocedure Evaluation (Addendum)
Anesthesia Evaluation  Patient identified by MRN, date of birth, ID band Patient awake    Reviewed: Allergy & Precautions, NPO status , Patient's Chart, lab work & pertinent test results  History of Anesthesia Complications (+) PONV and history of anesthetic complications  Airway Mallampati: II   Neck ROM: Full    Dental  (+)    Pulmonary asthma , sleep apnea, Continuous Positive Airway Pressure Ventilation and Oxygen sleep apnea ,    Pulmonary exam normal breath sounds clear to auscultation       Cardiovascular hypertension, + CAD (s/p MI and stents x2) and +CHF  Normal cardiovascular exam+ Valvular Problems/Murmurs MVP  Rhythm:Regular Rate:Normal  ECG 01/13/21:  NSR. Low voltage QRS. Old inferior MI with small Q waves. Abnormal EKG. HR 81 BPM. QTc 441 ms.   Stress test 11/28/20:  1. Normal Lexiscan Myoview stress test without significant inducible myocardial ischemia  2. Normal left ventricular systolic function.   Neuro/Psych  Headaches, PSYCHIATRIC DISORDERS Anxiety    GI/Hepatic hiatal hernia, PUD, GERD  ,Celiac disease   Endo/Other  Class 3 obesity  Renal/GU Renal disease (nephrolithiasis)     Musculoskeletal  (+) Arthritis ,   Abdominal   Peds  Hematology  (+) Blood dyscrasia, anemia ,   Anesthesia Other Findings Cardiology note 01/13/21:  Coronary disease with stable angina Stable symptoms, recent concern for angina November 2022 with stress test showing no high risk ischemia No further testing at this time Non-smoker, no diabetes, we have requested lipid panel from Dr. Hall Busing Typical anginal symptoms include back pain  Hyperlipidemia Continue Crestor 10, goal LDL less than 55  Obesity We have encouraged continued exercise, careful diet management in an effort to lose weight.  Essential hypertension Having some leg swelling Possibly from amlodipine, will hold amlodipine and start losartan 25  daily Reports in the past she has been on metoprolol, carvedilol, these were held for unclear reasons We will review the notes  Reproductive/Obstetrics                            Anesthesia Physical Anesthesia Plan  ASA: 3  Anesthesia Plan: General   Post-op Pain Management:    Induction: Intravenous  PONV Risk Score and Plan: 4 or greater and Propofol infusion, TIVA and Treatment may vary due to age or medical condition  Airway Management Planned: Natural Airway  Additional Equipment:   Intra-op Plan:   Post-operative Plan:   Informed Consent: I have reviewed the patients History and Physical, chart, labs and discussed the procedure including the risks, benefits and alternatives for the proposed anesthesia with the patient or authorized representative who has indicated his/her understanding and acceptance.       Plan Discussed with: CRNA  Anesthesia Plan Comments: (LMA/GETA backup discussed.  Patient consented for risks of anesthesia including but not limited to:  - adverse reactions to medications - damage to eyes, teeth, lips or other oral mucosa - nerve damage due to positioning  - sore throat or hoarseness - damage to heart, brain, nerves, lungs, other parts of body or loss of life  Informed patient about role of CRNA in peri- and intra-operative care.  Patient voiced understanding.)        Anesthesia Quick Evaluation

## 2021-02-17 NOTE — Anesthesia Procedure Notes (Signed)
Procedure Name: MAC Date/Time: 02/17/2021 10:30 AM Performed by: Jerrye Noble, CRNA Pre-anesthesia Checklist: Patient identified, Emergency Drugs available, Suction available and Patient being monitored Patient Re-evaluated:Patient Re-evaluated prior to induction Oxygen Delivery Method: Simple face mask

## 2021-02-17 NOTE — Transfer of Care (Signed)
Immediate Anesthesia Transfer of Care Note  Patient: Brenda Craig  Procedure(s) Performed: COLONOSCOPY ESOPHAGOGASTRODUODENOSCOPY (EGD)  Patient Location: PACU and Endoscopy Unit  Anesthesia Type:General  Level of Consciousness: awake, drowsy and patient cooperative  Airway & Oxygen Therapy: Patient Spontanous Breathing and Patient connected to face mask oxygen.  Breathing treatment to be given per Dr. Erenest Rasher.   Post-op Assessment: Report given to RN and Post -op Vital signs reviewed and stable  Post vital signs: Reviewed and stable  Last Vitals:  Vitals Value Taken Time  BP 122/49 02/17/21 1103  Temp    Pulse 103 02/17/21 1105  Resp 21 02/17/21 1105  SpO2 94 % 02/17/21 1105  Vitals shown include unvalidated device data.  Last Pain:  Vitals:   02/17/21 0952  TempSrc: Temporal  PainSc: 0-No pain         Complications: No notable events documented.

## 2021-02-17 NOTE — Anesthesia Postprocedure Evaluation (Signed)
Anesthesia Post Note  Patient: Brenda Craig  Procedure(s) Performed: COLONOSCOPY ESOPHAGOGASTRODUODENOSCOPY (EGD)  Patient location during evaluation: PACU Anesthesia Type: General Level of consciousness: awake and alert, oriented and patient cooperative Pain management: pain level controlled Vital Signs Assessment: post-procedure vital signs reviewed and stable Respiratory status: spontaneous breathing, nonlabored ventilation and respiratory function stable (post op cough requiring duo neb) Cardiovascular status: blood pressure returned to baseline and stable Postop Assessment: adequate PO intake Anesthetic complications: no   No notable events documented.   Last Vitals:  Vitals:   02/17/21 1103 02/17/21 1113  BP: (!) 122/49 99/78  Pulse:    Resp:    Temp: 36.7 C   SpO2:  97%    Last Pain:  Vitals:   02/17/21 1123  TempSrc:   PainSc: 0-No pain                 Darrin Nipper

## 2021-02-17 NOTE — H&P (Signed)
Outpatient short stay form Pre-procedure 02/17/2021  [name], MD  Primary Physician: Albina Billet, MD  Reason for visit:  Dyspepsia/Personal History of polyps  History of present illness:    61 y/o lady with history of CAD on aspirin, hypertension, and GERD here for EGD/Colonoscopy for dyspeptic symptoms and dysphagia to liquids but not as much solids. No family history of GI malignancies. History of oopherectomy. No new symptoms.    Current Facility-Administered Medications:    0.9 %  sodium chloride infusion, , Intravenous, Continuous, Addilynn Mowrer, Hilton Cork, MD, Last Rate: 20 mL/hr at 02/17/21 1001, Continued from Pre-op at 02/17/21 1001  Medications Prior to Admission  Medication Sig Dispense Refill Last Dose   amLODipine (NORVASC) 5 MG tablet Take 5 mg by mouth daily.   02/17/2021   [facility] ASPIRIN ADULT LOW DOSE 81 MG chewable tablet Chew 81 mg by mouth daily.   02/16/2021   losartan (COZAAR) 25 MG tablet Take 1 tablet (25 mg total) by mouth daily. 90 tablet 3 02/16/2021   montelukast (SINGULAIR) 10 MG tablet Take 10 mg by mouth at bedtime.      predniSONE (DELTASONE) 10 MG tablet Take 10 mg by mouth daily with breakfast.      albuterol (VENTOLIN HFA) 108 (90 Base) MCG/ACT inhaler Inhale 1-2 puffs into the lungs every 4 (four) hours as needed for wheezing or shortness of breath.      benzonatate (TESSALON) 200 MG capsule TAKE 1 CAPSULE (200 MG TOTAL) BY MOUTH 3 (THREE) TIMES DAILY AS NEEDED FOR COUGH. 30 capsule 11    Budeson-Glycopyrrol-Formoterol (BREZTRI AEROSPHERE) 160-9-4.8 MCG/ACT AERO Inhale 2 puffs into the lungs daily.      chlorpheniramine-HYDROcodone (TUSSIONEX) 10-8 MG/5ML SUER Take 5 mLs by mouth every 12 (twelve) hours as needed for cough.      clotrimazole-betamethasone (LOTRISONE) cream APPLY TO AFFECTED AREA TWICE A DAY AS NEEDED FOR SYMPTOMS FOR UP TO 2 WEEKS 45 g 1    diphenhydrAMINE (BENADRYL) 50 MG tablet Take 0.5-1 tablets (25-50 mg total) by mouth every 6  (six) hours as needed for itching (Take with oxycodone). 60 tablet 0    ergocalciferol (VITAMIN D2) 1.25 MG (50000 UT) capsule Take 1 capsule by mouth once a week.      famotidine (PEPCID) 40 MG tablet Take 40 mg by mouth at bedtime.      ferrous sulfate 325 (65 FE) MG tablet Take 325 mg by mouth every morning.      fluticasone (FLONASE) 50 MCG/ACT nasal spray Place 2 sprays into both nostrils daily.      LORazepam (ATIVAN) 1 MG tablet Take 1 mg by mouth 2 (two) times daily.      NITROSTAT 0.4 MG SL tablet Place 0.4 mg under the tongue every 5 (five) minutes as needed for chest pain.       pantoprazole (PROTONIX) 40 MG tablet Take 40 mg by mouth every morning.      rosuvastatin (CRESTOR) 10 MG tablet Take 10 mg by mouth at bedtime.        Allergies  Allergen Reactions   Contrast Media [Iodinated Contrast Media] Itching   Other     Gluten allergy   Codeine Nausea And Vomiting and Rash   Nexium [Esomeprazole Magnesium] Palpitations   Omeprazole Magnesium Palpitations   Oxycodone-Acetaminophen Itching, Nausea And Vomiting and Rash   Percocet [Oxycodone-Acetaminophen] Itching, Nausea And Vomiting and Rash    Can take with benadryl    Tramadol Rash    Can  take with benadryl      Past Medical History:  Diagnosis Date   Abnormal Pap smear of cervix 1988   Acid reflux 01/17/2013   Allergic rhinitis    Anemia    Anginal pain (HCC)    Anxiety    Arthritis    Asthma    Celiac disease    Cervical dysplasia 1988   CHF (congestive heart failure) (HCC)    Complication of anesthesia    bp drops after anesthesia and has to be kept overnight   Coronary artery disease    Headache    migraine with visual aura   Heart trouble    History of cardiac arrest    History of hiatal hernia    History of kidney stones    History of mammogram 03/25/2013; 12/07/14   BIRADS 1; NEG   History of Papanicolaou smear of cervix 03/20/12; 12/28/15   -/-; -/-   Hypercholesteremia    Hypertension    IBS  (irritable bowel syndrome)    Menopausal symptoms    MI (myocardial infarction) ([place])    Mitral valve prolapse    Pneumonia    PONV (postoperative nausea and vomiting)    nausea only   Sleep apnea    Vulvovaginitis    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    Physical Exam  Gen: Alert, oriented. Appears stated age.  HEENT: PERRLA. Lungs: No respiratory distress CV: RRR Abd: soft, benign, no masses Ext: No edema    Planned procedures: Proceed with EGD/colonoscopy. The patient understands the nature of the planned procedure, indications, risks, alternatives and potential complications including but not limited to bleeding, infection, perforation, damage to internal organs and possible oversedation/side effects from anesthesia. The patient agrees and gives consent to proceed.  Please refer to procedure notes for findings, recommendations and patient disposition/instructions.     [name], MD [facility]

## 2021-02-17 NOTE — Interval H&P Note (Signed)
History and Physical Interval Note:  02/17/2021 10:24 AM  Brenda Craig  has presented today for surgery, with the diagnosis of Celiac disease (K90.0) Hx of adenomatous colonic polyps (Z86.010) Gastroesophageal reflux disease, unspecified whether esophagitis present (K21.9) Esophageal dysphagia (R13.19).  The various methods of treatment have been discussed with the patient and family. After consideration of risks, benefits and other options for treatment, the patient has consented to  Procedure(s): COLONOSCOPY (N/A) ESOPHAGOGASTRODUODENOSCOPY (EGD) (N/A) as a surgical intervention.  The patient's history has been reviewed, patient examined, no change in status, stable for surgery.  I have reviewed the patient's chart and labs.  Questions were answered to the patient's satisfaction.     [name]  Ok to proceed with EGD/Colonoscopy

## 2021-02-20 ENCOUNTER — Encounter: Payer: Self-pay | Admitting: Gastroenterology

## 2021-02-20 LAB — SURGICAL PATHOLOGY

## 2021-02-24 ENCOUNTER — Other Ambulatory Visit: Payer: Self-pay

## 2021-02-24 ENCOUNTER — Encounter: Payer: Self-pay | Admitting: Primary Care

## 2021-02-24 ENCOUNTER — Ambulatory Visit: Payer: BC Managed Care – PPO | Admitting: Primary Care

## 2021-02-24 VITALS — BP 122/70 | HR 80 | Temp 98.5°F | Ht 65.0 in | Wt 226.0 lb

## 2021-02-24 DIAGNOSIS — G4733 Obstructive sleep apnea (adult) (pediatric): Secondary | ICD-10-CM

## 2021-02-24 DIAGNOSIS — J4550 Severe persistent asthma, uncomplicated: Secondary | ICD-10-CM

## 2021-02-24 DIAGNOSIS — J9611 Chronic respiratory failure with hypoxia: Secondary | ICD-10-CM

## 2021-02-24 MED ORDER — ALBUTEROL SULFATE (2.5 MG/3ML) 0.083% IN NEBU: 2.5000 mg | INHALATION_SOLUTION | Freq: Four times a day (QID) | RESPIRATORY_TRACT | 2 refills | Status: DC | PRN

## 2021-02-24 MED ORDER — BUDESONIDE 0.5 MG/2ML IN SUSP: 0.5000 mg | Freq: Two times a day (BID) | RESPIRATORY_TRACT | 2 refills | Status: DC

## 2021-02-24 MED ORDER — BENZONATATE 200 MG PO CAPS: 200.0000 mg | ORAL_CAPSULE | Freq: Three times a day (TID) | ORAL | 11 refills | Status: DC | PRN

## 2021-02-24 NOTE — Progress Notes (Signed)
@Patient  ID: Brenda Craig, female    DOB: 07/25/1960, 61 y.o.   MRN: [id number]  Chief Complaint  Patient presents with   Follow-up    Wearing cpap 6-12hr nightly-feels pressure and mask is okay. 2L bled in.  C/o dry cough at times prod with clear sputum, wheezing and sob with exertion.     Referring provider: Albina Billet, MD  HPI: 61 year old female, never smoked. PMH significant for OSA, chronic cough, hyperreactive airways, chronic respiratory failure with hypoxia, pneumonia, HTN, CAD, acid reflux, celiac disease, hyperlipidemia.   02/24/2021 Patient presents today for 6 month follow-up/ OSA and chronic cough. Patient has been having cough fits, worse lately during the day time. Patient had pneumonia in January 2022. CXR in June 2022 showed no acute process, unchanged bandlike scarring right midlung and left lung base. She is using breztri aerosphere but feels it is not working. She has been on nebulizer in the past with better results. She is currently using 2L oxygen at night blended into her CPAP. She has not had ONO.    Airview download 01/24/21-02/22/21 Usage days 29/30 days; 97% > 4 hours Average usage 7 hours 16 mins Pressure 5-10cm h20 (9.8cm h20-95%) Airleaks 22.9L/min (95%) AHI 0.6   Allergies  Allergen Reactions   Contrast Media [Iodinated Contrast Media] Itching   Other     Gluten allergy   Codeine Nausea And Vomiting and Rash   Nexium [Esomeprazole Magnesium] Palpitations   Omeprazole Magnesium Palpitations   Oxycodone-Acetaminophen Itching, Nausea And Vomiting and Rash   Percocet [Oxycodone-Acetaminophen] Itching, Nausea And Vomiting and Rash    Can take with benadryl    Tramadol Rash    Can take with benadryl     Immunization History  Administered Date(s) Administered   Influenza,inj,Quad PF,6+ Mos 05/25/2013, 11/13/2017, 10/17/2018   Influenza-Unspecified 10/10/2020   PFIZER Comirnaty(Gray Top)Covid-19 Tri-Sucrose Vaccine 05/06/2020    PFIZER(Purple Top)SARS-COV-2 Vaccination 04/16/2019, 05/07/2019   Pneumococcal Polysaccharide-23 12/16/2018    Past Medical History:  Diagnosis Date   Abnormal Pap smear of cervix 1988   Acid reflux 01/17/2013   Allergic rhinitis    Anemia    Anginal pain (HCC)    Anxiety    Arthritis    Asthma    Celiac disease    Cervical dysplasia 1988   CHF (congestive heart failure) (HCC)    Complication of anesthesia    bp drops after anesthesia and has to be kept overnight   Coronary artery disease    Headache    migraine with visual aura   Heart trouble    History of cardiac arrest    History of hiatal hernia    History of kidney stones    History of mammogram 03/25/2013; 12/07/14   BIRADS 1; NEG   History of Papanicolaou smear of cervix 03/20/12; 12/28/15   -/-; -/-   Hypercholesteremia    Hypertension    IBS (irritable bowel syndrome)    Menopausal symptoms    MI (myocardial infarction) (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     amLODipine (NORVASC) 5 MG tablet Take 5 mg by mouth daily.     chlorpheniramine-HYDROcodone (TUSSIONEX) 10-8 MG/5ML SUER Take 5 mLs by mouth every 12 (twelve) hours as needed for cough.     clotrimazole-betamethasone (LOTRISONE) cream APPLY TO AFFECTED AREA TWICE A DAY AS NEEDED FOR SYMPTOMS FOR UP TO 2 WEEKS 45 g 1   [facility] ASPIRIN ADULT LOW DOSE 81 MG chewable tablet Chew 81 mg by mouth daily.     diphenhydrAMINE (BENADRYL) 50 MG tablet  Take 0.5-1 tablets (25-50 mg total) by mouth every 6 (six) hours as needed for itching (Take with oxycodone). 60 tablet 0   ergocalciferol (VITAMIN D2) 1.25 MG (50000 UT) capsule Take 1 capsule by mouth once a week.     famotidine (PEPCID) 40 MG tablet Take 40 mg by mouth at bedtime.     ferrous sulfate 325 (65 FE) MG tablet Take 325 mg by mouth every morning.     LORazepam (ATIVAN) 1 MG tablet Take 1 mg by mouth 2 (two) times daily.     losartan (COZAAR) 25 MG tablet Take 1 tablet (25 mg total) by mouth daily. 90 tablet 3   montelukast (SINGULAIR) 10 MG tablet Take 10 mg by mouth at bedtime.     NITROSTAT 0.4 MG SL tablet Place 0.4 mg under the tongue every 5 (five) minutes as needed for chest pain.      pantoprazole (PROTONIX) 40 MG tablet Take 40 mg by mouth every morning.     rosuvastatin (CRESTOR) 10 MG tablet Take 10 mg by mouth at bedtime.     benzonatate (TESSALON) 200 MG capsule TAKE 1 CAPSULE (200 MG TOTAL) BY MOUTH 3 (THREE) TIMES DAILY AS NEEDED FOR COUGH. 30 capsule 11   Budeson-Glycopyrrol-Formoterol (BREZTRI AEROSPHERE) 160-9-4.8 MCG/ACT AERO Inhale 2 puffs into the lungs daily.     fluticasone (FLONASE) 50 MCG/ACT nasal spray Place 2 sprays into both nostrils daily.     predniSONE (DELTASONE) 10 MG tablet Take 10 mg by mouth daily with breakfast.     No facility-administered medications prior to visit.    Review of Systems  Review of Systems  Constitutional: Negative.   HENT: Negative.    Respiratory:  Positive for cough. Negative for shortness of breath.     Physical Exam  BP 122/70 (BP Location: Left Arm, Cuff Size: Normal)    Pulse 80    Temp 98.5 F (36.9 C) (Temporal)    Ht 5\' 5"  (1.651 m)    Wt 226 lb (102.5 kg)    SpO2 96%    BMI 37.61 kg/m  Physical Exam Constitutional:      General: She is not in acute distress.    Appearance: Normal appearance. She is not ill-appearing.  HENT:     Head: Normocephalic and atraumatic.  Cardiovascular:     Rate and Rhythm:  Normal rate and regular rhythm.  Pulmonary:     Effort: Pulmonary effort is normal.     Breath sounds: No wheezing, rhonchi or rales.     Comments: Reactive cough Musculoskeletal:        General: Normal range of motion.  Skin:    General: Skin is warm and dry.  Neurological:     General: No focal deficit present.     Mental Status: She is alert and oriented  to person, place, and time. Mental status is at baseline.  Psychiatric:        Mood and Affect: Mood normal.        Behavior: Behavior normal.        Thought Content: Thought content normal.        Judgment: Judgment normal.     Lab Results:  CBC    Component Value Date/Time   WBC 10.8 (H) 01/23/2020 1136   RBC 3.97 01/23/2020 1136   HGB 10.9 (L) 01/23/2020 1136   HGB 11.9 (L) 06/10/2013 0209   HCT 35.2 (L) 01/23/2020 1136   HCT 36.1 06/10/2013 0209   PLT 262 01/23/2020 1136   PLT 214 06/10/2013 0209   MCV 88.7 01/23/2020 1136   MCV 92 06/10/2013 0209   MCH 27.5 01/23/2020 1136   MCHC 31.0 01/23/2020 1136   RDW 15.3 01/23/2020 1136   RDW 15.2 (H) 06/10/2013 0209   LYMPHSABS 2.3 01/23/2020 1136   LYMPHSABS 2.0 06/10/2013 0209   MONOABS 1.3 (H) 01/23/2020 1136   MONOABS 0.7 06/10/2013 0209   EOSABS 0.1 01/23/2020 1136   EOSABS 0.1 06/10/2013 0209   BASOSABS 0.0 01/23/2020 1136   BASOSABS 0.0 06/10/2013 0209    BMET    Component Value Date/Time   NA 139 01/23/2020 1136   NA 143 06/10/2013 0209   K 3.4 (L) 01/23/2020 1136   K 3.7 06/10/2013 0209   CL 100 01/23/2020 1136   CL 109 (H) 06/10/2013 0209   CO2 28 01/23/2020 1136   CO2 29 06/10/2013 0209   GLUCOSE 103 (H) 01/23/2020 1136   GLUCOSE 90 06/10/2013 0209   BUN 18 01/23/2020 1136   BUN 14 06/10/2013 0209   CREATININE 0.97 01/23/2020 1136   CREATININE 0.87 06/10/2013 0209   CALCIUM 8.9 01/23/2020 1136   CALCIUM 8.9 06/10/2013 0209   GFRNONAA >60 01/23/2020 1136   GFRNONAA >60 06/10/2013 0209   GFRAA >60 12/18/2018 0343   GFRAA >60 06/10/2013  0209    BNP    Component Value Date/Time   BNP 40.0 01/22/2020 0832    ProBNP No results found for: PROBNP  Imaging: No results found.   Assessment & Plan:   OSA (obstructive sleep apnea) - Patient is 97% compliant with CPAP > 4 hours. Pressure 5-10cm 2h0 (9.8cm h20-95%); residual AHI 0.6. NO changes recommended. Continue to encourage patient wear CPAP every night. Patient would like to change DME companies.   Chronic respiratory failure with hypoxia (HCC) - Continue 2L supplemental oxygen with CPAP. Needs ONO on CPAP without oxygen   Severe persistent asthma - Patient has reactive cough. No improvement with Breztri, difficulty using device. Changing to nebulized budesonide 0.5/71ml twice daily and albuterol q 6 hours. Needs updated pulmonary function testing.    Martyn Ehrich, NP 03/01/2021

## 2021-02-24 NOTE — Patient Instructions (Addendum)
Recommendation: - Stop Breztri Aerosphere  - Start Budesonide (steriod) nebulizer morning and evening  - Take albuterol every 6 hours as needed for shortness for breath/wheezing  - Restart flonase nasal spray 1 puff per nostril daily   Orders: - Overnight oximetry on CPAP/ room air (ordered, please call DME to ensure this is scheduled as soon as possible) - Pulmonary function testing (ordered)  - Patient would like to change DME companied for CPAP supplies, she will also need nebulizer supplies   Follow-up: -Return after testing in 4-8 weeks

## 2021-02-27 ENCOUNTER — Telehealth: Payer: Self-pay | Admitting: Primary Care

## 2021-02-27 NOTE — Telephone Encounter (Signed)
Patient stated she found neb machine but when she called about the Rx for neb meds there wasn't a Rx called in for her.  [facility] [address] [facility] [employer]

## 2021-02-27 NOTE — Telephone Encounter (Signed)
[facility] stated that [facility], however they were having issues with their computer that day. Verbal given to [facility] for Pulmicort 0.45m.  Lm for patient.

## 2021-02-27 NOTE — Telephone Encounter (Signed)
[facility] [place], can we follow-up on this. Do I need to send to [place]

## 2021-02-28 NOTE — Telephone Encounter (Signed)
Lm x2 for patient.  Will close encounter per office protocol.   

## 2021-03-01 DIAGNOSIS — J4550 Severe persistent asthma, uncomplicated: Secondary | ICD-10-CM | POA: Insufficient documentation

## 2021-03-01 NOTE — Assessment & Plan Note (Addendum)
-   Patient is 97% compliant with CPAP > 4 hours. Pressure 5-10cm 2h0 (9.8cm h20-95%); residual AHI 0.6. NO changes recommended. Continue to encourage patient wear CPAP every night. Patient would like to change DME companies.

## 2021-03-01 NOTE — Assessment & Plan Note (Addendum)
-   Patient has reactive cough. No improvement with Breztri, difficulty using device. Changing to nebulized budesonide 0.5/21ml twice daily and albuterol q 6 hours. Needs updated pulmonary function testing.

## 2021-03-01 NOTE — Assessment & Plan Note (Addendum)
-   Continue 2L supplemental oxygen with CPAP. Needs ONO on CPAP without oxygen

## 2021-03-02 ENCOUNTER — Telehealth: Payer: Self-pay

## 2021-03-02 NOTE — Telephone Encounter (Signed)
Pt aware of upcoming Covid test. Nothing further needed.  °

## 2021-03-07 ENCOUNTER — Other Ambulatory Visit
Admission: RE | Admit: 2021-03-07 | Discharge: 2021-03-07 | Disposition: A | Discharge status: Home / Self Care | Payer: BC Managed Care – PPO | Source: Ambulatory Visit | Attending: Primary Care | Admitting: Primary Care

## 2021-03-07 ENCOUNTER — Other Ambulatory Visit: Payer: Self-pay

## 2021-03-07 DIAGNOSIS — Z01812 Encounter for preprocedural laboratory examination: Secondary | ICD-10-CM | POA: Diagnosis present

## 2021-03-07 DIAGNOSIS — Z20822 Contact with and (suspected) exposure to covid-19: Secondary | ICD-10-CM | POA: Diagnosis not present

## 2021-03-08 ENCOUNTER — Ambulatory Visit: Payer: BC Managed Care – PPO | Attending: Primary Care

## 2021-03-08 DIAGNOSIS — G4733 Obstructive sleep apnea (adult) (pediatric): Secondary | ICD-10-CM | POA: Insufficient documentation

## 2021-03-08 LAB — SARS CORONAVIRUS 2 (TAT 6-24 HRS): SARS Coronavirus 2: NEGATIVE

## 2021-03-08 MED ORDER — ALBUTEROL SULFATE (2.5 MG/3ML) 0.083% IN NEBU
2.5000 mg | INHALATION_SOLUTION | Freq: Once | RESPIRATORY_TRACT | Status: AC
  Administered 2021-03-08: 2.5 mg via RESPIRATORY_TRACT
  Filled 2021-03-08: qty 3

## 2021-03-10 NOTE — Progress Notes (Signed)
PFTs showed moderate obstructive airways disease related to asthma. Lung function is about 60% predicted (normal is 80%). How is she doing with nebulized treatments? Has she had ONO yet?

## 2021-03-17 ENCOUNTER — Telehealth: Payer: Self-pay | Admitting: Primary Care

## 2021-03-17 DIAGNOSIS — G4733 Obstructive sleep apnea (adult) (pediatric): Secondary | ICD-10-CM

## 2021-03-17 NOTE — Telephone Encounter (Signed)
Called and spoke to patient in regards to PFT results. Patient had a clear understanding. Patient wanted to know if there was a steroid inhaler that she could try to help get ger through the day, states nebulizer are working well but just needs an inhaler to support her through the whole day. States she has not had her ONO. ?

## 2021-03-29 NOTE — Telephone Encounter (Signed)
Spoke to patient. 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  ?Will await call back ?

## 2021-03-29 NOTE — Telephone Encounter (Signed)
If she would like to see if she no longer needs oxygen we can check ONO on CPAP. Otherwise would cont to wear 2L at bedtime

## 2021-03-29 NOTE — Telephone Encounter (Signed)
Spoke to patient and relayed below message. She voiced her understanding.  ?She stated that she does have an albuterol inhaler.  ?She currently wears 2L with cpap at night.  ? ?Beth, would you like ONO on cpap? ?

## 2021-03-29 NOTE — Telephone Encounter (Signed)
Budesonide (pulmicort) is a steroid in nebulizer form. Not able to be on both. We can send in albuterol rescue inhaler that she can use every 6 hours for breakthrough symptoms if she does not have one. Can we check on ONO order

## 2021-03-29 NOTE — Telephone Encounter (Signed)
Patient is returning phone call. Patient available tomorrow 10:30 am to 12:00 pm. Patient phone number is [phone]. ?

## 2021-03-30 NOTE — Telephone Encounter (Addendum)
ATC patient- unable to leave vm due to mailbox being full.   

## 2021-03-31 NOTE — Telephone Encounter (Signed)
ATC x2--unable to leave vm due to mailbox being full. ?Letter mailed to address on file.  ?Will close encounter per office protocol.  ? ?

## 2021-05-03 ENCOUNTER — Ambulatory Visit: Payer: BC Managed Care – PPO | Admitting: Primary Care

## 2021-05-04 ENCOUNTER — Other Ambulatory Visit: Payer: Self-pay | Admitting: Internal Medicine

## 2021-05-04 ENCOUNTER — Ambulatory Visit
Admission: RE | Admit: 2021-05-04 | Discharge: 2021-05-04 | Disposition: A | Discharge status: Home / Self Care | Payer: BC Managed Care – PPO | Source: Ambulatory Visit | Attending: Internal Medicine | Admitting: Internal Medicine

## 2021-05-04 DIAGNOSIS — R109 Unspecified abdominal pain: Secondary | ICD-10-CM | POA: Insufficient documentation

## 2021-05-04 DIAGNOSIS — N200 Calculus of kidney: Secondary | ICD-10-CM | POA: Diagnosis present

## 2021-05-08 ENCOUNTER — Encounter: Payer: Self-pay | Admitting: Physician Assistant

## 2021-05-08 ENCOUNTER — Ambulatory Visit (INDEPENDENT_AMBULATORY_CARE_PROVIDER_SITE_OTHER): Payer: BC Managed Care – PPO | Admitting: Physician Assistant

## 2021-05-08 VITALS — BP 120/72 | HR 93 | Ht 65.0 in | Wt 220.0 lb

## 2021-05-08 DIAGNOSIS — Z87442 Personal history of urinary calculi: Secondary | ICD-10-CM

## 2021-05-08 DIAGNOSIS — R109 Unspecified abdominal pain: Secondary | ICD-10-CM | POA: Diagnosis not present

## 2021-05-08 DIAGNOSIS — N281 Cyst of kidney, acquired: Secondary | ICD-10-CM | POA: Diagnosis not present

## 2021-05-08 LAB — URINALYSIS, COMPLETE
Bilirubin, UA: NEGATIVE
Glucose, UA: NEGATIVE
Ketones, UA: NEGATIVE
Nitrite, UA: NEGATIVE
Protein,UA: NEGATIVE
Specific Gravity, UA: 1.025 (ref 1.005–1.030)
Urobilinogen, Ur: 0.2 mg/dL (ref 0.2–1.0)
pH, UA: 6 (ref 5.0–7.5)

## 2021-05-08 LAB — MICROSCOPIC EXAMINATION

## 2021-05-08 NOTE — Patient Instructions (Signed)
Dietary Guidelines to Help Prevent Kidney Stones Kidney stones are deposits of minerals and salts that form inside your kidneys. Your risk of developing kidney stones may be greater depending on your diet, your lifestyle, the medicines you take, and whether you have certain medical conditions. Most people can lower their chances of developing kidney stones by following the instructions below. Your dietitian may give you more specific instructions depending on your overall health and the type of kidney stones you tend to develop. What are tips for following this plan? Reading food labels  Choose foods with "no salt added" or "low-salt" labels. Limit your salt (sodium) intake to less than 1,500 mg a day. Choose foods with calcium for each meal and snack. Try to eat about 300 mg of calcium at each meal. Foods that contain 200-500 mg of calcium a serving include: 8 oz (237 mL) of milk, calcium-fortifiednon-dairy milk, and calcium-fortifiedfruit juice. Calcium-fortified means that calcium has been added to these drinks. 8 oz (237 mL) of kefir, yogurt, and soy yogurt. 4 oz (114 g) of tofu. 1 oz (28 g) of cheese. 1 cup (150 g) of dried figs. 1 cup (91 g) of cooked broccoli. One 3 oz (85 g) can of sardines or mackerel. Most people need 1,000-1,500 mg of calcium a day. Talk to your dietitian about how much calcium is recommended for you. Shopping Buy plenty of fresh fruits and vegetables. Most people do not need to avoid fruits and vegetables, even if these foods contain nutrients that may contribute to kidney stones. When shopping for convenience foods, choose: Whole pieces of fruit. Pre-made salads with dressing on the side. Low-fat fruit and yogurt smoothies. Avoid buying frozen meals or prepared deli foods. These can be high in sodium. Look for foods with live cultures, such as yogurt and kefir. Choose high-fiber grains, such as whole-wheat breads, oat bran, and wheat cereals. Cooking Do not add  salt to food when cooking. Place a salt shaker on the table and allow each person to add his or her own salt to taste. Use vegetable protein, such as beans, textured vegetable protein (TVP), or tofu, instead of meat in pasta, casseroles, and soups. Meal planning Eat less salt, if told by your dietitian. To do this: Avoid eating processed or pre-made food. Avoid eating fast food. Eat less animal protein, including cheese, meat, poultry, or fish, if told by your dietitian. To do this: Limit the number of times you have meat, poultry, fish, or cheese each week. Eat a diet free of meat at least 2 days a week. Eat only one serving each day of meat, poultry, fish, or seafood. When you prepare animal protein, cut pieces into small portion sizes. For most meat and fish, one serving is about the size of the palm of your hand. Eat at least five servings of fresh fruits and vegetables each day. To do this: Keep fruits and vegetables on hand for snacks. Eat one piece of fruit or a handful of berries with breakfast. Have a salad and fruit at lunch. Have two kinds of vegetables at dinner. Limit foods that are high in a substance called oxalate. These include: Spinach (cooked), rhubarb, beets, sweet potatoes, and Swiss chard. Peanuts. Potato chips, french fries, and baked potatoes with skin on. Nuts and nut products. Chocolate. If you regularly take a diuretic medicine, make sure to eat at least 1 or 2 servings of fruits or vegetables that are high in potassium each day. These include: Avocado. Banana. Orange, prune,   carrot, or tomato juice. Baked potato. Cabbage. Beans and split peas. Lifestyle  Drink enough fluid to keep your urine pale yellow. This is the most important thing you can do. Spread your fluid intake throughout the day. If you drink alcohol: Limit how much you use to: 0-1 drink a day for women who are not pregnant. 0-2 drinks a day for men. Be aware of how much alcohol is in your  drink. In the [place] one drink equals one 12 oz bottle of beer (355 mL), one 5 oz glass of wine (148 mL), or one 1 oz glass of hard liquor (44 mL). Lose weight if told by your health care provider. Work with your dietitian to find an eating plan and weight loss strategies that work best for you. General information Talk to your health care provider and dietitian about taking daily supplements. You may be told the following depending on your health and the cause of your kidney stones: Not to take supplements with vitamin C. To take a calcium supplement. To take a daily probiotic supplement. To take other supplements such as magnesium, fish oil, or vitamin B6. Take over-the-counter and prescription medicines only as told by your health care provider. These include supplements. What foods should I limit? Limit your intake of the following foods, or eat them as told by your dietitian. Vegetables Spinach. Rhubarb. Beets. Canned vegetables. Pickles. Olives. Baked potatoes with skin. Grains Wheat bran. Baked goods. Salted crackers. Cereals high in sugar. Meats and other proteins Nuts. Nut butters. Large portions of meat, poultry, or fish. Salted, precooked, or cured meats, such as sausages, meat loaves, and hot dogs. Dairy Cheese. Beverages Regular soft drinks. Regular vegetable juice. Seasonings and condiments Seasoning blends with salt. Salad dressings. Soy sauce. Ketchup. Barbecue sauce. Other foods Canned soups. Canned pasta sauce. Casseroles. Pizza. Lasagna. Frozen meals. Potato chips. French fries. The items listed above may not be a complete list of foods and beverages you should limit. Contact a dietitian for more information. What foods should I avoid? Talk to your dietitian about specific foods you should avoid based on the type of kidney stones you have and your overall health. Fruits Grapefruit. The item listed above may not be a complete list of foods and beverages you should  avoid. Contact a dietitian for more information. Summary Kidney stones are deposits of minerals and salts that form inside your kidneys. You can lower your risk of kidney stones by making changes to your diet. The most important thing you can do is drink enough fluid. Drink enough fluid to keep your urine pale yellow. Talk to your dietitian about how much calcium you should have each day, and eat less salt and animal protein as told by your dietitian. This information is not intended to replace advice given to you by your health care provider. Make sure you discuss any questions you have with your health care provider. Document Revised: 09/05/2020 Document Reviewed: 09/05/2020 Elsevier Patient Education  2023 [employer]  

## 2021-05-08 NOTE — Progress Notes (Signed)
? ?05/08/2021 ?10:23 AM  ? ?Brenda Craig ?01/02/1961 ?[id number] ? ?CC: ?Chief Complaint  ?Patient presents with  ? Nephrolithiasis  ? ?HPI: ?Brenda Craig is a 61 y.o. female with PMH nephrolithiasis with an episode of urosepsis due to obstructing left UPJ calculus in 2021, celiac disease, and back problems who presents today for evaluation of possible acute stone episode.  ? ?Today she reports she thinks she may have accidentally ingested gluten 6 days ago, with rapid onset of abdominal bloating, cramping, and GI distress.  She had some left flank pain radiating to the LLQ associated with this.  She saw her PCP 4 days ago for evaluation of this.  She reports there was blood noted on UA dip, microscopy result unknown.  Urine culture finalized with mixed urogenital flora.   ? ?She underwent CT stone study that day which revealed a punctate left lower pole stone and slight interval enlargement of her right upper pole stone.  There was no hydronephrosis, perinephric stranding, or perivesical stranding noted. ? ?In-office UA today positive for trace intact blood and trace leukocyte esterase; urine microscopy with moderate bacteria.   ? ?PMH: ?Past Medical History:  ?Diagnosis Date  ? Abnormal Pap smear of cervix 1988  ? Acid reflux 01/17/2013  ? Allergic rhinitis   ? Anemia   ? Anginal pain ([place])   ? Anxiety   ? Arthritis   ? Asthma   ? Celiac disease   ? Cervical dysplasia 1988  ? CHF (congestive heart failure) ([place])   ? Complication of anesthesia   ? bp drops after anesthesia and has to be kept overnight  ? Coronary artery disease   ? Headache   ? migraine with visual aura  ? Heart trouble   ? History of cardiac arrest   ? History of hiatal hernia   ? History of kidney stones   ? History of mammogram 03/25/2013; 12/07/14  ? BIRADS 1; NEG  ? History of Papanicolaou smear of cervix 03/20/12; 12/28/15  ? -/-; -/-  ? Hypercholesteremia   ? Hypertension   ? IBS (irritable bowel syndrome)   ? Menopausal symptoms    ? MI (myocardial infarction) ([place])   ? Mitral valve prolapse   ? Pneumonia   ? PONV (postoperative nausea and vomiting)   ? nausea only  ? Sleep apnea   ? Vulvovaginitis   ? CHRONIC VULVAR [place] SOME RELIEF  ? ? ?Surgical History: ?Past Surgical History:  ?Procedure Laterality Date  ? ABLATION  2013  ? CCK  ? BACK SURGERY    ? BACK SURGERY  2016; 2017  ? L4, L5  ? CARDIAC SURGERY  03/19/12; 10/2014  ? HEART CATH AND STENT  ? CESAREAN SECTION    ? 57; 1989  ? COLD KNIFE CONE BIOPSY  1988  ? COLONOSCOPY  08/2010  ? 16 POLYPS (ALL BENIGN) DX C CELIAC DISEASE  ? COLONOSCOPY N/A 02/17/2021  ? Procedure: COLONOSCOPY;  Surgeon: [name], MD;  Location: [facility] ENDOSCOPY;  Service: Endoscopy;  Laterality: N/A;  ? COMBINED HYSTEROSCOPY DIAGNOSTIC / D&C  2013  ? CCK  ? CYSTOSCOPY W/ RETROGRADES Left 01/20/2019  ? Procedure: CYSTOSCOPY WITH RETROGRADE PYELOGRAM;  Surgeon: Abbie Sons, MD;  Location: [facility] ORS;  Service: Urology;  Laterality: Left;  ? CYSTOSCOPY W/ URETERAL STENT PLACEMENT Left 12/13/2018  ? Procedure: CYSTOSCOPY WITH RETROGRADE PYELOGRAM/URETERAL STENT PLACEMENT;  Surgeon: Irine Seal, MD;  Location: [facility] ORS;  Service: Urology;  Laterality: Left;  ? CYSTOSCOPY/URETEROSCOPY/HOLMIUM LASER/STENT PLACEMENT Left 01/20/2019  ? Procedure: CYSTOSCOPY/URETEROSCOPY/HOLMIUM LASER/STENT EXCHANGE;  Surgeon: Abbie Sons, MD;  Location: [facility] ORS;  Service: Urology;  Laterality: Left;  ? [place] OF UTERUS  2001  ? ESOPHAGOGASTRODUODENOSCOPY  08/2010  ? DX C CELIAC DISEASE  ? ESOPHAGOGASTRODUODENOSCOPY N/A 02/17/2021  ? Procedure: ESOPHAGOGASTRODUODENOSCOPY (EGD);  Surgeon: [name], MD;  Location: [facility] ENDOSCOPY;  Service: Endoscopy;  Laterality: N/A;  ? HEART STENTS  05/27/2011  ? HIP SURGERY  2016  ? RIGHT IT BAND AND BURSA REMOVAL  ? OOPHORECTOMY Right 1982  ? TOTAL KNEE ARTHROPLASTY Left 01/19/2020  ? Procedure: TOTAL KNEE ARTHROPLASTY;  Surgeon: Renette Butters, MD;  Location: [facility] ORS;  Service: Orthopedics;  Laterality: Left;  ? ? ?Home Medications:  ?Allergies as of 05/08/2021   ? ?   Reactions  ? Contrast Media [iodinated Contrast Media] Itching  ? Other   ? Gluten allergy  ? Codeine Nausea And Vomiting, Rash  ? Nexium [esomeprazole Magnesium] Palpitations  ? Omeprazole Magnesium Palpitations  ? Oxycodone-acetaminophen Itching, Nausea And Vomiting, Rash  ? Percocet [oxycodone-acetaminophen] Itching, Nausea And Vomiting, Rash  ? Can take with benadryl   ? Tramadol Rash  ? Can take with benadryl   ? ?  ? ?  ?Medication List  ?  ? ?  ? Accurate as of May 08, 2021 10:23 AM. If you have any questions, ask your nurse or doctor.  ?  ?  ? ?  ? ?STOP taking these medications   ? ?benzonatate 200 MG capsule ?Commonly known as: TESSALON ?Stopped by: Debroah Loop, PA-C ?  ?chlorpheniramine-HYDROcodone 10-8 MG/5ML [place] ?Commonly known as: [address] ?Stopped by: Debroah Loop, PA-C ?  ?montelukast 10 MG tablet ?Commonly known as: SINGULAIR ?Stopped by: Debroah Loop, PA-C ?  ? ?  ? ?TAKE these medications   ? ?albuterol 108 (90 Base) MCG/ACT inhaler ?Commonly known as: VENTOLIN HFA ?Inhale 1-2 puffs into the lungs every 4 (four) hours as needed for wheezing or shortness of breath. ?  ?albuterol (2.5 MG/3ML) 0.083% nebulizer solution ?Commonly known as: PROVENTIL ?Take 3 mLs (2.5 mg total) by nebulization every 6 (six) hours as needed for wheezing or shortness of breath. ?  ?amLODipine 5 MG tablet ?Commonly known as: NORVASC ?Take 5 mg by mouth daily. ?  ?budesonide 0.5 MG/2ML nebulizer solution ?Commonly known as: Pulmicort ?Take 2 mLs (0.5 mg total) by nebulization in the morning and at bedtime. ?  ?clotrimazole-betamethasone cream ?Commonly known as: LOTRISONE ?APPLY TO AFFECTED AREA TWICE A DAY AS NEEDED FOR SYMPTOMS FOR UP TO 2 WEEKS ?  ?[employer] Aspirin Adult Low Dose 81 MG chewable tablet ?Generic drug: aspirin ?Chew 81 mg by mouth daily. ?   ?diphenhydrAMINE 50 MG tablet ?Commonly known as: BENADRYL ?Take 0.5-1 tablets (25-50 mg total) by mouth every 6 (six) hours as needed for itching (Take with oxycodone). ?  ?diphenoxylate-atropine 2.5-0.025 MG tablet ?Commonly known as: LOMOTIL ?Take 1 tablet by mouth every 4 (four) hours as needed. ?  ?ergocalciferol 1.25 MG (50000 UT) capsule ?Commonly known as: VITAMIN D2 ?Take 1 capsule by mouth once a week. ?  ?famotidine 40 MG tablet ?Commonly known as: PEPCID ?Take 40 mg by mouth at bedtime. ?  ?ferrous sulfate 325 (65 FE) MG tablet ?Take 325 mg by mouth every morning. ?  ?LORazepam 1 MG tablet ?Commonly known as: ATIVAN ?Take 1 mg by mouth 2 (two) times daily. ?  ?losartan 25 MG tablet ?Commonly  known as: COZAAR ?Take 1 tablet (25 mg total) by mouth daily. ?  ?Nitrostat 0.4 MG SL tablet ?Generic drug: nitroGLYCERIN ?Place 0.4 mg under the tongue every 5 (five) minutes as needed for chest pain. ?  ?oxyCODONE-acetaminophen 5-325 MG tablet ?Commonly known as: PERCOCET/ROXICET ?Take 1 tablet by mouth every 4 (four) hours as needed. ?  ?pantoprazole 40 MG tablet ?Commonly known as: PROTONIX ?Take 40 mg by mouth every morning. ?  ?rosuvastatin 10 MG tablet ?Commonly known as: CRESTOR ?Take 10 mg by mouth at bedtime. ?  ?tamsulosin 0.4 MG Caps capsule ?Commonly known as: FLOMAX ?Take 0.4 mg by mouth daily. ?  ? ?  ? ? ?Allergies:  ?Allergies  ?Allergen Reactions  ? Contrast Media [Iodinated Contrast Media] Itching  ? Other   ?  Gluten allergy  ? Codeine Nausea And Vomiting and Rash  ? Nexium [Esomeprazole Magnesium] Palpitations  ? Omeprazole Magnesium Palpitations  ? Oxycodone-Acetaminophen Itching, Nausea And Vomiting and Rash  ? Percocet [Oxycodone-Acetaminophen] Itching, Nausea And Vomiting and Rash  ?  Can take with benadryl   ? Tramadol Rash  ?  Can take with benadryl   ? ? ?Family History: ?Family History  ?Problem Relation Age of Onset  ? CAD Father   ? Transient ischemic attack Father   ? Diabetes Father    ? Cerebrovascular Accident Father   ? Heart disease Father   ?     M-MVP; F BETA;BLOCKERS  ? Hypothyroidism Father   ? Hyperthyroidism Mother   ? Cancer Maternal Uncle   ?     KIDNEY  ? Uterine cancer Maternal Aunt

## 2021-06-09 ENCOUNTER — Inpatient Hospital Stay: Payer: BC Managed Care – PPO | Attending: Oncology | Admitting: Oncology

## 2021-06-09 ENCOUNTER — Encounter: Payer: Self-pay | Admitting: Oncology

## 2021-06-09 ENCOUNTER — Inpatient Hospital Stay: Payer: BC Managed Care – PPO

## 2021-06-09 VITALS — BP 113/82 | HR 78 | Temp 97.8°F | Wt 222.0 lb

## 2021-06-09 DIAGNOSIS — Z8051 Family history of malignant neoplasm of kidney: Secondary | ICD-10-CM | POA: Diagnosis not present

## 2021-06-09 DIAGNOSIS — E7800 Pure hypercholesterolemia, unspecified: Secondary | ICD-10-CM | POA: Diagnosis not present

## 2021-06-09 DIAGNOSIS — Z87442 Personal history of urinary calculi: Secondary | ICD-10-CM | POA: Diagnosis not present

## 2021-06-09 DIAGNOSIS — Z8674 Personal history of sudden cardiac arrest: Secondary | ICD-10-CM | POA: Diagnosis not present

## 2021-06-09 DIAGNOSIS — I2510 Atherosclerotic heart disease of native coronary artery without angina pectoris: Secondary | ICD-10-CM | POA: Diagnosis not present

## 2021-06-09 DIAGNOSIS — J45909 Unspecified asthma, uncomplicated: Secondary | ICD-10-CM | POA: Insufficient documentation

## 2021-06-09 DIAGNOSIS — E611 Iron deficiency: Secondary | ICD-10-CM

## 2021-06-09 DIAGNOSIS — Z90721 Acquired absence of ovaries, unilateral: Secondary | ICD-10-CM | POA: Diagnosis not present

## 2021-06-09 DIAGNOSIS — R5383 Other fatigue: Secondary | ICD-10-CM | POA: Diagnosis not present

## 2021-06-09 DIAGNOSIS — Z823 Family history of stroke: Secondary | ICD-10-CM | POA: Insufficient documentation

## 2021-06-09 DIAGNOSIS — Z8249 Family history of ischemic heart disease and other diseases of the circulatory system: Secondary | ICD-10-CM | POA: Diagnosis not present

## 2021-06-09 DIAGNOSIS — Z8349 Family history of other endocrine, nutritional and metabolic diseases: Secondary | ICD-10-CM | POA: Diagnosis not present

## 2021-06-09 DIAGNOSIS — K449 Diaphragmatic hernia without obstruction or gangrene: Secondary | ICD-10-CM | POA: Diagnosis not present

## 2021-06-09 DIAGNOSIS — Z833 Family history of diabetes mellitus: Secondary | ICD-10-CM | POA: Insufficient documentation

## 2021-06-09 DIAGNOSIS — I10 Essential (primary) hypertension: Secondary | ICD-10-CM | POA: Diagnosis not present

## 2021-06-09 DIAGNOSIS — Z8049 Family history of malignant neoplasm of other genital organs: Secondary | ICD-10-CM | POA: Insufficient documentation

## 2021-06-09 DIAGNOSIS — Z885 Allergy status to narcotic agent status: Secondary | ICD-10-CM | POA: Diagnosis not present

## 2021-06-09 DIAGNOSIS — G4730 Sleep apnea, unspecified: Secondary | ICD-10-CM | POA: Insufficient documentation

## 2021-06-09 DIAGNOSIS — Z79899 Other long term (current) drug therapy: Secondary | ICD-10-CM | POA: Diagnosis not present

## 2021-06-09 DIAGNOSIS — K900 Celiac disease: Secondary | ICD-10-CM | POA: Insufficient documentation

## 2021-06-09 DIAGNOSIS — K219 Gastro-esophageal reflux disease without esophagitis: Secondary | ICD-10-CM | POA: Insufficient documentation

## 2021-06-09 DIAGNOSIS — R21 Rash and other nonspecific skin eruption: Secondary | ICD-10-CM | POA: Insufficient documentation

## 2021-06-09 DIAGNOSIS — F419 Anxiety disorder, unspecified: Secondary | ICD-10-CM | POA: Insufficient documentation

## 2021-06-09 DIAGNOSIS — I252 Old myocardial infarction: Secondary | ICD-10-CM | POA: Insufficient documentation

## 2021-06-09 DIAGNOSIS — Z888 Allergy status to other drugs, medicaments and biological substances status: Secondary | ICD-10-CM | POA: Diagnosis not present

## 2021-06-09 LAB — CBC WITH DIFFERENTIAL/PLATELET
Abs Immature Granulocytes: 0.02 10*3/uL (ref 0.00–0.07)
Basophils Absolute: 0.1 10*3/uL (ref 0.0–0.1)
Basophils Relative: 1 %
Eosinophils Absolute: 0.1 10*3/uL (ref 0.0–0.5)
Eosinophils Relative: 1 %
HCT: 42.2 % (ref 36.0–46.0)
Hemoglobin: 13.3 g/dL (ref 12.0–15.0)
Immature Granulocytes: 0 %
Lymphocytes Relative: 31 %
Lymphs Abs: 1.9 10*3/uL (ref 0.7–4.0)
MCH: 27.2 pg (ref 26.0–34.0)
MCHC: 31.5 g/dL (ref 30.0–36.0)
MCV: 86.3 fL (ref 80.0–100.0)
Monocytes Absolute: 0.5 10*3/uL (ref 0.1–1.0)
Monocytes Relative: 9 %
Neutro Abs: 3.7 10*3/uL (ref 1.7–7.7)
Neutrophils Relative %: 58 %
Platelets: 226 10*3/uL (ref 150–400)
RBC: 4.89 MIL/uL (ref 3.87–5.11)
RDW: 15.7 % — ABNORMAL HIGH (ref 11.5–15.5)
WBC: 6.3 10*3/uL (ref 4.0–10.5)
nRBC: 0 % (ref 0.0–0.2)

## 2021-06-09 LAB — IRON AND TIBC
Iron: 118 ug/dL (ref 28–170)
Saturation Ratios: 27 % (ref 10.4–31.8)
TIBC: 433 ug/dL (ref 250–450)
UIBC: 315 ug/dL

## 2021-06-09 LAB — RETIC PANEL
Immature Retic Fract: 10.6 % (ref 2.3–15.9)
RBC.: 4.85 MIL/uL (ref 3.87–5.11)
Retic Count, Absolute: 79.1 10*3/uL (ref 19.0–186.0)
Retic Ct Pct: 1.6 % (ref 0.4–3.1)
Reticulocyte Hemoglobin: 31.5 pg (ref >27.9)

## 2021-06-09 LAB — FERRITIN: Ferritin: 14 ng/mL (ref 11–307)

## 2021-06-09 NOTE — Progress Notes (Signed)
Hematology/Oncology Consult note [phone] [phone]         Patient Care Team: Albina Billet, MD as PCP - General (Internal Medicine)  REFERRING PROVIDER: Geanie Kenning, PA*  CHIEF COMPLAINTS/REASON FOR VISIT:  Evaluation of iron deficiency  HISTORY OF PRESENTING ILLNESS:   Brenda Craig is a  61 y.o.  female with PMH listed below was seen in consultation at the request of  Geanie Kenning, [place]*  for evaluation of iron deficiency. Patient has a history of celiac disease, she also has a history of iron deficiency. recent EGD negative for active disease and tTG IgA negative suggests good compliance with GFD She has tried oral iron supplementation and did not tolerate due to GI side effects.  She was referred by gastroenterology for evaluation of iron deficiency and consideration of iron iron treatments. Patient reports history of IV iron infusion..  After iron infusion, she developed a mild infusion reactions with skin itchiness/rash.  Symptoms were improved after Benadryl.  She was able to finish iron infusions with Benadryl as premed made an approximate.  Patient has history of coronary artery disease/STEMI, status post PCI x2. reports feeling tired Denies any black or bloody stool.  EGD: 02/17/2021 - 6 cm hiatal hernia, few fundic gland polyps in gastric body, normal examined duodenum with vili intact and no evidence of CD Colonoscopy: 02/17/2021 - one 5 mm TA removed from ascending colon  Review of Systems  Constitutional:  Positive for fatigue. Negative for appetite change, chills and fever.  HENT:   Negative for hearing loss and voice change.   Eyes:  Negative for eye problems.  Respiratory:  Negative for chest tightness and cough.   Cardiovascular:  Negative for chest pain.  Gastrointestinal:  Negative for abdominal distention, abdominal pain and blood in stool.  Endocrine: Negative for hot flashes.  Genitourinary:  Negative for  difficulty urinating and frequency.   Musculoskeletal:  Negative for arthralgias.  Skin:  Negative for itching and rash.  Neurological:  Negative for extremity weakness.  Hematological:  Negative for adenopathy.  Psychiatric/Behavioral:  Negative for confusion.    MEDICAL HISTORY:  Past Medical History:  Diagnosis Date   Abnormal Pap smear of cervix 1988   Acid reflux 01/17/2013   Allergic rhinitis    Anemia    Anginal pain ([facility])    Anxiety    Arthritis    Asthma    Celiac disease    Cervical dysplasia 1988   CHF (congestive heart failure) ([facility])    Complication of anesthesia    bp drops after anesthesia and has to be kept overnight   Coronary artery disease    Headache    migraine with visual aura   Heart trouble    History of cardiac arrest    History of hiatal hernia    History of kidney stones    History of mammogram 03/25/2013; 12/07/14   BIRADS 1; NEG   History of Papanicolaou smear of cervix 03/20/12; 12/28/15   -/-; -/-   Hypercholesteremia    Hypertension    IBS (irritable bowel syndrome)    Menopausal symptoms    MI (myocardial infarction) ([place])    Mitral valve prolapse    Pneumonia    PONV (postoperative nausea and vomiting)    nausea only   Sleep apnea    Vulvovaginitis    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    SURGICAL HISTORY: Past Surgical History:  Procedure Laterality Date  ABLATION  2013   CCK   BACK SURGERY     BACK SURGERY  2016; 2017   L4, L5   CARDIAC SURGERY  03/19/12; 10/2014   HEART CATH AND STENT   CESAREAN SECTION     1986; [place]   COLONOSCOPY  08/2010   16 POLYPS (ALL BENIGN) DX C CELIAC DISEASE   COLONOSCOPY N/A 02/17/2021   Procedure: COLONOSCOPY;  Surgeon: [name], MD;  Location: [facility] ENDOSCOPY;  Service: Endoscopy;  Laterality: N/A;   COMBINED HYSTEROSCOPY DIAGNOSTIC / D&C  2013   CCK   CYSTOSCOPY W/ RETROGRADES Left 01/20/2019   Procedure: CYSTOSCOPY WITH  RETROGRADE PYELOGRAM;  Surgeon: Abbie Sons, MD;  Location: [facility] ORS;  Service: Urology;  Laterality: Left;   CYSTOSCOPY W/ URETERAL STENT PLACEMENT Left 12/13/2018   Procedure: CYSTOSCOPY WITH RETROGRADE PYELOGRAM/URETERAL STENT PLACEMENT;  Surgeon: Irine Seal, MD;  Location: [facility] ORS;  Service: Urology;  Laterality: Left;   CYSTOSCOPY/URETEROSCOPY/HOLMIUM LASER/STENT PLACEMENT Left 01/20/2019   Procedure: CYSTOSCOPY/URETEROSCOPY/HOLMIUM LASER/STENT EXCHANGE;  Surgeon: Abbie Sons, MD;  Location: [facility] ORS;  Service: Urology;  Laterality: Left;   DILATION AND CURETTAGE OF UTERUS  2001   ESOPHAGOGASTRODUODENOSCOPY  08/2010   DX C CELIAC DISEASE   ESOPHAGOGASTRODUODENOSCOPY N/A 02/17/2021   Procedure: ESOPHAGOGASTRODUODENOSCOPY (EGD);  Surgeon: [name], MD;  Location[facility] ENDOSCOPY;  Service: Endoscopy;  Laterality: N/A;   HEART STENTS  05/27/2011   HIP SURGERY  2016   RIGHT IT BAND AND BURSA REMOVAL   OOPHORECTOMY Right 1982   TOTAL KNEE ARTHROPLASTY Left 01/19/2020   Procedure: TOTAL KNEE ARTHROPLASTY;  Surgeon: Renette Butters, MD;  Location: [facility] ORS;  Service: Orthopedics;  Laterality: Left;    SOCIAL HISTORY: Social History   Socioeconomic History   Marital status: Divorced    Spouse name: Not on file   Number of children: 2   Years of education: 16   Highest education level: Not on file  Occupational History   Occupation: TEACHER  Tobacco Use   Smoking status: Never   Smokeless tobacco: Never  Vaping Use   Vaping Use: Never used  Substance and Sexual Activity   Alcohol use: Yes    Alcohol/week: 1.0 standard drink    Types: 1 Standard drinks or equivalent per week   Drug use: No   Sexual activity: Not Currently    Birth control/protection: Surgical    Comment: ABLATION  Other Topics Concern   Not on file  Social History Narrative   Not on file   Social Determinants of Health   Financial Resource Strain: Not on file  Food Insecurity: Not on  file  Transportation Needs: Not on file  Physical Activity: Not on file  Stress: Not on file  Social Connections: Not on file  Intimate Partner Violence: Not on file    FAMILY HISTORY: Family History  Problem Relation Age of Onset   CAD Father    Transient ischemic attack Father    Diabetes Father    Cerebrovascular Accident Father    Heart disease Father        M-MVP; F BETA;BLOCKERS   Hypothyroidism Father    Hyperthyroidism Mother    Cancer Maternal Uncle        KIDNEY   Uterine cancer Maternal Aunt 69    ALLERGIES:  is allergic to contrast media [iodinated contrast media], other, codeine, nexium [esomeprazole magnesium], omeprazole magnesium, oxycodone-acetaminophen, percocet [oxycodone-acetaminophen], and tramadol.  MEDICATIONS:  Current Outpatient Medications  Medication Sig Dispense Refill   albuterol (PROVENTIL) (2.5 MG/3ML) 0.083% nebulizer solution Take 3 mLs (2.5 mg total) by nebulization every 6 (six) hours as needed for wheezing or shortness of breath. 240 mL 2   albuterol (VENTOLIN HFA) 108 (90 Base) MCG/ACT inhaler Inhale 1-2 puffs into the lungs every 4 (four) hours as needed for wheezing or shortness of breath.     amLODipine (NORVASC) 5 MG tablet Take 5 mg by mouth daily.     budesonide (PULMICORT) 0.5 MG/2ML nebulizer solution Take 2 mLs (0.5 mg total) by nebulization in the morning and at bedtime. 120 mL 2   clotrimazole-betamethasone (LOTRISONE) cream APPLY TO AFFECTED AREA TWICE A DAY AS NEEDED FOR SYMPTOMS FOR UP TO 2 WEEKS 45 g 1   CVS ASPIRIN ADULT LOW DOSE 81 MG chewable tablet Chew 81 mg by mouth daily.     diphenhydrAMINE (BENADRYL) 50 MG tablet Take 0.5-1 tablets (25-50 mg total) by mouth every 6 (six) hours as needed for itching (Take with oxycodone). 60 tablet 0   diphenoxylate-atropine (LOMOTIL) 2.5-0.025 MG tablet Take 1 tablet by mouth every 4 (four) hours as needed.     ergocalciferol (VITAMIN D2) 1.25 MG (50000 UT) capsule Take 1 capsule by  mouth once a week.     famotidine (PEPCID) 40 MG tablet Take 40 mg by mouth at bedtime.     ferrous sulfate 325 (65 FE) MG tablet Take 325 mg by mouth every morning.     LORazepam (ATIVAN) 1 MG tablet Take 1 mg by mouth 2 (two) times daily.     losartan (COZAAR) 25 MG tablet Take 1 tablet by mouth daily.     NITROSTAT 0.4 MG SL tablet Place 0.4 mg under the tongue every 5 (five) minutes as needed for chest pain.      oxyCODONE-acetaminophen (PERCOCET/ROXICET) 5-325 MG tablet Take 1 tablet by mouth every 4 (four) hours as needed.     pantoprazole (PROTONIX) 40 MG tablet Take 40 mg by mouth every morning.     rosuvastatin (CRESTOR) 10 MG tablet Take 10 mg by mouth at bedtime.     tamsulosin (FLOMAX) 0.4 MG CAPS capsule Take 0.4 mg by mouth daily.     losartan (COZAAR) 25 MG tablet Take 1 tablet (25 mg total) by mouth daily. 90 tablet 3   No current facility-administered medications for this visit.     PHYSICAL EXAMINATION:  Vitals:   06/09/21 1112  BP: 113/82  Pulse: 78  Temp: 97.8 F (36.6 C)   Filed Weights   06/09/21 1112  Weight: 222 lb (100.7 kg)    Physical Exam  LABORATORY DATA:  I have reviewed the data as listed Lab Results  Component Value Date   WBC 6.3 06/09/2021   HGB 13.3 06/09/2021   HCT 42.2 06/09/2021   MCV 86.3 06/09/2021   PLT 226 06/09/2021   No results for input(s): NA, K, CL, CO2, GLUCOSE, BUN, CREATININE, CALCIUM, GFRNONAA, GFRAA, PROT, ALBUMIN, AST, ALT, ALKPHOS, BILITOT, BILIDIR, IBILI in the last 8760 hours. Iron/TIBC/Ferritin/ %Sat    Component Value Date/Time   IRON 118 06/09/2021 1147   TIBC 433 06/09/2021 1147   FERRITIN 14 06/09/2021 1147   IRONPCTSAT 27 06/09/2021 1147      RADIOGRAPHIC STUDIES: I have personally reviewed the radiological images as listed and agreed with the findings in the report. No results found.    ASSESSMENT & PLAN:  1. Iron deficiency   2. Celiac disease    #  Iron deficiency secondary to her celiac  disease.  Not able to tolerate oral iron supplementation Check CBC, iron TIBC ferritin reticulocyte panel. We discussed about IV Venofer treatment option. Risks of infusion reactions including anaphylactic reactions were discussed with patient. Other side effects include but not limited to high blood pressure, headache,wheezing, SOB, skin rash and itchiness, weight gain, leg swelling, etc. Patient voices understanding and is willing to proceed if she needs to..  Today's blood work results are available after patient's encounter. Patient has a normal hemoglobin, normal iron saturation and ferritin.  Ferritin is at the low normal level. Given that she is not able to tolerate oral iron and has celiac disease which increase her risk of developing iron deficiency in the future, I think proceeding with a dose of IV Venofer for  maintenance is reasonable. We will schedule patient to have IV Venofer 200 mg x 1. She will follow-up in 6 months.  Orders Placed This Encounter  Procedures   CBC with Differential/Platelet    Standing Status:   Future    Number of Occurrences:   1    Standing Expiration Date:   06/10/2022   Ferritin    Standing Status:   Future    Number of Occurrences:   1    Standing Expiration Date:   06/10/2022   Iron and TIBC    Standing Status:   Future    Number of Occurrences:   1    Standing Expiration Date:   06/10/2022   Retic Panel    Standing Status:   Future    Number of Occurrences:   1    Standing Expiration Date:   06/10/2022   CBC with Differential/Platelet    Standing Status:   Future    Standing Expiration Date:   06/10/2022   Ferritin    Standing Status:   Future    Standing Expiration Date:   06/10/2022   Retic Panel    Standing Status:   Future    Standing Expiration Date:   06/10/2022   Iron and TIBC    Standing Status:   Future    Standing Expiration Date:   06/10/2022    All questions were answered. The patient knows to call the clinic with any problems questions  or concerns.   Gerarda Gunther [place], [place]*    Return of visit: 6 months.  Thank you for this kind referral and the opportunity to participate in the care of this patient. A copy of today's note is routed to referring provider   Earlie Server, MD, PhD [facility] Hematology Oncology 06/09/2021

## 2021-06-12 ENCOUNTER — Telehealth: Payer: Self-pay

## 2021-06-12 NOTE — Telephone Encounter (Signed)
-----   Message from Earlie Server, MD sent at 06/09/2021  8:59 PM EDT ----- Please let patient know that hemoglobin is normal, iron panel is within normal limits, her ferritin level is at the low normal end.  With her celiac disease, I think is reasonable to offer her 1 dose of IV Venofer treatment. Please arrange IV Venofer x1.  Keep current follow-up appointment.  Thank you

## 2021-06-12 NOTE — Telephone Encounter (Signed)
Called to inform patient of lab results and Dr. Collie Siad recommendation to have one Venofer tx. Informed patient of when her appt is. Patient verbalized understanding.

## 2021-06-16 ENCOUNTER — Inpatient Hospital Stay: Payer: BC Managed Care – PPO

## 2021-06-16 VITALS — BP 107/70 | HR 71 | Temp 99.5°F | Resp 18

## 2021-06-16 DIAGNOSIS — E611 Iron deficiency: Secondary | ICD-10-CM

## 2021-06-16 MED ORDER — SODIUM CHLORIDE 0.9 % IV SOLN: 200.0000 mg | Freq: Once | INTRAVENOUS | Status: DC

## 2021-06-16 MED ORDER — SODIUM CHLORIDE 0.9 % IV SOLN
Freq: Once | INTRAVENOUS | Status: AC
  Filled 2021-06-16: qty 250

## 2021-06-16 MED ORDER — DIPHENHYDRAMINE HCL 25 MG PO CAPS: 50.0000 mg | ORAL_CAPSULE | Freq: Every day | ORAL | Status: DC | PRN

## 2021-06-16 MED ORDER — IRON SUCROSE 20 MG/ML IV SOLN
200.0000 mg | Freq: Once | INTRAVENOUS | Status: AC
  Administered 2021-06-16: 200 mg via INTRAVENOUS
  Filled 2021-06-16: qty 10

## 2021-06-16 NOTE — Patient Instructions (Addendum)
[facility] CANCER CTR AT [place]  Discharge Instructions: Thank you for choosing [facility] to provide your oncology and hematology care.  If you have a lab appointment with the [facility], please go directly to the [facility] and check in at the registration area.  Wear comfortable clothing and clothing appropriate for easy access to any Portacath or PICC line.   We strive to give you quality time with your provider. You may need to reschedule your appointment if you arrive late (15 or more minutes).  Arriving late affects you and other patients whose appointments are after yours.  Also, if you miss three or more appointments without notifying the office, you may be dismissed from the clinic at the provider's discretion.      For prescription refill requests, have your pharmacy contact our office and allow 72 hours for refills to be completed.    Today you received the following chemotherapy and/or immunotherapy agents Venofer.      To help prevent nausea and vomiting after your treatment, we encourage you to take your nausea medication as directed.  BELOW ARE SYMPTOMS THAT SHOULD BE REPORTED IMMEDIATELY: *FEVER GREATER THAN 100.4 F (38 C) OR HIGHER *CHILLS OR SWEATING *NAUSEA AND VOMITING THAT IS NOT CONTROLLED WITH YOUR NAUSEA MEDICATION *UNUSUAL SHORTNESS OF BREATH *UNUSUAL BRUISING OR BLEEDING *URINARY PROBLEMS (pain or burning when urinating, or frequent urination) *BOWEL PROBLEMS (unusual diarrhea, constipation, pain near the anus) TENDERNESS IN MOUTH AND THROAT WITH OR WITHOUT PRESENCE OF ULCERS (sore throat, sores in mouth, or a toothache) UNUSUAL RASH, SWELLING OR PAIN  UNUSUAL VAGINAL DISCHARGE OR ITCHING   Items with * indicate a potential emergency and should be followed up as soon as possible or go to the Emergency Department if any problems should occur.  Please show the CHEMOTHERAPY ALERT CARD or IMMUNOTHERAPY ALERT CARD at check-in to  the Emergency Department and triage nurse.  Should you have questions after your visit or need to cancel or reschedule your appointment, please contact [facility] CANCER [place] AT [place]  [phone] and follow the prompts.  Office hours are 8:00 a.m. to 4:30 p.m. Monday - Friday. Please note that voicemails left after 4:00 p.m. may not be returned until the following business day.  We are closed weekends and major holidays. You have access to a nurse at all times for urgent questions. Please call the main number to the clinic [phone] and follow the prompts.  For any non-urgent questions, you may also contact your provider using MyChart. We now offer e-Visits for anyone 74 and older to request care online for non-urgent symptoms. For details visit [email or web address].   Also download the MyChart app! Go to the app store, search "MyChart", open the app, select [facility], and log in with your MyChart username and password.  Due to Covid, a mask is required upon entering the hospital/clinic. If you do not have a mask, one will be given to you upon arrival. For doctor visits, patients may have 1 support person aged 7 or older with them. For treatment visits, patients cannot have anyone with them due to current Covid guidelines and our immunocompromised population.

## 2021-08-03 ENCOUNTER — Ambulatory Visit (INDEPENDENT_AMBULATORY_CARE_PROVIDER_SITE_OTHER): Payer: BC Managed Care – PPO | Admitting: Obstetrics and Gynecology

## 2021-08-03 ENCOUNTER — Encounter: Payer: Self-pay | Admitting: Obstetrics and Gynecology

## 2021-08-03 VITALS — BP 120/80 | Ht 65.0 in | Wt 224.0 lb

## 2021-08-03 DIAGNOSIS — Z1231 Encounter for screening mammogram for malignant neoplasm of breast: Secondary | ICD-10-CM | POA: Diagnosis not present

## 2021-08-03 DIAGNOSIS — Z01419 Encounter for gynecological examination (general) (routine) without abnormal findings: Secondary | ICD-10-CM

## 2021-08-03 DIAGNOSIS — L309 Dermatitis, unspecified: Secondary | ICD-10-CM

## 2021-08-03 DIAGNOSIS — B356 Tinea cruris: Secondary | ICD-10-CM

## 2021-08-03 DIAGNOSIS — R300 Dysuria: Secondary | ICD-10-CM

## 2021-08-03 LAB — POCT URINALYSIS DIPSTICK
Bilirubin, UA: NEGATIVE
Blood, UA: NEGATIVE
Glucose, UA: NEGATIVE
Ketones, UA: NEGATIVE
Leukocytes, UA: NEGATIVE
Nitrite, UA: NEGATIVE
Protein, UA: NEGATIVE
Spec Grav, UA: 1.025 (ref 1.010–1.025)
pH, UA: 6 (ref 5.0–8.0)

## 2021-08-03 MED ORDER — FLUCONAZOLE 150 MG PO TABS: 150.0000 mg | ORAL_TABLET | Freq: Once | ORAL | 0 refills | Status: AC

## 2021-08-03 MED ORDER — CLOTRIMAZOLE-BETAMETHASONE 1-0.05 % EX CREA: TOPICAL_CREAM | CUTANEOUS | 1 refills | Status: DC

## 2021-08-03 NOTE — Patient Instructions (Addendum)
I value your feedback and you entrusting us with your care. If you get a [facility] patient survey, I would appreciate you taking the time to let us know about your experience today. Thank you!   [facility]: [phone]  

## 2021-08-03 NOTE — Progress Notes (Signed)
PCP: Albina Billet, MD   Chief Complaint  Patient presents with   Gynecologic Exam   Urinary Tract Infection    Burning urinating x couple of days    HPI:      [name] is a 61 y.o. [id number] who LMP was No LMP recorded. Patient has had an ablation., presents today for her annual examination.  Her menses are absent due to ablation and she is postmenopausal. No PMB. She does have tolerable vasomotor sx.   Sex activity: not sexually active. She does not have vaginal dryness. Does get vaginal irritation/fissures due to moisture. Treats with lotrisone crm ext with relief.  Was in pool this past wk and has noticed burning with urination for a couple days, no other UTI sx. No increased vag d/c, odor.   Last Pap: 07/14/20 Results were: no abnormalities /neg HPV DNA.  Hx of STDs: HPV on pap  Last mamm[facility]: 09/29/20 at [facility].  Results were: normal--routine follow-up in 12 months There is no FH of breast cancer. There is no FH of ovarian cancer. The patient does do self-breast exams.  Colonoscopy: colonoscopy 2023 with [facility];  Repeat due after 7 yrs. Pt had done at [facility].   Tobacco use: The patient denies current or previous tobacco use. Alcohol use: none No drug use Exercise: not active  She does not get adequate calcium or Vitamin D in her diet.  Labs with PCP.  She cont to get fungal infections under panus/bilat ing area. She tries to keep area dry but sx are intermittent. She uses gold bond powder to help with moisture control and treats prn with lotrisone crm.   Past Medical History:  Diagnosis Date   Abnormal Pap smear of cervix 1988   Acid reflux 01/17/2013   Allergic rhinitis    Anemia    Anginal pain (HCC)    Anxiety    Arthritis    Asthma    Celiac disease    Cervical dysplasia 1988   CHF (congestive heart failure) (HCC)    Complication of anesthesia    bp drops after anesthesia and has to be kept overnight   Coronary artery disease    Headache     migraine with visual aura   Heart trouble    History of cardiac arrest    History of hiatal hernia    History of kidney stones    History of mammogram 03/25/2013; 12/07/14   BIRADS 1; NEG   History of Papanicolaou smear of cervix 03/20/12; 12/28/15   -/-; -/-   Hypercholesteremia    Hypertension    IBS (irritable bowel syndrome)    Menopausal symptoms    MI (myocardial infarction) ([place])    Mitral valve prolapse    Pneumonia    PONV (postoperative nausea and vomiting)    nausea only   Sleep apnea    Vulvovaginitis    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    Past Surgical History:  Procedure Laterality Date   ABLATION  2013   [facility]   BACK SURGERY     BACK SURGERY  2016; 2017   L4, L5   CARDIAC SURGERY  03/19/12; 10/2014   HEART CATH AND STENT   CESAREAN SECTION     1986; [place]  08/2010   16 POLYPS (ALL BENIGN) DX C CELIAC DISEASE   COLONOSCOPY N/A 02/17/2021   Procedure: COLONOSCOPY;  Surgeon: [name], MD;  Location: [facility] ENDOSCOPY;  Service: Endoscopy;  Laterality: N/A;   COMBINED HYSTEROSCOPY DIAGNOSTIC / D&C  2013   [facility]   CYSTOSCOPY W/ RETROGRADES Left 01/20/2019   Procedure: CYSTOSCOPY WITH RETROGRADE PYELOGRAM;  Surgeon: Abbie Sons, MD;  Location: [facility] ORS;  Service: Urology;  Laterality: Left;   CYSTOSCOPY W/ URETERAL STENT PLACEMENT Left 12/13/2018   Procedure: CYSTOSCOPY WITH RETROGRADE PYELOGRAM/URETERAL STENT PLACEMENT;  Surgeon: Irine Seal, MD;  Location: [facility] ORS;  Service: Urology;  Laterality: Left;   CYSTOSCOPY/URETEROSCOPY/HOLMIUM LASER/STENT PLACEMENT Left 01/20/2019   Procedure: CYSTOSCOPY/URETEROSCOPY/HOLMIUM LASER/STENT EXCHANGE;  Surgeon: Abbie Sons, MD;  Location: [facility] ORS;  Service: Urology;  Laterality: Left;   DILATION AND CURETTAGE OF UTERUS  2001   ESOPHAGOGASTRODUODENOSCOPY  08/2010   DX C CELIAC DISEASE   ESOPHAGOGASTRODUODENOSCOPY N/A 02/17/2021   Procedure:  ESOPHAGOGASTRODUODENOSCOPY (EGD);  Surgeon: [name], MD;  Location: [facility] ENDOSCOPY;  Service: Endoscopy;  Laterality: N/A;   HEART STENTS  05/27/2011   HIP SURGERY  2016   RIGHT IT BAND AND BURSA REMOVAL   OOPHORECTOMY Right 1982   TOTAL KNEE ARTHROPLASTY Left 01/19/2020   Procedure: TOTAL KNEE ARTHROPLASTY;  Surgeon: Renette Butters, MD;  Location: [facility] ORS;  Service: Orthopedics;  Laterality: Left;    Family History  Problem Relation Age of Onset   CAD Father    Transient ischemic attack Father    Diabetes Father    Cerebrovascular Accident Father    Heart disease Father        M-MVP; F BETA;BLOCKERS   Hypothyroidism Father    Hyperthyroidism Mother    Cancer Maternal Uncle        KIDNEY   Uterine cancer Maternal Aunt 69    Social History   Socioeconomic History   Marital status: Divorced    Spouse name: Not on file   Number of children: 2   Years of education: 16   Highest education level: Not on file  Occupational History   Occupation: TEACHER  Tobacco Use   Smoking status: Never   Smokeless tobacco: Never  Vaping Use   Vaping Use: Never used  Substance and Sexual Activity   Alcohol use: Yes    Alcohol/week: 1.0 standard drink of alcohol    Types: 1 Standard drinks or equivalent per week   Drug use: No   Sexual activity: Not Currently    Birth control/protection: Surgical  Other Topics Concern   Not on file  Social History Narrative   Not on file   Social Determinants of Health   Financial Resource Strain: Not on file  Food Insecurity: Not on file  Transportation Needs: Not on file  Physical Activity: Not on file  Stress: Not on file  Social Connections: Not on file  Intimate Partner Violence: Not on file    Current Meds  Medication Sig   albuterol (PROVENTIL) (2.5 MG/3ML) 0.083% nebulizer solution Take 3 mLs (2.5 mg total) by nebulization every 6 (six) hours as needed for wheezing or shortness of breath.   albuterol (VENTOLIN HFA) 108  (90 Base) MCG/ACT inhaler Inhale 1-2 puffs into the lungs every 4 (four) hours as needed for wheezing or shortness of breath.   budesonide (PULMICORT) 0.5 MG/2ML nebulizer solution Take 2 mLs (0.5 mg total) by nebulization in the morning and at bedtime.   chlorpheniramine-HYDROcodone 10-8 MG/5ML SMARTSIG:5 Milliliter(s) By Mouth Every 12 Hours   [employer] ASPIRIN ADULT LOW DOSE 81 MG chewable tablet Chew 81 mg by  mouth daily.   diphenhydrAMINE (BENADRYL) 50 MG tablet Take 0.5-1 tablets (25-50 mg total) by mouth every 6 (six) hours as needed for itching (Take with oxycodone).   ergocalciferol (VITAMIN D2) 1.25 MG (50000 UT) capsule Take 1 capsule by mouth once a week.   famotidine (PEPCID) 40 MG tablet Take 40 mg by mouth at bedtime.   fluconazole (DIFLUCAN) 150 MG tablet Take 1 tablet (150 mg total) by mouth once for 1 dose. May repeat in 3 days if still having symptoms   LORazepam (ATIVAN) 1 MG tablet Take 1 mg by mouth 2 (two) times daily.   losartan (COZAAR) 25 MG tablet Take 1 tablet by mouth daily.   montelukast (SINGULAIR) 10 MG tablet SMARTSIG:1 Tablet(s) By Mouth Every Evening   NITROSTAT 0.4 MG SL tablet Place 0.4 mg under the tongue every 5 (five) minutes as needed for chest pain.    pantoprazole (PROTONIX) 20 MG tablet Take 20 mg by mouth 2 (two) times daily.   rosuvastatin (CRESTOR) 10 MG tablet Take 10 mg by mouth at bedtime.   [DISCONTINUED] clotrimazole-betamethasone (LOTRISONE) cream APPLY TO AFFECTED AREA TWICE A DAY AS NEEDED FOR SYMPTOMS FOR UP TO 2 WEEKS      ROS:  Review of Systems  Constitutional:  Negative for fatigue, fever and unexpected weight change.  Respiratory:  Positive for cough. Negative for shortness of breath and wheezing.   Cardiovascular:  Negative for chest pain, palpitations and leg swelling.  Gastrointestinal:  Negative for blood in stool, constipation, diarrhea, nausea and vomiting.  Endocrine: Negative for cold intolerance, heat intolerance and polyuria.   Genitourinary:  Positive for dysuria. Negative for dyspareunia, flank pain, frequency, genital sores, hematuria, menstrual problem, pelvic pain, urgency, vaginal bleeding, vaginal discharge and vaginal pain.  Musculoskeletal:  Negative for back pain, joint swelling and myalgias.  Skin:  Positive for rash.  Neurological:  Negative for dizziness, syncope, light-headedness, numbness and headaches.  Hematological:  Negative for adenopathy. Does not bruise/bleed easily.  Psychiatric/Behavioral:  Negative for agitation, confusion, sleep disturbance and suicidal ideas. The patient is not nervous/anxious.      Objective: BP 120/80   Ht 5' 5"  (1.651 m)   Wt 224 lb (101.6 kg)   BMI 37.28 kg/m    Physical Exam Constitutional:      Appearance: She is well-developed.  Genitourinary:     Vulva normal.     Right Labia: rash.     Right Labia: No tenderness or lesions.    Left Labia: rash.     Left Labia: No tenderness or lesions.       No vaginal discharge, erythema or tenderness.      Right Adnexa: not tender and no mass present.    Left Adnexa: not tender and no mass present.    No cervical friability or polyp.     Uterus is not enlarged or tender.  Breasts:    Right: No mass, nipple discharge, skin change or tenderness.     Left: No mass, nipple discharge, skin change or tenderness.  Neck:     Thyroid: No thyromegaly.  Cardiovascular:     Rate and Rhythm: Normal rate and regular rhythm.     Heart sounds: Normal heart sounds. No murmur heard. Pulmonary:     Effort: Pulmonary effort is normal.     Breath sounds: Normal breath sounds.  Abdominal:     Palpations: Abdomen is soft.     Tenderness: There is no abdominal tenderness. There is no guarding or  rebound.  Musculoskeletal:        General: Normal range of motion.     Cervical back: Normal range of motion.  Lymphadenopathy:     Cervical: No cervical adenopathy.  Neurological:     General: No focal deficit present.      Mental Status: She is alert and oriented to person, place, and time.     Cranial Nerves: No cranial nerve deficit.  Skin:    General: Skin is warm and dry.  Psychiatric:        Mood and Affect: Mood normal.        Behavior: Behavior normal.        Thought Content: Thought content normal.        Judgment: Judgment normal.  Vitals reviewed.    Results for orders placed or performed in visit on 08/03/21 (from the past 24 hour(s))  POCT Urinalysis Dipstick     Status: Normal   Collection Time: 08/03/21  2:10 PM  Result Value Ref Range   Color, UA yellow    Clarity, UA clear    Glucose, UA Negative Negative   Bilirubin, UA neg    Ketones, UA neg    Spec Grav, UA 1.025 1.010 - 1.025   Blood, UA neg    pH, UA 6.0 5.0 - 8.0   Protein, UA Negative Negative   Urobilinogen, UA     Nitrite, UA neg    Leukocytes, UA Negative Negative   Appearance     Odor      Assessment/Plan: Encounter for annual routine gynecological examination  Encounter for screening mammogram for malignant neoplasm of breast - Plan: MM 3D SCREEN BREAST BILATERAL; pt to schedule mammo  Dermatitis - Plan: clotrimazole-betamethasone (LOTRISONE) cream; Rx RF. Keep area dry, antifungal powder for breasts/panus.   Dysuria - Plan: POCT Urinalysis Dipstick; neg UA, pos ext vag sx most likely the cause. Treat to tinea, f/u prn sx.   Tinea cruris - Plan: fluconazole (DIFLUCAN) 150 MG tablet; Rx diflucan and Rx RF lotrisone crm. F/u prn.    Meds ordered this encounter  Medications   fluconazole (DIFLUCAN) 150 MG tablet    Sig: Take 1 tablet (150 mg total) by mouth once for 1 dose. May repeat in 3 days if still having symptoms    Dispense:  2 tablet    Refill:  0    Order Specific Question:   Supervising Provider    Answer:   Rubie Maid [[id number]]   clotrimazole-betamethasone (LOTRISONE) cream    Sig: APPLY TO AFFECTED AREA TWICE A DAY AS NEEDED FOR SYMPTOMS FOR UP TO 2 WEEKS    Dispense:  45 g    Refill:  1     Order Specific Question:   Supervising Provider    Answer:   Renaldo Reel          GYN counsel breast self exam, mammography screening, menopause, adequate intake of calcium and vitamin D, diet and exercise    F/U  Return in about 1 year (around 08/04/2022).  Bijou Easler B. Virgil Lightner, PA-C 08/03/2021 2:12 PM

## 2021-08-04 ENCOUNTER — Other Ambulatory Visit (HOSPITAL_COMMUNITY): Payer: Self-pay | Admitting: Orthopedic Surgery

## 2021-08-04 ENCOUNTER — Other Ambulatory Visit: Payer: Self-pay | Admitting: Orthopedic Surgery

## 2021-08-04 DIAGNOSIS — T84033A Mechanical loosening of internal left knee prosthetic joint, initial encounter: Secondary | ICD-10-CM

## 2021-08-16 ENCOUNTER — Encounter (HOSPITAL_COMMUNITY): Payer: BC Managed Care – PPO

## 2021-08-16 ENCOUNTER — Ambulatory Visit (HOSPITAL_COMMUNITY): Payer: BC Managed Care – PPO

## 2021-10-02 ENCOUNTER — Encounter (HOSPITAL_COMMUNITY)
Admission: RE | Admit: 2021-10-02 | Discharge: 2021-10-02 | Disposition: A | Discharge status: Home / Self Care | Payer: BC Managed Care – PPO | Source: Ambulatory Visit | Attending: Orthopedic Surgery | Admitting: Orthopedic Surgery

## 2021-10-02 DIAGNOSIS — T84033A Mechanical loosening of internal left knee prosthetic joint, initial encounter: Secondary | ICD-10-CM | POA: Diagnosis not present

## 2021-10-02 MED ORDER — TECHNETIUM TC 99M MEDRONATE IV KIT
20.0000 | PACK | Freq: Once | INTRAVENOUS | Status: AC | PRN
  Administered 2021-10-02: 21.4 via INTRAVENOUS

## 2021-10-03 ENCOUNTER — Encounter: Payer: Self-pay | Admitting: *Deleted

## 2021-10-04 ENCOUNTER — Ambulatory Visit (INDEPENDENT_AMBULATORY_CARE_PROVIDER_SITE_OTHER): Payer: BC Managed Care – PPO

## 2021-10-04 ENCOUNTER — Encounter: Payer: Self-pay | Admitting: Physician Assistant

## 2021-10-04 ENCOUNTER — Ambulatory Visit: Payer: BC Managed Care – PPO | Attending: Physician Assistant | Admitting: Physician Assistant

## 2021-10-04 VITALS — BP 116/82 | HR 69 | Ht 65.0 in | Wt 222.6 lb

## 2021-10-04 DIAGNOSIS — I25118 Atherosclerotic heart disease of native coronary artery with other forms of angina pectoris: Secondary | ICD-10-CM

## 2021-10-04 DIAGNOSIS — R002 Palpitations: Secondary | ICD-10-CM

## 2021-10-04 DIAGNOSIS — I10 Essential (primary) hypertension: Secondary | ICD-10-CM

## 2021-10-04 DIAGNOSIS — E785 Hyperlipidemia, unspecified: Secondary | ICD-10-CM

## 2021-10-04 DIAGNOSIS — H5461 Unqualified visual loss, right eye, normal vision left eye: Secondary | ICD-10-CM | POA: Diagnosis not present

## 2021-10-04 DIAGNOSIS — R072 Precordial pain: Secondary | ICD-10-CM

## 2021-10-04 NOTE — Progress Notes (Signed)
Cardiology Office Note    Date:  10/04/2021   ID:  KENYON ESHLEMAN, DOB 10/05/1960, MRN [id number]  PCP:  Albina Billet, MD  Cardiologist:  Ida Rogue, MD  Electrophysiologist:  None   Chief Complaint: ED follow-up  History of Present Illness:   Brenda Craig is a 61 y.o. female with history of CAD with STEMI complicated by ventricular fibrillation arrest in 05/2011 status post PCI to the LCx with unstable angina in 03/2012 status post PCI to the LAD, recurrent chest pain, celiac disease, iron deficiency anemia, HTN, HLD, nephrolithiasis, and OSA who presents for ED follow-up  LHC and 10/2014 showed patent stents with 50% mid LAD stenosis with negative FFR.  Most recent ischemic evaluation via regadenoson stress test in 11/2020 was without evidence of inducible myocardial ischemia with normal LVSF.  She noted 5 out of 10 chest pain throughout the study.  She established care with Dr. Rockey Situ in 01/2021, at that time typical anginal symptoms were notable for back pain.  She was without symptoms of decompensation.  She was seen at [facility] Emergency Department on 09/30/2021 with right lower back pain radiating to the left lower back and right upper back that worsened while driving her car that day associated with nausea and shortness of breath.  Symptoms were consistent with her prior angina.  High-sensitivity troponin normal x3 (0-hour, 2-hour, 6-hour).  EKG reported as sinus rhythm without significant change when compared to prior.  Chest x-ray was with mild bibasilar atelectasis without acute abnormalities.  CT of the abdomen/pelvis showed no acute findings with incidental notation of left L4 spondylolysis, degenerative changes resulting in varying degrees of neural foreman of stenosis, nonobstructing bilateral nephrolithiasis, moderate hiatal hernia, coronary artery calcification, and diverticulosis without evidence of diverticulitis.  Outpatient cardiology follow-up was recommended.  She  reports that she was driving to [place] to help move her mother locally last week.  Leading up to this she had developed some back pain similar to what she experienced leading up to her MI.  While driving, she began to feel short of breath, flushed, and with palpitations.  Given the symptoms, she was evaluated at the ED as outlined above.  In total, symptoms at that time lasted for several hours.  Since her ED evaluation, she has had intermittent continued symptoms, though not as long-lasting.  Functional status is somewhat limited secondary to issues stemming around her knee arthroplasty, though she does remain active.  Throughout her above MI and subsequent PCI's she has never had frank chest pain with current symptoms concerning for anginal equivalent.  She also reports a long history of intermittent vision loss in the right eye that dates back prior to her MI in 2013.  Following PCI, this did improve for a brief time span, though returned.  She has been evaluated by ophthalmology with noted decline in her vision in both eyes.  She indicated ophthalmology deferred further management at that time.  CTA of the neck in 2018 showed no hemodynamically significant stenosis or acute vascular process.   Labs independently reviewed: 09/2021 -Hgb 14.1, PLT 233, potassium 4.1, BUN 13, serum creatinine 0.81, albumin 4.4, AST/ALT normal  Past Medical History:  Diagnosis Date   Abnormal Pap smear of cervix 1988   Acid reflux 01/17/2013   Allergic rhinitis    Anemia    Anginal pain (HCC)    Anxiety    Arthritis    Asthma    Celiac disease    Cervical dysplasia 1988  CHF (congestive heart failure) (HCC)    Complication of anesthesia    bp drops after anesthesia and has to be kept overnight   Coronary artery disease    Headache    migraine with visual aura   Heart trouble    History of cardiac arrest    History of hiatal hernia    History of kidney stones    History of mammogram 03/25/2013; 12/07/14    BIRADS 1; NEG   History of Papanicolaou smear of cervix 03/20/12; 12/28/15   -/-; -/-   Hypercholesteremia    Hypertension    IBS (irritable bowel syndrome)    Menopausal symptoms    MI (myocardial infarction) ([place])    Mitral valve prolapse    Pneumonia    PONV (postoperative nausea and vomiting)    nausea only   Sleep apnea    Vulvovaginitis    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    Past Surgical History:  Procedure Laterality Date   ABLATION  2013   [facility]   BACK SURGERY     BACK SURGERY  2016; 2017   L4, L5   CARDIAC SURGERY  03/19/12; 10/2014   HEART CATH AND STENT   CESAREAN SECTION     1986; [place]  08/2010   16 POLYPS (ALL BENIGN) DX C CELIAC DISEASE   COLONOSCOPY N/A 02/17/2021   Procedure: COLONOSCOPY;  Surgeon: [name], MD;  Location: [facility] ENDOSCOPY;  Service: Endoscopy;  Laterality: N/A;   COMBINED HYSTEROSCOPY DIAGNOSTIC / D&C  2013   [facility]   CYSTOSCOPY W/ RETROGRADES Left 01/20/2019   Procedure: CYSTOSCOPY WITH RETROGRADE PYELOGRAM;  Surgeon: Abbie Sons, MD;  Location: [facility] ORS;  Service: Urology;  Laterality: Left;   CYSTOSCOPY W/ URETERAL STENT PLACEMENT Left 12/13/2018   Procedure: CYSTOSCOPY WITH RETROGRADE PYELOGRAM/URETERAL STENT PLACEMENT;  Surgeon: Irine Seal, MD;  Location: [facility] ORS;  Service: Urology;  Laterality: Left;   CYSTOSCOPY/URETEROSCOPY/HOLMIUM LASER/STENT PLACEMENT Left 01/20/2019   Procedure: CYSTOSCOPY/URETEROSCOPY/HOLMIUM LASER/STENT EXCHANGE;  Surgeon: Abbie Sons, MD;  Location: [facility] ORS;  Service: Urology;  Laterality: Left;   DILATION AND CURETTAGE OF UTERUS  2001   ESOPHAGOGASTRODUODENOSCOPY  08/2010   DX C CELIAC DISEASE   ESOPHAGOGASTRODUODENOSCOPY N/A 02/17/2021   Procedure: ESOPHAGOGASTRODUODENOSCOPY (EGD);  Surgeon: [name], MD;  Location: [facility] ENDOSCOPY;  Service: Endoscopy;  Laterality: N/A;   HEART STENTS  05/27/2011   HIP SURGERY   2016   RIGHT IT BAND AND BURSA REMOVAL   OOPHORECTOMY Right 1982   TOTAL KNEE ARTHROPLASTY Left 01/19/2020   Procedure: TOTAL KNEE ARTHROPLASTY;  Surgeon: Renette Butters, MD;  Location: [facility] ORS;  Service: Orthopedics;  Laterality: Left;    Current Medications: Current Meds  Medication Sig   budesonide (PULMICORT) 0.5 MG/2ML nebulizer solution Take 2 mLs (0.5 mg total) by nebulization in the morning and at bedtime. (Patient taking differently: Take 0.5 mg by nebulization as needed.)   chlorpheniramine-HYDROcodone 10-8 MG/5ML SMARTSIG:5 Milliliter(s) By Mouth Every 12 Hours   clotrimazole-betamethasone (LOTRISONE) cream APPLY TO AFFECTED AREA TWICE A DAY AS NEEDED FOR SYMPTOMS FOR UP TO 2 WEEKS   [employer] ASPIRIN ADULT LOW DOSE 81 MG chewable tablet Chew 81 mg by mouth daily.   diphenhydrAMINE (BENADRYL) 50 MG tablet Take 0.5-1 tablets (25-50 mg total) by mouth every 6 (six) hours as needed for itching (Take with oxycodone).   ergocalciferol (VITAMIN D2) 1.25 MG (50000  UT) capsule Take 1 capsule by mouth once a week.   famotidine (PEPCID) 40 MG tablet Take 40 mg by mouth at bedtime.   LORazepam (ATIVAN) 1 MG tablet Take 1 mg by mouth 2 (two) times daily.   losartan (COZAAR) 25 MG tablet Take 1 tablet by mouth daily.   montelukast (SINGULAIR) 10 MG tablet SMARTSIG:1 Tablet(s) By Mouth Every Evening   NITROSTAT 0.4 MG SL tablet Place 0.4 mg under the tongue every 5 (five) minutes as needed for chest pain.    pantoprazole (PROTONIX) 20 MG tablet Take 20 mg by mouth 2 (two) times daily.   rosuvastatin (CRESTOR) 10 MG tablet Take 10 mg by mouth at bedtime.    Allergies:   Other, Codeine, Esomeprazole magnesium, Iodinated contrast media, Omeprazole magnesium, Oxycodone-acetaminophen, Percocet [oxycodone-acetaminophen], and Tramadol   Social History   Socioeconomic History   Marital status: Divorced    Spouse name: Not on file   Number of children: 2   Years of education: 16   Highest education  level: Not on file  Occupational History   Occupation: TEACHER  Tobacco Use   Smoking status: Never   Smokeless tobacco: Never  Vaping Use   Vaping Use: Never used  Substance and Sexual Activity   Alcohol use: Yes    Alcohol/week: 1.0 standard drink of alcohol    Types: 1 Standard drinks or equivalent per week   Drug use: No   Sexual activity: Not Currently    Birth control/protection: Surgical  Other Topics Concern   Not on file  Social History Narrative   Not on file   Social Determinants of Health   Financial Resource Strain: Not on file  Food Insecurity: Not on file  Transportation Needs: Not on file  Physical Activity: Not on file  Stress: Not on file  Social Connections: Not on file     Family History:  The patient's family history includes CAD in her father; Cancer in her maternal uncle; Cerebrovascular Accident in her father; Diabetes in her father; Heart disease in her father; Hyperthyroidism in her mother; Hypothyroidism in her father; Transient ischemic attack in her father; Uterine cancer (age of onset: 82) in her maternal aunt.  ROS:   12-point review of systems is negative unless otherwise noted in the HPI.   EKGs/Labs/Other Studies Reviewed:    Studies reviewed were summarized above. The additional studies were reviewed today:  Lexiscan MPI 11/28/2020 ([place]): CONCLUSION:  1. Normal Lexiscan Myoview stress test without significant inducible myocardial ischemia  2. Normal left ventricular systolic function.  __________  Carlton Adam MPI 05/26/2019 [facility]): Conclusion: In summary stress test was nondiagnostic negative for ischemia.   Post stress gated tomographic myocardial perfusion imaging:  Comparison of rest and stress images showed: There is normal homogeneous uptake of technetium labeled cardiac to the left ventricular myocardium at rest as well as stress.  No fixed or reversible perfusion defect to suggest ischemia or infarction.  Gated  wall motion analysis showed hyperdynamic and normal global and segmental left ventricular contractility and wall motion.  Calculated ejection fraction was 85%.    __________  2D echo 05/22/2016: - Left ventricle: The cavity size was normal. Wall thickness was at    the upper limits of normal. Systolic function was vigorous. The    estimated ejection fraction was in the range of 65% to 70%.    Doppler parameters are consistent with abnormal left ventricular    relaxation (grade 1 diastolic dysfunction).  - Right ventricle: The  cavity size was normal. Systolic function    was normal.  - Atrial septum: Agitated saline contrast study showed no    right-to-left atrial level shunt, in the baseline state. __________  Carlton Adam MPI 12/26/2015 [facility]): Conclusion:  Normal poststress ejection fraction of  75 %.  Normal rest/stress perfusion scan with no evidence of ischemia or infarction. __________  2D echo 08/23/2015: - Left ventricle: The cavity size was normal. Systolic function was    normal. The estimated ejection fraction was in the range of 60%    to 65%. Wall motion was normal; there were no regional wall    motion abnormalities. Left ventricular diastolic function    parameters were normal.  - Left atrium: The atrium was normal in size.  - Right ventricle: Systolic function was normal.  - Pulmonary arteries: Systolic pressure was within the normal    range.   Impressions:   - Normal study. __________  LHC 10/18/2014 [facility]): Left main normal, LAD patent stent with 30% stenosis distal followed by 50% stenosis at the level of D2 (both stenoses were 10% increase from prior), distal LAD 20% stenosis, LCx normal, RCA dominant large vessel with mid and distal 30% stenosis  Final results :  1.  Clinical : Recurrent chest pains etiology undetermined.  2.  Anatomic : Normal LAD and marginal circumflex stents.  Mild  progression of atherosclerosis about 10% at all focal previous  stenosis.  Mid LAD has 50% stenosis.  Negative FFR for hemodynamic significance.    Other stenosis including 30% mid and distal right coronary artery and 30%  followed by 50% mid LAD stenosis.  Ejection fraction improved 55-60%.   Plan:  Continue aspirin and Brilinta.  Increase statin to better control of LDL.  Risk factor control including diet exercise for weight loss.  __________  Carlton Adam MPI 10/09/2014: There was no ST segment deviation noted during stress. Blood pressure demonstrated a blunted response to exercise. No clear evidence of stress-induced myocardial ischemia Overall ejection fraction of 45% with inferolateral wall hypokinesis This is considered a low riskHistory This is a low risk study. Nuclear stress EF: 45%. The left ventricular ejection fraction is mildly decreased (45-54%). __________  Stress echo 01/17/2013 ([facility]): INTERPRETATION    NORMAL STRESS TEST. NORMAL RESTING STUDY WITH NO WALL MOTION ABNORMALITIES   AT REST AND PEAK STRESS.   VALVULAR REGURGITATION: TRIVIAL PR, TRIVIAL TR   NO VALVULAR STENOSIS   NORMAL RESTING BP - APPROPRIATE RESPONSE  __________  [facility] 04/15/2012 [facility]): Left main normal, mid LAD 95% stenosis status post PCI/DES, LCx small to moderate size vessel and nondominant, RCA dominant with 20% ostial and proximal stenosis, LVEF 60% __________  See Epic for remaining remote cardiac imaging results  EKG:  EKG is ordered today.  The EKG ordered today demonstrates NSR, 69 bpm, possible prior inferior infarct, no acute ST-T changes, no significant change when compared to prior tracing  Recent Labs: 06/09/2021: Hemoglobin 13.3; Platelets 226  Recent Lipid Panel    Component Value Date/Time   CHOL 106 08/22/2015 0940   TRIG 107 08/22/2015 0940   HDL 42 08/22/2015 0940   CHOLHDL 2.5 08/22/2015 0940   VLDL 21 08/22/2015 0940   LDLCALC 43 08/22/2015 0940    PHYSICAL EXAM:    VS:  BP 116/82 (BP Location: Right Arm, Patient Position:  Sitting, Cuff Size: Normal)   Pulse 69   Ht 5' 5"  (1.651 m)   Wt 222 lb 9.6 oz (101 kg)   SpO2 92%  BMI 37.04 kg/m   BMI: Body mass index is 37.04 kg/m.  Physical Exam Vitals reviewed.  Constitutional:      Appearance: She is well-developed.  HENT:     Head: Normocephalic and atraumatic.  Eyes:     General:        Right eye: No discharge.        Left eye: No discharge.  Neck:     Vascular: No JVD.  Cardiovascular:     Rate and Rhythm: Normal rate and regular rhythm.     Pulses:          Posterior tibial pulses are 2+ on the right side and 2+ on the left side.     Heart sounds: Normal heart sounds, S1 normal and S2 normal. Heart sounds not distant. No midsystolic click and no opening snap. No murmur heard.    No friction rub.  Pulmonary:     Effort: Pulmonary effort is normal. No respiratory distress.     Breath sounds: Normal breath sounds. No decreased breath sounds, wheezing or rales.  Chest:     Chest wall: No tenderness.  Abdominal:     General: There is no distension.  Musculoskeletal:     Cervical back: Normal range of motion.     Right lower leg: No edema.     Left lower leg: No edema.  Skin:    General: Skin is warm and dry.     Nails: There is no clubbing.  Neurological:     Mental Status: She is alert and oriented to person, place, and time.  Psychiatric:        Speech: Speech normal.        Behavior: Behavior normal.        Thought Content: Thought content normal.        Judgment: Judgment normal.     Wt Readings from Last 3 Encounters:  10/04/21 222 lb 9.6 oz (101 kg)  08/03/21 224 lb (101.6 kg)  06/09/21 222 lb (100.7 kg)     ASSESSMENT & PLAN:   CAD involving the native coronary arteries with other forms of angina: Currently symptom-free.  Schedule Lexiscan MPI to evaluate for high risk ischemia.  If low risk, would escalate antianginal therapy with low threshold for LHC for refractory symptoms.  Continue aggressive risk factor modification  and current medical therapy including aspirin, losartan, rosuvastatin, and as needed SL NTG.  Palpitations/flushing: Zio patch.  Intermittent right vision loss: Schedule carotid artery ultrasound.  Place Zio patch.  Consider echo based on stress test results.  Otherwise, follow-up with ophthalmology.  May need neurology evaluation if not already completed.  HTN: Blood pressure is well controlled in the office today.  She remains on losartan 25 mg.  HLD: No recent lipid panel available for review.  We will contact her PCPs office for updated labs.  She remains on rosuvastatin 10 mg.   Shared Decision Making/Informed Consent{  The risks [chest pain, shortness of breath, cardiac arrhythmias, dizziness, blood pressure fluctuations, myocardial infarction, stroke/transient ischemic attack, nausea, vomiting, allergic reaction, radiation exposure, metallic taste sensation and life-threatening complications (estimated to be 1 in 10,000)], benefits (risk stratification, diagnosing coronary artery disease, treatment guidance) and alternatives of a nuclear stress test were discussed in detail with Ms. Lupu and she agrees to proceed.     Disposition: F/u with Dr. Rockey Situ or an APP in 2 months.   Medication Adjustments/Labs and Tests Ordered: Current medicines are reviewed at length with the patient today.  Concerns regarding medicines are outlined above. Medication changes, Labs and Tests ordered today are summarized above and listed in the Patient Instructions accessible in Encounters.   Signed, Christell Faith, PA-C 10/04/2021 8:48 AM     [address], [place] [address] [phone]

## 2021-10-04 NOTE — Patient Instructions (Addendum)
Medication Instructions:  No changes at this time.   *If you need a refill on your cardiac medications before your next appointment, please call your pharmacy*   Lab Work: None  If you have labs (blood work) drawn today and your tests are completely normal, you will receive your results only by: [place] (if you have MyChart) OR A paper copy in the mail If you have any lab test that is abnormal or we need to change your treatment, we will call you to review the results.   Testing/Procedures: Your physician has requested that you have a carotid duplex. This test is an ultrasound of the carotid arteries in your neck. It looks at blood flow through these arteries that supply the brain with blood. Allow one hour for this exam. There are no restrictions or special instructions.  [place]  Your caregiver has ordered a Stress Test with nuclear imaging. The purpose of this test is to evaluate the blood supply to your heart muscle. This procedure is referred to as a "Non-Invasive Stress Test." This is because other than having an IV started in your vein, nothing is inserted or "invades" your body. Cardiac stress tests are done to find areas of poor blood flow to the heart by determining the extent of coronary artery disease (CAD). Some patients exercise on a treadmill, which naturally increases the blood flow to your heart, while others who are  unable to walk on a treadmill due to physical limitations have a pharmacologic/chemical stress agent called Lexiscan . This medicine will mimic walking on a treadmill by temporarily increasing your coronary blood flow.   Please note: these test may take anywhere between 2-4 hours to complete  PLEASE REPORT TO [place] AT THE FIRST DESK WILL DIRECT YOU WHERE TO GO  Date of Procedure:__________________  Arrival Time for Procedure:________________________  Instructions regarding medication:   __XX__ : Take all  of your medications with small sip of water.   PLEASE NOTIFY THE OFFICE AT LEAST 58 HOURS IN ADVANCE IF YOU ARE UNABLE TO KEEP YOUR APPOINTMENT.  [phone] AND  PLEASE NOTIFY NUCLEAR MEDICINE AT [facility] AT LEAST 24 HOURS IN ADVANCE IF YOU ARE UNABLE TO KEEP YOUR APPOINTMENT. [phone]  How to prepare for your Myoview test:  Do not eat or drink after midnight No caffeine for 24 hours prior to test No smoking 24 hours prior to test. Your medication may be taken with water.  If your doctor stopped a medication because of this test, do not take that medication. Ladies, please do not wear dresses.  Skirts or pants are appropriate. Please wear a short sleeve shirt. No perfume, cologne or lotion. Wear comfortable walking shoes. No heels!  Your physician has recommended that you wear a Zio monitor.   This monitor is a medical device that records the heart's electrical activity. Doctors most often use these monitors to diagnose arrhythmias. Arrhythmias are problems with the speed or rhythm of the heartbeat. The monitor is a small device applied to your chest. You can wear one while you do your normal daily activities. While wearing this monitor if you have any symptoms to push the button and record what you felt. Once you have worn this monitor for the period of time provider prescribed (Usually 14 days), you will return the monitor device in the postage paid box. Once it is returned they will download the data collected and provide [place] with a report which the provider will  then review and we will call you with those results. Important tips:  Avoid showering during the first 24 hours of wearing the monitor. Avoid excessive sweating to help maximize wear time. Do not submerge the device, no hot tubs, and no swimming pools. Keep any lotions or oils away from the patch. After 24 hours you may shower with the patch on. Take brief showers with your back facing the shower head.  Do not remove patch once  it has been placed because that will interrupt data and decrease adhesive wear time. Push the button when you have any symptoms and write down what you were feeling. Once you have completed wearing your monitor, remove and place into box which has postage paid and place in your outgoing mailbox.  If for some reason you have misplaced your box then call our office and we can provide another box and/or mail it off for you.     Follow-Up: At [facility], you and your health needs are our priority.  As part of our continuing mission to provide you with exceptional heart care, we have created designated Provider Care Teams.  These Care Teams include your primary Cardiologist (physician) and Advanced Practice Providers (APPs -  Physician Assistants and Nurse Practitioners) who all work together to provide you with the care you need, when you need it.   Your next appointment:   2 month(s)  The format for your next appointment:   In Person  Provider:   Ida Rogue, MD or Christell Faith, PA-C        Important Information About [place]

## 2021-10-11 ENCOUNTER — Encounter
Admission: RE | Admit: 2021-10-11 | Discharge: 2021-10-11 | Disposition: A | Discharge status: Home / Self Care | Payer: BC Managed Care – PPO | Source: Ambulatory Visit | Attending: Physician Assistant | Admitting: Physician Assistant

## 2021-10-11 DIAGNOSIS — R072 Precordial pain: Secondary | ICD-10-CM | POA: Insufficient documentation

## 2021-10-11 LAB — NM MYOCAR MULTI W/SPECT W/WALL MOTION / EF
LV dias vol: 32 mL (ref 46–106)
LV sys vol: 4 mL
Nuc Stress EF: 88 %
Peak HR: 130 {beats}/min
Rest HR: 90 {beats}/min
Rest Nuclear Isotope Dose: 10.8 mCi
SDS: 0
SRS: 3
SSS: 1
ST Depression (mm): 0 mm
Stress Nuclear Isotope Dose: 30.5 mCi
TID: 1.06

## 2021-10-11 MED ORDER — REGADENOSON 0.4 MG/5ML IV SOLN
0.4000 mg | Freq: Once | INTRAVENOUS | Status: AC
  Administered 2021-10-11: 0.4 mg via INTRAVENOUS

## 2021-10-11 MED ORDER — TECHNETIUM TC 99M TETROFOSMIN IV KIT: 10.7900 | PACK | Freq: Once | INTRAVENOUS | Status: DC | PRN

## 2021-10-11 MED ORDER — TECHNETIUM TC 99M TETROFOSMIN IV KIT
30.4800 | PACK | Freq: Once | INTRAVENOUS | Status: AC | PRN
  Administered 2021-10-11: 30.48 via INTRAVENOUS

## 2021-10-22 DIAGNOSIS — R002 Palpitations: Secondary | ICD-10-CM

## 2021-10-24 ENCOUNTER — Encounter: Payer: Self-pay | Admitting: Obstetrics and Gynecology

## 2021-10-26 ENCOUNTER — Telehealth: Payer: Self-pay | Admitting: *Deleted

## 2021-10-26 NOTE — Telephone Encounter (Signed)
Preoperative team, patient has upcoming follow-up appointment with cardiology scheduled for 12/29/2021.  Patient also has upcoming stress testing and is being evaluated for palpitations.  I will defer preoperative cardiac evaluation to her follow-up appointment.  Please add preoperative cardiac evaluation to upcoming appointment note.  Thank you for your help.  Jossie Ng. Cu[place]oc Attar NP-C     10/26/2021, 10:43 AM [place] [address] [phone] Fax [phone]

## 2021-10-26 NOTE — Telephone Encounter (Signed)
   Pre-operative Risk Assessment    Patient Name: Brenda Craig  DOB: [birth year] MRN: [id number]      Request for Surgical Clearance    Procedure:   REVISION LEFT TOTAL KNEE ARTHROPLASTY  Date of Surgery:  Clearance TBD                                 Surgeon:  Charlies Constable, MD Surgeon's Group or Practice Name:  [facility] Phone number:  [phone] Fax number:  [phone]  ATTN:  KELLY HIGH   Type of Clearance Requested:   - Medical    Type of Anesthesia:  Spinal   Additional requests/questions:    SignedJeanann Lewandowsky   10/26/2021, 7:00 AM

## 2021-10-26 NOTE — Telephone Encounter (Signed)
Have added to appointment 12/29/21, surgical clearance.  Have routed to requesting surgeon's office to make them aware.

## 2021-10-31 ENCOUNTER — Telehealth: Payer: Self-pay | Admitting: Primary Care

## 2021-10-31 NOTE — Telephone Encounter (Signed)
Fax received from Dr. Charlies Constable with [facility] to perform a Revision Left Total Knee Arthroplasty on patient.  Patient needs surgery clearance. Surgery is Pending. Patient was seen on 02/24/2021. Office protocol is a risk assessment can be sent to surgeon if patient has been seen in 60 days or less.   Sending to Derl Barrow NP for risk assessment or recommendations if patient needs to be seen in office prior to surgical procedure.    Patient has office visit with [facility] NP on 01/03/2022 for surgical clearance

## 2021-11-09 ENCOUNTER — Ambulatory Visit: Payer: BC Managed Care – PPO | Attending: Physician Assistant

## 2021-11-09 DIAGNOSIS — H5461 Unqualified visual loss, right eye, normal vision left eye: Secondary | ICD-10-CM

## 2021-11-16 ENCOUNTER — Telehealth: Payer: Self-pay | Admitting: *Deleted

## 2021-11-16 NOTE — Telephone Encounter (Signed)
Reviewed results with patient and she verbalized understanding with no further questions at this time.

## 2021-11-16 NOTE — Telephone Encounter (Signed)
-----   Message from Rise Mu, PA-C sent at 11/10/2021  8:02 AM EDT ----- Carotid artery ultrasound was normal.

## 2021-11-16 NOTE — Telephone Encounter (Signed)
Left voicemail message to call back for review of results.  

## 2021-11-28 ENCOUNTER — Telehealth: Payer: Self-pay | Admitting: Nurse Practitioner

## 2021-11-28 MED ORDER — METOPROLOL TARTRATE 25 MG PO TABS: 12.5000 mg | ORAL_TABLET | Freq: Two times a day (BID) | ORAL | 3 refills | Status: DC

## 2021-11-28 NOTE — Telephone Encounter (Signed)
Patient is returning RN's call for heart monitor results. Please advise.

## 2021-11-28 NOTE — Telephone Encounter (Signed)
Pt made aware of monitor results along with NP's recommendations. Pt verbalized understanding and metoprolol 12.5 mg BID sent to the requested pharmacy.   Theora Gianotti, NP 11/27/2021  5:52 PM EST     Predominantly normal heart rhythm.  16 runs of fast heart beats stemming from the top chambers with rates up to 222 bpm, but all episodes were short lived (longest 14 beats).  No sustained arrhythmias.  Triggered events were associated with normal rhythms.  I recommend starting metoprolol 12.5 mg BID.  This should help with those fast heart rhythms.

## 2021-11-29 IMAGING — CT CT ANGIO CHEST
3 of 7 series · 18 of 46 positions shown · IV contrast (OMNIPAQUE)
Comparison: CT a chest May 21, 2016.

CLINICAL DATA: PE suspected high probability, status post total
knee revision with low blood pressure, positive D-dimer and
shortness of breath.

EXAM:
CT ANGIOGRAPHY CHEST WITH CONTRAST
TECHNIQUE: Multidetector CT imaging of the chest was performed using the
standard protocol during bolus administration of intravenous
contrast. Multiplanar CT image reconstructions and MIPs were
obtained to evaluate the vascular anatomy.
CONTRAST:  100mL OMNIPAQUE IOHEXOL 350 MG/ML SOLN

[Series 6: thins · axial · 0.76mm/px · z∈[-132,+106]mm · 15 of 271 slices shown]
[im 17/271  lung]
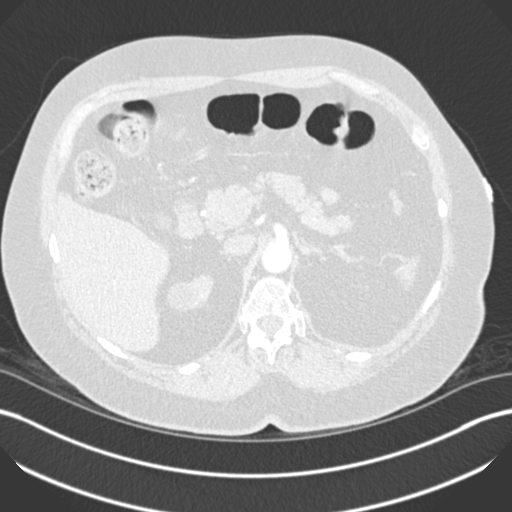
[im 34/271  soft-tissue]
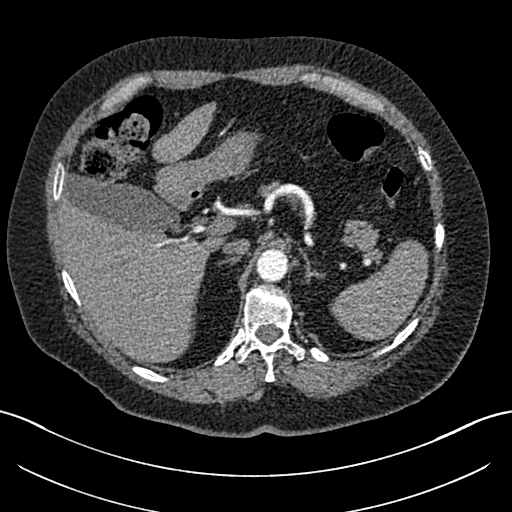
[im 51/271  lung]
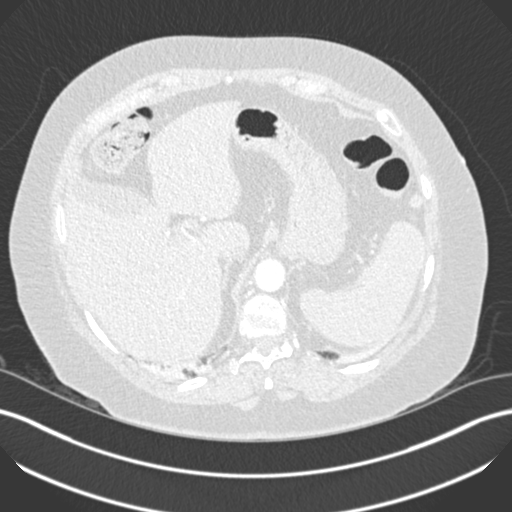
[im 68/271  soft-tissue]
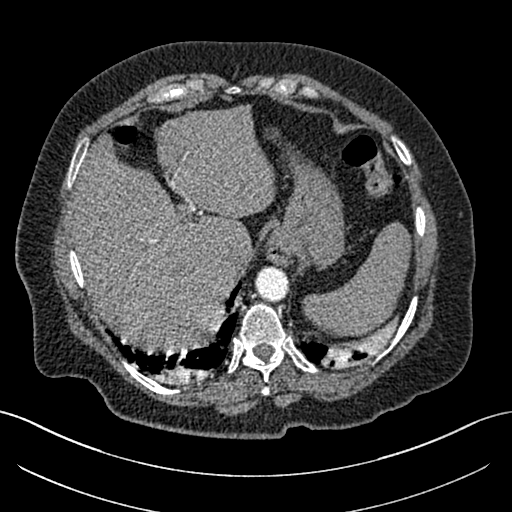
[im 85/271  lung]
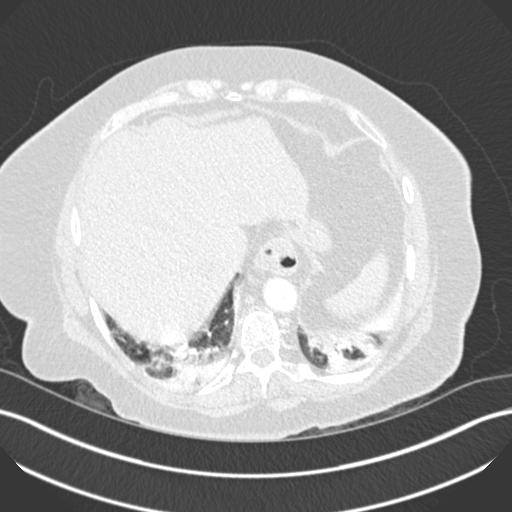
[im 102/271  soft-tissue]
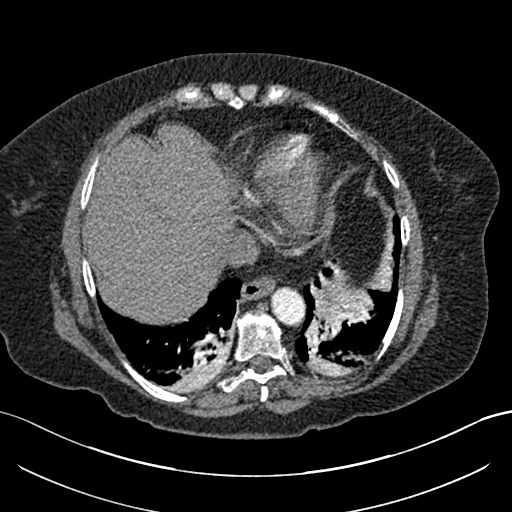
[im 119/271  lung]
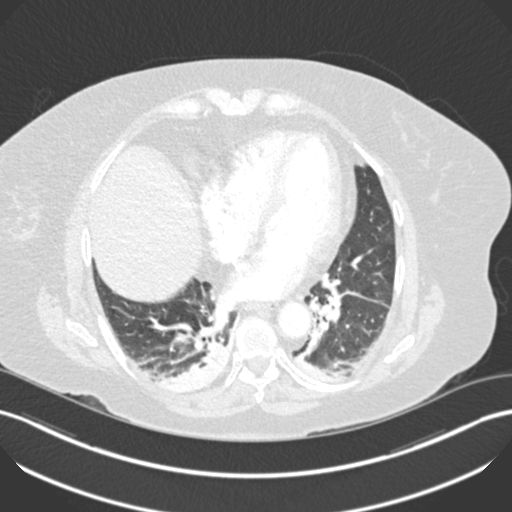
[im 136/271  soft-tissue]
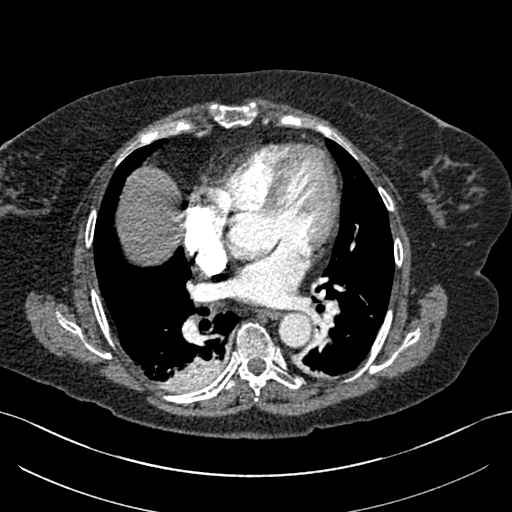
[im 152/271  lung]
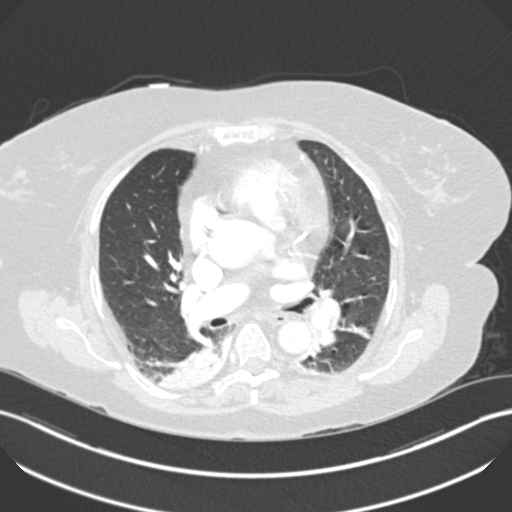
[im 169/271  soft-tissue]
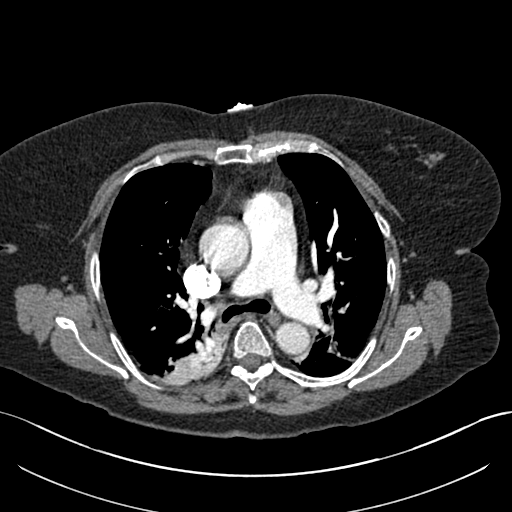
[im 186/271  lung]
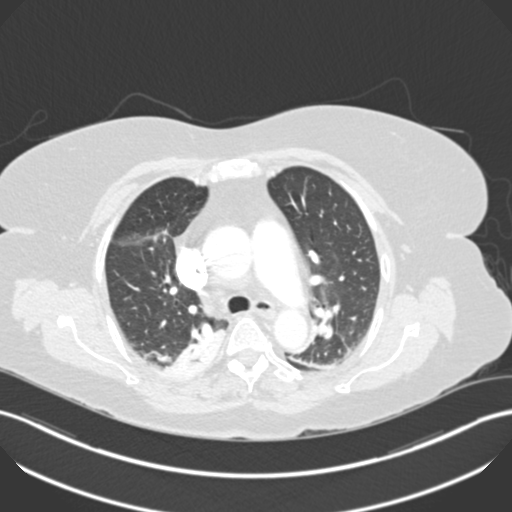
[im 203/271  soft-tissue]
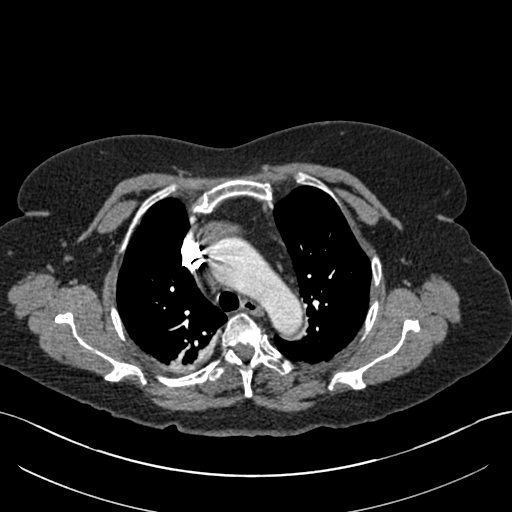
[im 220/271  lung]
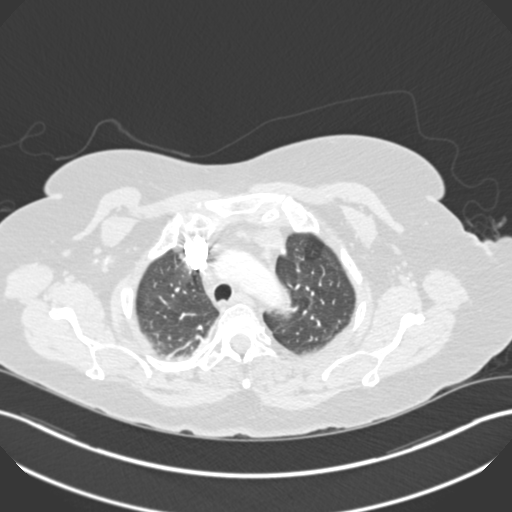
[im 237/271  soft-tissue]
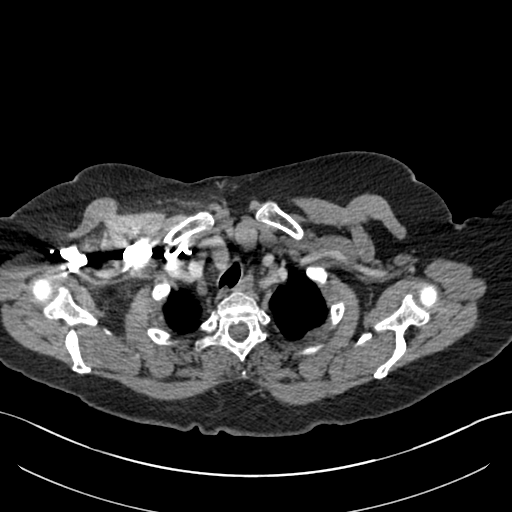
[im 254/271  lung]
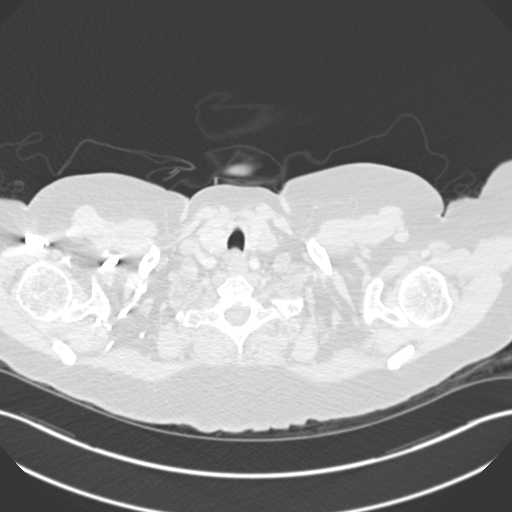

[Series 7: lung · axial · 0.76mm/px · 1 of 136 slices shown]
[im 17/136  soft-tissue]
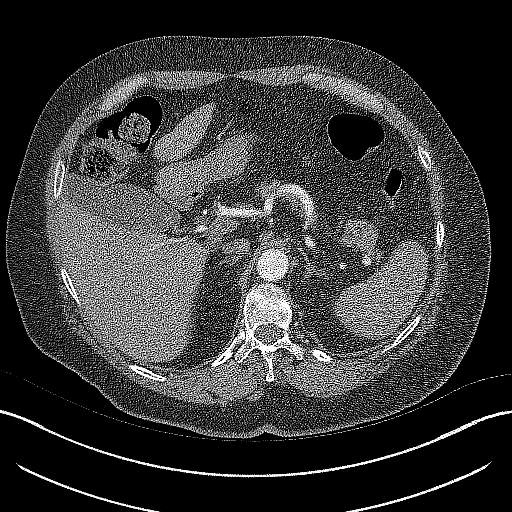

[Series 8: coronal mpr · coronal · 0.53mm/px · 2 of 89 slices shown]
[im 30/89  soft-tissue]
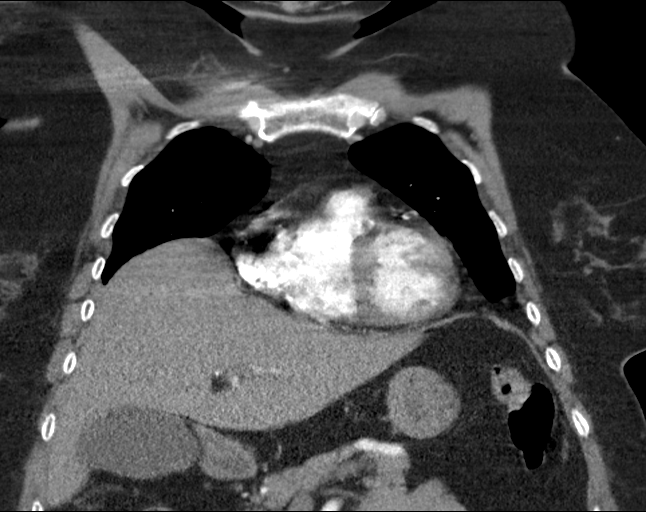
[im 59/89  soft-tissue]
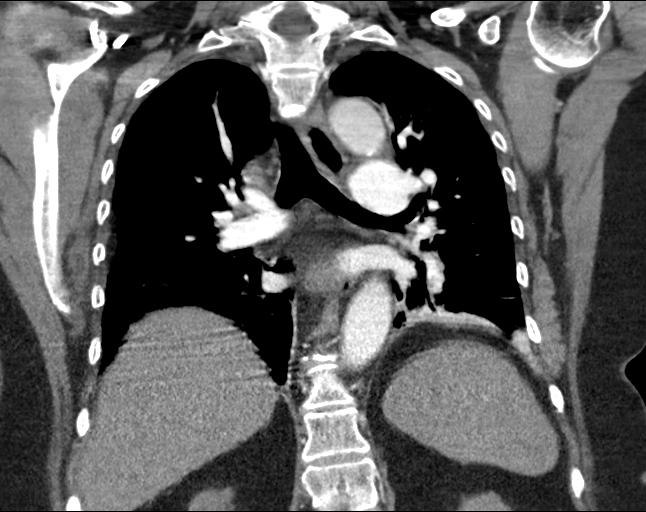

[18 of 46 positions shown; findings below may reference images not displayed]

FINDINGS: Cardiovascular: Satisfactory opacification of the pulmonary arteries
to the segmental level. No evidence of pulmonary embolism. Normal
heart size. No pericardial effusion.

Mediastinum/Nodes: No enlarged mediastinal, hilar, or axillary lymph
nodes. Thyroid gland and trachea demonstrate no significant
findings. Small hiatal hernia.

Lungs/Pleura: Consolidations and volume loss in the bilateral lower
lobes.

Upper Abdomen: Small accessory splenule. Right upper pole renal
cyst.

Musculoskeletal: No chest wall abnormality. No acute or significant
osseous findings.

Review of the MIP images confirms the above findings.
IMPRESSION: 1. Negative examination for pulmonary embolism.
2. Consolidations and volume loss in the bilateral lower lobes,
favor atelectasis. However, aspiration/infection possible.
3. Small hiatal hernia.

## 2021-12-08 ENCOUNTER — Ambulatory Visit: Payer: BC Managed Care – PPO | Admitting: Oncology

## 2021-12-08 ENCOUNTER — Other Ambulatory Visit: Payer: BC Managed Care – PPO

## 2021-12-10 NOTE — Progress Notes (Unsigned)
New Patient Note  RE: Brenda Craig MRN: [id number] DOB: [birth year] Date of Office Visit: 12/11/2021  Consult requested by: Albina Billet, MD Primary care provider: Albina Billet, MD  Chief Complaint: No chief complaint on file.  History of Present Illness: I had the pleasure of seeing Brenda Craig for initial evaluation at the [facility] on 12/10/2021. She is a 61 y.o. female, who is referred here by Albina Billet, MD for the evaluation of nickel allergy.  Patient is scheduled for *** replacement on ***. Patient had issues with ***.  No issues with watches, rings, bracelets, necklaces, belts, buttons on pants.   Prior joint replacements or metals in the body: ***.  Assessment and Plan: Brenda Craig is a 61 y.o. female with: No problem-specific Assessment & Plan notes found for this encounter.  No follow-ups on file.  No orders of the defined types were placed in this encounter.  Lab Orders  No laboratory test(s) ordered today    Other allergy screening: Asthma: {Blank single:19197::"yes","no"} Rhino conjunctivitis: {Blank single:19197::"yes","no"} Food allergy: {Blank single:19197::"yes","no"} Medication allergy: {Blank single:19197::"yes","no"} Hymenoptera allergy: {Blank single:19197::"yes","no"} Urticaria: {Blank single:19197::"yes","no"} Eczema:{Blank single:19197::"yes","no"} History of recurrent infections suggestive of immunodeficency: {Blank single:19197::"yes","no"}  Diagnostics: Spirometry:  Tracings reviewed. Her effort: {Blank single:19197::"Good reproducible efforts.","It was hard to get consistent efforts and there is a question as to whether this reflects a maximal maneuver.","Poor effort, data can not be interpreted."} FVC: ***L FEV1: ***L, ***% predicted FEV1/FVC ratio: ***% Interpretation: {Blank single:19197::"Spirometry consistent with mild obstructive disease","Spirometry consistent with moderate obstructive  disease","Spirometry consistent with severe obstructive disease","Spirometry consistent with possible restrictive disease","Spirometry consistent with mixed obstructive and restrictive disease","Spirometry uninterpretable due to technique","Spirometry consistent with normal pattern","No overt abnormalities noted given today's efforts"}.  Please see scanned spirometry results for details.  Skin Testing: {Blank single:19197::"Select foods","Environmental allergy panel","Environmental allergy panel and select foods","Food allergy panel","None","Deferred due to recent antihistamines use"}. *** Results discussed with patient/family.   Past Medical History: Patient Active Problem List   Diagnosis Date Noted  . Iron deficiency 06/09/2021  . Severe persistent asthma 03/01/2021  . OSA (obstructive sleep apnea) 05/10/2020  . Chronic respiratory failure with hypoxia ([place]) 05/10/2020  . Pneumonia 05/10/2020  . Acute respiratory failure with hypoxia ([place]) 01/21/2020  . S/P total knee arthroplasty, left 01/19/2020  . AKI (acute kidney injury) ([place])   . Obstruction of left ureter   . Severe sepsis ([place]) 12/13/2018  . Septic shock ([place]) 12/13/2018  . OA (osteoarthritis) of knee 05/21/2016  . Atypical chest pain 12/20/2015  . Diarrhea   . Chronic cough 03/28/2015  . Colon polyp 03/28/2015  . DU (duodenal ulcer) 03/28/2015  . Hemorrhoid 03/28/2015  . 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 Calculus of kidney 03/28/2015  . Kidney cysts 03/28/2015  . Displacement of lumbar intervertebral disc without myelopathy 02/14/2015  . Angina pectoris ([place])   . CAD in native artery   . Back pain   . S/P coronary artery stent placement   . Hyperlipidemia   . Encounter for preprocedural cardiovascular examination 05/30/2014  . Excess weight 08/11/2013  . Celiac disease 07/22/2013  . Transient blindness of right eye 05/25/2013  . Acid reflux 01/17/2013  . Airway hyperreactivity  01/17/2013  . Anxiety 01/17/2013  . Arteriosclerosis of coronary artery 01/17/2013  . HLD (hyperlipidemia) 01/17/2013  . BP (high blood pressure) 01/17/2013  . Atherosclerosis of coronary artery 10/31/2012  . Essential hypertension 10/31/2012  . Hypercholesterolemia 10/31/2012  .  Presence of stent in anterior descending branch of left coronary artery 10/31/2012  . Presence of stent in coronary artery 10/31/2012   Past Medical History:  Diagnosis Date  . Abnormal Pap smear of cervix 1988  . Acid reflux 01/17/2013  . Allergic rhinitis   . Anemia   . Anginal pain ([place])   . Anxiety   . Arthritis   . Asthma   . Celiac disease   . Cervical dysplasia 1988  . CHF (congestive heart failure) ([place])   . Complication of anesthesia    bp drops after anesthesia and has to be kept overnight  . Coronary artery disease   . Headache    migraine with visual aura  . Heart trouble   . History of cardiac arrest   . History of hiatal hernia   . History of kidney stones   . History of mammogram 03/25/2013; 12/07/14   BIRADS 1; NEG  . History of Papanicolaou smear of cervix 03/20/12; 12/28/15   -/-; -/-  . Hypercholesteremia   . Hypertension   . IBS (irritable bowel syndrome)   . Menopausal symptoms   . MI (myocardial infarction) ([place])   . Mitral valve prolapse   . Pneumonia   . PONV (postoperative nausea and vomiting)    nausea only  . Sleep apnea   . Vulvovaginitis    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   Past Surgical History: Past Surgical History:  Procedure Laterality Date  . ABLATION  2013   [facility]  . BACK SURGERY    . BACK SURGERY  2016; 2017   L4, L5  . CARDIAC SURGERY  03/19/12; 10/2014   HEART CATH AND STENT  . [place]; 1989  . COLD KNIFE CONE BIOPSY  1988  . COLONOSCOPY  08/2010   16 POLYPS (ALL BENIGN) DX C CELIAC DISEASE  . COLONOSCOPY N/A 02/17/2021   Procedure: COLONOSCOPY;  Surgeon: [name], MD;  Location: [facility]  ENDOSCOPY;  Service: Endoscopy;  Laterality: N/A;  . COMBINED HYSTEROSCOPY DIAGNOSTIC / D&C  2013   [facility]  . CYSTOSCOPY W/ RETROGRADES Left 01/20/2019   Procedure: CYSTOSCOPY WITH RETROGRADE PYELOGRAM;  Surgeon: Abbie Sons, MD;  Location: [facility] ORS;  Service: Urology;  Laterality: Left;  . CYSTOSCOPY W/ URETERAL STENT PLACEMENT Left 12/13/2018   Procedure: CYSTOSCOPY WITH RETROGRADE PYELOGRAM/URETERAL STENT PLACEMENT;  Surgeon: Irine Seal, MD;  Location: [facility] ORS;  Service: Urology;  Laterality: Left;  . CYSTOSCOPY/URETEROSCOPY/HOLMIUM LASER/STENT PLACEMENT Left 01/20/2019   Procedure: CYSTOSCOPY/URETEROSCOPY/HOLMIUM LASER/STENT EXCHANGE;  Surgeon: Abbie Sons, MD;  Location: [facility] ORS;  Service: Urology;  Laterality: Left;  . DILATION AND CURETTAGE OF UTERUS  2001  . ESOPHAGOGASTRODUODENOSCOPY  08/2010   DX C CELIAC DISEASE  . ESOPHAGOGASTRODUODENOSCOPY N/A 02/17/2021   Procedure: ESOPHAGOGASTRODUODENOSCOPY (EGD);  Surgeon: [name], MD;  Location: [facility] ENDOSCOPY;  Service: Endoscopy;  Laterality: N/A;  . HEART STENTS  05/27/2011  . HIP SURGERY  2016   RIGHT IT BAND AND BURSA REMOVAL  . OOPHORECTOMY Right 1982  . TOTAL KNEE ARTHROPLASTY Left 01/19/2020   Procedure: TOTAL KNEE ARTHROPLASTY;  Surgeon: Renette Butters, MD;  Location: [facility] ORS;  Service: Orthopedics;  Laterality: Left;   Medication List:  Current Outpatient Medications  Medication Sig Dispense Refill  . metoprolol tartrate (LOPRESSOR) 25 MG tablet Take 0.5 tablets (12.5 mg total) by mouth 2 (two) times daily. 90 tablet 3  . albuterol (PROVENTIL) (2.5 MG/3ML) 0.083% nebulizer  solution Take 3 mLs (2.5 mg total) by nebulization every 6 (six) hours as needed for wheezing or shortness of breath. (Patient not taking: Reported on 10/04/2021) 240 mL 2  . albuterol (VENTOLIN HFA) 108 (90 Base) MCG/ACT inhaler Inhale 1-2 puffs into the lungs every 4 (four) hours as needed for wheezing or shortness of breath. (Patient not  taking: Reported on 10/04/2021)    . budesonide (PULMICORT) 0.5 MG/2ML nebulizer solution Take 2 mLs (0.5 mg total) by nebulization in the morning and at bedtime. (Patient taking differently: Take 0.5 mg by nebulization as needed.) 120 mL 2  . chlorpheniramine-HYDROcodone 10-8 MG/5ML SMARTSIG:5 Milliliter(s) By Mouth Every 12 Hours    . clotrimazole-betamethasone (LOTRISONE) cream APPLY TO AFFECTED AREA TWICE A DAY AS NEEDED FOR SYMPTOMS FOR UP TO 2 WEEKS 45 g 1  . [facility] ASPIRIN ADULT LOW DOSE 81 MG chewable tablet Chew 81 mg by mouth daily.    . diphenhydrAMINE (BENADRYL) 50 MG tablet Take 0.5-1 tablets (25-50 mg total) by mouth every 6 (six) hours as needed for itching (Take with oxycodone). 60 tablet 0  . ergocalciferol (VITAMIN D2) 1.25 MG (50000 UT) capsule Take 1 capsule by mouth once a week.    . famotidine (PEPCID) 40 MG tablet Take 40 mg by mouth at bedtime.    Marland Kitchen LORazepam (ATIVAN) 1 MG tablet Take 1 mg by mouth 2 (two) times daily.    Marland Kitchen losartan (COZAAR) 25 MG tablet Take 1 tablet by mouth daily.    . montelukast (SINGULAIR) 10 MG tablet SMARTSIG:1 Tablet(s) By Mouth Every Evening    . NITROSTAT 0.4 MG SL tablet Place 0.4 mg under the tongue every 5 (five) minutes as needed for chest pain.     . pantoprazole (PROTONIX) 20 MG tablet Take 20 mg by mouth 2 (two) times daily.    . rosuvastatin (CRESTOR) 10 MG tablet Take 10 mg by mouth at bedtime.     No current facility-administered medications for this visit.   Allergies: Allergies  Allergen Reactions  . Other     Gluten allergy  . Codeine Nausea And Vomiting and Rash  . Esomeprazole Magnesium Palpitations and Other (See Comments)    IRREGULAR HEARTBEAT  . Iodinated Contrast Media Itching  . Omeprazole Magnesium Palpitations and Other (See Comments)    IRREGULAR HEARTBEAT  . Oxycodone-Acetaminophen Itching, Nausea And Vomiting and Rash    Can take with benadryl  . Percocet [Oxycodone-Acetaminophen] Itching, Nausea And Vomiting and  Rash    Can take with benadryl   . Tramadol Rash    Can take with benadryl    Social History: Social History   Socioeconomic History  . Marital status: Divorced    Spouse name: Not on file  . Number of children: 2  . Years of education: 26  . Highest education level: Not on file  Occupational History  . Occupation: TEACHER  Tobacco Use  . Smoking status: Never  . Smokeless tobacco: Never  Vaping Use  . Vaping Use: Never used  Substance and Sexual Activity  . Alcohol use: Yes    Alcohol/week: 1.0 standard drink of alcohol    Types: 1 Standard drinks or equivalent per week  . Drug use: No  . Sexual activity: Not Currently    Birth control/protection: Surgical  Other Topics Concern  . Not on file  Social History Narrative  . Not on file   Social Determinants of Health   Financial Resource Strain: Not on file  Food Insecurity: Not  on file  Transportation Needs: Not on file  Physical Activity: Not on file  Stress: Not on file  Social Connections: Not on file   Lives in a ***. Smoking: *** Occupation: ***  Environmental HistoryFreight forwarder in the house: Estate agent in the family room: {Blank single:19197::"yes","no"} Carpet in the bedroom: {Blank single:19197::"yes","no"} Heating: {Blank single:19197::"electric","gas","heat pump"} Cooling: {Blank single:19197::"central","window","heat pump"} Pet: {Blank single:19197::"yes ***","no"}  Family History: Family History  Problem Relation Age of Onset  . CAD Father   . Transient ischemic attack Father   . Diabetes Father   . Cerebrovascular Accident Father   . Heart disease Father        M-MVP; F BETA;BLOCKERS  . Hypothyroidism Father   . Hyperthyroidism Mother   . Cancer Maternal Uncle        KIDNEY  . Uterine cancer Maternal Aunt 69   Problem                               Relation Asthma                                   *** Eczema                                 *** Food allergy                          *** Allergic rhino conjunctivitis     ***  Review of Systems  Constitutional:  Negative for appetite change, chills, fever and unexpected weight change.  HENT:  Negative for congestion and rhinorrhea.   Eyes:  Negative for itching.  Respiratory:  Negative for cough, chest tightness, shortness of breath and wheezing.   Cardiovascular:  Negative for chest pain.  Gastrointestinal:  Negative for abdominal pain.  Genitourinary:  Negative for difficulty urinating.  Skin:  Negative for rash.  Neurological:  Negative for headaches.   Objective: There were no vitals taken for this visit. There is no height or weight on file to calculate BMI. Physical Exam Vitals and nursing note reviewed.  Constitutional:      Appearance: Normal appearance. She is well-developed.  HENT:     Head: Normocephalic and atraumatic.     Right Ear: Tympanic membrane and external ear normal.     Left Ear: Tympanic membrane and external ear normal.     Nose: Nose normal.     Mouth/Throat:     Mouth: Mucous membranes are moist.     Pharynx: Oropharynx is clear.  Eyes:     Conjunctiva/sclera: Conjunctivae normal.  Cardiovascular:     Rate and Rhythm: Normal rate and regular rhythm.     Heart sounds: Normal heart sounds. No murmur heard.    No friction rub. No gallop.  Pulmonary:     Effort: Pulmonary effort is normal.     Breath sounds: Normal breath sounds. No wheezing, rhonchi or rales.  Musculoskeletal:     Cervical back: Neck supple.  Skin:    General: Skin is warm.     Findings: No rash.  Neurological:     Mental Status: She is alert and oriented to person, place, and time.  Psychiatric:        Behavior: Behavior normal.  The plan  was reviewed with the patient/family, and all questions/concerned were addressed.  It was my pleasure to see Kieley today and participate in her care. Please feel free to contact me with any questions or  concerns.  Sincerely,  Rexene Alberts, DO Allergy & Immunology  [facility] office: [place] office: [phone]

## 2021-12-11 ENCOUNTER — Ambulatory Visit (INDEPENDENT_AMBULATORY_CARE_PROVIDER_SITE_OTHER): Payer: BC Managed Care – PPO | Admitting: Allergy

## 2021-12-11 ENCOUNTER — Inpatient Hospital Stay: Payer: BC Managed Care – PPO | Attending: Oncology

## 2021-12-11 ENCOUNTER — Encounter: Payer: Self-pay | Admitting: Oncology

## 2021-12-11 ENCOUNTER — Encounter: Payer: Self-pay | Admitting: Allergy

## 2021-12-11 ENCOUNTER — Inpatient Hospital Stay: Payer: BC Managed Care – PPO | Admitting: Oncology

## 2021-12-11 ENCOUNTER — Other Ambulatory Visit: Payer: Self-pay

## 2021-12-11 VITALS — BP 110/77 | HR 79 | Temp 98.1°F | Resp 18 | Wt 224.9 lb

## 2021-12-11 VITALS — BP 118/86 | HR 67 | Temp 98.3°F | Resp 18 | Ht 64.0 in | Wt 223.6 lb

## 2021-12-11 DIAGNOSIS — Z87442 Personal history of urinary calculi: Secondary | ICD-10-CM | POA: Diagnosis not present

## 2021-12-11 DIAGNOSIS — E611 Iron deficiency: Secondary | ICD-10-CM | POA: Diagnosis present

## 2021-12-11 DIAGNOSIS — I252 Old myocardial infarction: Secondary | ICD-10-CM | POA: Diagnosis not present

## 2021-12-11 DIAGNOSIS — K900 Celiac disease: Secondary | ICD-10-CM

## 2021-12-11 DIAGNOSIS — G4730 Sleep apnea, unspecified: Secondary | ICD-10-CM | POA: Diagnosis not present

## 2021-12-11 DIAGNOSIS — Z90721 Acquired absence of ovaries, unilateral: Secondary | ICD-10-CM | POA: Diagnosis not present

## 2021-12-11 DIAGNOSIS — Z79899 Other long term (current) drug therapy: Secondary | ICD-10-CM | POA: Diagnosis not present

## 2021-12-11 DIAGNOSIS — J3089 Other allergic rhinitis: Secondary | ICD-10-CM | POA: Insufficient documentation

## 2021-12-11 DIAGNOSIS — J45909 Unspecified asthma, uncomplicated: Secondary | ICD-10-CM | POA: Insufficient documentation

## 2021-12-11 DIAGNOSIS — R21 Rash and other nonspecific skin eruption: Secondary | ICD-10-CM | POA: Insufficient documentation

## 2021-12-11 DIAGNOSIS — Z91041 Radiographic dye allergy status: Secondary | ICD-10-CM | POA: Insufficient documentation

## 2021-12-11 DIAGNOSIS — Z8349 Family history of other endocrine, nutritional and metabolic diseases: Secondary | ICD-10-CM | POA: Diagnosis not present

## 2021-12-11 DIAGNOSIS — Z885 Allergy status to narcotic agent status: Secondary | ICD-10-CM | POA: Diagnosis not present

## 2021-12-11 DIAGNOSIS — K317 Polyp of stomach and duodenum: Secondary | ICD-10-CM | POA: Diagnosis not present

## 2021-12-11 DIAGNOSIS — R5383 Other fatigue: Secondary | ICD-10-CM | POA: Insufficient documentation

## 2021-12-11 DIAGNOSIS — L2389 Allergic contact dermatitis due to other agents: Secondary | ICD-10-CM

## 2021-12-11 DIAGNOSIS — J4540 Moderate persistent asthma, uncomplicated: Secondary | ICD-10-CM | POA: Diagnosis not present

## 2021-12-11 DIAGNOSIS — Z833 Family history of diabetes mellitus: Secondary | ICD-10-CM | POA: Diagnosis not present

## 2021-12-11 DIAGNOSIS — I10 Essential (primary) hypertension: Secondary | ICD-10-CM | POA: Diagnosis not present

## 2021-12-11 DIAGNOSIS — K219 Gastro-esophageal reflux disease without esophagitis: Secondary | ICD-10-CM | POA: Insufficient documentation

## 2021-12-11 DIAGNOSIS — K449 Diaphragmatic hernia without obstruction or gangrene: Secondary | ICD-10-CM | POA: Diagnosis not present

## 2021-12-11 DIAGNOSIS — I2510 Atherosclerotic heart disease of native coronary artery without angina pectoris: Secondary | ICD-10-CM | POA: Insufficient documentation

## 2021-12-11 DIAGNOSIS — Z8249 Family history of ischemic heart disease and other diseases of the circulatory system: Secondary | ICD-10-CM | POA: Diagnosis not present

## 2021-12-11 DIAGNOSIS — Z8051 Family history of malignant neoplasm of kidney: Secondary | ICD-10-CM | POA: Insufficient documentation

## 2021-12-11 DIAGNOSIS — R053 Chronic cough: Secondary | ICD-10-CM

## 2021-12-11 DIAGNOSIS — Z8049 Family history of malignant neoplasm of other genital organs: Secondary | ICD-10-CM | POA: Insufficient documentation

## 2021-12-11 DIAGNOSIS — Z823 Family history of stroke: Secondary | ICD-10-CM | POA: Diagnosis not present

## 2021-12-11 DIAGNOSIS — Z8674 Personal history of sudden cardiac arrest: Secondary | ICD-10-CM | POA: Diagnosis not present

## 2021-12-11 DIAGNOSIS — R031 Nonspecific low blood-pressure reading: Secondary | ICD-10-CM | POA: Insufficient documentation

## 2021-12-11 DIAGNOSIS — Z888 Allergy status to other drugs, medicaments and biological substances status: Secondary | ICD-10-CM | POA: Diagnosis not present

## 2021-12-11 DIAGNOSIS — G4733 Obstructive sleep apnea (adult) (pediatric): Secondary | ICD-10-CM

## 2021-12-11 LAB — CBC WITH DIFFERENTIAL/PLATELET
Abs Immature Granulocytes: 0.04 10*3/uL (ref 0.00–0.07)
Basophils Absolute: 0.1 10*3/uL (ref 0.0–0.1)
Basophils Relative: 1 %
Eosinophils Absolute: 0 10*3/uL (ref 0.0–0.5)
Eosinophils Relative: 0 %
HCT: 44.6 % (ref 36.0–46.0)
Hemoglobin: 13.9 g/dL (ref 12.0–15.0)
Immature Granulocytes: 0 %
Lymphocytes Relative: 25 %
Lymphs Abs: 2.3 10*3/uL (ref 0.7–4.0)
MCH: 28.9 pg (ref 26.0–34.0)
MCHC: 31.2 g/dL (ref 30.0–36.0)
MCV: 92.7 fL (ref 80.0–100.0)
Monocytes Absolute: 0.7 10*3/uL (ref 0.1–1.0)
Monocytes Relative: 8 %
Neutro Abs: 6.1 10*3/uL (ref 1.7–7.7)
Neutrophils Relative %: 66 %
Platelets: 258 10*3/uL (ref 150–400)
RBC: 4.81 MIL/uL (ref 3.87–5.11)
RDW: 14.2 % (ref 11.5–15.5)
WBC: 9.2 10*3/uL (ref 4.0–10.5)
nRBC: 0 % (ref 0.0–0.2)

## 2021-12-11 LAB — FERRITIN: Ferritin: 39 ng/mL (ref 11–307)

## 2021-12-11 LAB — IRON AND TIBC
Iron: 76 ug/dL (ref 28–170)
Saturation Ratios: 21 % (ref 10.4–31.8)
TIBC: 364 ug/dL (ref 250–450)
UIBC: 288 ug/dL

## 2021-12-11 LAB — RETIC PANEL
Immature Retic Fract: 12.5 % (ref 2.3–15.9)
RBC.: 4.77 MIL/uL (ref 3.87–5.11)
Retic Count, Absolute: 102.1 10*3/uL (ref 19.0–186.0)
Retic Ct Pct: 2.1 % (ref 0.4–3.1)
Reticulocyte Hemoglobin: 33.6 pg (ref >27.9)

## 2021-12-11 MED ORDER — ALBUTEROL SULFATE (2.5 MG/3ML) 0.083% IN NEBU: 2.5000 mg | INHALATION_SOLUTION | RESPIRATORY_TRACT | 1 refills | Status: DC | PRN

## 2021-12-11 MED ORDER — BUDESONIDE-FORMOTEROL FUMARATE 80-4.5 MCG/ACT IN AERO: 2.0000 | INHALATION_SPRAY | Freq: Two times a day (BID) | RESPIRATORY_TRACT | 5 refills | Status: DC

## 2021-12-11 NOTE — Patient Instructions (Addendum)
Patch testing Discussed with patient that patch testing tests for contact dermatitis and sometimes it does not correlate to how one will react to metals in the body. Positive patch testing results can help in avoiding those items however it is possible to get false negative results.  Nevertheless, this is the most accessible test for metal sensitivity currently available.  Patches placed today. Please avoid strenuous physical activities and do not get the patches on the back wet. No showering until final patch reading done. Okay to take antihistamines for itching but avoid placing any creams on the back where the patches are. We will remove the patches on Wednesday and will do our initial read. Then you will come back on Friday and next Monday.  Coughing The most common causes of chronic cough include the following: upper airway cough syndrome (UACS) which is caused by variety of rhinitis conditions; asthma; gastroesophageal reflux disease (GERD); chronic bronchitis from cigarette smoking or other inhaled environmental irritants; non-asthmatic eosinophilic bronchitis; and bronchiectasis.  In prospective studies, these conditions have accounted for up to 94% of the causes of chronic cough in immunocompetent adults.   Plan on environmental skin testing in the future.   Daily controller medication(s): Start Symbicort 42mg 2 puffs twice a day with spacer and rinse mouth afterwards. Spacer given and demonstrated proper use with inhaler. Patient understood technique and all questions/concerned were addressed.  May use albuterol rescue inhaler 2 puffs or nebulizer every 4 to 6 hours as needed for shortness of breath, chest tightness, coughing, and wheezing. May use albuterol rescue inhaler 2 puffs 5 to 15 minutes prior to strenuous physical activities. Monitor frequency of use.  Breathing control goals:  Full participation in all desired activities (may need albuterol before activity) Albuterol use two  times or less a week on average (not counting use with activity) Cough interfering with sleep two times or less a month Oral steroids no more than once a year No hospitalizations   Heartburn See handout for lifestyle and dietary modifications. Continue meds.  Blood pressure Monitor your blood pressure at home.   Follow up in 2 months or sooner if needed.

## 2021-12-11 NOTE — Progress Notes (Signed)
Hematology/Oncology Consult note [phone] [phone]         Patient Care Team: Albina Billet, MD as PCP - General (Internal Medicine) Minna Merritts, MD as PCP - Cardiology (Cardiology)  ASSESSMENT & PLAN:   Iron deficiency #Iron deficiency secondary to her celiac disease.  Not able to tolerate oral iron supplementation. Labs are reviewed and discussed with patient. Normal hemoglobin and iron panel.  No need for IV venofer.    Orders Placed This Encounter  Procedures   CBC with Differential/Platelet    Standing Status:   Future    Standing Expiration Date:   12/12/2022   Ferritin    Standing Status:   Future    Standing Expiration Date:   12/12/2022   Iron and TIBC    Standing Status:   Future    Standing Expiration Date:   12/12/2022   Retic Panel    Standing Status:   Future    Standing Expiration Date:   12/12/2022   Follow up in 6 months.  All questions were answered. The patient knows to call the clinic with any problems, questions or concerns.  Earlie Server, MD, PhD [facility] Hematology Oncology 12/11/2021   CHIEF COMPLAINTS/REASON FOR VISIT:  Follow up with iron deficiency  HISTORY OF PRESENTING ILLNESS:   Brenda Craig is a  61 y.o.  female with PMH listed below was seen in consultation at the request of  Albina Billet, MD  for evaluation of iron deficiency. Patient has a history of celiac disease, she also has a history of iron deficiency. recent EGD negative for active disease and tTG IgA negative suggests good compliance with GFD She has tried oral iron supplementation and did not tolerate due to GI side effects.  She was referred by gastroenterology for evaluation of iron deficiency and consideration of iron iron treatments. Patient reports history of IV iron infusion..  After iron infusion, she developed a mild infusion reactions with skin itchiness/rash.  Symptoms were improved after Benadryl.  She was able to finish iron  infusions with Benadryl as premed made an approximate.  Patient has history of coronary artery disease/STEMI, status post PCI x2. reports feeling tired Denies any black or bloody stool.  EGD: 02/17/2021 - 6 cm hiatal hernia, few fundic gland polyps in gastric body, normal examined duodenum with vili intact and no evidence of CD Colonoscopy: 02/17/2021 - one 5 mm TA removed from ascending colon  INTERVAL HISTORY Brenda Craig is a 61 y.o. female who has above history reviewed by me today presents for follow up visit for iron deficiency due to celiac disease.      Review of Systems  Constitutional:  Positive for fatigue. Negative for appetite change, chills and fever.  HENT:   Negative for hearing loss and voice change.   Eyes:  Negative for eye problems.  Respiratory:  Negative for chest tightness and cough.   Cardiovascular:  Negative for chest pain.  Gastrointestinal:  Negative for abdominal distention, abdominal pain and blood in stool.  Endocrine: Negative for hot flashes.  Genitourinary:  Negative for difficulty urinating and frequency.   Musculoskeletal:  Negative for arthralgias.  Skin:  Negative for itching and rash.  Neurological:  Negative for extremity weakness.  Hematological:  Negative for adenopathy.  Psychiatric/Behavioral:  Negative for confusion.     MEDICAL HISTORY:  Past Medical History:  Diagnosis Date   Abnormal Pap smear of cervix 1988   Acid reflux 01/17/2013   Allergic  rhinitis    Anemia    Anginal pain ([place])    Anxiety    Arthritis    Asthma    Celiac disease    Cervical dysplasia 1988   CHF (congestive heart failure) (HCC)    Complication of anesthesia    bp drops after anesthesia and has to be kept overnight   Coronary artery disease    Headache    migraine with visual aura   Heart trouble    History of cardiac arrest    History of hiatal hernia    History of kidney stones    History of mammogram 03/25/2013; 12/07/14   BIRADS 1; NEG    History of Papanicolaou smear of cervix 03/20/12; 12/28/15   -/-; -/-   Hypercholesteremia    Hypertension    IBS (irritable bowel syndrome)    Menopausal symptoms    MI (myocardial infarction) ([place])    Mitral valve prolapse    Pneumonia    PONV (postoperative nausea and vomiting)    nausea only   Sleep apnea    Vulvovaginitis    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    SURGICAL HISTORY: Past Surgical History:  Procedure Laterality Date   ABLATION  2013   [facility]   BACK SURGERY     BACK SURGERY  2016; 2017   L4, L5   CARDIAC SURGERY  03/19/12; 10/2014   HEART CATH AND STENT   CESAREAN SECTION     1986; [place]  08/2010   16 POLYPS (ALL BENIGN) DX C CELIAC DISEASE   COLONOSCOPY N/A 02/17/2021   Procedure: COLONOSCOPY;  Surgeon: [name], MD;  Location: [facility] ENDOSCOPY;  Service: Endoscopy;  Laterality: N/A;   COMBINED HYSTEROSCOPY DIAGNOSTIC / D&C  2013   [facility]   CYSTOSCOPY W/ RETROGRADES Left 01/20/2019   Procedure: CYSTOSCOPY WITH RETROGRADE PYELOGRAM;  Surgeon: Abbie Sons, MD;  Location: [facility] ORS;  Service: Urology;  Laterality: Left;   CYSTOSCOPY W/ URETERAL STENT PLACEMENT Left 12/13/2018   Procedure: CYSTOSCOPY WITH RETROGRADE PYELOGRAM/URETERAL STENT PLACEMENT;  Surgeon: Irine Seal, MD;  Location: [facility] ORS;  Service: Urology;  Laterality: Left;   CYSTOSCOPY/URETEROSCOPY/HOLMIUM LASER/STENT PLACEMENT Left 01/20/2019   Procedure: CYSTOSCOPY/URETEROSCOPY/HOLMIUM LASER/STENT EXCHANGE;  Surgeon: Abbie Sons, MD;  Location: [facility] ORS;  Service: Urology;  Laterality: Left;   DILATION AND CURETTAGE OF UTERUS  2001   ESOPHAGOGASTRODUODENOSCOPY  08/2010   DX C CELIAC DISEASE   ESOPHAGOGASTRODUODENOSCOPY N/A 02/17/2021   Procedure: ESOPHAGOGASTRODUODENOSCOPY (EGD);  Surgeon: [name], MD;  Location: [facility] ENDOSCOPY;  Service: Endoscopy;  Laterality: N/A;   HEART STENTS  05/27/2011   HIP SURGERY   2016   RIGHT IT BAND AND BURSA REMOVAL   OOPHORECTOMY Right 1982   TOTAL KNEE ARTHROPLASTY Left 01/19/2020   Procedure: TOTAL KNEE ARTHROPLASTY;  Surgeon: Renette Butters, MD;  Location: [facility] ORS;  Service: Orthopedics;  Laterality: Left;    SOCIAL HISTORY: Social History   Socioeconomic History   Marital status: Divorced    Spouse name: Not on file   Number of children: 2   Years of education: 16   Highest education level: Not on file  Occupational History   Occupation: TEACHER  Tobacco Use   Smoking status: Never    Passive exposure: Never   Smokeless tobacco: Never  Vaping Use   Vaping Use: Never used  Substance and Sexual Activity   Alcohol use:  Yes    Alcohol/week: 1.0 standard drink of alcohol    Types: 1 Standard drinks or equivalent per week   Drug use: No   Sexual activity: Not Currently    Birth control/protection: Surgical  Other Topics Concern   Not on file  Social History Narrative   Not on file   Social Determinants of Health   Financial Resource Strain: Not on file  Food Insecurity: Not on file  Transportation Needs: Not on file  Physical Activity: Not on file  Stress: Not on file  Social Connections: Not on file  Intimate Partner Violence: Not on file    FAMILY HISTORY: Family History  Problem Relation Age of Onset   CAD Father    Transient ischemic attack Father    Diabetes Father    Cerebrovascular Accident Father    Heart disease Father        M-MVP; F BETA;BLOCKERS   Hypothyroidism Father    Hyperthyroidism Mother    Cancer Maternal Uncle        KIDNEY   Uterine cancer Maternal Aunt 69    ALLERGIES:  is allergic to other, codeine, esomeprazole magnesium, iodinated contrast media, omeprazole magnesium, oxycodone-acetaminophen, percocet [oxycodone-acetaminophen], and tramadol.  MEDICATIONS:  Current Outpatient Medications  Medication Sig Dispense Refill   albuterol (PROVENTIL) (2.5 MG/3ML) 0.083% nebulizer solution Take 3 mLs (2.5  mg total) by nebulization every 4 (four) hours as needed for wheezing or shortness of breath (coughing fits). 75 mL 1   albuterol (VENTOLIN HFA) 108 (90 Base) MCG/ACT inhaler Inhale 1-2 puffs into the lungs every 4 (four) hours as needed for wheezing or shortness of breath.     budesonide (PULMICORT) 0.5 MG/2ML nebulizer solution Take 2 mLs (0.5 mg total) by nebulization in the morning and at bedtime. 120 mL 2   budesonide-formoterol (SYMBICORT) 80-4.5 MCG/ACT inhaler Inhale 2 puffs into the lungs in the morning and at bedtime. with spacer and rinse mouth afterwards. 1 each 5   chlorpheniramine-HYDROcodone 10-8 MG/5ML SMARTSIG:5 Milliliter(s) By Mouth Every 12 Hours     clotrimazole-betamethasone (LOTRISONE) cream APPLY TO AFFECTED AREA TWICE A DAY AS NEEDED FOR SYMPTOMS FOR UP TO 2 WEEKS 45 g 1   [employer] ASPIRIN ADULT LOW DOSE 81 MG chewable tablet Chew 81 mg by mouth daily.     diphenhydrAMINE (BENADRYL) 50 MG tablet Take 0.5-1 tablets (25-50 mg total) by mouth every 6 (six) hours as needed for itching (Take with oxycodone). 60 tablet 0   ergocalciferol (VITAMIN D2) 1.25 MG (50000 UT) capsule Take 1 capsule by mouth once a week.     famotidine (PEPCID) 40 MG tablet Take 40 mg by mouth at bedtime.     LORazepam (ATIVAN) 1 MG tablet Take 1 mg by mouth 2 (two) times daily.     losartan (COZAAR) 25 MG tablet Take 1 tablet by mouth daily.     metoprolol tartrate (LOPRESSOR) 25 MG tablet Take 0.5 tablets (12.5 mg total) by mouth 2 (two) times daily. 90 tablet 3   montelukast (SINGULAIR) 10 MG tablet SMARTSIG:1 Tablet(s) By Mouth Every Evening     pantoprazole (PROTONIX) 20 MG tablet Take 20 mg by mouth 2 (two) times daily.     rosuvastatin (CRESTOR) 10 MG tablet Take 10 mg by mouth at bedtime.     NITROSTAT 0.4 MG SL tablet Place 0.4 mg under the tongue every 5 (five) minutes as needed for chest pain.  (Patient not taking: Reported on 12/11/2021)     No current  facility-administered medications for this  visit.     PHYSICAL EXAMINATION:  Vitals:   12/11/21 1307  BP: 110/77  Pulse: 79  Resp: 18  Temp: 98.1 F (36.7 C)   Filed Weights   12/11/21 1307  Weight: 224 lb 14.4 oz (102 kg)    Physical Exam  LABORATORY DATA:  I have reviewed the data as listed Lab Results  Component Value Date   WBC 9.2 12/11/2021   HGB 13.9 12/11/2021   HCT 44.6 12/11/2021   MCV 92.7 12/11/2021   PLT 258 12/11/2021   No results for input(s): "NA", "K", "CL", "CO2", "GLUCOSE", "BUN", "CREATININE", "CALCIUM", "GFRNONAA", "GFRAA", "PROT", "ALBUMIN", "AST", "ALT", "ALKPHOS", "BILITOT", "BILIDIR", "IBILI" in the last 8760 hours. Iron/TIBC/Ferritin/ %Sat    Component Value Date/Time   IRON 76 12/11/2021 1247   TIBC 364 12/11/2021 1247   FERRITIN 39 12/11/2021 1247   IRONPCTSAT 21 12/11/2021 1247      RADIOGRAPHIC STUDIES: I have personally reviewed the radiological images as listed and agreed with the findings in the report. No results found.    ASSESSMENT & PLAN:  1. Iron deficiency    #Iron deficiency secondary to her celiac disease.  Not able to tolerate oral iron supplementation Check CBC, iron TIBC ferritin reticulocyte panel. We discussed about IV Venofer treatment option. Risks of infusion reactions including anaphylactic reactions were discussed with patient. Other side effects include but not limited to high blood pressure, headache,wheezing, SOB, skin rash and itchiness, weight gain, leg swelling, etc. Patient voices understanding and is willing to proceed if she needs to..  Today's blood work results are available after patient's encounter. Patient has a normal hemoglobin, normal iron saturation and ferritin.  Ferritin is at the low normal level. Given that she is not able to tolerate oral iron and has celiac disease which increase her risk of developing iron deficiency in the future, I think proceeding with a dose of IV Venofer for  maintenance is reasonable. We will schedule  patient to have IV Venofer 200 mg x 1. She will follow-up in 6 months.  Orders Placed This Encounter  Procedures   CBC with Differential/Platelet    Standing Status:   Future    Standing Expiration Date:   12/12/2022   Ferritin    Standing Status:   Future    Standing Expiration Date:   12/12/2022   Iron and TIBC    Standing Status:   Future    Standing Expiration Date:   12/12/2022   Retic Panel    Standing Status:   Future    Standing Expiration Date:   12/12/2022    All questions were answered. The patient knows to call the clinic with any problems questions or concerns.   Albina Billet, MD    Return of visit: 6 months.  Thank you for this kind referral and the opportunity to participate in the care of this patient. A copy of today's note is routed to referring provider   Earlie Server, MD, PhD [facility] Hematology Oncology 12/11/2021

## 2021-12-11 NOTE — Assessment & Plan Note (Addendum)
Coughing for 10+ years. Follows with pulmonology for OSA on CPAP with oxygen and takes albuterol prn. 2023 CXR showed some mild bibasilar atelectasis. Medical history significant for GERD, allergic rhinitis, htn, cardiac disease.  Today's spirometry showed: possible restrictive disease with 17% improvement in FEV1 post bronchodilator treatment. Clinically feeling slightly improved.  The most common causes of chronic cough include the following: upper airway cough syndrome (UACS) which is caused by variety of rhinitis conditions; asthma; gastroesophageal reflux disease (GERD); chronic bronchitis from cigarette smoking or other inhaled environmental irritants; non-asthmatic eosinophilic bronchitis; and bronchiectasis.  In prospective studies, these conditions have accounted for up to 94% of the causes of chronic cough in immunocompetent adults.  Given prolonged symptoms patient is willing to try daily controller medication.  Daily controller medication(s): Start Symbicort 78mg 2 puffs twice a day with spacer and rinse mouth afterwards. Spacer given and demonstrated proper use with inhaler. Patient understood technique and all questions/concerned were addressed.  May use albuterol rescue inhaler 2 puffs or nebulizer every 4 to 6 hours as needed for shortness of breath, chest tightness, coughing, and wheezing. May use albuterol rescue inhaler 2 puffs 5 to 15 minutes prior to strenuous physical activities. Monitor frequency of use.  Get spirometry at next visit. Continue to follow up with pulm for OSA.

## 2021-12-11 NOTE — Assessment & Plan Note (Signed)
.   See assessment and plan as above. 

## 2021-12-11 NOTE — Progress Notes (Signed)
Hematology/Oncology Consult note [phone] [phone]         Patient Care Team: Albina Billet, MD as PCP - General (Internal Medicine) Minna Merritts, MD as PCP - Cardiology (Cardiology)  ASSESSMENT & PLAN:   No problem-specific Assessment & Plan notes found for this encounter.   Orders Placed This Encounter  Procedures   CBC with Differential/Platelet    Standing Status:   Future    Standing Expiration Date:   12/12/2022   Ferritin    Standing Status:   Future    Standing Expiration Date:   12/12/2022   Iron and TIBC    Standing Status:   Future    Standing Expiration Date:   12/12/2022   Retic Panel    Standing Status:   Future    Standing Expiration Date:   12/12/2022    All questions were answered. The patient knows to call the clinic with any problems, questions or concerns.  Earlie Server, MD, PhD [facility] Hematology Oncology 12/11/2021   CHIEF COMPLAINTS/REASON FOR VISIT:  Evaluation of iron deficiency  HISTORY OF PRESENTING ILLNESS:   Brenda Craig is a  61 y.o.  female with PMH listed below was seen in consultation at the request of  Albina Billet, MD  for evaluation of iron deficiency. Patient has a history of celiac disease, she also has a history of iron deficiency. recent EGD negative for active disease and tTG IgA negative suggests good compliance with GFD She has tried oral iron supplementation and did not tolerate due to GI side effects.  She was referred by gastroenterology for evaluation of iron deficiency and consideration of iron iron treatments. Patient reports history of IV iron infusion..  After iron infusion, she developed a mild infusion reactions with skin itchiness/rash.  Symptoms were improved after Benadryl.  She was able to finish iron infusions with Benadryl as premed made an approximate.  Patient has history of coronary artery disease/STEMI, status post PCI x2. reports feeling tired Denies any black or bloody  stool.  EGD: 02/17/2021 - 6 cm hiatal hernia, few fundic gland polyps in gastric body, normal examined duodenum with vili intact and no evidence of CD Colonoscopy: 02/17/2021 - one 5 mm TA removed from ascending colon  Review of Systems  Constitutional:  Positive for fatigue. Negative for appetite change, chills and fever.  HENT:   Negative for hearing loss and voice change.   Eyes:  Negative for eye problems.  Respiratory:  Negative for chest tightness and cough.   Cardiovascular:  Negative for chest pain.  Gastrointestinal:  Negative for abdominal distention, abdominal pain and blood in stool.  Endocrine: Negative for hot flashes.  Genitourinary:  Negative for difficulty urinating and frequency.   Musculoskeletal:  Negative for arthralgias.  Skin:  Negative for itching and rash.  Neurological:  Negative for extremity weakness.  Hematological:  Negative for adenopathy.  Psychiatric/Behavioral:  Negative for confusion.     MEDICAL HISTORY:  Past Medical History:  Diagnosis Date   Abnormal Pap smear of cervix 1988   Acid reflux 01/17/2013   Allergic rhinitis    Anemia    Anginal pain (HCC)    Anxiety    Arthritis    Asthma    Celiac disease    Cervical dysplasia 1988   CHF (congestive heart failure) (HCC)    Complication of anesthesia    bp drops after anesthesia and has to be kept overnight   Coronary artery disease  Headache    migraine with visual aura   Heart trouble    History of cardiac arrest    History of hiatal hernia    History of kidney stones    History of mammogram 03/25/2013; 12/07/14   BIRADS 1; NEG   History of Papanicolaou smear of cervix 03/20/12; 12/28/15   -/-; -/-   Hypercholesteremia    Hypertension    IBS (irritable bowel syndrome)    Menopausal symptoms    MI (myocardial infarction) ([place])    Mitral valve prolapse    Pneumonia    PONV (postoperative nausea and vomiting)    nausea only   Sleep apnea    Vulvovaginitis    CHRONIC VULVAR ITCH  TX C TENNOVATE CREAM C SOME RELIEF    SURGICAL HISTORY: Past Surgical History:  Procedure Laterality Date   ABLATION  2013   [facility]   BACK SURGERY     BACK SURGERY  2016; 2017   L4, L5   CARDIAC SURGERY  03/19/12; 10/2014   HEART CATH AND STENT   CESAREAN SECTION     1986; [place]  08/2010   16 POLYPS (ALL BENIGN) DX C CELIAC DISEASE   COLONOSCOPY N/A 02/17/2021   Procedure: COLONOSCOPY;  Surgeon: [name], MD;  Location: [facility] ENDOSCOPY;  Service: Endoscopy;  Laterality: N/A;   COMBINED HYSTEROSCOPY DIAGNOSTIC / D&C  2013   [facility]   CYSTOSCOPY W/ RETROGRADES Left 01/20/2019   Procedure: CYSTOSCOPY WITH RETROGRADE PYELOGRAM;  Surgeon: Abbie Sons, MD;  Location: [facility] ORS;  Service: Urology;  Laterality: Left;   CYSTOSCOPY W/ URETERAL STENT PLACEMENT Left 12/13/2018   Procedure: CYSTOSCOPY WITH RETROGRADE PYELOGRAM/URETERAL STENT PLACEMENT;  Surgeon: Irine Seal, MD;  Location: [facility] ORS;  Service: Urology;  Laterality: Left;   CYSTOSCOPY/URETEROSCOPY/HOLMIUM LASER/STENT PLACEMENT Left 01/20/2019   Procedure: CYSTOSCOPY/URETEROSCOPY/HOLMIUM LASER/STENT EXCHANGE;  Surgeon: Abbie Sons, MD;  Location: [facility] ORS;  Service: Urology;  Laterality: Left;   DILATION AND CURETTAGE OF UTERUS  2001   ESOPHAGOGASTRODUODENOSCOPY  08/2010   DX C CELIAC DISEASE   ESOPHAGOGASTRODUODENOSCOPY N/A 02/17/2021   Procedure: ESOPHAGOGASTRODUODENOSCOPY (EGD);  Surgeon: [name], MD;  Location: [facility] END[facility]COPY;  Service: Endoscopy;  Laterality: N/A;   HEART STENTS  05/27/2011   HIP SURGERY  2016   RIGHT IT BAND AND BURSA REMOVAL   OOPHORECTOMY Right 1982   TOTAL KNEE ARTHROPLASTY Left 01/19/2020   Procedure: TOTAL KNEE ARTHROPLASTY;  Surgeon: Renette Butters, MD;  Location: [facility] ORS;  Service: Orthopedics;  Laterality: Left;    SOCIAL HISTORY: Social History   Socioeconomic History   Marital status: Divorced    Spouse name: Not on file    Number of children: 2   Years of education: 16   Highest education level: Not on file  Occupational History   Occupation: TEACHER  Tobacco Use   Smoking status: Never    Passive exposure: Never   Smokeless tobacco: Never  Vaping Use   Vaping Use: Never used  Substance and Sexual Activity   Alcohol use: Yes    Alcohol/week: 1.0 standard drink of alcohol    Types: 1 Standard drinks or equivalent per week   Drug use: No   Sexual activity: Not Currently    Birth control/protection: Surgical  Other Topics Concern   Not on file  Social History Narrative   Not on file   Social Determinants of Health   Financial Resource Strain: Not  on file  Food Insecurity: Not on file  Transportation Needs: Not on file  Physical Activity: Not on file  Stress: Not on file  Social Connections: Not on file  Intimate Partner Violence: Not on file    FAMILY HISTORY: Family History  Problem Relation Age of Onset   CAD Father    Transient ischemic attack Father    Diabetes Father    Cerebrovascular Accident Father    Heart disease Father        M-MVP; F BETA;BLOCKERS   Hypothyroidism Father    Hyperthyroidism Mother    Cancer Maternal Uncle        KIDNEY   Uterine cancer Maternal Aunt 69    ALLERGIES:  is allergic to other, codeine, esomeprazole magnesium, iodinated contrast media, omeprazole magnesium, oxycodone-acetaminophen, percocet [oxycodone-acetaminophen], and tramadol.  MEDICATIONS:  Current Outpatient Medications  Medication Sig Dispense Refill   albuterol (PROVENTIL) (2.5 MG/3ML) 0.083% nebulizer solution Take 3 mLs (2.5 mg total) by nebulization every 4 (four) hours as needed for wheezing or shortness of breath (coughing fits). 75 mL 1   albuterol (VENTOLIN HFA) 108 (90 Base) MCG/ACT inhaler Inhale 1-2 puffs into the lungs every 4 (four) hours as needed for wheezing or shortness of breath.     budesonide (PULMICORT) 0.5 MG/2ML nebulizer solution Take 2 mLs (0.5 mg total) by  nebulization in the morning and at bedtime. 120 mL 2   budesonide-formoterol (SYMBICORT) 80-4.5 MCG/ACT inhaler Inhale 2 puffs into the lungs in the morning and at bedtime. with spacer and rinse mouth afterwards. 1 each 5   chlorpheniramine-HYDROcodone 10-8 MG/5ML SMARTSIG:5 Milliliter(s) By Mouth Every 12 Hours     clotrimazole-betamethasone (LOTRISONE) cream APPLY TO AFFECTED AREA TWICE A DAY AS NEEDED FOR SYMPTOMS FOR UP TO 2 WEEKS 45 g 1   [employer] ASPIRIN ADULT LOW DOSE 81 MG chewable tablet Chew 81 mg by mouth daily.     diphenhydrAMINE (BENADRYL) 50 MG tablet Take 0.5-1 tablets (25-50 mg total) by mouth every 6 (six) hours as needed for itching (Take with oxycodone). 60 tablet 0   ergocalciferol (VITAMIN D2) 1.25 MG (50000 UT) capsule Take 1 capsule by mouth once a week.     famotidine (PEPCID) 40 MG tablet Take 40 mg by mouth at bedtime.     LORazepam (ATIVAN) 1 MG tablet Take 1 mg by mouth 2 (two) times daily.     losartan (COZAAR) 25 MG tablet Take 1 tablet by mouth daily.     metoprolol tartrate (LOPRESSOR) 25 MG tablet Take 0.5 tablets (12.5 mg total) by mouth 2 (two) times daily. 90 tablet 3   montelukast (SINGULAIR) 10 MG tablet SMARTSIG:1 Tablet(s) By Mouth Every Evening     pantoprazole (PROTONIX) 20 MG tablet Take 20 mg by mouth 2 (two) times daily.     rosuvastatin (CRESTOR) 10 MG tablet Take 10 mg by mouth at bedtime.     NITROSTAT 0.4 MG SL tablet Place 0.4 mg under the tongue every 5 (five) minutes as needed for chest pain.  (Patient not taking: Reported on 12/11/2021)     No current facility-administered medications for this visit.     PHYSICAL EXAMINATION:  Vitals:   12/11/21 1307  BP: 110/77  Pulse: 79  Resp: 18  Temp: 98.1 F (36.7 C)   Filed Weights   12/11/21 1307  Weight: 224 lb 14.4 oz (102 kg)    Physical Exam  LABORATORY DATA:  I have reviewed the data as listed Lab Results  Component  Value Date   WBC 9.2 12/11/2021   HGB 13.9 12/11/2021   HCT 44.6  12/11/2021   MCV 92.7 12/11/2021   PLT 258 12/11/2021   No results for input(s): "NA", "K", "CL", "CO2", "GLUCOSE", "BUN", "CREATININE", "CALCIUM", "GFRNONAA", "GFRAA", "PROT", "ALBUMIN", "AST", "ALT", "ALKPHOS", "BILITOT", "BILIDIR", "IBILI" in the last 8760 hours. Iron/TIBC/Ferritin/ %Sat    Component Value Date/Time   IRON 76 12/11/2021 1247   TIBC 364 12/11/2021 1247   FERRITIN 39 12/11/2021 1247   IRONPCTSAT 21 12/11/2021 1247      RADIOGRAPHIC STUDIES: I have personally reviewed the radiological images as listed and agreed with the findings in the report. No results found.

## 2021-12-11 NOTE — Progress Notes (Unsigned)
   Follow Up Note  RE: Brenda Craig MRN: [id number] DOB: 03/26/1960 Date of Office Visit: 12/13/2021  Referring provider: Albina Billet, MD Primary care provider: Albina Billet, MD  History of Present Illness: I had the pleasure of seeing Brenda Craig for a follow up visit at the [facility] on 12/13/2021. She is a 61 y.o. female, who is being followed for chronic cough, concern for metal contact dermatitis. Today she is here for initial patch test interpretation, given suspected history of contact dermatitis.   Diagnostics:   METAL PATCH TEST 48 hour reading: negative  Metals Patch - 12/13/21 1600     Time Antigen Placed 1622    Manufacturer [employer]    Location Back    Number of Test 11    Lot # [id number]    Reading Interval Day 5;Day 1;Day 3    Select Select    Aluminum Hydroxide 10% 0    Chromium chloride 1% 0    Cobalt chloride hexahydrate 1% 0    Molybdenum chloride 0.5% 0    Nickel sulfate hexahydrate 5% 0    Potassium dichromate 0.25% 0    Copper sulfate pentahydrate 2% 0    Tantal 1% 0    Titanium 0.1% 0    Manganese chloride 0.5% 0    Vanadium Pentoxide 10% 0              Assessment and Plan: Brenda Craig is a 61 y.o. female with: Concern for Contact Dermatitis:  Patient to return in 2 days for 96 hour read Discussed skin care during patch reads   It was my pleasure to see Brenda Craig today and participate in her care. Please feel free to contact me with any questions or concerns.  Sincerely,   Sigurd Sos, MD [facility]

## 2021-12-11 NOTE — Assessment & Plan Note (Signed)
See handout for lifestyle and dietary modifications. Continue meds.

## 2021-12-11 NOTE — Assessment & Plan Note (Signed)
Recently started on metoprolol. Initial blood pressure reading was 95/58, repeat was 118/86. Denies lightheadedness or dizziness. Monitor your blood pressure at home.

## 2021-12-11 NOTE — Assessment & Plan Note (Signed)
Consider repeat allergy testing - skin testing vs bloodwork in future.  Monitor symptoms.

## 2021-12-11 NOTE — Assessment & Plan Note (Signed)
#  Iron deficiency secondary to her celiac disease.  Not able to tolerate oral iron supplementation. Labs are reviewed and discussed with patient. Normal hemoglobin and iron panel.  No need for IV venofer.

## 2021-12-11 NOTE — Assessment & Plan Note (Signed)
Pruritus and erythema with Nickel jewelry and watches. No issues with silver/gold. Left knee replaces 2 years ago but still having pain and separating. Planning on replacing the replacement.  Discussed with patient that patch testing tests for contact dermatitis and sometimes it does not correlate to how one will react to metals in the body. Positive patch testing results can help in avoiding those items however it is possible to get false negative results.  Nevertheless, this is the most accessible test for metal sensitivity currently available.  Even if patch testing comes back negative, would be cautious about using nickel containing items as she still had issues within the past few years with nickel containing jewelry.  Metal patches placed today. Please avoid strenuous physical activities and do not get the patches on the back wet. No showering until final patch reading done. Okay to take antihistamines for itching but avoid placing any creams on the back where the patches are. We will remove the patches on Wednesday and will do our initial read. Then you will come back on Friday and next Monday.

## 2021-12-13 ENCOUNTER — Ambulatory Visit: Payer: BC Managed Care – PPO | Admitting: Internal Medicine

## 2021-12-13 ENCOUNTER — Encounter: Payer: Self-pay | Admitting: Internal Medicine

## 2021-12-13 VITALS — Temp 98.1°F

## 2021-12-13 DIAGNOSIS — L2389 Allergic contact dermatitis due to other agents: Secondary | ICD-10-CM

## 2021-12-15 ENCOUNTER — Encounter: Payer: BC Managed Care – PPO | Admitting: Internal Medicine

## 2021-12-18 ENCOUNTER — Encounter: Payer: Self-pay | Admitting: Family Medicine

## 2021-12-18 ENCOUNTER — Other Ambulatory Visit: Payer: Self-pay

## 2021-12-18 ENCOUNTER — Ambulatory Visit: Payer: BC Managed Care – PPO | Admitting: Family Medicine

## 2021-12-18 VITALS — BP 100/68 | HR 83 | Temp 97.9°F | Resp 16 | Ht 63.5 in | Wt 225.4 lb

## 2021-12-18 DIAGNOSIS — L2389 Allergic contact dermatitis due to other agents: Secondary | ICD-10-CM

## 2021-12-18 NOTE — Progress Notes (Signed)
    Follow-up Note  RE: Brenda Craig MRN: [id number] DOB: [birth year] Date of Office Visit: 12/18/2021  Primary care provider: Albina Billet, MD Referring provider: Albina Billet, MD   Brenda Craig returns to the office today for the final patch test interpretation, given suspected history of contact dermatitis. She was originally seen in this clinic on 12/11/2021 by Dr. Maudie Mercury for evaluation of suspected metal allergy in the setting of possible joint replacement and chronic cough. Patient reports that she has had issues with jewelry containing nickel over the last few years.    Diagnostics:   Metals patch test  7 day  reading: Negative      Metals Patch     Time Antigen Placed  12/11/2021    Location  Back    Number of Test  11    Reading Interval  Day 7    Chromium chloride 1%  0     Cobalt chloride hexahydrate 1%  0     Molybdenum chloride 0.5%  0     Nickel sulfate hexahydrate 5%  0     Potassium dichromate 0.25%  0     Copper sulfate pentahydrate 2%  0     Titanium 0.1%  0     Manganese chloride 0.5%  0     Tantal 1%  0    Vanadium pentoxide 10%  0   Aluminum hydroxide 10%  0    Comments  N/A    Plan:   Allergic contact dermatitis Testing negative to the entire metals patch. Would be cautious about using nickel containing metals as she has experienced issues with nickel containing jewelry within the last few years.   Thank you for the opportunity to care for this patient.  Please do not hesitate to contact me with questions.  Gareth Morgan, FNP [facility]

## 2021-12-28 NOTE — Progress Notes (Signed)
Cardiology Office Note  Date:  12/29/2021   ID:  Brenda Craig, DOB May 10, 1960, MRN [id number]  PCP:  Albina Billet, MD   Chief Complaint  Patient presents with   Follow-up    6 month follow up. Cardiac clearance for knee surgery. Patient states that metoprolol makes her feel tired and down. Patient would like to discuss if that medication is the right medication for her at this time. Metoprolol has also lowered her blood pressure. Meds reviewed with patient.     HPI:  Brenda Craig is a 61 year old woman with past medical history of Back pain (anginal equiv) Coronary artery disease ---ST elevation myocardial infarction complicated by ventricular fibrillation requiring circumflex stent May 2013.  ---LAD stenting March 2014.   ---Catheterization 10/18/2014 showed patent stents with 50% mid LAD stenosis with negative FFR.  Who presents for follow-up of her coronary disease, preop cardiac evaluation for orthopedic procedure  Last seen by myself in clinic January 2023 Previously followed at [facility]  In follow-up today denies significant chest pain concerning for angina Has fatigue on metoprolol to tartrate and requesting alternative Chronic Knee pain, has to have have surgery on left, to be performed in [place] revision on left  Has chronic cough which she has had for years, followed by pulmonary Feels prior COVID infection made it worse  No recent lipid panel available from primary care Previously well-controlled, LDL of 44 in May 2021 on Crestor 10  Recent stress test October 2023   Findings are consistent with no ischemia. The study is low risk.   No ST deviation was noted.   The left ventricular ejection fraction is hyperdynamic (>65%).  EKG personally reviewed by myself on todays visit Normal sinus rhythm rate 79 bpm no significant ST-T wave changes  Prior cardiac history as below Chest pain November 22 while at [facility], seen in the  hospital Stress test at that time, a low risk study  December 2017 Cardiolite scan negative for ischemia.  Ejection fraction 75%.   -She has had no recent angina with no use of nitroglycerin in the last year.   urosepsis and developed Covid infection during the hospitalization. She has fully recovered and also has received both doses of the COVID-19 vaccine   PMH:   has a past medical history of Abnormal Pap smear of cervix (1988), Acid reflux (01/17/2013), Allergic rhinitis, Anemia, Anginal pain ([place]), Anxiety, Arthritis, Asthma, Celiac disease, Cervical dysplasia (1988), CHF (congestive heart failure) ([place]), Complication of anesthesia, Coronary artery disease, Headache, Heart trouble, History of cardiac arrest, History of hiatal hernia, History of kidney stones, History of mammogram (03/25/2013; 12/07/14), History of Papanicolaou smear of cervix (03/20/12; 12/28/15), Hypercholesteremia, Hypertension, IBS (irritable bowel syndrome), Menopausal symptoms, MI (myocardial infarction) ([place]), Mitral valve prolapse, Pneumonia, PONV (postoperative nausea and vomiting), Sleep apnea, and Vulvovaginitis.  PSH:    Past Surgical History:  Procedure Laterality Date   ABLATION  2013   [facility]   BACK SURGERY     BACK SURGERY  2016; 2017   L4, L5   CARDIAC SURGERY  03/19/12; 10/2014   HEART CATH AND STENT   CESAREAN SECTION     1986; [place]   COLONOSCOPY  08/2010   16 POLYPS (ALL BENIGN) DX C CELIAC DISEASE   COLONOSCOPY N/A 02/17/2021   Procedure: COLONOSCOPY;  Surgeon: [name], MD;  Location: [facility] ENDOSCOPY;  Service: Endoscopy;  Laterality: N/A;   COMBINED HYSTEROSCOPY DIAGNOSTIC /  D&C  2013   [facility]   CYSTOSCOPY W/ RETROGRADES Left 01/20/2019   Procedure: CYSTOSCOPY WITH RETROGRADE PYELOGRAM;  Surgeon: Abbie Sons, MD;  Location: [facility] ORS;  Service: Urology;  Laterality: Left;   CYSTOSCOPY W/ URETERAL STENT PLACEMENT Left 12/13/2018   Procedure: CYSTOSCOPY WITH  RETROGRADE PYELOGRAM/URETERAL STENT PLACEMENT;  Surgeon: Irine Seal, MD;  Location: [facility] ORS;  Service: Urology;  Laterality: Left;   CYSTOSCOPY/URETEROSCOPY/HOLMIUM LASER/STENT PLACEMENT Left 01/20/2019   Procedure: CYSTOSCOPY/URETEROSCOPY/HOLMIUM LASER/STENT EXCHANGE;  Surgeon: Abbie Sons, MD;  Location: [facility] ORS;  Service: Urology;  Laterality: Left;   DILATION AND CURETTAGE OF UTERUS  2001   ESOPHAGOGASTRODUODENOSCOPY  08/2010   DX C CELIAC DISEASE   ESOPHAGOGASTRODUODENOSCOPY N/A 02/17/2021   Procedure: ESOPHAGOGASTRODUODENOSCOPY (EGD);  Surgeon: [name], MD;  Location: [facility] ENDOSCOPY;  Service: Endoscopy;  Laterality: N/A;   HEART STENTS  05/27/2011   HIP SURGERY  2016   RIGHT IT BAND AND BURSA REMOVAL   OOPHORECTOMY Right 1982   TOTAL KNEE ARTHROPLASTY Left 01/19/2020   Procedure: TOTAL KNEE ARTHROPLASTY;  Surgeon: Renette Butters, MD;  Location: [facility] ORS;  Service: Orthopedics;  Laterality: Left;    Current Outpatient Medications  Medication Sig Dispense Refill   albuterol (PROVENTIL) (2.5 MG/3ML) 0.083% nebulizer solution Take 3 mLs (2.5 mg total) by nebulization every 4 (four) hours as needed for wheezing or shortness of breath (coughing fits). 75 mL 1   albuterol (VENTOLIN HFA) 108 (90 Base) MCG/ACT inhaler Inhale 1-2 puffs into the lungs every 4 (four) hours as needed for wheezing or shortness of breath.     benzonatate (TESSALON) 200 MG capsule Take 200 mg by mouth as needed.     budesonide (PULMICORT) 0.5 MG/2ML nebulizer solution Take 2 mLs (0.5 mg total) by nebulization in the morning and at bedtime. 120 mL 2   budesonide-formoterol (SYMBICORT) 80-4.5 MCG/ACT inhaler Inhale 2 puffs into the lungs in the morning and at bedtime. with spacer and rinse mouth afterwards. 1 each 5   chlorpheniramine-HYDROcodone 10-8 MG/5ML SMARTSIG:5 Milliliter(s) By Mouth Every 12 Hours     clotrimazole-betamethasone (LOTRISONE) cream APPLY TO AFFECTED AREA TWICE A DAY AS NEEDED FOR  SYMPTOMS FOR UP TO 2 WEEKS 45 g 1   [employer] ASPIRIN ADULT LOW DOSE 81 MG chewable tablet Chew 81 mg by mouth daily.     diphenhydrAMINE (BENADRYL) 50 MG tablet Take 0.5-1 tablets (25-50 mg total) by mouth every 6 (six) hours as needed for itching (Take with oxycodone). 60 tablet 0   ergocalciferol (VITAMIN D2) 1.25 MG (50000 UT) capsule Take 1 capsule by mouth once a week.     famotidine (PEPCID) 40 MG tablet Take 40 mg by mouth at bedtime.     LORazepam (ATIVAN) 1 MG tablet Take 1 mg by mouth 2 (two) times daily.     losartan (COZAAR) 25 MG tablet Take 1 tablet by mouth daily.     metoprolol tartrate (LOPRESSOR) 25 MG tablet Take 0.5 tablets (12.5 mg total) by mouth 2 (two) times daily. 90 tablet 3   montelukast (SINGULAIR) 10 MG tablet SMARTSIG:1 Tablet(s) By Mouth Every Evening     NITROSTAT 0.4 MG SL tablet Place 0.4 mg under the tongue every 5 (five) minutes as needed for chest pain.     pantoprazole (PROTONIX) 20 MG tablet Take 20 mg by mouth 2 (two) times daily.     rosuvastatin (CRESTOR) 10 MG tablet Take 10 mg by mouth at bedtime.     No current facility-administered  medications for this visit.    Allergies:   Other, Codeine, Esomeprazole magnesium, Iodinated contrast media, Omeprazole magnesium, Oxycodone-acetaminophen, Percocet [oxycodone-acetaminophen], and Tramadol   Social History:  The patient  reports that she has never smoked. She has never been exposed to tobacco smoke. She has never used smokeless tobacco. She reports current alcohol use of about 1.0 standard drink of alcohol per week. She reports that she does not use drugs.   Family History:   family history includes CAD in her father; Cancer in her maternal uncle; Cerebrovascular Accident in her father; Diabetes in her father; Heart disease in her father; Hyperthyroidism in her mother; Hypothyroidism in her father; Transient ischemic attack in her father; Uterine cancer (age of onset: 32) in her maternal aunt.    Review of  Systems: Review of Systems  Constitutional: Negative.   HENT: Negative.    Respiratory: Negative.    Cardiovascular: Negative.   Gastrointestinal: Negative.   Musculoskeletal: Negative.   Neurological: Negative.   Psychiatric/Behavioral: Negative.    All other systems reviewed and are negative.   PHYSICAL EXAM: VS:  BP 104/64 (BP Location: Left Arm, Patient Position: Sitting, Cuff Size: Large)   Pulse 79   Ht 5' 4"  (1.626 m)   Wt 225 lb 6.4 oz (102.2 kg)   SpO2 92%   BMI 38.69 kg/m  , BMI Body mass index is 38.69 kg/m. Constitutional:  oriented to person, place, and time. No distress.  HENT:  Head: Grossly normal Eyes:  no discharge. No scleral icterus.  Neck: No JVD, no carotid bruits  Cardiovascular: Regular rate and rhythm, no murmurs appreciated Pulmonary/Chest: Clear to auscultation bilaterally, no wheezes or rails Abdominal: Soft.  no distension.  no tenderness.  Musculoskeletal: Normal range of motion Neurological:  normal muscle tone. Coordination normal. No atrophy Skin: Skin warm and dry Psychiatric: normal affect, pleasant  Recent Labs: 12/11/2021: Hemoglobin 13.9; Platelets 258    Lipid Panel Lab Results  Component Value Date   CHOL 106 08/22/2015   HDL 42 08/22/2015   LDLCALC 43 08/22/2015   TRIG 107 08/22/2015      Wt Readings from Last 3 Encounters:  12/29/21 225 lb 6.4 oz (102.2 kg)  12/18/21 225 lb 6.4 oz (102.2 kg)  12/11/21 224 lb 14.4 oz (102 kg)     ASSESSMENT AND PLAN:  Problem List Items Addressed This Visit       Cardiology Problems   Essential hypertension   Other Visit Diagnoses     Coronary artery disease involving native coronary artery of native heart with other form of angina pectoris ([place])    -  Primary   Hyperlipidemia LDL goal <70       Palpitations         Preop cardiovascular valuation Acceptable risk for knee surgery, no further testing needed  Coronary disease with stable angina Currently with no symptoms of  angina. No further workup at this time. Continue current medication regimen. Recent stress test October 2023, low risk study No further workup needed  Hyperlipidemia Continue Crestor 10, goal LDL less than 55  Obesity We have encouraged continued exercise, careful diet management in an effort to lose weight.  Essential hypertension Recommend she hold metoprolol tartrate given side effects Recommend she start Bystolic 5 mg in the p.m., continue losartan 25 mg in the morning If blood pressure runs low would decrease losartan down to 12.5 daily   Total encounter time more than 30 minutes  Greater than 50% was spent in  counseling and coordination of care with the patient    Signed, Esmond Plants, M.D., Ph.D. [place], [place]

## 2021-12-29 ENCOUNTER — Encounter: Payer: Self-pay | Admitting: Cardiovascular Disease

## 2021-12-29 ENCOUNTER — Ambulatory Visit: Payer: BC Managed Care – PPO | Attending: Cardiovascular Disease | Admitting: Cardiovascular Disease

## 2021-12-29 VITALS — BP 104/64 | HR 79 | Ht 64.0 in | Wt 225.4 lb

## 2021-12-29 DIAGNOSIS — I25118 Atherosclerotic heart disease of native coronary artery with other forms of angina pectoris: Secondary | ICD-10-CM | POA: Diagnosis not present

## 2021-12-29 DIAGNOSIS — E785 Hyperlipidemia, unspecified: Secondary | ICD-10-CM | POA: Diagnosis not present

## 2021-12-29 DIAGNOSIS — I10 Essential (primary) hypertension: Secondary | ICD-10-CM | POA: Diagnosis not present

## 2021-12-29 DIAGNOSIS — R002 Palpitations: Secondary | ICD-10-CM

## 2021-12-29 MED ORDER — NEBIVOLOL HCL 5 MG PO TABS: 5.0000 mg | ORAL_TABLET | Freq: Every day | ORAL | 3 refills | Status: DC

## 2021-12-29 NOTE — Patient Instructions (Addendum)
Medication Instructions:  STOP  the metoprolol tartrate START bystolic 5 mg once a day at dinner  If blood pressure runs low, Cut the losartan in 1/2 daily (12.5 mg)  If you need a refill on your cardiac medications before your next appointment, please call your pharmacy.   Lab work: No new labs needed  Testing/Procedures: No new testing needed  Follow-Up: At [facility], you and your health needs are our priority.  As part of our continuing mission to provide you with exceptional heart care, we have created designated Provider Care Teams.  These Care Teams include your primary Cardiologist (physician) and Advanced Practice Providers (APPs -  Physician Assistants and Nurse Practitioners) who all work together to provide you with the care you need, when you need it.  You will need a follow up appointment in 12 months  Providers on your designated Care Team:   Murray Hodgkins, NP Christell Faith, PA-C Cadence Kathlen Mody, [place]  COVID-19 Vaccine Information can be found at: [email or web address].uk For questions related to vaccine distribution or appointments, please email [email or web address] or call [phone].

## 2022-01-03 ENCOUNTER — Encounter: Payer: Self-pay | Admitting: Primary Care

## 2022-01-03 ENCOUNTER — Ambulatory Visit: Payer: BC Managed Care – PPO | Admitting: Primary Care

## 2022-01-03 VITALS — BP 116/72 | HR 61 | Temp 97.8°F | Ht 64.0 in | Wt 225.4 lb

## 2022-01-03 DIAGNOSIS — G4733 Obstructive sleep apnea (adult) (pediatric): Secondary | ICD-10-CM | POA: Diagnosis not present

## 2022-01-03 DIAGNOSIS — R053 Chronic cough: Secondary | ICD-10-CM

## 2022-01-03 DIAGNOSIS — J3089 Other allergic rhinitis: Secondary | ICD-10-CM

## 2022-01-03 DIAGNOSIS — J4520 Mild intermittent asthma, uncomplicated: Secondary | ICD-10-CM | POA: Diagnosis not present

## 2022-01-03 DIAGNOSIS — Z8709 Personal history of other diseases of the respiratory system: Secondary | ICD-10-CM

## 2022-01-03 MED ORDER — AZELASTINE HCL 0.1 % NA SOLN: 2.0000 | Freq: Two times a day (BID) | NASAL | 1 refills | Status: DC

## 2022-01-03 MED ORDER — FLUTICASONE PROPIONATE 50 MCG/ACT NA SUSP: 2.0000 | Freq: Every day | NASAL | 1 refills | Status: DC

## 2022-01-03 MED ORDER — DOXYCYCLINE HYCLATE 100 MG PO TABS: 100.0000 mg | ORAL_TABLET | Freq: Two times a day (BID) | ORAL | 0 refills | Status: DC

## 2022-01-03 NOTE — Assessment & Plan Note (Addendum)
-   Patient is 80% compliant with CPAP use greater than 4 hours over the last 30 days.  Current pressure 5 to 15 cm H2O (9.4 cm H2O-95%); AHI 0.6.  No changes.  DME order placed for new CPAP machine.

## 2022-01-03 NOTE — Progress Notes (Addendum)
@Patient  ID: Brenda Craig, female    DOB: 1960/09/24, 61 y.o.   MRN: [id number]  Chief Complaint  Patient presents with   Follow-up    Wearing cpap nightly-needs renewal. C/o prod cough with yellow mucus and some wheezing.     Referring provider: Albina Billet, MD  HPI: 61 year old female, never smoked. PMH significant for OSA, chronic cough, hyperreactive airways, chronic respiratory failure with hypoxia, pneumonia, HTN, CAD, acid reflux, celiac disease, hyperlipidemia. Patient of Dr. Mortimer Fries.  Previous [facility] pulmonary encounter: 02/24/2021 Patient presents today for 6 month follow-up/ OSA and chronic cough. Patient has been having cough fits, worse lately during the day time. Patient had pneumonia in January 2022. CXR in June 2022 showed no acute process, unchanged bandlike scarring right midlung and left lung base. She is using breztri aerosphere but feels it is not working. She has been on nebulizer in the past with better results. She is currently using 2L oxygen at night blended into her CPAP. She has not had ONO.    Airview download 01/24/21-02/22/21 Usage days 29/30 days; 97% > 4 hours Average usage 7 hours 16 mins Pressure 5-10cm h20 (9.8cm h20-95%) Airleaks 22.9L/min (95%) AHI 0.6   01/03/2022- Interim hx  Patient presents today for OSA follow-up/ OSA and chronic cough.  Needing surgical clearance for revision left total knee arthoplasty, planning for March.  She has hx chronic cough for >10 years, currently productive cough with yellow sputum. No associated shortness of breath. She has not needed to use Albuterol. Started on symbicort by allergist recently. She has scarring in the past on CXR, no high resolution imaging.   Patient is complaint with CPAP 80% > 4 hours last 30 days. Pressure 5-10cm h20 without residual apneas. No issues with pressure settings or mask. Needs order for new machine, current one is > 27 years old.   Airview download 12/02/21-12/31/21 26/30 days  (87%); 24 days (80%) > 4 hours Pressure 5-10cm h20 (9.4cm h20-95%) Airleaks 36L/min (95%) AHI 0.6  Allergies  Allergen Reactions   Other     Gluten allergy   Codeine Nausea And Vomiting and Rash   Esomeprazole Magnesium Palpitations and Other (See Comments)    IRREGULAR HEARTBEAT   Iodinated Contrast Media Itching   Omeprazole Magnesium Palpitations and Other (See Comments)    IRREGULAR HEARTBEAT   Oxycodone-Acetaminophen Itching, Nausea And Vomiting and Rash    Can take with benadryl   Percocet [Oxycodone-Acetaminophen] Itching, Nausea And Vomiting and Rash    Can take with benadryl    Tramadol Rash    Can take with benadryl     Immunization History  Administered Date(s) Administered   Influenza,inj,Quad PF,6+ Mos 05/25/2013, 11/13/2017, 10/17/2018   Influenza-Unspecified 10/10/2020   PFIZER Comirnaty(Gray Top)Covid-19 Tri-Sucrose Vaccine 05/06/2020   PFIZER(Purple Top)SARS-COV-2 Vaccination 04/16/2019, 05/07/2019   Pneumococcal Polysaccharide-23 12/16/2018    Past Medical History:  Diagnosis Date   Abnormal Pap smear of cervix 1988   Acid reflux 01/17/2013   Allergic rhinitis    Anemia    Anginal pain ([facility])    Anxiety    Arthritis    Asthma    Celiac disease    Cervical dysplasia 1988   CHF (congestive heart failure) ([facility])    Complication of anesthesia    bp drops after anesthesia and has to be kept overnight   Coronary artery disease    Headache    migraine with visual aura   Heart trouble    History of cardiac  arrest    History of hiatal hernia    History of kidney stones    History of mammogram 03/25/2013; 12/07/14   BIRADS 1; NEG   History of Papanicolaou smear of cervix 03/20/12; 12/28/15   -/-; -/-   Hypercholesteremia    Hypertension    IBS (irritable bowel syndrome)    Menopausal symptoms    MI (myocardial infarction) ([place])    Mitral valve prolapse    Pneumonia    PONV (postoperative nausea and vomiting)    nausea only   Sleep apnea     Vulvovaginitis    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    Tobacco History: Social History   Tobacco Use  Smoking Status Never   Passive exposure: Never  Smokeless Tobacco Never   Counseling given: Not Answered   Outpatient Medications Prior to Visit  Medication Sig Dispense Refill   albuterol (PROVENTIL) (2.5 MG/3ML) 0.083% nebulizer solution Take 3 mLs (2.5 mg total) by nebulization every 4 (four) hours as needed for wheezing or shortness of breath (coughing fits). 75 mL 1   albuterol (VENTOLIN HFA) 108 (90 Base) MCG/ACT inhaler Inhale 1-2 puffs into the lungs every 4 (four) hours as needed for wheezing or shortness of breath.     benzonatate (TESSALON) 200 MG capsule Take 200 mg by mouth as needed.     budesonide-formoterol (SYMBICORT) 80-4.5 MCG/ACT inhaler Inhale 2 puffs into the lungs in the morning and at bedtime. with spacer and rinse mouth afterwards. 1 each 5   chlorpheniramine-HYDROcodone 10-8 MG/5ML SMARTSIG:5 Milliliter(s) By Mouth Every 12 Hours     clotrimazole-betamethasone (LOTRISONE) cream APPLY TO AFFECTED AREA TWICE A DAY AS NEEDED FOR SYMPTOMS FOR UP TO 2 WEEKS 45 g 1   CVS ASPIRIN ADULT LOW DOSE 81 MG chewable tablet Chew 81 mg by mouth daily.     diphenhydrAMINE (BENADRYL) 50 MG tablet Take 0.5-1 tablets (25-50 mg total) by mouth every 6 (six) hours as needed for itching (Take with oxycodone). 60 tablet 0   ergocalciferol (VITAMIN D2) 1.25 MG (50000 UT) capsule Take 1 capsule by mouth once a week.     famotidine (PEPCID) 40 MG tablet Take 40 mg by mouth at bedtime.     LORazepam (ATIVAN) 1 MG tablet Take 1 mg by mouth 2 (two) times daily.     losartan (COZAAR) 25 MG tablet Take 1 tablet by mouth daily.     montelukast (SINGULAIR) 10 MG tablet SMARTSIG:1 Tablet(s) By Mouth Every Evening     nebivolol (BYSTOLIC) 5 MG tablet Take 1 tablet (5 mg total) by mouth daily with supper. 90 tablet 3   NITROSTAT 0.4 MG SL tablet Place 0.4 mg under the tongue  every 5 (five) minutes as needed for chest pain.     pantoprazole (PROTONIX) 20 MG tablet Take 20 mg by mouth 2 (two) times daily.     rosuvastatin (CRESTOR) 10 MG tablet Take 10 mg by mouth at bedtime.     budesonide (PULMICORT) 0.5 MG/2ML nebulizer solution Take 2 mLs (0.5 mg total) by nebulization in the morning and at bedtime. 120 mL 2   No facility-administered medications prior to visit.    Review of Systems  Review of Systems  Constitutional: Negative.   HENT:  Positive for congestion and postnasal drip.   Respiratory:  Positive for cough. Negative for chest tightness, shortness of breath and wheezing.    Physical Exam  BP 116/72 (BP Location: Left Arm, Cuff Size: Normal)  Pulse 61   Temp 97.8 F (36.6 C) (Temporal)   Ht 5' 4"  (1.626 m)   Wt 225 lb 6.4 oz (102.2 kg)   SpO2 97%   BMI 38.69 kg/m  Physical Exam Constitutional:      Appearance: Normal appearance.  HENT:     Nose: Congestion present.     Mouth/Throat:     Mouth: Mucous membranes are moist.  Cardiovascular:     Rate and Rhythm: Normal rate and regular rhythm.  Pulmonary:     Effort: Pulmonary effort is normal.     Breath sounds: Normal breath sounds. No wheezing, rhonchi or rales.  Musculoskeletal:        General: Normal range of motion.  Skin:    General: Skin is warm and dry.  Neurological:     General: No focal deficit present.     Mental Status: She is alert and oriented to person, place, and time. Mental status is at baseline.  Psychiatric:        Mood and Affect: Mood normal.        Behavior: Behavior normal.        Thought Content: Thought content normal.        Judgment: Judgment normal.      Lab Results:  CBC    Component Value Date/Time   WBC 9.2 12/11/2021 1247   RBC 4.77 12/11/2021 1247   RBC 4.81 12/11/2021 1247   HGB 13.9 12/11/2021 1247   HGB 11.9 (L) 06/10/2013 0209   HCT 44.6 12/11/2021 1247   HCT 36.1 06/10/2013 0209   PLT 258 12/11/2021 1247   PLT 214 06/10/2013  0209   MCV 92.7 12/11/2021 1247   MCV 92 06/10/2013 0209   MCH 28.9 12/11/2021 1247   MCHC 31.2 12/11/2021 1247   RDW 14.2 12/11/2021 1247   RDW 15.2 (H) 06/10/2013 0209   LYMPHSABS 2.3 12/11/2021 1247   LYMPHSABS 2.0 06/10/2013 0209   MONOABS 0.7 12/11/2021 1247   MONOABS 0.7 06/10/2013 0209   EOSABS 0.0 12/11/2021 1247   EOSABS 0.1 06/10/2013 0209   BASOSABS 0.1 12/11/2021 1247   BASOSABS 0.0 06/10/2013 0209    BMET    Component Value Date/Time   NA 139 01/23/2020 1136   NA 143 06/10/2013 0209   K 3.4 (L) 01/23/2020 1136   K 3.7 06/10/2013 0209   CL 100 01/23/2020 1136   CL 109 (H) 06/10/2013 0209   CO2 28 01/23/2020 1136   CO2 29 06/10/2013 0209   GLUCOSE 103 (H) 01/23/2020 1136   GLUCOSE 90 06/10/2013 0209   BUN 18 01/23/2020 1136   BUN 14 06/10/2013 0209   CREATININE 0.97 01/23/2020 1136   CREATININE 0.87 06/10/2013 0209   CALCIUM 8.9 01/23/2020 1136   CALCIUM 8.9 06/10/2013 0209   GFRNONAA >60 01/23/2020 1136   GFRNONAA >60 06/10/2013 0209   GFRAA >60 12/18/2018 0343   GFRAA >60 06/10/2013 0209    BNP    Component Value Date/Time   BNP 40.0 01/22/2020 0832    ProBNP No results found for: "PROBNP"  Imaging: No results found.   Assessment & Plan:   Airway hyperreactivity - Stable; Not acutely exacerbated. No wheezing on exam.  - Continue Symbicort 66mg two puffs morning and evening; Albuterol nebulizer every 6-8 hours as needed for sob/wheezing   Chronic cough - Patient has had a chronic cough for >10 years. She has scarring to right middle lung and left lower lung on prior chest imaging. Cough is currently  productive with yellow mucus. No associated shortness of breath. I suspect PND is driving a lot of her coughing symptoms. Sending in RX for doxycycline 130m twice daily x 7 days. Advised she start mucinex 12071mtwice daily and flutter valve TID to loosen congestion. Needs high resolution CT chest to evaluation for ILD or bronchiectasis. FU in 1  week to ensure cough has improved.   Other allergic rhinitis - Start loratadine 1023maily and Fluticasone + Astelin nasal spray as directed  - Continue Singulair 38m71m bedtime   OSA (obstructive sleep apnea) - Patient is 80% compliant with CPAP use greater than 4 hours over the last 30 days.  Current pressure 5 to 15 cm H2O (9.4 cm H2O-95%); AHI 0.6.  No changes.  DME order placed for new CPAP machine.   Hx of respiratory failure - Wearing 2L oxygen at night with CPAP - Needs ONO on CPAP before discontinueing oxygen   ElizMartyn Ehrich 01/03/2022

## 2022-01-03 NOTE — Assessment & Plan Note (Signed)
-   Wearing 2L oxygen at night with CPAP - Needs ONO on CPAP before discontinueing oxygen

## 2022-01-03 NOTE — Patient Instructions (Addendum)
Recommendations: - Continue Symbicort two puffs morning and evening  - Use Albuterol nebulizer every 6-8 hours until cough is better  - Start flonase and Astelin nasal spray daily for 2-4 weeks or until cough is better  - Start over the counter Claritin for 2-4 weeks or until cough is better  - Take mucinex 1271m twice daily with full glass of water as needed to loosen chest congestion - Use flutter valve three times a day - Rx doxycycline 10108mtwice daily x 7 days  - Continue to wear CPAP every night  Orders: - ONO on CPAP  - HRCT re: chronic cough - New CPAP machine  Follow-up: - Virtual visit in 1 week with BeEustaquio MaizeP (Jan 3rd at 8:30am)

## 2022-01-03 NOTE — Assessment & Plan Note (Signed)
-   Start loratadine 64m daily and Fluticasone + Astelin nasal spray as directed  - Continue Singulair 14mat bedtime

## 2022-01-03 NOTE — Assessment & Plan Note (Addendum)
-   Stable; Not acutely exacerbated. No wheezing on exam.  - Continue Symbicort 33mg two puffs morning and evening; Albuterol nebulizer every 6-8 hours as needed for sob/wheezing

## 2022-01-03 NOTE — Assessment & Plan Note (Signed)
-   Patient has had a chronic cough for >10 years. She has scarring to right middle lung and left lower lung on prior chest imaging. Cough is currently productive with yellow mucus. No associated shortness of breath. I suspect PND is driving a lot of her coughing symptoms. Sending in RX for doxycycline 1109m twice daily x 7 days. Advised she start mucinex 12024mtwice daily and flutter valve TID to loosen congestion. Needs high resolution CT chest to evaluation for ILD or bronchiectasis. FU in 1 week to ensure cough has improved.

## 2022-01-10 ENCOUNTER — Telehealth (INDEPENDENT_AMBULATORY_CARE_PROVIDER_SITE_OTHER): Payer: BC Managed Care – PPO | Admitting: Primary Care

## 2022-01-10 DIAGNOSIS — R053 Chronic cough: Secondary | ICD-10-CM

## 2022-01-10 DIAGNOSIS — J3089 Other allergic rhinitis: Secondary | ICD-10-CM | POA: Diagnosis not present

## 2022-01-10 DIAGNOSIS — G4733 Obstructive sleep apnea (adult) (pediatric): Secondary | ICD-10-CM | POA: Diagnosis not present

## 2022-01-10 NOTE — Progress Notes (Unsigned)
Virtual Visit via Telephone Note  I connected with Brenda Craig on 01/10/22 at  8:30 AM EST by telephone and verified that I am speaking with the correct person using two identifiers.  Location: Patient: Home Provider: Office    I discussed the limitations, risks, security and privacy concerns of performing an evaluation and management service by telephone and the availability of in person appointments. I also discussed with the patient that there may be a patient responsible charge related to this service. The patient expressed understanding and agreed to proceed.   History of Present Illness: 62 year old female, never smoked. PMH significant for OSA, chronic cough, hyperreactive airways, chronic respiratory failure with hypoxia, pneumonia, HTN, CAD, acid reflux, celiac disease, hyperlipidemia. Patient of Dr. Mortimer Fries.  Previous [facility] pulmonary encounter: 02/24/2021 Patient presents today for 6 month follow-up/ OSA and chronic cough. Patient has been having cough fits, worse lately during the day time. Patient had pneumonia in January 2022. CXR in June 2022 showed no acute process, unchanged bandlike scarring right midlung and left lung base. She is using breztri aerosphere but feels it is not working. She has been on nebulizer in the past with better results. She is currently using 2L oxygen at night blended into her CPAP. She has not had ONO.    Airview download 01/24/21-02/22/21 Usage days 29/30 days; 97% > 4 hours Average usage 7 hours 16 mins Pressure 5-10cm h20 (9.8cm h20-95%) Airleaks 22.9L/min (95%) AHI 0.6  01/03/2022 Patient presents today for OSA follow-up/ OSA and chronic cough.  Needing surgical clearance for revision left total knee arthoplasty, planning for March.  She has hx chronic cough for >10 years, currently productive cough with yellow sputum. No associated shortness of breath. She has not needed to use Albuterol. Started on symbicort by allergist recently. She has  scarring in the past on CXR, no high resolution imaging.   Patient is complaint with CPAP 80% > 4 hours last 30 days. Pressure 5-10cm h20 without residual apneas. No issues with pressure settings or mask. Needs order for new machine, current one is > 38 years old.    01/10/2022- Interim hx  Patient contacted today for virtual follow-up OSA and chronic cough.  Needing surgical clearance for revision left total knee arthoplasty, planning for March. Patient was treated for bronchitis symptoms 2 weeks ago with a course of doxycycline and prednisone taper.  Patient continues to have a cough which is chronic however purulence has improved.  She is no longer coughing up green mucus.  Has no other associated respiratory symptoms.  She is taking Symbicort twice daily, she has not needed to use albuterol rescue inhaler.  She has been ordered for high-resolution CAT scan to evaluate scarring right midlung and left lung base.   She is compliant with CPAP use.  We have ordered overnight oximetry test to ensure that she does not need oxygen at night.   Airview download 12/02/21-12/31/21 26/30 days (87%); 24 days (80%) > 4 hours Pressure 5-10cm h20 (9.4cm h20-95%) Airleaks 36L/min (95%) AHI 0.6   Observations/Objective:  - Appears well; Able to speak in full sentences, no overt shortness of breath or wheezing. Chronic cough  Assessment and Plan:  Chronic cough: - Acute symptoms resolved after completing doxy and pred course - CXR in the past showed scarring right midlung and left lung, we will get high resolution CT chest to better evaluate d/t chronic cough symptoms >6 months  - Continue Mucinex '1200mg'$  twice daily and flutter valve tid  with full glass of water as needed to loosen chest congestion  Allergic rhinitis: - Continue flonase and Astelin nasal spray daily until cough is better  - Continue Claritin '10mg'$  daily  Hyperreactive airway disease: - Continue Symbicort 11mg two puffs twice daily and  prn Albuterol hfa 2 puffs every 4-6 hours as needed for shortness of breath/wheezing   OSA: - Patient is complaint with CPAP and benefits from use - Current pressure 5-10cm h20; Residual AHI 0.6  Hx respiratory failure: - Wearing 2L oxygen at night with CPAP, needs overnight oximetry on CPAP before discontinuing oxygen   Pre-op respiratory exam: - Patient is considered intermittent risk for prolonged mechanical ventilation and/or post op pulmonary complications d/t history sleep apnea and hyperreactive airway disease. She is optimized from pulmonary standpoint for knee surgery, clearance will be decided upon by orthopedic surgeon and anesthesiology.  Recommend surgery be done inpatient setting and patient stay overnight for observation if needed.   Recommend 1. Short duration of surgery as much as possible and avoid paralytic if possible 2. Recovery in step down or ICU with Pulmonary consultation if needed  3. DVT prophylaxis 4. Aggressive pulmonary toilet with o2, bronchodilatation, and incentive spirometry and early ambulation   1) RISK FOR PROLONGED MECHANICAL VENTILAION - > 48h  1A) Arozullah - Prolonged mech ventilation risk Arozullah Postperative Pulmonary Risk Score - for mech ventilation dependence >48h ([employer] Ann Surg 2000, major non-cardiac surgery) Comment Score  Type of surgery - abd ao aneurysm (27), thoracic (21), neurosurgery / upper abdominal / vascular (21), neck (11) Orthopedic surgery  5  Emergency Surgery - (11)  0  ALbumin < 3 or poor nutritional state - (9)  0  BUN > 30 -  (8)  0  Partial or completely dependent functional status - (7)  0  COPD -  (6) FEV1 < 75% 6  Age - 60 to 641(4), > 70  (6)  4  TOTAL  15  Risk Stratifcation scores  - < 10 (0.5%), 11-19 (1.8%), 20-27 (4.2%), 28-40 (10.1%), >40 (26.6%)  11-19 (1.8%)      1B) GUPTA - Prolonged Mech Vent Risk Score source Risk  Guptal post op prolonged mech ventilation > 48h or reintubation < 30  days - ACS 2007-2008 dataset - hhttp://lewis-perez.info/0.3 % Risk of mechanical ventilation for >48 hrs after surgery, or unplanned intubation ?30 days of surgery    2) RISK FOR POST OP PNEUMONIA Score source Risk  GLyndel Safe- Post Op Pnemounia risk  hTonerProviders.co.za0.5 % Risk of postoperative pneumonia    R3) ISK FOR ANY POST-OP PULMONARY COMPLICATION Score source Risk  CANET/ARISCAT Score - risk for ANY/ALl pulmonary complications - > risk of in-hospital post-op pulmonary complications (composite including respiratory failure, respiratory infection, pleural effusion, atelectasis, pneumothorax, bronchospasm, aspiration pneumonitis) hSocietyMagazines.ca- based on age, anemia, pulse ox, resp infection prior 30d, incision site, duration of surgery, and emergency v elective surgery Intermediate risk 13.3% risk of in-hospital post-op pulmonary complications (composite including respiratory failure, respiratory infection, pleural effusion, atelectasis, pneumothorax, bronchospasm, aspiration pneumonitis)      Follow Up Instructions:   3 months with Dr. KMortimer Fries I discussed the assessment and treatment plan with the patient. The patient was provided an opportunity to ask questions and all were answered. The patient agreed with the plan and demonstrated an understanding of the instructions.   The patient was advised to call back or seek an in-person evaluation if the symptoms worsen or if the condition  fails to improve as anticipated.  I provided 22 minutes of non-face-to-face time during this encounter.   Martyn Ehrich, NP

## 2022-01-11 ENCOUNTER — Telehealth: Payer: Self-pay | Admitting: Pulmonary Disease

## 2022-01-11 ENCOUNTER — Telehealth: Payer: Self-pay | Admitting: Primary Care

## 2022-01-11 ENCOUNTER — Telehealth: Payer: Self-pay | Admitting: Cardiovascular Disease

## 2022-01-11 NOTE — Telephone Encounter (Signed)
Note is done, can we fax to [facility]. I had no form. They can send

## 2022-01-11 NOTE — Telephone Encounter (Signed)
I s/w the pt and assured her that Dr. Rockey Situ did fax over his ov notes giving clearance. However; I will be happy to re-fax notes to Dr. Stan Head surgery scheduler [facility] today. Pt thanked me for the help and the call.

## 2022-01-11 NOTE — Telephone Encounter (Signed)
Patient would like to confirm that her clearance recommendation has been sent. Please advise.

## 2022-01-11 NOTE — Telephone Encounter (Signed)
OV notes and clearance form have been faxed back to [facility]. Nothing further needed at this time.  °

## 2022-01-11 NOTE — Telephone Encounter (Signed)
Spoke to patient and verified that Brenda Craig will be doing her surgery. She is aware that notes have been faxed to Brenda Craig at [phone]. Nothing further needed.

## 2022-01-11 NOTE — Telephone Encounter (Signed)
Following up on ONO and HRCT ordered end of December- just wanted to make sure order was received/ get an idea on timing

## 2022-01-11 NOTE — Telephone Encounter (Signed)
The CT order was placed on 01/03/22 and I called and left a message for the patient to call me back to get the CT scheduled. The ONO order was faxed to [facility] which is who she has her 9 with. I called [facility] and spoke with [place] and she confirmed that they had the ONO order.  I have now left another message asking the patient to call me back to schedule her CT

## 2022-01-11 NOTE — Telephone Encounter (Signed)
Spoke to patient.  She is requesting update on surgical assessment. She is scheduled for knee replacement with Raliegh Ip.   Beth, do you have form? Patient had virtual visit with you yesterday.

## 2022-01-12 NOTE — Telephone Encounter (Signed)
Thanks Anita

## 2022-01-23 NOTE — Telephone Encounter (Signed)
I have now spoke with Brenda Craig and her CT has been scheduled on 02/01/22 @ 4:30pm at [facility] location

## 2022-01-25 ENCOUNTER — Other Ambulatory Visit: Payer: Self-pay | Admitting: Cardiovascular Disease

## 2022-01-25 ENCOUNTER — Other Ambulatory Visit: Payer: Self-pay | Admitting: Primary Care

## 2022-02-01 ENCOUNTER — Ambulatory Visit
Admission: RE | Admit: 2022-02-01 | Discharge: 2022-02-01 | Disposition: A | Discharge status: Home / Self Care | Payer: BC Managed Care – PPO | Source: Ambulatory Visit | Attending: Internal Medicine | Admitting: Internal Medicine

## 2022-02-01 DIAGNOSIS — R053 Chronic cough: Secondary | ICD-10-CM | POA: Diagnosis not present

## 2022-02-09 NOTE — Telephone Encounter (Signed)
For the [facility] team: Please see attached mychart note from patient. [facility] (supporting Martyn Ehrich, NP)14 minutes ago (3:14 PM)    I was seen on December 27th for a yearly check up and clearance for surgery. At that time a flutter machine, a new CPAP and and an oximeter was ordered. The flutter machine was ordered through [facility], 3 minutes from my house. The new CPAP and oximeter was ordered through [facility] more than an hour away from my home. It has been a two year battle to get [facility] to send the oximeter to see if I still need oxygen at night. Now they want me to drive up to meet with their respiratory therapy in [place] instead of just sending me a new CPAP. Please cancel these items with [employer] and order them through [facility] which is right around the corner from me. Please let me know when these orders have been placed. I am having major surgery the end of February and I would like this resolved as soon as possible. Thank you for your assistance, Brenda Craig  Please advise.

## 2022-02-09 NOTE — Telephone Encounter (Signed)
I have spoke with Brenda Craig and she is aware that I am sending the ONO order and the Replacment Cpap order to [facility]

## 2022-02-16 NOTE — Progress Notes (Signed)
Sent message, via epic in basket, requesting orders in epic from surgeon.  

## 2022-02-19 NOTE — Telephone Encounter (Signed)
Routing to triage to close [employer].

## 2022-02-21 ENCOUNTER — Ambulatory Visit: Payer: Self-pay | Admitting: Physician Assistant

## 2022-02-21 DIAGNOSIS — G8929 Other chronic pain: Secondary | ICD-10-CM

## 2022-02-21 NOTE — H&P (View-Only) (Signed)
TOTAL KNEE REVISION ADMISSION H&P  Patient is being admitted for left revision total knee arthroplasty.  Subjective:  Chief Complaint:left knee pain.  HPI: Brenda Craig, 62 y.o. female, has a history of pain and functional disability in the left knee(s) due to failed previous arthroplasty and patient has failed non-surgical conservative treatments for greater than 12 weeks to include NSAID's and/or analgesics, supervised PT with diminished ADL's post treatment, use of assistive devices, and activity modification. The indications for the revision of the total knee arthroplasty are loosening of one or more components. Onset of symptoms was gradual starting 2 years ago with gradually worsening course since that time.  Prior procedures on the left knee(s) include arthroplasty.  Patient currently rates pain in the left knee(s) at 8 out of 10 with activity. There is night pain, worsening of pain with activity and weight bearing, pain that interferes with activities of daily living, pain with passive range of motion, and joint swelling.  Patient has evidence of prosthetic loosening by imaging studies. This condition presents safety issues increasing the risk of falls.  There is no current active infection.  Patient Active Problem List   Diagnosis Date Noted  . Allergic contact dermatitis due to other agents 12/11/2021  . Low blood pressure reading 12/11/2021  . Moderate persistent asthma 12/11/2021  . Other allergic rhinitis 12/11/2021  . Iron deficiency 06/09/2021  . Severe persistent asthma 03/01/2021  . OSA (obstructive sleep apnea) 05/10/2020  . Chronic respiratory failure with hypoxia ([place]) 05/10/2020  . Pneumonia 05/10/2020  . Hx of respiratory failure 01/21/2020  . S/P total knee arthroplasty, left 01/19/2020  . AKI (acute kidney injury) ([place])   . Obstruction of left ureter   . Severe sepsis ([place]) 12/13/2018  . Septic shock ([place]) 12/13/2018  . OA (osteoarthritis) of knee 05/21/2016   . Atypical chest pain 12/20/2015  . Diarrhea   . Chronic cough 03/28/2015  . Colon polyp 03/28/2015  . DU (duodenal ulcer) 03/28/2015  . Hemorrhoid 03/28/2015  . 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 Calculus of kidney 03/28/2015  . Kidney cysts 03/28/2015  . Displacement of lumbar intervertebral disc without myelopathy 02/14/2015  . Angina pectoris ([place])   . CAD in native artery   . Back pain   . S/P coronary artery stent placement   . Hyperlipidemia   . Encounter for preprocedural cardiovascular examination 05/30/2014  . Excess weight 08/11/2013  . Celiac disease 07/22/2013  . Transient blindness of right eye 05/25/2013  . Acid reflux 01/17/2013  . Airway hyperreactivity 01/17/2013  . Anxiety 01/17/2013  . Arteriosclerosis of coronary artery 01/17/2013  . HLD (hyperlipidemia) 01/17/2013  . BP (high blood pressure) 01/17/2013  . Atherosclerosis of coronary artery 10/31/2012  . Essential hypertension 10/31/2012  . Hypercholesterolemia 10/31/2012  . Presence of stent in anterior descending branch of left coronary artery 10/31/2012  . Presence of stent in coronary artery 10/31/2012   Past Medical History:  Diagnosis Date  . Abnormal Pap smear of cervix 1988  . Acid reflux 01/17/2013  . Allergic rhinitis   . Anemia   . Anginal pain ([place])   . Anxiety   . Arthritis   . Asthma   . Celiac disease   . Cervical dysplasia 1988  . CHF (congestive heart failure) ([place])   . Complication of anesthesia    bp drops after anesthesia and has to be kept overnight  . Coronary artery disease   . Headache    migraine with  visual aura  . Heart trouble   . History of cardiac arrest   . History of hiatal hernia   . History of kidney stones   . History of mammogram 03/25/2013; 12/07/14   BIRADS 1; NEG  . History of Papanicolaou smear of cervix 03/20/12; 12/28/15   -/-; -/-  . Hypercholesteremia   . Hypertension   . IBS (irritable bowel syndrome)   .  Menopausal symptoms   . MI (myocardial infarction) ([place])   . Mitral valve prolapse   . Pneumonia   . PONV (postoperative nausea and vomiting)    nausea only  . Sleep apnea   . Vulvovaginitis    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    Past Surgical History:  Procedure Laterality Date  . ABLATION  2013   [facility]  . BACK SURGERY    . BACK SURGERY  2016; 2017   L4, L5  . CARDIAC SURGERY  03/19/12; 10/2014   HEART CATH AND STENT  . [place]; 1989  . COLD KNIFE CONE BIOPSY  1988  . COLONOSCOPY  08/2010   16 POLYPS (ALL BENIGN) DX C CELIAC DISEASE  . COLONOSCOPY N/A 02/17/2021   Procedure: COLONOSCOPY;  Surgeon: [name], MD;  Location: [facility] END[facility]COPY;  Service: Endoscopy;  Laterality: N/A;  . COMBINED HYSTEROSCOPY DIAGNOSTIC / D&C  2013   [facility]  . CYSTOSCOPY W/ RETROGRADES Left 01/20/2019   Procedure: CYSTOSCOPY WITH RETROGRADE PYELOGRAM;  Surgeon: Abbie Sons, MD;  Location: [facility] ORS;  Service: Urology;  Laterality: Left;  . CYSTOSCOPY W/ URETERAL STENT PLACEMENT Left 12/13/2018   Procedure: CYSTOSCOPY WITH RETROGRADE PYELOGRAM/URETERAL STENT PLACEMENT;  Surgeon: Irine Seal, MD;  Location: [facility] ORS;  Service: Urology;  Laterality: Left;  . CYSTOSCOPY/URETEROSCOPY/HOLMIUM LASER/STENT PLACEMENT Left 01/20/2019   Procedure: CYSTOSCOPY/URETEROSCOPY/HOLMIUM LASER/STENT EXCHANGE;  Surgeon: Abbie Sons, MD;  Location: [facility] ORS;  Service: Urology;  Laterality: Left;  . DILATION AND CURETTAGE OF UTERUS  2001  . ESOPHAGOGASTRODUODENOSCOPY  08/2010   DX C CELIAC DISEASE  . ESOPHAGOGASTRODUODENOSCOPY N/A 02/17/2021   Procedure: ESOPHAGOGASTRODUODENOSCOPY (EGD);  Surgeon: [name], MD;  Location: [facility] ENDOSCOPY;  Service: Endoscopy;  Laterality: N/A;  . HEART STENTS  05/27/2011  . HIP SURGERY  2016   RIGHT IT BAND AND BURSA REMOVAL  . OOPHORECTOMY Right 1982  . TOTAL KNEE ARTHROPLASTY Left 01/19/2020   Procedure: TOTAL KNEE  ARTHROPLASTY;  Surgeon: Renette Butters, MD;  Location: [facility] ORS;  Service: Orthopedics;  Laterality: Left;    Current Outpatient Medications  Medication Sig Dispense Refill Last Dose  . albuterol (PROVENTIL) (2.5 MG/3ML) 0.083% nebulizer solution Take 3 mLs (2.5 mg total) by nebulization every 4 (four) hours as needed for wheezing or shortness of breath (coughing fits). 75 mL 1   . albuterol (VENTOLIN HFA) 108 (90 Base) MCG/ACT inhaler Inhale 1-2 puffs into the lungs every 4 (four) hours as needed for wheezing or shortness of breath.     Marland Kitchen azelastine (ASTELIN) 0.1 % nasal spray Place 2 sprays into both nostrils 2 (two) times daily. Use in each nostril as directed 30 mL 1   . benzonatate (TESSALON) 200 MG capsule Take 200 mg by mouth as needed.     . budesonide-formoterol (SYMBICORT) 80-4.5 MCG/ACT inhaler Inhale 2 puffs into the lungs in the morning and at bedtime. with spacer and rinse mouth afterwards. 1 each 5   . chlorpheniramine-HYDROcodone 10-8 MG/5ML SMARTSIG:5 Milliliter(s) By Mouth Every 12  Hours     . clotrimazole-betamethasone (LOTRISONE) cream APPLY TO AFFECTED AREA TWICE A DAY AS NEEDED FOR SYMPTOMS FOR UP TO 2 WEEKS 45 g 1   . [facility] ASPIRIN ADULT LOW DOSE 81 MG chewable tablet Chew 81 mg by mouth daily.     . diphenhydrAMINE (BENADRYL) 50 MG tablet Take 0.5-1 tablets (25-50 mg total) by mouth every 6 (six) hours as needed for itching (Take with oxycodone). 60 tablet 0   . doxycycline (VIBRA-TABS) 100 MG tablet Take 1 tablet (100 mg total) by mouth 2 (two) times daily. (Patient not taking: Reported on 01/10/2022) 14 tablet 0   . ergocalciferol (VITAMIN D2) 1.25 MG (50000 UT) capsule Take 1 capsule by mouth once a week.     . famotidine (PEPCID) 40 MG tablet Take 40 mg by mouth at bedtime.     . fluticasone (FLONASE) 50 MCG/ACT nasal spray Place 2 sprays into both nostrils daily. 16 g 1   . LORazepam (ATIVAN) 1 MG tablet Take 1 mg by mouth 2 (two) times daily.     Marland Kitchen losartan (COZAAR) 25 MG  tablet TAKE 1 TABLET (25 MG TOTAL) BY MOUTH DAILY. 90 tablet 2   . montelukast (SINGULAIR) 10 MG tablet SMARTSIG:1 Tablet(s) By Mouth Every Evening     . nebivolol (BYSTOLIC) 5 MG tablet Take 1 tablet (5 mg total) by mouth daily with supper. 90 tablet 3   . NITROSTAT 0.4 MG SL tablet Place 0.4 mg under the tongue every 5 (five) minutes as needed for chest pain.     . pantoprazole (PROTONIX) 20 MG tablet Take 20 mg by mouth 2 (two) times daily.     . rosuvastatin (CRESTOR) 10 MG tablet Take 10 mg by mouth at bedtime.      No current facility-administered medications for this visit.   Allergies  Allergen Reactions  . Other     Gluten allergy  . Codeine Nausea And Vomiting and Rash  . Esomeprazole Magnesium Palpitations and Other (See Comments)    IRREGULAR HEARTBEAT  . Iodinated Contrast Media Itching  . Omeprazole Magnesium Palpitations and Other (See Comments)    IRREGULAR HEARTBEAT  . Oxycodone-Acetaminophen Itching, Nausea And Vomiting and Rash    Can take with benadryl  . Percocet [Oxycodone-Acetaminophen] Itching, Nausea And Vomiting and Rash    Can take with benadryl   . Tramadol Rash    Can take with benadryl     Social History   Tobacco Use  . Smoking status: Never    Passive exposure: Never  . Smokeless tobacco: Never  Substance Use Topics  . Alcohol use: Yes    Alcohol/week: 1.0 standard drink of alcohol    Types: 1 Standard drinks or equivalent per week    Family History  Problem Relation Age of Onset  . CAD Father   . Transient ischemic attack Father   . Diabetes Father   . Cerebrovascular Accident Father   . Heart disease Father        M-MVP; F BETA;BLOCKERS  . Hypothyroidism Father   . Hyperthyroidism Mother   . Cancer Maternal Uncle        KIDNEY  . Uterine cancer Maternal Aunt 69      Review of Systems  Cardiovascular:  Positive for chest pain, palpitations and leg swelling.  Gastrointestinal:  Positive for constipation, diarrhea, nausea and  vomiting.  Genitourinary:  Positive for dysuria.  Musculoskeletal:  Positive for arthralgias.  Neurological:  Positive for headaches.  All other  systems reviewed and are negative.   Objective:  Physical Exam Constitutional:      General: She is not in acute distress.    Appearance: Normal appearance.  HENT:     Head: Normocephalic and atraumatic.  Eyes:     Extraocular Movements: Extraocular movements intact.     Pupils: Pupils are equal, round, and reactive to light.  Cardiovascular:     Rate and Rhythm: Normal rate and regular rhythm.     Pulses: Normal pulses.     Heart sounds: Normal heart sounds.  Pulmonary:     Effort: Pulmonary effort is normal. No respiratory distress.     Breath sounds: Normal breath sounds. No wheezing.  Abdominal:     General: Abdomen is flat. Bowel sounds are normal. There is no distension.     Palpations: Abdomen is soft.     Tenderness: There is no abdominal tenderness.  Musculoskeletal:     Cervical back: Normal range of motion and neck supple.     Comments: Examination of the left lower extremity again shows she is neurovascularly intact.  She has a well healed surgical incision over the anterior knee.  No surrounding erythema or warmth.  She has good range of motion with flexion and extension.  Stable to varus and valgus.  She does have diffuse joint line tenderness.    Lymphadenopathy:     Cervical: No cervical adenopathy.  Skin:    General: Skin is warm and dry.     Findings: No erythema or rash.  Neurological:     General: No focal deficit present.     Mental Status: She is alert and oriented to person, place, and time.  Psychiatric:        Mood and Affect: Mood normal.        Behavior: Behavior normal.   Vital signs in last 24 hours: '@VSRANGES'$ @  Labs:  Estimated body mass index is 38.69 kg/m as calculated from the following:   Height as of 01/03/22: '5\' 4"'$  (1.626 m).   Weight as of 01/03/22: 102.2 kg.  Imaging Review Plain  radiographs demonstrate  s/p L TKA  degenerative joint disease of the left knee(s). The overall alignment is neutral.There is evidence of loosening of the femoral and tibial components. The bone quality appears to be good for age and reported activity level.     Assessment/Plan:  End stage arthritis, left knee(s) with failed previous arthroplasty.   The patient history, physical examination, clinical judgment of the provider and imaging studies are consistent with end stage degenerative joint disease of the left knee(s), previous total knee arthroplasty. Revision total knee arthroplasty is deemed medically necessary. The treatment options including medical management, injection therapy, arthroscopy and revision arthroplasty were discussed at length. The risks and benefits of revision total knee arthroplasty were presented and reviewed. The risks due to aseptic loosening, infection, stiffness, patella tracking problems, thromboembolic complications and other imponderables were discussed. The patient acknowledged the explanation, agreed to proceed with the plan and consent was signed. Patient is being admitted for inpatient treatment for surgery, pain control, PT, OT, prophylactic antibiotics, VTE prophylaxis, progressive ambulation and ADL's and discharge planning.The patient is planning to be discharged home with home health services

## 2022-02-21 NOTE — H&P (Signed)
TOTAL KNEE REVISION ADMISSION H&P  Patient is being admitted for left revision total knee arthroplasty.  Subjective:  Chief Complaint:left knee pain.  HPI: Brenda Craig, 62 y.o. female, has a history of pain and functional disability in the left knee(s) due to failed previous arthroplasty and patient has failed non-surgical conservative treatments for greater than 12 weeks to include NSAID's and/or analgesics, supervised PT with diminished ADL's post treatment, use of assistive devices, and activity modification. The indications for the revision of the total knee arthroplasty are loosening of one or more components. Onset of symptoms was gradual starting 2 years ago with gradually worsening course since that time.  Prior procedures on the left knee(s) include arthroplasty.  Patient currently rates pain in the left knee(s) at 8 out of 10 with activity. There is night pain, worsening of pain with activity and weight bearing, pain that interferes with activities of daily living, pain with passive range of motion, and joint swelling.  Patient has evidence of prosthetic loosening by imaging studies. This condition presents safety issues increasing the risk of falls.  There is no current active infection.  Patient Active Problem List   Diagnosis Date Noted  . Allergic contact dermatitis due to other agents 12/11/2021  . Low blood pressure reading 12/11/2021  . Moderate persistent asthma 12/11/2021  . Other allergic rhinitis 12/11/2021  . Iron deficiency 06/09/2021  . Severe persistent asthma 03/01/2021  . OSA (obstructive sleep apnea) 05/10/2020  . Chronic respiratory failure with hypoxia ([place]) 05/10/2020  . Pneumonia 05/10/2020  . Hx of respiratory failure 01/21/2020  . S/P total knee arthroplasty, left 01/19/2020  . AKI (acute kidney injury) ([place])   . Obstruction of left ureter   . Severe sepsis ([place]) 12/13/2018  . Septic shock ([place]) 12/13/2018  . OA (osteoarthritis) of knee 05/21/2016   . Atypical chest pain 12/20/2015  . Diarrhea   . Chronic cough 03/28/2015  . Colon polyp 03/28/2015  . DU (duodenal ulcer) 03/28/2015  . Hemorrhoid 03/28/2015  . 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 Calculus of kidney 03/28/2015  . Kidney cysts 03/28/2015  . Displacement of lumbar intervertebral disc without myelopathy 02/14/2015  . Angina pectoris ([place])   . CAD in native artery   . Back pain   . S/P coronary artery stent placement   . Hyperlipidemia   . Encounter for preprocedural cardiovascular examination 05/30/2014  . Excess weight 08/11/2013  . Celiac disease 07/22/2013  . Transient blindness of right eye 05/25/2013  . Acid reflux 01/17/2013  . Airway hyperreactivity 01/17/2013  . Anxiety 01/17/2013  . Arteriosclerosis of coronary artery 01/17/2013  . HLD (hyperlipidemia) 01/17/2013  . BP (high blood pressure) 01/17/2013  . Atherosclerosis of coronary artery 10/31/2012  . Essential hypertension 10/31/2012  . Hypercholesterolemia 10/31/2012  . Presence of stent in anterior descending branch of left coronary artery 10/31/2012  . Presence of stent in coronary artery 10/31/2012   Past Medical History:  Diagnosis Date  . Abnormal Pap smear of cervix 1988  . Acid reflux 01/17/2013  . Allergic rhinitis   . Anemia   . Anginal pain ([place])   . Anxiety   . Arthritis   . Asthma   . Celiac disease   . Cervical dysplasia 1988  . CHF (congestive heart failure) ([place])   . Complication of anesthesia    bp drops after anesthesia and has to be kept overnight  . Coronary artery disease   . Headache    migraine with  visual aura  . Heart trouble   . History of cardiac arrest   . History of hiatal hernia   . History of kidney stones   . History of mammogram 03/25/2013; 12/07/14   BIRADS 1; NEG  . History of Papanicolaou smear of cervix 03/20/12; 12/28/15   -/-; -/-  . Hypercholesteremia   . Hypertension   . IBS (irritable bowel syndrome)   .  Menopausal symptoms   . MI (myocardial infarction) ([place])   . Mitral valve prolapse   . Pneumonia   . PONV (postoperative nausea and vomiting)    nausea only  . Sleep apnea   . Vulvovaginitis    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    Past Surgical History:  Procedure Laterality Date  . ABLATION  2013   [facility]  . BACK SURGERY    . BACK SURGERY  2016; 2017   L4, L5  . CARDIAC SURGERY  03/19/12; 10/2014   HEART CATH AND STENT  . [place]; 1989  . COLD KNIFE CONE BIOPSY  1988  . COLONOSCOPY  08/2010   16 POLYPS (ALL BENIGN) DX C CELIAC DISEASE  . COLONOSCOPY N/A 02/17/2021   Procedure: COLONOSCOPY;  Surgeon: [name], MD;  Location: [facility] ENDOSCOPY;  Service: Endoscopy;  Laterality: N/A;  . COMBINED HYSTEROSCOPY DIAGNOSTIC / D&C  2013   [facility]  . CYSTOSCOPY W/ RETROGRADES Left 01/20/2019   Procedure: CYSTOSCOPY WITH RETROGRADE PYELOGRAM;  Surgeon: Abbie Sons, MD;  Location: [facility] ORS;  Service: Urology;  Laterality: Left;  . CYSTOSCOPY W/ URETERAL STENT PLACEMENT Left 12/13/2018   Procedure: CYSTOSCOPY WITH RETROGRADE PYELOGRAM/URETERAL STENT PLACEMENT;  Surgeon: Irine Seal, MD;  Location: [facility] ORS;  Service: Urology;  Laterality: Left;  . CYSTOSCOPY/URETEROSCOPY/HOLMIUM LASER/STENT PLACEMENT Left 01/20/2019   Procedure: CYSTOSCOPY/URETEROSCOPY/HOLMIUM LASER/STENT EXCHANGE;  Surgeon: Abbie Sons, MD;  Location: [facility] ORS;  Service: Urology;  Laterality: Left;  . DILATION AND CURETTAGE OF UTERUS  2001  . ESOPHAGOGASTRODUODENOSCOPY  08/2010   DX C CELIAC DISEASE  . ESOPHAGOGASTRODUODENOSCOPY N/A 02/17/2021   Procedure: ESOPHAGOGASTRODUODENOSCOPY (EGD);  Surgeon: [name], MD;  Location: [facility] ENDOSCOPY;  Service: Endoscopy;  Laterality: N/A;  . HEART STENTS  05/27/2011  . HIP SURGERY  2016   RIGHT IT BAND AND BURSA REMOVAL  . OOPHORECTOMY Right 1982  . TOTAL KNEE ARTHROPLASTY Left 01/19/2020   Procedure: TOTAL KNEE  ARTHROPLASTY;  Surgeon: Renette Butters, MD;  Location: [facility] ORS;  Service: Orthopedics;  Laterality: Left;    Current Outpatient Medications  Medication Sig Dispense Refill Last Dose  . albuterol (PROVENTIL) (2.5 MG/3ML) 0.083% nebulizer solution Take 3 mLs (2.5 mg total) by nebulization every 4 (four) hours as needed for wheezing or shortness of breath (coughing fits). 75 mL 1   . albuterol (VENTOLIN HFA) 108 (90 Base) MCG/ACT inhaler Inhale 1-2 puffs into the lungs every 4 (four) hours as needed for wheezing or shortness of breath.     Marland Kitchen azelastine (ASTELIN) 0.1 % nasal spray Place 2 sprays into both nostrils 2 (two) times daily. Use in each nostril as directed 30 mL 1   . benzonatate (TESSALON) 200 MG capsule Take 200 mg by mouth as needed.     . budesonide-formoterol (SYMBICORT) 80-4.5 MCG/ACT inhaler Inhale 2 puffs into the lungs in the morning and at bedtime. with spacer and rinse mouth afterwards. 1 each 5   . chlorpheniramine-HYDROcodone 10-8 MG/5ML SMARTSIG:5 Milliliter(s) By Mouth Every 12  Hours     . clotrimazole-betamethasone (LOTRISONE) cream APPLY TO AFFECTED AREA TWICE A DAY AS NEEDED FOR SYMPTOMS FOR UP TO 2 WEEKS 45 g 1   . [facility] ASPIRIN ADULT LOW DOSE 81 MG chewable tablet Chew 81 mg by mouth daily.     . diphenhydrAMINE (BENADRYL) 50 MG tablet Take 0.5-1 tablets (25-50 mg total) by mouth every 6 (six) hours as needed for itching (Take with oxycodone). 60 tablet 0   . doxycycline (VIBRA-TABS) 100 MG tablet Take 1 tablet (100 mg total) by mouth 2 (two) times daily. (Patient not taking: Reported on 01/10/2022) 14 tablet 0   . ergocalciferol (VITAMIN D2) 1.25 MG (50000 UT) capsule Take 1 capsule by mouth once a week.     . famotidine (PEPCID) 40 MG tablet Take 40 mg by mouth at bedtime.     . fluticasone (FLONASE) 50 MCG/ACT nasal spray Place 2 sprays into both nostrils daily. 16 g 1   . LORazepam (ATIVAN) 1 MG tablet Take 1 mg by mouth 2 (two) times daily.     Marland Kitchen losartan (COZAAR) 25 MG  tablet TAKE 1 TABLET (25 MG TOTAL) BY MOUTH DAILY. 90 tablet 2   . montelukast (SINGULAIR) 10 MG tablet SMARTSIG:1 Tablet(s) By Mouth Every Evening     . nebivolol (BYSTOLIC) 5 MG tablet Take 1 tablet (5 mg total) by mouth daily with supper. 90 tablet 3   . NITROSTAT 0.4 MG SL tablet Place 0.4 mg under the tongue every 5 (five) minutes as needed for chest pain.     . pantoprazole (PROTONIX) 20 MG tablet Take 20 mg by mouth 2 (two) times daily.     . rosuvastatin (CRESTOR) 10 MG tablet Take 10 mg by mouth at bedtime.      No current facility-administered medications for this visit.   Allergies  Allergen Reactions  . Other     Gluten allergy  . Codeine Nausea And Vomiting and Rash  . Esomeprazole Magnesium Palpitations and Other (See Comments)    IRREGULAR HEARTBEAT  . Iodinated Contrast Media Itching  . Omeprazole Magnesium Palpitations and Other (See Comments)    IRREGULAR HEARTBEAT  . Oxycodone-Acetaminophen Itching, Nausea And Vomiting and Rash    Can take with benadryl  . Percocet [Oxycodone-Acetaminophen] Itching, Nausea And Vomiting and Rash    Can take with benadryl   . Tramadol Rash    Can take with benadryl     Social History   Tobacco Use  . Smoking status: Never    Passive exposure: Never  . Smokeless tobacco: Never  Substance Use Topics  . Alcohol use: Yes    Alcohol/week: 1.0 standard drink of alcohol    Types: 1 Standard drinks or equivalent per week    Family History  Problem Relation Age of Onset  . CAD Father   . Transient ischemic attack Father   . Diabetes Father   . Cerebrovascular Accident Father   . Heart disease Father        M-MVP; F BETA;BLOCKERS  . Hypothyroidism Father   . Hyperthyroidism Mother   . Cancer Maternal Uncle        KIDNEY  . Uterine cancer Maternal Aunt 69      Review of Systems  Cardiovascular:  Positive for chest pain, palpitations and leg swelling.  Gastrointestinal:  Positive for constipation, diarrhea, nausea and  vomiting.  Genitourinary:  Positive for dysuria.  Musculoskeletal:  Positive for arthralgias.  Neurological:  Positive for headaches.  All other  systems reviewed and are negative.   Objective:  Physical Exam Constitutional:      General: She is not in acute distress.    Appearance: Normal appearance.  HENT:     Head: Normocephalic and atraumatic.  Eyes:     Extraocular Movements: Extraocular movements intact.     Pupils: Pupils are equal, round, and reactive to light.  Cardiovascular:     Rate and Rhythm: Normal rate and regular rhythm.     Pulses: Normal pulses.     Heart sounds: Normal heart sounds.  Pulmonary:     Effort: Pulmonary effort is normal. No respiratory distress.     Breath sounds: Normal breath sounds. No wheezing.  Abdominal:     General: Abdomen is flat. Bowel sounds are normal. There is no distension.     Palpations: Abdomen is soft.     Tenderness: There is no abdominal tenderness.  Musculoskeletal:     Cervical back: Normal range of motion and neck supple.     Comments: Examination of the left lower extremity again shows she is neurovascularly intact.  She has a well healed surgical incision over the anterior knee.  No surrounding erythema or warmth.  She has good range of motion with flexion and extension.  Stable to varus and valgus.  She does have diffuse joint line tenderness.    Lymphadenopathy:     Cervical: No cervical adenopathy.  Skin:    General: Skin is warm and dry.     Findings: No erythema or rash.  Neurological:     General: No focal deficit present.     Mental Status: She is alert and oriented to person, place, and time.  Psychiatric:        Mood and Affect: Mood normal.        Behavior: Behavior normal.   Vital signs in last 24 hours: @VSRANGES$ @  Labs:  Estimated body mass index is 38.69 kg/m as calculated from the following:   Height as of 01/03/22: 5' 4"$  (1.626 m).   Weight as of 01/03/22: 102.2 kg.  Imaging Review Plain  radiographs demonstrate  s/p L TKA  degenerative joint disease of the left knee(s). The overall alignment is neutral.There is evidence of loosening of the femoral and tibial components. The bone quality appears to be good for age and reported activity level.     Assessment/Plan:  End stage arthritis, left knee(s) with failed previous arthroplasty.   The patient history, physical examination, clinical judgment of the provider and imaging studies are consistent with end stage degenerative joint disease of the left knee(s), previous total knee arthroplasty. Revision total knee arthroplasty is deemed medically necessary. The treatment options including medical management, injection therapy, arthroscopy and revision arthroplasty were discussed at length. The risks and benefits of revision total knee arthroplasty were presented and reviewed. The risks due to aseptic loosening, infection, stiffness, patella tracking problems, thromboembolic complications and other imponderables were discussed. The patient acknowledged the explanation, agreed to proceed with the plan and consent was signed. Patient is being admitted for inpatient treatment for surgery, pain control, PT, OT, prophylactic antibiotics, VTE prophylaxis, progressive ambulation and ADL's and discharge planning.The patient is planning to be discharged home with home health services

## 2022-02-22 ENCOUNTER — Encounter: Payer: Self-pay | Admitting: Oncology

## 2022-02-22 NOTE — Patient Instructions (Addendum)
SURGICAL WAITING ROOM VISITATION Patients having surgery or a procedure may have no more than 2 support people in the waiting area - these visitors may rotate in the visitor waiting room.   Due to an increase in RSV and influenza rates and associated hospitalizations, children ages 96 and under may not visit patients in [name]. If the patient needs to stay at the hospital during part of their recovery, the visitor guidelines for inpatient rooms apply.  PRE-OP VISITATION  Pre-op nurse will coordinate an appropriate time for 1 support person to accompany the patient in pre-op.  This support person may not rotate.  This visitor will be contacted when the time is appropriate for the visitor to come back in the pre-op area.  Please refer to the [facility] website for the visitor guidelines for Inpatients (after your surgery is over and you are in a regular room).  You are not required to quarantine at this time prior to your surgery. However, you must do this: Hand Hygiene often Do NOT share personal items Notify your provider if you are in close contact with someone who has COVID or you develop fever 100.4 or greater, new onset of sneezing, cough, sore throat, shortness of breath or body aches.  If you test positive for Covid or have been in contact with anyone that has tested positive in the last 10 days please notify you surgeon.    Your procedure is scheduled on:  Wednesday   March 07, 2022  Report to [facility] Main Entrance: [address] entrance where the [employer] is available.   Report to admitting at: 10:45  AM  +++++Call this number if you have any questions or problems the morning of surgery [phone]  Do not eat food after Midnight the night prior to your surgery/procedure.  After Midnight you may have the following liquids until  10:15  AM DAY OF SURGERY  Clear Liquid Diet Water Black Coffee (sugar ok, NO MILK/CREAM OR CREAMERS)  Tea (sugar ok,  NO MILK/CREAM OR CREAMERS) regular and decaf                             Plain Jell-O  with no fruit (NO RED)                                           Fruit ices (not with fruit pulp, NO RED)                                     Popsicles (NO RED)                                                                  Juice: apple, WHITE grape, WHITE cranberry Sports drinks like Gatorade or Powerade (NO RED)                   The day of surgery:  Drink ONE (1) Pre-Surgery Clear Ensure at  10:15 AM the morning of surgery. Drink in one sitting. Do  not sip.  This drink was given to you during your hospital pre-op appointment visit. Nothing else to drink after completing the Pre-Surgery Clear Ensure : No candy, chewing gum or throat lozenges.    FOLLOW ANY ADDITIONAL PRE OP INSTRUCTIONS YOU RECEIVED FROM YOUR SURGEON'S OFFICE!!!   Oral Hygiene is also important to reduce your risk of infection.        Remember - BRUSH YOUR TEETH THE MORNING OF SURGERY WITH YOUR REGULAR TOOTHPASTE  Do NOT smoke after Midnight the night before surgery.  Take ONLY these medicines the morning of surgery with A SIP OF WATER: Pantoprazole (Protonix), Lorazepam (Ativan). You may uses your nebulizer and inhalers as needed. You may use your Flonase and Astelin nasal sprays if needed.   If You have been diagnosed with Sleep Apnea - Bring CPAP mask and tubing day of surgery. We will provide you with a CPAP machine on the day of your surgery.                   You may not have any metal on your body including hair pins, jewelry, and body piercing  Do not wear make-up, lotions, powders, perfumes or deodorant  Do not wear nail polish including gel and S&S, artificial / acrylic nails, or any other type of covering on natural nails including finger and toenails. If you have artificial nails, gel coating, etc., that needs to be removed by a nail salon, Please have this removed prior to surgery. Not doing so may mean that your  surgery could be cancelled or delayed if the Surgeon or anesthesia staff feels like they are unable to monitor you safely.   Do not shave 48 hours prior to surgery to avoid nicks in your skin which may contribute to postoperative infections.    Contacts, Hearing Aids, dentures or bridgework may not be worn into surgery.   You may bring a small overnight bag with you on the day of surgery, only pack items that are not valuable. [facility] IS NOT RESPONSIBLE   FOR VALUABLES THAT ARE LOST OR STOLEN.   Do not bring your home medications to the hospital EXCEPT YOUR ALBUTEROL INHALER.  The Pharmacy will dispense medications listed on your medication list to you during your admission in the Hospital.  Please read over the following fact sheets you were given: IF YOU HAVE QUESTIONS ABOUT YOUR PRE-OP INSTRUCTIONS, PLEASE CALL [phone]  ([place])   [place] - Preparing for Surgery Before surgery, you can play an important role.  Because skin is not sterile, your skin needs to be as free of germs as possible.  You can reduce the number of germs on your skin by washing with CHG (chlorahexidine gluconate) soap before surgery.  CHG is an antiseptic cleaner which kills germs and bonds with the skin to continue killing germs even after washing. Please DO NOT use if you have an allergy to CHG or antibacterial soaps.  If your skin becomes reddened/irritated stop using the CHG and inform your nurse when you arrive at Short Stay. Do not shave (including legs and underarms) for at least 48 hours prior to the first CHG shower.  You may shave your face/neck.  Please follow these instructions carefully:  1.  Shower with CHG Soap the night before surgery and the  morning of surgery.  2.  If you choose to wash your hair, wash your hair first as usual with your normal  shampoo.  3.  After you shampoo,  rinse your hair and body thoroughly to remove the shampoo.                             4.  Use CHG as you would any  other liquid soap.  You can apply chg directly to the skin and wash.  Gently with a scrungie or clean washcloth.  5.  Apply the CHG Soap to your body ONLY FROM THE NECK DOWN.   Do not use on face/ open                           Wound or open sores. Avoid contact with eyes, ears mouth and genitals (private parts).                       Wash face,  Genitals (private parts) with your normal soap.             6.  Wash thoroughly, paying special attention to the area where your  surgery  will be performed.  7.  Thoroughly rinse your body with warm water from the neck down.  8.  DO NOT shower/wash with your normal soap after using and rinsing off the CHG Soap.            9.  Pat yourself dry with a clean towel.            10.  Wear clean pajamas.            11.  Place clean sheets on your bed the night of your first shower and do not  sleep with pets.  ON THE DAY OF SURGERY : Do not apply any lotions/deodorants the morning of surgery.  Please wear clean clothes to the hospital/surgery center.    FAILURE TO FOLLOW THESE INSTRUCTIONS MAY RESULT IN THE CANCELLATION OF YOUR SURGERY  PATIENT SIGNATURE_________________________________  NURSE SIGNATURE__________________________________  ________________________________________________________________________        Brenda Craig    An incentive spirometer is a tool that can help keep your lungs clear and active. This tool measures how well you are filling your lungs with each breath. Taking long deep breaths may help reverse or decrease the chance of developing breathing (pulmonary) problems (especially infection) following: A long period of time when you are unable to move or be active. BEFORE THE PROCEDURE  If the spirometer includes an indicator to show your best effort, your nurse or respiratory therapist will set it to a desired goal. If possible, sit up straight or lean slightly forward. Try not to slouch. Hold the incentive  spirometer in an upright position. INSTRUCTIONS FOR USE  Sit on the edge of your bed if possible, or sit up as far as you can in bed or on a chair. Hold the incentive spirometer in an upright position. Breathe out normally. Place the mouthpiece in your mouth and seal your lips tightly around it. Breathe in slowly and as deeply as possible, raising the piston or the ball toward the top of the column. Hold your breath for 3-5 seconds or for as long as possible. Allow the piston or ball to fall to the bottom of the column. Remove the mouthpiece from your mouth and breathe out normally. Rest for a few seconds and repeat Steps 1 through 7 at least 10 times every 1-2 hours when you are awake. Take your time and take a few  normal breaths between deep breaths. The spirometer may include an indicator to show your best effort. Use the indicator as a goal to work toward during each repetition. After each set of 10 deep breaths, practice coughing to be sure your lungs are clear. If you have an incision (the cut made at the time of surgery), support your incision when coughing by placing a pillow or rolled up towels firmly against it. Once you are able to get out of bed, walk around indoors and cough well. You may stop using the incentive spirometer when instructed by your caregiver.  RISKS AND COMPLICATIONS Take your time so you do not get dizzy or light-headed. If you are in pain, you may need to take or ask for pain medication before doing incentive spirometry. It is harder to take a deep breath if you are having pain. AFTER USE Rest and breathe slowly and easily. It can be helpful to keep track of a log of your progress. Your caregiver can provide you with a simple table to help with this. If you are using the spirometer at home, follow these instructions: [place] IF:  You are having difficultly using the spirometer. You have trouble using the spirometer as often as instructed. Your pain  medication is not giving enough relief while using the spirometer. You develop fever of 100.5 F (38.1 C) or higher.                                                                                                    SEEK IMMEDIATE MEDICAL CARE IF:  You cough up bloody sputum that had not been present before. You develop fever of 102 F (38.9 C) or greater. You develop worsening pain at or near the incision site. MAKE SURE YOU:  Understand these instructions. Will watch your condition. Will get help right away if you are not doing well or get worse. Document Released: 05/07/2006 Document Revised: 03/19/2011 Document Reviewed: 07/08/2006 [facility] Patient Information 2014 [place], [place].       WHAT IS A BLOOD TRANSFUSION? Blood Transfusion Information  A transfusion is the replacement of blood or some of its parts. Blood is made up of multiple cells which provide different functions. Red blood cells carry oxygen and are used for blood loss replacement. White blood cells fight against infection. Platelets control bleeding. Plasma helps clot blood. Other blood products are available for specialized needs, such as hemophilia or other clotting disorders. BEFORE THE TRANSFUSION  Who gives blood for transfusions?  Healthy volunteers who are fully evaluated to make sure their blood is safe. This is blood bank blood. Transfusion therapy is the safest it has ever been in the practice of medicine. Before blood is taken from a donor, a complete history is taken to make sure that person has no history of diseases nor engages in risky social behavior (examples are intravenous drug use or sexual activity with multiple partners). The donor's travel history is screened to minimize risk of transmitting infections, such as malaria. The donated blood is tested for signs of infectious diseases, such as HIV  and hepatitis. The blood is then tested to be sure it is compatible with you in order to minimize the  chance of a transfusion reaction. If you or a relative donates blood, this is often done in anticipation of surgery and is not appropriate for emergency situations. It takes many days to process the donated blood. RISKS AND COMPLICATIONS Although transfusion therapy is very safe and saves many lives, the main dangers of transfusion include:  Getting an infectious disease. Developing a transfusion reaction. This is an allergic reaction to something in the blood you were given. Every precaution is taken to prevent this. The decision to have a blood transfusion has been considered carefully by your caregiver before blood is given. Blood is not given unless the benefits outweigh the risks. AFTER THE TRANSFUSION Right after receiving a blood transfusion, you will usually feel much better and more energetic. This is especially true if your red blood cells have gotten low (anemic). The transfusion raises the level of the red blood cells which carry oxygen, and this usually causes an energy increase. The nurse administering the transfusion will monitor you carefully for complications. HOME CARE INSTRUCTIONS  No special instructions are needed after a transfusion. You may find your energy is better. Speak with your caregiver about any limitations on activity for underlying diseases you may have. SEEK MEDICAL CARE IF:  Your condition is not improving after your transfusion. You develop redness or irritation at the intravenous (IV) site. SEEK IMMEDIATE MEDICAL CARE IF:  Any of the following symptoms occur over the next 12 hours: Shaking chills. You have a temperature by mouth above 102 F (38.9 C), not controlled by medicine. Chest, back, or muscle pain. People around you feel you are not acting correctly or are confused. Shortness of breath or difficulty breathing. Dizziness and fainting. You get a rash or develop hives. You have a decrease in urine output. Your urine turns a dark color or changes to  pink, red, or brown. Any of the following symptoms occur over the next 10 days: You have a temperature by mouth above 102 F (38.9 C), not controlled by medicine. Shortness of breath. Weakness after normal activity. The white part of the eye turns yellow (jaundice). You have a decrease in the amount of urine or are urinating less often. Your urine turns a dark color or changes to pink, red, or brown. Document Released: 12/23/1999 Document Revised: 03/19/2011 Document Reviewed: 08/11/2007 [facility] Patient Information 2014 [place], [place].  _______________________________________________________________________

## 2022-02-22 NOTE — Progress Notes (Addendum)
Patient is ALLERGIC to IODINE CONTRAST DYE but she states that she is NOT ALLERGIC TO BETADINE Topical.    COVID Vaccine received:  []$  No [x]$  Yes Date of any COVID positive Test in last 90 days:  None  PCP - Benita Stabile, MD Cardiologist - Ida Rogue, MD  Chest x-ray -  CT Chest 02-02-2022  epic EKG -  12-29-2021   epic Stress Test - 10-11-2021  epic ECHO - 2018  bubble study   epic Cardiac Cath - 03-28-2012  DES at [facility] [place].  Long term monitor- 11-26-2021  epic  PCR screen: [x]$  Ordered & Completed                      []$   No Order but Needs PROFEND                      []$   N/A for this surgery  Surgery Plan:  []$  Ambulatory                            []$  Outpatient in bed                            [x]$  Admit  Anesthesia:    []$  General  [x]$  Spinal                           []$   Choice []$   MAC  Pacemaker / ICD device [x]$  No []$  Yes        Device order form faxed [x]$  No    []$   Yes      Faxed to:  Spinal Cord Stimulator:[x]$  No []$  Yes      (Remind patient to bring remote DOS) Other Implants:   History of Sleep Apnea? []$  No [x]$  Yes   CPAP used?- []$  No [x]$  Yes   Does the patient monitor blood sugar? []$  No []$  Yes  [x]$  N/A  Blood Thinner / Instructions:  none Aspirin Instructions:ASA 81 mg   Will continue taking it per Dr. Stan Head instructions.  ERAS Protocol Ordered: []$  No  [x]$  Yes PRE-SURGERY [x]$  ENSURE  []$  G2  Patient states that she will not drink the ENSURE drink because it gives her severe diarrhea (Celiac Disease). She did not want to drink the G2 either, she's just going to drink water.  Patient is to be NPO after:  10:15 am  Comments: Patient has a chronic cough and has Bronchiectasis / COPD.  She has Celiac disease, and gets occasional Iron infusions based on blood work.  Activity level: Patient can not climb a flight of stairs without difficulty; [x]$  No CP  [x]$  No SOB, but would have severe leg pain_   Patient can perform ADLs without assistance.   Anesthesia  review: CAD, Hx MI-cardiac arrest in 2014, HTN, CHF, OSA (CPAP), PONV, asthma, Hx BP? after anesthesia- must be admitted overnight  Patient denies shortness of breath, fever, cough and chest pain at PAT appointment.  Patient verbalized understanding and agreement to the Pre-Surgical Instructions that were given to them at this PAT appointment. Patient was also educated of the need to review these PAT instructions again prior to his/her surgery.I reviewed the appropriate phone numbers to call if they have any and questions or concerns.

## 2022-02-23 ENCOUNTER — Other Ambulatory Visit: Payer: Self-pay

## 2022-02-23 ENCOUNTER — Encounter (HOSPITAL_COMMUNITY): Payer: Self-pay

## 2022-02-23 ENCOUNTER — Encounter (HOSPITAL_COMMUNITY)
Admission: RE | Admit: 2022-02-23 | Discharge: 2022-02-23 | Disposition: A | Discharge status: Home / Self Care | Payer: BC Managed Care – PPO | Source: Ambulatory Visit | Attending: Orthopedic Surgery | Admitting: Orthopedic Surgery

## 2022-02-23 VITALS — BP 126/78 | HR 72 | Temp 97.9°F | Resp 16 | Ht 64.0 in | Wt 226.0 lb

## 2022-02-23 DIAGNOSIS — G8929 Other chronic pain: Secondary | ICD-10-CM | POA: Diagnosis not present

## 2022-02-23 DIAGNOSIS — M25562 Pain in left knee: Secondary | ICD-10-CM | POA: Insufficient documentation

## 2022-02-23 DIAGNOSIS — Z01818 Encounter for other preprocedural examination: Secondary | ICD-10-CM

## 2022-02-23 DIAGNOSIS — Z01812 Encounter for preprocedural laboratory examination: Secondary | ICD-10-CM | POA: Diagnosis present

## 2022-02-23 HISTORY — DX: Bronchiectasis, uncomplicated: J47.9

## 2022-02-23 HISTORY — DX: Chronic kidney disease, unspecified: N18.9

## 2022-02-23 LAB — SURGICAL PCR SCREEN
MRSA, PCR: NEGATIVE
Staphylococcus aureus: POSITIVE — AB

## 2022-02-23 LAB — COMPREHENSIVE METABOLIC PANEL
ALT: 28 U/L (ref 0–44)
AST: 30 U/L (ref 15–41)
Albumin: 3.9 g/dL (ref 3.5–5.0)
Alkaline Phosphatase: 160 U/L — ABNORMAL HIGH (ref 38–126)
Anion gap: 8 (ref 5–15)
BUN: 13 mg/dL (ref 8–23)
CO2: 25 mmol/L (ref 22–32)
Calcium: 8.8 mg/dL — ABNORMAL LOW (ref 8.9–10.3)
Chloride: 105 mmol/L (ref 98–111)
Creatinine, Ser: 0.92 mg/dL (ref 0.44–1.00)
GFR, Estimated: >60 mL/min (ref >60)
Glucose, Bld: 106 mg/dL — ABNORMAL HIGH (ref 70–99)
Potassium: 4 mmol/L (ref 3.5–5.1)
Sodium: 138 mmol/L (ref 135–145)
Total Bilirubin: 0.5 mg/dL (ref 0.3–1.2)
Total Protein: 7.2 g/dL (ref 6.5–8.1)

## 2022-02-23 LAB — TYPE AND SCREEN
ABO/RH(D): O POS
Antibody Screen: NEGATIVE

## 2022-02-23 LAB — CBC WITH DIFFERENTIAL/PLATELET
Abs Immature Granulocytes: 0.02 10*3/uL (ref 0.00–0.07)
Basophils Absolute: 0 10*3/uL (ref 0.0–0.1)
Basophils Relative: 1 %
Eosinophils Absolute: 0.1 10*3/uL (ref 0.0–0.5)
Eosinophils Relative: 2 %
HCT: 42.9 % (ref 36.0–46.0)
Hemoglobin: 13.3 g/dL (ref 12.0–15.0)
Immature Granulocytes: 0 %
Lymphocytes Relative: 31 %
Lymphs Abs: 1.8 10*3/uL (ref 0.7–4.0)
MCH: 28.8 pg (ref 26.0–34.0)
MCHC: 31 g/dL (ref 30.0–36.0)
MCV: 92.9 fL (ref 80.0–100.0)
Monocytes Absolute: 0.8 10*3/uL (ref 0.1–1.0)
Monocytes Relative: 13 %
Neutro Abs: 3.1 10*3/uL (ref 1.7–7.7)
Neutrophils Relative %: 53 %
Platelets: 237 10*3/uL (ref 150–400)
RBC: 4.62 MIL/uL (ref 3.87–5.11)
RDW: 13.6 % (ref 11.5–15.5)
WBC: 5.8 10*3/uL (ref 4.0–10.5)
nRBC: 0 % (ref 0.0–0.2)

## 2022-02-23 NOTE — Progress Notes (Signed)
Patient's PCR screen is positive for STAPH. Appropriate notes have been placed on the patient's chart. This note has been routed to Dr. Zachery Dakins for review. The Patient's surgery is currently scheduled for:   03-07-22 at [facility].  Brenda Craig, BSN, CVRN-BC   Pre-Surgical Testing Nurse [place]  [phone]

## 2022-02-26 NOTE — Progress Notes (Signed)
Please let patient know CT chest was reassuring, she has scarring lung bases- not significantly changed or progressed compared to prior dating back to 2018. Minimal bronchiectasis. Mild air trapping which you can see with asthma. Incidental finding CAD, make sure she is taking her cholesterol medication as directed

## 2022-02-27 NOTE — Anesthesia Preprocedure Evaluation (Addendum)
Anesthesia Evaluation  Patient identified by MRN, date of birth, ID band Patient awake    Reviewed: Allergy & Precautions, NPO status , Patient's Chart, lab work & pertinent test results, reviewed documented beta blocker date and time   History of Anesthesia Complications (+) PONV and history of anesthetic complications (has required overnight stays due to low BP after anesthesia)  Airway Mallampati: III  TM Distance: >3 FB Neck ROM: Full    Dental  (+) Dental Advisory Given,    Pulmonary neg shortness of breath, asthma , sleep apnea (O2 at night), Continuous Positive Airway Pressure Ventilation and Oxygen sleep apnea , neg COPD, neg recent URI bronchiectasis   Pulmonary exam normal breath sounds clear to auscultation       Cardiovascular hypertension (losartan, nebivolol), Pt. on medications and Pt. on home beta blockers (-) angina (no nitroglycerin in 3 years) + CAD, + Past MI (STEMI complicated by ventricular fibrillation requiring circumflex coronary artery stent May 2013.), + Cardiac Stents (05/2011, 05/2012) and +CHF  (-) CABG + dysrhythmias Supra Ventricular Tachycardia + Valvular Problems/Murmurs MVP  Rhythm:Regular Rate:Normal  HLD, h/o cardiac arrest  Myocardial Perfusion Test 10/11/2021:   Findings are consistent with no ischemia. The study is low risk.   No ST deviation was noted.   The left ventricular ejection fraction is hyperdynamic (>65%).   LAD and LCx coronary calcifications/stent noted.  TTE 05/22/2016: Study Conclusions   - Left ventricle: The cavity size was normal. Wall thickness was at    the upper limits of normal. Systolic function was vigorous. The    estimated ejection fraction was in the range of 65% to 70%.    Doppler parameters are consistent with abnormal left ventricular    relaxation (grade 1 diastolic dysfunction).  - Right ventricle: The cavity size was normal. Systolic function    was  normal.  - Atrial septum: Agitated saline contrast study showed no    right-to-left atrial level shunt, in the baseline state.   [facility] 10/18/2014: Final results :  1.  Clinical : Recurrent chest pains etiology undetermined.  2.  Anatomic : Normal LAD and marginal circumflex stents.  Mild  progression of atherosclerosis about 10% at all focal previous stenosis.  Mid LAD has 50% stenosis.  Negative FFR for hemodynamic significance.   Other stenosis including 30% mid and distal right coronary artery and 30%  followed by 50% mid LAD stenosis.  Ejection fraction improved 55-60%.       Neuro/Psych  Headaches, neg Seizures PSYCHIATRIC DISORDERS Anxiety        GI/Hepatic Neg liver ROS, hiatal hernia,GERD  Medicated,,Celiac disease, IBS   Endo/Other  negative endocrine ROS    Renal/GU CRFRenal disease     Musculoskeletal  (+) Arthritis , Osteoarthritis,    Abdominal  (+) + obese  Peds  Hematology  (+) Blood dyscrasia, anemia   Anesthesia Other Findings   Reproductive/Obstetrics                             Anesthesia Physical Anesthesia Plan  ASA: 3  Anesthesia Plan: Spinal and MAC   Post-op Pain Management: Regional block* and Tylenol PO (pre-op)*   Induction: Intravenous  PONV Risk Score and Plan: 3 and Ondansetron, Dexamethasone, Propofol infusion and Treatment may vary due to age or medical condition  Airway Management Planned: Natural Airway and Simple Face Mask  Additional Equipment:   Intra-op Plan:   Post-operative Plan:   Informed  Consent: I have reviewed the patients History and Physical, chart, labs and discussed the procedure including the risks, benefits and alternatives for the proposed anesthesia with the patient or authorized representative who has indicated his/her understanding and acceptance.     Dental advisory given  Plan Discussed with: CRNA and Anesthesiologist  Anesthesia Plan Comments: (I have discussed risks  of neuraxial anesthesia including but not limited to infection, bleeding, nerve injury, back pain, headache, seizures, and failure of block. Patient denies bleeding disorders and is not currently anticoagulated. Labs have been reviewed. Risks and benefits discussed. All patient's questions answered.   Discussed with patient risks of MAC including, but not limited to, minor pain or discomfort, hearing people in the room, and possible need for backup general anesthesia. Risks for general anesthesia also discussed including, but not limited to, sore throat, hoarse voice, chipped/damaged teeth, injury to vocal cords, nausea and vomiting, allergic reactions, lung infection, heart attack, stroke, and death. All questions answered.  Discussed possible need for post-op intubation or respiratory support.  See PAT note 02/23/2022)       Anesthesia Quick Evaluation

## 2022-02-27 NOTE — Progress Notes (Signed)
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 Charlies Constable.   Per last pulmonology note 01/10/2022, "Patient is considered intermittent risk for prolonged mechanical ventilation and/or post op pulmonary complications d/t history sleep apnea and hyperreactive airway disease. She is optimized from pulmonary standpoint for knee surgery, clearance will be decided upon by orthopedic surgeon and anesthesiology.  Recommend surgery be done inpatient setting and patient stay overnight for observation if needed.    Recommend 1. Short duration of surgery as much as possible and avoid paralytic if possible 2. Recovery in step down or ICU with Pulmonary consultation if needed  3. DVT prophylaxis 4. Aggressive pulmonary toilet with o2, bronchodilatation, and incentive spirometry and early ambulation"  Pt last seen by cardiology 12/29/2021. Per OV note, "Acceptable risk for knee surgery, no further testing needed   Coronary disease with stable angina Currently with no symptoms of angina. No further workup at this time. Continue current medication regimen. Recent stress test October 2023, low risk study No further workup needed"  Anticipate pt can proceed with planned procedure barring acute status change.   VS: BP 126/78 Comment: right arm sitting  Pulse 72   Temp 36.6 C (Oral)   Resp 16   Ht 5' 4"$  (1.626 m)   Wt 102.5 kg   SpO2 99%   BMI 38.79 kg/m   PROVIDERS: Albina Billet, MD is PCP   Cardiologist - Ida Rogue, MD  LABS: Labs reviewed: Acceptable for surgery. (all labs ordered are listed, but only abnormal results are displayed)  Labs Reviewed  SURGICAL PCR SCREEN - Abnormal; Notable for the following components:      Result Value   Staphylococcus aureus POSITIVE (*)    All other components within normal limits  COMPREHENSIVE METABOLIC PANEL - Abnormal; Notable for the following components:   Glucose, Bld 106 (*)    Calcium 8.8 (*)    Alkaline Phosphatase 160 (*)    All other components within normal limits  CBC WITH DIFFERENTIAL/PLATELET  TYPE AND SCREEN     IMAGES:   EKG:   CV: Echo 05/22/2016 - Left ventricle: The cavity size was normal. Wall thickness was at    the upper limits of normal. Systolic function was vigorous. The    estimated ejection fraction was in the range of 65% to 70%.    Doppler parameters are consistent with abnormal left ventricular    relaxation (grade 1 diastolic dysfunction).  - Right ventricle: The cavity size was normal. Systolic function    was normal.  - Atrial septum: Agitated saline contrast study showed no    right-to-left atrial level shunt, in the baseline state.  Past Medical History:  Diagnosis Date   Abnormal Pap smear of cervix 1988   Acid reflux 01/17/2013   Allergic rhinitis    Anemia    Anginal pain (HCC)    Anxiety    Arthritis    Asthma    Bronchiectasis (HCC)    Celiac disease    Cervical dysplasia  1988   CHF (congestive heart failure) (HCC)    Chronic kidney disease    Complication of anesthesia    bp drops after anesthesia and has to be kept overnight   Coronary artery disease    Headache    migraine with visual aura   Heart trouble    History of cardiac arrest    History of hiatal hernia    History of kidney stones    History of mammogram 03/25/2013; 12/07/14   BIRADS 1; NEG   History of Papanicolaou smear of cervix 03/20/12; 12/28/15   -/-; -/-   Hypercholesteremia    Hypertension    IBS (irritable  bowel syndrome)    Menopausal symptoms    MI (myocardial infarction) ([place])    Mitral valve prolapse    Pneumonia    PONV (postoperative nausea and vomiting)    nausea only   Sleep apnea    Vulvovaginitis    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    Past Surgical History:  Procedure Laterality Date   ABLATION  2013   [facility]   BACK SURGERY     BACK SURGERY  2016; 2017   L4, L5   CARDIAC SURGERY  03/19/12; 10/2014   HEART CATH AND STENT   CESAREAN SECTION     1986; [place]  08/2010   16 POLYPS (ALL BENIGN) DX C CELIAC DISEASE   COLONOSCOPY N/A 02/17/2021   Procedure: COLONOSCOPY;  Surgeon: [name], MD;  Location: [facility] ENDOSCOPY;  Service: Endoscopy;  Laterality: N/A;   COMBINED HYSTEROSCOPY DIAGNOSTIC / D&C  2013   [facility]   CYSTOSCOPY W/ RETROGRADES Left 01/20/2019   Procedure: CYSTOSCOPY WITH RETROGRADE PYELOGRAM;  Surgeon: Abbie Sons, MD;  Location: [facility];  Service: Urology;  Laterality: Left;   CYSTOSCOPY W/ URETERAL STENT PLACEMENT Left 12/13/2018   Procedure: CYSTOSCOPY WITH RETROGRADE PYELOGRAM/URETERAL STENT PLACEMENT;  Surgeon: Irine Seal, MD;  Location: [facility] OR[facility];  Service: Urology;  Laterality: Left;   CYSTOSCOPY/URETEROSCOPY/HOLMIUM LASER/STENT PLACEMENT Left 01/20/2019   Procedure: CYSTOSCOPY/URETEROSCOPY/HOLMIUM LASER/STENT EXCHANGE;  Surgeon: Abbie Sons, MD;  Location: [facility];  Service: Urology;  Laterality: Left;   DILATION AND CURETTAGE OF UTERUS  2001   ESOPHAGOGASTRODUODENOSCOPY  08/2010   DX C CELIAC DISEASE   ESOPHAGOGASTRODUODENOSCOPY N/A 02/17/2021   Procedure: ESOPHAGOGASTRODUODENOSCOPY (EGD);  Surgeon: [name], MD;  Location[facility] END[facility];  Service: Endoscopy;  Laterality: N/A;   HEART STENTS  05/27/2011   HIP SURGERY  2016   RIGHT IT BAND AND BURSA REMOVAL   OOPHORECTOMY Right 1982   TOTAL KNEE ARTHROPLASTY Left 01/19/2020   Procedure: TOTAL KNEE ARTHROPLASTY;   Surgeon: Renette Butters, MD;  Location: [facility];  Service: Orthopedics;  Laterality: Left;    MEDICATIONS:  acetaminophen (TYLENOL) 500 MG tablet   albuterol (PROVENTIL) (2.5 MG/3ML) 0.083% nebulizer solution   albuterol (VENTOLIN HFA) 108 (90 Base) MCG/ACT inhaler   amoxicillin (AMOXIL) 500 MG capsule   azelastine (ASTELIN) 0.1 % nasal spray   budesonide-formoterol (SYMBICORT) 80-4.5 MCG/ACT inhaler   clotrimazole-betamethasone (LOTRISONE) cream   [employer] ASPIRIN ADULT LOW DOSE 81 MG chewable tablet   dicyclomine (BENTYL) 10 MG capsule   diphenhydrAMINE (BENADRYL ALLERGY) 25 MG tablet   famotidine (PEPCID) 40 MG tablet   fluticasone (FLONASE) 50 MCG/ACT nasal spray   LORazepam (ATIVAN) 1 MG tablet   losartan (COZAAR) 25 MG tablet   Melatonin 5 MG CAPS  montelukast (SINGULAIR) 10 MG tablet   nebivolol (BYSTOLIC) 5 MG tablet   NITROSTAT 0.4 MG SL tablet   nystatin cream (MYCOSTATIN)   pantoprazole (PROTONIX) 20 MG tablet   Phenylephrine-DM-GG (TUSSIDEX PO)   rosuvastatin (CRESTOR) 10 MG tablet   triamcinolone cream (KENALOG) 0.1 %   No current facility-administered medications for this encounter.     Konrad Felix Ward, PA-C [facility] [phone]

## 2022-03-07 ENCOUNTER — Other Ambulatory Visit: Payer: Self-pay

## 2022-03-07 ENCOUNTER — Inpatient Hospital Stay (HOSPITAL_COMMUNITY)
Admission: RE | Admit: 2022-03-07 | Discharge: 2022-03-10 | DRG: 468 | Disposition: A | Discharge status: Home / Self Care | Payer: BC Managed Care – PPO | Attending: Orthopedic Surgery | Admitting: Orthopedic Surgery

## 2022-03-07 ENCOUNTER — Inpatient Hospital Stay (HOSPITAL_COMMUNITY): Payer: BC Managed Care – PPO

## 2022-03-07 ENCOUNTER — Inpatient Hospital Stay (HOSPITAL_COMMUNITY): Payer: BC Managed Care – PPO | Admitting: Anesthesiology

## 2022-03-07 ENCOUNTER — Encounter (HOSPITAL_COMMUNITY): Payer: Self-pay | Admitting: Orthopedic Surgery

## 2022-03-07 ENCOUNTER — Inpatient Hospital Stay (HOSPITAL_COMMUNITY): Payer: BC Managed Care – PPO | Admitting: Physician Assistant

## 2022-03-07 ENCOUNTER — Encounter (HOSPITAL_COMMUNITY)
Admission: RE | Disposition: A | Discharge status: Home / Self Care | Payer: Self-pay | Source: Home / Self Care | Attending: Orthopedic Surgery

## 2022-03-07 DIAGNOSIS — I252 Old myocardial infarction: Secondary | ICD-10-CM | POA: Diagnosis not present

## 2022-03-07 DIAGNOSIS — T84033A Mechanical loosening of internal left knee prosthetic joint, initial encounter: Secondary | ICD-10-CM | POA: Diagnosis present

## 2022-03-07 DIAGNOSIS — M1712 Unilateral primary osteoarthritis, left knee: Secondary | ICD-10-CM | POA: Diagnosis present

## 2022-03-07 DIAGNOSIS — I2510 Atherosclerotic heart disease of native coronary artery without angina pectoris: Secondary | ICD-10-CM | POA: Diagnosis present

## 2022-03-07 DIAGNOSIS — I509 Heart failure, unspecified: Secondary | ICD-10-CM | POA: Diagnosis present

## 2022-03-07 DIAGNOSIS — K219 Gastro-esophageal reflux disease without esophagitis: Secondary | ICD-10-CM | POA: Diagnosis present

## 2022-03-07 DIAGNOSIS — J4550 Severe persistent asthma, uncomplicated: Secondary | ICD-10-CM | POA: Diagnosis present

## 2022-03-07 DIAGNOSIS — J309 Allergic rhinitis, unspecified: Secondary | ICD-10-CM | POA: Diagnosis present

## 2022-03-07 DIAGNOSIS — Z8674 Personal history of sudden cardiac arrest: Secondary | ICD-10-CM | POA: Diagnosis not present

## 2022-03-07 DIAGNOSIS — Z886 Allergy status to analgesic agent status: Secondary | ICD-10-CM

## 2022-03-07 DIAGNOSIS — D509 Iron deficiency anemia, unspecified: Secondary | ICD-10-CM | POA: Diagnosis present

## 2022-03-07 DIAGNOSIS — Z79899 Other long term (current) drug therapy: Secondary | ICD-10-CM

## 2022-03-07 DIAGNOSIS — Z955 Presence of coronary angioplasty implant and graft: Secondary | ICD-10-CM | POA: Diagnosis not present

## 2022-03-07 DIAGNOSIS — Z9109 Other allergy status, other than to drugs and biological substances: Secondary | ICD-10-CM

## 2022-03-07 DIAGNOSIS — Z86010 Personal history of colonic polyps: Secondary | ICD-10-CM

## 2022-03-07 DIAGNOSIS — Z8711 Personal history of peptic ulcer disease: Secondary | ICD-10-CM | POA: Diagnosis not present

## 2022-03-07 DIAGNOSIS — K900 Celiac disease: Secondary | ICD-10-CM | POA: Diagnosis present

## 2022-03-07 DIAGNOSIS — E7800 Pure hypercholesterolemia, unspecified: Secondary | ICD-10-CM | POA: Diagnosis present

## 2022-03-07 DIAGNOSIS — I110 Hypertensive heart disease with heart failure: Secondary | ICD-10-CM | POA: Diagnosis present

## 2022-03-07 DIAGNOSIS — Z833 Family history of diabetes mellitus: Secondary | ICD-10-CM | POA: Diagnosis not present

## 2022-03-07 DIAGNOSIS — Z8049 Family history of malignant neoplasm of other genital organs: Secondary | ICD-10-CM | POA: Diagnosis not present

## 2022-03-07 DIAGNOSIS — Z885 Allergy status to narcotic agent status: Secondary | ICD-10-CM

## 2022-03-07 DIAGNOSIS — T84019A Broken internal joint prosthesis, unspecified site, initial encounter: Secondary | ICD-10-CM | POA: Diagnosis present

## 2022-03-07 DIAGNOSIS — E669 Obesity, unspecified: Secondary | ICD-10-CM | POA: Diagnosis present

## 2022-03-07 DIAGNOSIS — Z888 Allergy status to other drugs, medicaments and biological substances status: Secondary | ICD-10-CM

## 2022-03-07 DIAGNOSIS — Z91041 Radiographic dye allergy status: Secondary | ICD-10-CM

## 2022-03-07 DIAGNOSIS — K589 Irritable bowel syndrome without diarrhea: Secondary | ICD-10-CM | POA: Diagnosis present

## 2022-03-07 DIAGNOSIS — Z7951 Long term (current) use of inhaled steroids: Secondary | ICD-10-CM

## 2022-03-07 DIAGNOSIS — Z823 Family history of stroke: Secondary | ICD-10-CM

## 2022-03-07 DIAGNOSIS — Y828 Other medical devices associated with adverse incidents: Secondary | ICD-10-CM | POA: Diagnosis present

## 2022-03-07 DIAGNOSIS — Y792 Prosthetic and other implants, materials and accessory orthopedic devices associated with adverse incidents: Secondary | ICD-10-CM | POA: Diagnosis present

## 2022-03-07 DIAGNOSIS — G4733 Obstructive sleep apnea (adult) (pediatric): Secondary | ICD-10-CM | POA: Diagnosis present

## 2022-03-07 DIAGNOSIS — F419 Anxiety disorder, unspecified: Secondary | ICD-10-CM | POA: Diagnosis present

## 2022-03-07 DIAGNOSIS — Z6838 Body mass index (BMI) 38.0-38.9, adult: Secondary | ICD-10-CM | POA: Diagnosis not present

## 2022-03-07 DIAGNOSIS — Z8249 Family history of ischemic heart disease and other diseases of the circulatory system: Secondary | ICD-10-CM

## 2022-03-07 HISTORY — PX: TOTAL KNEE REVISION: SHX996

## 2022-03-07 LAB — ABO/RH: ABO/RH(D): O POS

## 2022-03-07 SURGERY — TOTAL KNEE REVISION
Anesthesia: Monitor Anesthesia Care | Site: Knee | Laterality: Left

## 2022-03-07 MED ORDER — ONDANSETRON HCL 4 MG/2ML IJ SOLN
4.0000 mg | Freq: Four times a day (QID) | INTRAMUSCULAR | Status: DC | PRN
  Administered 2022-03-07: 4 mg via INTRAVENOUS
  Filled 2022-03-07: qty 2

## 2022-03-07 MED ORDER — 0.9 % SODIUM CHLORIDE (POUR BTL) OPTIME
TOPICAL | Status: DC | PRN
  Administered 2022-03-07: 1000 mL

## 2022-03-07 MED ORDER — PANTOPRAZOLE SODIUM 40 MG PO TBEC
40.0000 mg | DELAYED_RELEASE_TABLET | Freq: Every day | ORAL | Status: DC
  Administered 2022-03-08 – 2022-03-10 (×3): 40 mg via ORAL
  Filled 2022-03-07 (×3): qty 1

## 2022-03-07 MED ORDER — KETAMINE HCL 10 MG/ML IJ SOLN
INTRAMUSCULAR | Status: DC | PRN
  Administered 2022-03-07 (×2): 10 mg via INTRAVENOUS

## 2022-03-07 MED ORDER — KETAMINE HCL 50 MG/5ML IJ SOSY
PREFILLED_SYRINGE | INTRAMUSCULAR | Status: AC
  Filled 2022-03-07: qty 5

## 2022-03-07 MED ORDER — ASPIRIN 81 MG PO CHEW
81.0000 mg | CHEWABLE_TABLET | Freq: Two times a day (BID) | ORAL | Status: DC
  Administered 2022-03-08 – 2022-03-10 (×4): 81 mg via ORAL
  Filled 2022-03-07 (×5): qty 1

## 2022-03-07 MED ORDER — PROPOFOL 10 MG/ML IV BOLUS
INTRAVENOUS | Status: AC
  Filled 2022-03-07: qty 20

## 2022-03-07 MED ORDER — PROPOFOL 1000 MG/100ML IV EMUL
INTRAVENOUS | Status: AC
  Filled 2022-03-07: qty 100

## 2022-03-07 MED ORDER — ROSUVASTATIN CALCIUM 10 MG PO TABS
10.0000 mg | ORAL_TABLET | Freq: Every day | ORAL | Status: DC
  Administered 2022-03-07 – 2022-03-09 (×3): 10 mg via ORAL
  Filled 2022-03-07 (×3): qty 1

## 2022-03-07 MED ORDER — KETOROLAC TROMETHAMINE 15 MG/ML IJ SOLN
7.5000 mg | Freq: Four times a day (QID) | INTRAMUSCULAR | Status: AC
  Administered 2022-03-07 – 2022-03-08 (×4): 7.5 mg via INTRAVENOUS
  Filled 2022-03-07 (×4): qty 1

## 2022-03-07 MED ORDER — ACETAMINOPHEN 500 MG PO TABS
1000.0000 mg | ORAL_TABLET | Freq: Four times a day (QID) | ORAL | Status: AC
  Administered 2022-03-07 – 2022-03-08 (×4): 1000 mg via ORAL
  Filled 2022-03-07 (×4): qty 2

## 2022-03-07 MED ORDER — ONDANSETRON HCL 4 MG PO TABS: 4.0000 mg | ORAL_TABLET | Freq: Four times a day (QID) | ORAL | Status: DC | PRN

## 2022-03-07 MED ORDER — METHOCARBAMOL 500 MG IVPB - SIMPLE MED: 500.0000 mg | Freq: Four times a day (QID) | INTRAVENOUS | Status: DC | PRN

## 2022-03-07 MED ORDER — HYDROMORPHONE HCL 2 MG/ML IJ SOLN
INTRAMUSCULAR | Status: AC
  Filled 2022-03-07: qty 1

## 2022-03-07 MED ORDER — ALBUTEROL SULFATE (2.5 MG/3ML) 0.083% IN NEBU
2.5000 mg | INHALATION_SOLUTION | Freq: Once | RESPIRATORY_TRACT | Status: AC
  Administered 2022-03-07: 2.5 mg via RESPIRATORY_TRACT
  Filled 2022-03-07: qty 3

## 2022-03-07 MED ORDER — NEBIVOLOL HCL 5 MG PO TABS
5.0000 mg | ORAL_TABLET | Freq: Every day | ORAL | Status: DC
  Administered 2022-03-07 – 2022-03-09 (×3): 5 mg via ORAL
  Filled 2022-03-07 (×4): qty 1

## 2022-03-07 MED ORDER — LOSARTAN POTASSIUM 25 MG PO TABS
25.0000 mg | ORAL_TABLET | Freq: Every day | ORAL | Status: DC
  Administered 2022-03-08: 25 mg via ORAL
  Filled 2022-03-07 (×2): qty 1

## 2022-03-07 MED ORDER — FENTANYL CITRATE PF 50 MCG/ML IJ SOSY
PREFILLED_SYRINGE | INTRAMUSCULAR | Status: AC
  Filled 2022-03-07: qty 2

## 2022-03-07 MED ORDER — DIPHENHYDRAMINE HCL 12.5 MG/5ML PO ELIX
12.5000 mg | ORAL_SOLUTION | ORAL | Status: DC | PRN
  Administered 2022-03-07 – 2022-03-08 (×3): 12.5 mg via ORAL
  Administered 2022-03-09: 25 mg via ORAL
  Filled 2022-03-07 (×2): qty 5
  Filled 2022-03-07: qty 10
  Filled 2022-03-07 (×2): qty 5

## 2022-03-07 MED ORDER — DEXAMETHASONE SODIUM PHOSPHATE 10 MG/ML IJ SOLN
INTRAMUSCULAR | Status: DC | PRN
  Administered 2022-03-07: 8 mg via INTRAVENOUS

## 2022-03-07 MED ORDER — FENTANYL CITRATE (PF) 100 MCG/2ML IJ SOLN
INTRAMUSCULAR | Status: DC | PRN
  Administered 2022-03-07: 50 ug via INTRAVENOUS
  Administered 2022-03-07 (×6): 25 ug via INTRAVENOUS

## 2022-03-07 MED ORDER — GABAPENTIN 300 MG PO CAPS
300.0000 mg | ORAL_CAPSULE | Freq: Once | ORAL | Status: AC
  Administered 2022-03-07: 300 mg via ORAL
  Filled 2022-03-07: qty 1

## 2022-03-07 MED ORDER — MUPIROCIN 2 % EX OINT
TOPICAL_OINTMENT | CUTANEOUS | Status: AC
  Administered 2022-03-07: 1 via TOPICAL
  Filled 2022-03-07: qty 22

## 2022-03-07 MED ORDER — FENTANYL CITRATE PF 50 MCG/ML IJ SOSY
100.0000 ug | PREFILLED_SYRINGE | INTRAMUSCULAR | Status: DC
  Filled 2022-03-07: qty 2

## 2022-03-07 MED ORDER — LACTATED RINGERS IV SOLN
INTRAVENOUS | Status: DC
  Administered 2022-03-07: 1000 mL via INTRAVENOUS

## 2022-03-07 MED ORDER — LACTATED RINGERS IV SOLN: INTRAVENOUS | Status: DC

## 2022-03-07 MED ORDER — MUPIROCIN 2 % EX OINT: 1.0000 | TOPICAL_OINTMENT | Freq: Once | CUTANEOUS | Status: AC

## 2022-03-07 MED ORDER — MENTHOL 3 MG MT LOZG: 1.0000 | LOZENGE | OROMUCOSAL | Status: DC | PRN

## 2022-03-07 MED ORDER — BUPIVACAINE LIPOSOME 1.3 % IJ SUSP
INTRAMUSCULAR | Status: AC
  Filled 2022-03-07: qty 20

## 2022-03-07 MED ORDER — FAMOTIDINE 20 MG PO TABS
40.0000 mg | ORAL_TABLET | Freq: Every day | ORAL | Status: DC
  Administered 2022-03-07 – 2022-03-09 (×3): 40 mg via ORAL
  Filled 2022-03-07 (×3): qty 2

## 2022-03-07 MED ORDER — DOCUSATE SODIUM 100 MG PO CAPS
100.0000 mg | ORAL_CAPSULE | Freq: Two times a day (BID) | ORAL | Status: DC
  Filled 2022-03-07 (×4): qty 1

## 2022-03-07 MED ORDER — MONTELUKAST SODIUM 10 MG PO TABS
10.0000 mg | ORAL_TABLET | Freq: Every day | ORAL | Status: DC
  Administered 2022-03-07 – 2022-03-09 (×3): 10 mg via ORAL
  Filled 2022-03-07 (×3): qty 1

## 2022-03-07 MED ORDER — CEFAZOLIN SODIUM-DEXTROSE 2-4 GM/100ML-% IV SOLN
2.0000 g | INTRAVENOUS | Status: AC
  Administered 2022-03-07 (×2): 2 g via INTRAVENOUS
  Filled 2022-03-07: qty 100

## 2022-03-07 MED ORDER — FENTANYL CITRATE PF 50 MCG/ML IJ SOSY
25.0000 ug | PREFILLED_SYRINGE | INTRAMUSCULAR | Status: DC | PRN
  Administered 2022-03-07: 50 ug via INTRAVENOUS

## 2022-03-07 MED ORDER — AMISULPRIDE (ANTIEMETIC) 5 MG/2ML IV SOLN: 10.0000 mg | Freq: Once | INTRAVENOUS | Status: DC | PRN

## 2022-03-07 MED ORDER — OXYCODONE HCL 5 MG PO TABS
10.0000 mg | ORAL_TABLET | ORAL | Status: DC | PRN
  Administered 2022-03-07: 10 mg via ORAL
  Administered 2022-03-08: 15 mg via ORAL
  Filled 2022-03-07: qty 2
  Filled 2022-03-07: qty 3

## 2022-03-07 MED ORDER — ONDANSETRON HCL 4 MG/2ML IJ SOLN
INTRAMUSCULAR | Status: AC
  Filled 2022-03-07: qty 2

## 2022-03-07 MED ORDER — PROPOFOL 500 MG/50ML IV EMUL
INTRAVENOUS | Status: DC | PRN
  Administered 2022-03-07: 150 ug/kg/min via INTRAVENOUS

## 2022-03-07 MED ORDER — CEFAZOLIN SODIUM-DEXTROSE 2-4 GM/100ML-% IV SOLN
2.0000 g | Freq: Three times a day (TID) | INTRAVENOUS | Status: AC
  Administered 2022-03-07 – 2022-03-08 (×3): 2 g via INTRAVENOUS
  Filled 2022-03-07 (×3): qty 100

## 2022-03-07 MED ORDER — ORAL CARE MOUTH RINSE
15.0000 mL | Freq: Once | OROMUCOSAL | Status: AC
  Administered 2022-03-07: 15 mL via OROMUCOSAL

## 2022-03-07 MED ORDER — FENTANYL CITRATE (PF) 100 MCG/2ML IJ SOLN
INTRAMUSCULAR | Status: AC
  Filled 2022-03-07: qty 2

## 2022-03-07 MED ORDER — ISOPROPYL ALCOHOL 70 % SOLN
Status: DC | PRN
  Administered 2022-03-07: 1 via TOPICAL

## 2022-03-07 MED ORDER — METHOCARBAMOL 500 MG PO TABS
500.0000 mg | ORAL_TABLET | Freq: Four times a day (QID) | ORAL | Status: DC | PRN
  Filled 2022-03-07: qty 1

## 2022-03-07 MED ORDER — POLYETHYLENE GLYCOL 3350 17 G PO PACK: 17.0000 g | PACK | Freq: Every day | ORAL | Status: DC | PRN

## 2022-03-07 MED ORDER — PHENOL 1.4 % MT LIQD: 1.0000 | OROMUCOSAL | Status: DC | PRN

## 2022-03-07 MED ORDER — SODIUM CHLORIDE 0.9 % IV SOLN: INTRAVENOUS | Status: DC

## 2022-03-07 MED ORDER — POVIDONE-IODINE 10 % EX SWAB: 2.0000 | Freq: Once | CUTANEOUS | Status: DC

## 2022-03-07 MED ORDER — VANCOMYCIN HCL 1000 MG IV SOLR
INTRAVENOUS | Status: AC
  Filled 2022-03-07: qty 20

## 2022-03-07 MED ORDER — CHLORHEXIDINE GLUCONATE 0.12 % MT SOLN: 15.0000 mL | Freq: Once | OROMUCOSAL | Status: AC

## 2022-03-07 MED ORDER — WATER FOR IRRIGATION, STERILE IR SOLN
Status: DC | PRN
  Administered 2022-03-07: 2000 mL

## 2022-03-07 MED ORDER — TRANEXAMIC ACID-NACL 1000-0.7 MG/100ML-% IV SOLN
1000.0000 mg | INTRAVENOUS | Status: AC
  Administered 2022-03-07: 1000 mg via INTRAVENOUS
  Filled 2022-03-07: qty 100

## 2022-03-07 MED ORDER — LORAZEPAM 1 MG PO TABS
1.0000 mg | ORAL_TABLET | Freq: Two times a day (BID) | ORAL | Status: DC
  Administered 2022-03-07 – 2022-03-10 (×6): 1 mg via ORAL
  Filled 2022-03-07 (×6): qty 1

## 2022-03-07 MED ORDER — PROPOFOL 10 MG/ML IV BOLUS
INTRAVENOUS | Status: DC | PRN
  Administered 2022-03-07 (×3): 20 mg via INTRAVENOUS
  Administered 2022-03-07: 150 mg via INTRAVENOUS
  Administered 2022-03-07: 20 mg via INTRAVENOUS

## 2022-03-07 MED ORDER — MELATONIN 5 MG PO TABS
5.0000 mg | ORAL_TABLET | Freq: Every day | ORAL | Status: DC
  Administered 2022-03-07 – 2022-03-09 (×3): 5 mg via ORAL
  Filled 2022-03-07 (×3): qty 1

## 2022-03-07 MED ORDER — ACETAMINOPHEN 500 MG PO TABS: 1000.0000 mg | ORAL_TABLET | Freq: Once | ORAL | Status: DC

## 2022-03-07 MED ORDER — CEFAZOLIN SODIUM 1 G IJ SOLR
INTRAMUSCULAR | Status: AC
  Filled 2022-03-07: qty 20

## 2022-03-07 MED ORDER — SODIUM CHLORIDE (PF) 0.9 % IJ SOLN
INTRAMUSCULAR | Status: AC
  Filled 2022-03-07: qty 50

## 2022-03-07 MED ORDER — FLUTICASONE PROPIONATE 50 MCG/ACT NA SUSP
2.0000 | Freq: Every day | NASAL | Status: DC
  Administered 2022-03-08: 2 via NASAL
  Filled 2022-03-07: qty 16

## 2022-03-07 MED ORDER — OXYCODONE HCL 5 MG PO TABS
5.0000 mg | ORAL_TABLET | ORAL | Status: DC | PRN
  Administered 2022-03-07: 5 mg via ORAL
  Filled 2022-03-07: qty 1

## 2022-03-07 MED ORDER — ALBUTEROL SULFATE (2.5 MG/3ML) 0.083% IN NEBU: 2.5000 mg | INHALATION_SOLUTION | RESPIRATORY_TRACT | Status: DC | PRN

## 2022-03-07 MED ORDER — DEXAMETHASONE SODIUM PHOSPHATE 10 MG/ML IJ SOLN
INTRAMUSCULAR | Status: AC
  Filled 2022-03-07: qty 1

## 2022-03-07 MED ORDER — ACETAMINOPHEN 325 MG PO TABS
325.0000 mg | ORAL_TABLET | Freq: Four times a day (QID) | ORAL | Status: DC | PRN
  Administered 2022-03-09 – 2022-03-10 (×2): 650 mg via ORAL
  Filled 2022-03-07 (×2): qty 2

## 2022-03-07 MED ORDER — ISOPROPYL ALCOHOL 70 % SOLN
Status: AC
  Filled 2022-03-07: qty 480

## 2022-03-07 MED ORDER — ROPIVACAINE HCL 5 MG/ML IJ SOLN
INTRAMUSCULAR | Status: DC | PRN
  Administered 2022-03-07: 20 mL via PERINEURAL

## 2022-03-07 MED ORDER — SODIUM CHLORIDE (PF) 0.9 % IJ SOLN
INTRAMUSCULAR | Status: AC
  Filled 2022-03-07: qty 10

## 2022-03-07 MED ORDER — ACETAMINOPHEN 500 MG PO TABS
1000.0000 mg | ORAL_TABLET | Freq: Once | ORAL | Status: AC
  Administered 2022-03-07: 1000 mg via ORAL
  Filled 2022-03-07: qty 2

## 2022-03-07 MED ORDER — HYDROMORPHONE HCL 1 MG/ML IJ SOLN
0.5000 mg | INTRAMUSCULAR | Status: DC | PRN
  Administered 2022-03-07 – 2022-03-09 (×2): 1 mg via INTRAVENOUS
  Filled 2022-03-07 (×4): qty 1

## 2022-03-07 MED ORDER — MIDAZOLAM HCL 2 MG/2ML IJ SOLN
2.0000 mg | INTRAMUSCULAR | Status: DC
  Administered 2022-03-07: 2 mg via INTRAVENOUS
  Filled 2022-03-07: qty 2

## 2022-03-07 MED ORDER — BUPIVACAINE LIPOSOME 1.3 % IJ SUSP: 20.0000 mL | Freq: Once | INTRAMUSCULAR | Status: DC

## 2022-03-07 MED ORDER — MONTELUKAST SODIUM 10 MG PO TABS: 10.0000 mg | ORAL_TABLET | Freq: Every day | ORAL | Status: DC

## 2022-03-07 MED ORDER — HYDROMORPHONE HCL 1 MG/ML IJ SOLN
INTRAMUSCULAR | Status: DC | PRN
  Administered 2022-03-07 (×3): .4 mg via INTRAVENOUS

## 2022-03-07 MED ORDER — SODIUM CHLORIDE 0.9 % IR SOLN
Status: DC | PRN
  Administered 2022-03-07: 3000 mL

## 2022-03-07 SURGICAL SUPPLY — 76 items
ADH SKN CLS APL DERMABOND .7 (GAUZE/BANDAGES/DRESSINGS) ×1
APL PRP STRL LF DISP 70% ISPRP (MISCELLANEOUS) ×2
ARTICULAR GENESIS CONST IN (Insert) ×1 IMPLANT
ARTISURF GENESIS (Insert) IMPLANT
AUGMENT TIBIAL SZ 3 LEFT KNEE (Knees) IMPLANT
BAG COUNTER SPONGE SURGICOUNT (BAG) IMPLANT
BAG SPNG CNTER NS LX DISP (BAG)
BLADE HEX COATED 2.75 (ELECTRODE) ×1 IMPLANT
BLADE SAG 18X100X1.27 (BLADE) ×1 IMPLANT
BLADE SAW SAG 35X64 .89 (BLADE) ×1 IMPLANT
BNDG CMPR MED 10X6 ELC LF (GAUZE/BANDAGES/DRESSINGS) ×1
BNDG ELASTIC 6X10 VLCR STRL LF (GAUZE/BANDAGES/DRESSINGS) ×1 IMPLANT
BOWL SMART MIX CTS (DISPOSABLE) ×1 IMPLANT
BSPLAT TIB 3 CMNT REV F TPR KN (Knees) ×1 IMPLANT
CEMENT BONE REFOBACIN R1X40 US (Cement) ×2 IMPLANT
CHLORAPREP W/TINT 26 (MISCELLANEOUS) ×2 IMPLANT
COMP FEMORAL CONST SZ5 LT KNEE (Knees) ×1 IMPLANT
COMPONENT FEMRL CONST SZ5LT KN (Knees) IMPLANT
COUPLER OFFSET 4MM (INSTRUMENTS) IMPLANT
COVER SURGICAL LIGHT HANDLE (MISCELLANEOUS) ×1 IMPLANT
CUFF TOURN SGL QUICK 34 (TOURNIQUET CUFF) ×1
CUFF TRNQT CYL 34X4.125X (TOURNIQUET CUFF) ×1 IMPLANT
DERMABOND ADVANCED .7 DNX12 (GAUZE/BANDAGES/DRESSINGS) ×1 IMPLANT
DRAPE INCISE IOBAN 66X45 STRL (DRAPES) IMPLANT
DRAPE INCISE IOBAN 85X60 (DRAPES) ×1 IMPLANT
DRAPE SHEET LG 3/4 BI-LAMINATE (DRAPES) ×1 IMPLANT
DRAPE U-SHAPE 47X51 STRL (DRAPES) ×1 IMPLANT
DRESSING AQUACEL AG SP 3.5X10 (GAUZE/BANDAGES/DRESSINGS) ×1 IMPLANT
DRESSING PEEL AND PLC PRVNA 13 (GAUZE/BANDAGES/DRESSINGS) IMPLANT
DRSG AQUACEL AG SP 3.5X10 (GAUZE/BANDAGES/DRESSINGS) ×1
DRSG PEEL AND PLACE PREVENA 13 (GAUZE/BANDAGES/DRESSINGS) ×1
GLOVE BIO SURGEON STRL SZ7.5 (GLOVE) ×2 IMPLANT
GLOVE BIO SURGEON STRL SZ8 (GLOVE) ×2 IMPLANT
GLOVE BIOGEL PI IND STRL 7.5 (GLOVE) ×1 IMPLANT
GLOVE BIOGEL PI IND STRL 8 (GLOVE) ×1 IMPLANT
GOWN STRL REUS W/ TWL XL LVL3 (GOWN DISPOSABLE) ×1 IMPLANT
GOWN STRL REUS W/TWL XL LVL3 (GOWN DISPOSABLE) ×1
HANDPIECE INTERPULSE COAX TIP (DISPOSABLE) ×1
HOLDER FOLEY CATH W/STRAP (MISCELLANEOUS) IMPLANT
HOOD PEEL AWAY T7 (MISCELLANEOUS) ×3 IMPLANT
INSERT XLPE 11MM SZ 3-4 (Knees) IMPLANT
KIT DRSG PREVENA PLUS 7DAY 125 (MISCELLANEOUS) IMPLANT
MANIFOLD NEPTUNE II (INSTRUMENTS) ×1 IMPLANT
MARKER SKIN DUAL TIP RULER LAB (MISCELLANEOUS) ×2 IMPLANT
NS IRRIG 1000ML POUR BTL (IV SOLUTION) ×1 IMPLANT
OSTEOTOME THIN 10.0 3 (INSTRUMENTS) IMPLANT
OSTEOTOME THIN 6.0 3 (INSTRUMENTS) IMPLANT
PACK TOTAL KNEE CUSTOM (KITS) ×1 IMPLANT
PIN SPEED NONRIM 65 STERL KNEE (PIN) IMPLANT
PIN TROCAR 3 INCH (PIN) IMPLANT
PROTECTOR NERVE ULNAR (MISCELLANEOUS) ×1 IMPLANT
SET HNDPC FAN SPRY TIP SCT (DISPOSABLE) ×1 IMPLANT
SOLUTION IRRIG SURGIPHOR (IV SOLUTION) ×1 IMPLANT
SPIKE FLUID TRANSFER (MISCELLANEOUS) ×1 IMPLANT
STEM FEM CMTLS KNEE STD 18X120 (Stem) IMPLANT
STEM PRESSFIT STR 15X20 KNEE (Stem) IMPLANT
SUT ETHILON 3 0 PS 1 (SUTURE) ×4 IMPLANT
SUT MNCRL AB 3-0 PS2 18 (SUTURE) ×1 IMPLANT
SUT STRATAFIX 0 PDS 27 VIOLET (SUTURE) ×2
SUT STRATAFIX 1PDS 45CM VIOLET (SUTURE) ×1 IMPLANT
SUT STRATAFIX PDO 1 14 VIOLET (SUTURE) ×1
SUT STRATFX PDO 1 14 VIOLET (SUTURE) ×1
SUT VIC AB 2-0 CT1 27 (SUTURE) ×1
SUT VIC AB 2-0 CT1 TAPERPNT 27 (SUTURE) IMPLANT
SUT VIC AB 2-0 CT2 27 (SUTURE) ×2 IMPLANT
SUTURE STRATFX 0 PDS 27 VIOLET (SUTURE) ×1 IMPLANT
SUTURE STRATFX PDO 1 14 VIOLET (SUTURE) ×1 IMPLANT
SYR 50ML LL SCALE MARK (SYRINGE) ×1 IMPLANT
TIBIA SZ 3 LEFT KNEE (Knees) ×1 IMPLANT
TRAY FOLEY MTR SLVR 14FR STAT (SET/KITS/TRAYS/PACK) IMPLANT
TRAY FOLEY MTR SLVR 16FR STAT (SET/KITS/TRAYS/PACK) IMPLANT
TUBE SUCTION HIGH CAP CLEAR NV (SUCTIONS) ×1 IMPLANT
UNDERPAD 30X36 HEAVY ABSORB (UNDERPADS AND DIAPERS) ×1 IMPLANT
WEDGE FEM REV ANG 5X10X5 10D (Orthopedic Implant) IMPLANT
WEDGE FEM REV ANG 5X15X5 15D (Orthopedic Implant) IMPLANT
WRAP KNEE MAXI GEL POST OP (GAUZE/BANDAGES/DRESSINGS) IMPLANT

## 2022-03-07 NOTE — Anesthesia Procedure Notes (Addendum)
Spinal  Patient location during procedure: OR Start time: 03/07/2022 12:14 PM End time: 03/07/2022 12:20 PM Reason for block: surgical anesthesia Staffing Performed: anesthesiologist  Anesthesiologist: Nilda Simmer, MD Performed by: Nilda Simmer, MD Authorized by: Nilda Simmer, MD   Preanesthetic Checklist Completed: patient identified, IV checked, site marked, risks and benefits discussed, surgical consent, monitors and equipment checked, pre-op evaluation and timeout performed Spinal Block Patient position: sitting Prep: DuraPrep Patient monitoring: blood pressure and continuous pulse ox Approach: midline Location: L3-4 Injection technique: single-shot Needle Needle type: Pencan and Quincke  Needle gauge: 22 G Needle length: 9 cm Additional Notes Risks and benefits of neuraxial anesthesia including, but not limited to, infection, bleeding, local anesthetic toxicity, headache, hypotension, back pain, block failure, etc. were discussed with the patient. The patient expressed understanding and consented to the procedure. I confirmed that the patient has no bleeding disorders and is not taking blood thinners. I confirmed the patient's last platelet count with the nurse. Monitors were applied. A time-out was performed immediately prior to the procedure. Sterile technique was used throughout the whole procedure.   Unsuccessful attempt at spinal.

## 2022-03-07 NOTE — Progress Notes (Signed)
Orthopedic Tech Progress Note Patient Details:  Brenda Craig [birth year] [id number]  Ortho Devices Type of Ortho Device: Bone foam zero knee Ortho Device/Splint Location: LLE Ortho Device/Splint Interventions: Ordered, Application   Post Interventions Patient Tolerated: Well  Linus Salmons Durand Wittmeyer 03/07/2022, 6:07 PM

## 2022-03-07 NOTE — Op Note (Signed)
DATE OF SURGERY:  03/07/2022 TIME: 4:03 PM  PATIENT NAME:  Brenda Craig   AGE: 62 y.o.    PRE-OPERATIVE DIAGNOSIS: Aseptic loosening left total knee arthroplasty  POST-OPERATIVE DIAGNOSIS: Aseptic loosening left total knee arthroplasty, flexion instability  PROCEDURE: Revision left total Knee Arthroplasty  SURGEON:  Pegeen Stiger A Evanna Washinton, MD   ASSISTANT: Izola Price, RNFA, present and scrubbed throughout the case, critical for assistance with exposure, retraction, instrumentation, and closure.   OPERATIVE IMPLANTS:  [place] left Legion revision unit X MDM size 5 femoral component, femoral augment 10 distal, 5 posterior for the medial side, femoral augment 15 mm distal, 5 mm posterior for the medial side, size 3 revision tibial baseplate Legion, 4 mm Legion offset coupler x 2, 18 mm x 120 mm stem for the femoral side, 15 mm x 120 mm femoral stem for the tibial side size 11 Legion PS highly cross-linked poly Implant Name Type Inv. Item Serial No. Manufacturer Lot No. LRB No. Used Action  CEMENT BONE REFOBACIN [id number] [place] - [id number] Cement CEMENT BONE REFOBACIN [id number] [place]  [employer](ORTH,TRAU,BIO,SG) [id number] Left 1 Implanted  CEMENT BONE REFOBACIN R1X40 [place] - [id number] Cement CEMENT BONE REFOBACIN [id number] [place]  [employer](ORTH,TRAU,BIO,SG) [id number] Left 1 Implanted  TIBIA SZ 3 LEFT KNEE - [id number] Knees TIBIA SZ 3 LEFT KNEE  [employer] [id number] Left 1 Implanted  LEGION PRESSFIT STEM    [employer] [id number] Left 1 Implanted  LEGION 10MM DIS X 5 MM POS SCREW ON FEMORAL L WEDGE    [employer] [id number] Left 1 Implanted  LEGION 15MM DIS X 5 MM POS SCREW ON FEMORAL L WEDGE     [id number] Left 1 Implanted  LEGION OXINIUM SIZE 5 LEFT CONSTRAINED NONPOROUS FEMORAL COMPONENT     [id number] Left 1 Implanted  LEGION PRESSFIT STEM     [id number] Left 1 Implanted  ARTICULAR GENESIS CONST IN - [id number] Insert ARTICULAR GENESIS CONST IN   [place] [employer] [id number] Left 1 Implanted  INSERT XLPE 11MM SZ 3-4 - [id number] Knees INSERT XLPE 11MM SZ 3-4  [employer] [id number] Left 1 Implanted  INSERT XLPE 11MM SZ 3-4 - [id number] Knees INSERT XLPE 11MM SZ 3-4  [employer] [id number] Left 1 Implanted      PREOPERATIVE INDICATIONS:  Brenda Craig is a 62 y.o. year old female with end stage bone on bone degenerative arthritis of the knee who failed conservative treatment, including injections, antiinflammatories, activity modification, and assistive devices, and had significant impairment of their activities of daily living, and elected for Total Knee Arthroplasty.   The risks, benefits, and alternatives were discussed at length including but not limited to the risks of infection, bleeding, nerve injury, stiffness, blood clots, the need for revision surgery, cardiopulmonary complications, among others, and they were willing to proceed.  OPERATIVE FINDINGS AND UNIQUE ASPECTS OF THE CASE: Removal of the femoral component with no significant bone loss, concern for debonding from the cement bone interface, removal of tibial component with concern for debonding at the implant cement interface, relative flexion instability  ESTIMATED BLOOD LOSS: 200cc   OPERATIVE DESCRIPTION:   Once adequate anesthesia was induced, preoperative antibiotics, 2 gm of ancef,1 gm of Tranexamic Acid, and 8 mg of Decadron administered, the patient was positioned supine with a left thigh tourniquet placed.  The left lower extremity was prepped and draped in sterile fashion.  A time-  out was performed identifying the  patient, planned procedure, and the appropriate extremity.  The leg was examined and found to have range of motion 0 to 130 degrees.  Notably and flexion there is about 1 cm anterior to posterior translation.  In extension there was about 1 to 2 mm of varus valgus play.  This I felt that the knee likely  had some flexion instability.   The leg was  exsanguinated, tourniquet elevated to 250 mmHg.  A midline incision was made utilizing his old scar.  A medial parapatellar arthrotomy was performed.  A small serous effusion was appreciated.  No evidence of purulence or infection.  A circumferential synovectomy was performed.  3 synovial tissue specimens were sent from the infrapatellar, lateral, medial gutters.  These were sent for aerobic anaerobic cultures.  We next performed a large medial release off the tibial plateau with Bovie cautery, to the posterior medial aspect of the tibia..   Suprapatellar fat pad was resected.  Soft tissue at the bone implant interface was resected.  Notably in the medial aspect of the femur there was significant debonding at the cement bone interface.  Using a small osteotome of the polyethylene liner was removed.  There was no significant wear or damage to the polyethylene insert. The patella appeared to be well-fixed and tracking well preoperatively.   We next turned our attention to explanting the femoral and tibial components.  The peripheral edge was exposed circumferentially around the femoral component using Bovie cautery and a rongeur.  Next using quarter inch straight osteotome, quarter inch Shukla flexible osteotome, and a small ACL blade saw.  We were able to break up the implant cement interface circumferentially.  Next using a shukla extractor we were able to easily remove the femoral component.  There was minimal bone loss from the extraction.  There was minimal cemented left on the bone following femoral component removal.   Next we turned our attention to explanting the tibial component.  The tibia was also exposed circumferentially and using a ACL saw blade and quarter inch osteotome we were able to break up the implant cement interface.  The tibial component was able to be removed using the [facility] extractor.  Notably there was no cement bonded to the tibial  component..  We then used the morelin instruments to remove the cement mantle including the cement from the keel.  Once this was removed we had access to the canal for reaming.  Morelin reverse curettes were then used to clear the femoral and tibial canals for reaming.   We next turned our attention to preparing the femoral and and tibial canal for a 120 mm press-fit stem.  We sequentially reamed up to 61m on the femur and 168mon the tibia which had good cortical fit..  We performed a freshen up cut of the distal femur and had good distal bone through the 85m90mut medially and 27m23mt laterally.      Next we turned our attention to tibial preparation.  We then performed a freshen up cut of the proximal tibia at 0 degrees, cutting 1 mm off posterior tibia with the stylus through the 0mm 585m.  Notably there was significant slope of the tibial component so removed about 4-5 mm of anterior tibial bone with minimal posterior tibial bone resection.  Following removal the tibial bone quality was excellent and supportive.  Minimal central bone loss.  We then sized the tibial component to be a size 3 which had good fit on the remaining  tibial plateau.  And relative to the tibial canal the baseplate sat relatively posterior medial.  We used a 4 mm offset and were able to bring the baseplate anterior.  There was minimal central bone loss from the old tibial keel, and elected not to use a cone given the large size of the offset coupler which sufficiently filled the central defect.  We also punched for our wings and had good bone stock and good rotational stability.Marland Kitchen   Next we turned our attention to femoral sizing.  We felt given the size of the femoral component a size 5 Legion component would be a good match.  This had good fit medial to lateral.  Elected to posterior rise the femoral component to help close down the flexion space.  Used a 4 mm offset coupler and position the femoral component relatively posterior.    The rotation of his prior implant does seem to match the transepicondylar axis well, and the patient had good preoperative tracking.  No significant bone loss anterior was appreciated. There was mild posterior bone loss so we cut for 5 mm posterior augments both medially and laterally.  Notably with the trial insert the knee felt relatively loose in extension compared to flexion.  Elected to distal lysed the femoral component additional 5 mm to help close down the extension space without tightening up the flexion space.  Now we had 10 mm medial and 15 mm lateral augment.  With the trial components and the distance from the medial epicondyle to the joint line was 30 mm medially and 25 mm laterally.  We trialed up to a 11 mm insert which had full extension good stability with varus valgus stress and also had about 3 to 4 mm of anterior posterior translation at 90 degrees of flexion.   During this time the tourniquet which was at 120 minutes was deflated, and left down for 20 minutes as we assembled the real components on the back table..   The final components were opened and assembled on the back table.  The tourniquet was reinflated for cementing.The components were cemented using antibiotic cement.  Felt we had good press-fit with our stems. The knee was irrigated with surgery for irrigation and normal saline pulse lavage.  The synovial lining was  then injected a dilute Exparel.  We again checked stability and felt 11 mm insert gave improved stability was appropriate with about 1 mm opening medially and 2 mm opening laterally.  And good stability in flexion as well with about 3 to 4 mm anterior to posterior translation. The final poly was opened and inserted.  Unfortunately given challenges inserting the poly ended up deforming the locking mechanism so had to open an additional poly insert which then seated appropriately.  Patellar tracking was excellent.         The tourniquet was again let down.  No  significant  hemostasis was required.  The medial parapatellar arthrotomy was then reapproximated using   #1 Stratafix sutures with the knee in 45 degrees of flexion.  The  remaining wound was closed with 0 stratafix, 2-0 Vicryl, and interrupted 3-0 Nylon  The knee was cleaned, dried, dressed with a Prevena incisional wound VAC.  The patient was then  brought to recovery room in stable condition, tolerating the procedure  well. There were no complications.   Post op recs: WB: WBAT Abx: ancef  while in house, followed by 7 days of cefadroxil 500 twice daily, follow up intra-op tissue sent  for culture. Imaging: PACU xrays DVT prophylaxis: Aspirin '81mg'$  BID x4 weeks Follow up: 2 weeks after surgery for a wound check with Dr. Zachery Dakins at [facility].  Address: [address], [place], [place] [address]  Office Phone: [phone]   Charlies Constable, MD Orthopaedic Surgery

## 2022-03-07 NOTE — Interval H&P Note (Signed)
The patient has been re-examined, and the chart reviewed, and there have been no interval changes to the documented history and physical.    Plan for L TKA revision for aseptic loosening  The operative side was examined and the patient was confirmed to have sensation to DPN, SPN, TN intact, Motor EHL, ext, flex 5/5, and DP 2+, PT 2+, No significant edema.   The risks, benefits, and alternatives have been discussed at length with patient, and the patient is willing to proceed.  Left knee marked. Consent has been signed.

## 2022-03-07 NOTE — Transfer of Care (Signed)
Immediate Anesthesia Transfer of Care Note  Patient: Brenda Craig  Procedure(s) Performed: TOTAL KNEE REVISION (Left: Knee)  Patient Location: PACU  Anesthesia Type:GA combined with regional for post-op pain  Level of Consciousness: drowsy  Airway & Oxygen Therapy: Patient Spontanous Breathing and Patient connected to face mask oxygen  Post-op Assessment: Report given to RN and Post -op Vital signs reviewed and stable  Post vital signs: Reviewed and stable  Last Vitals:  Vitals Value Taken Time  BP 133/81 03/07/22 1655  Temp    Pulse 95 03/07/22 1658  Resp 20 03/07/22 1658  SpO2 90 % 03/07/22 1658  Vitals shown include unvalidated device data.  Last Pain:  Vitals:   03/07/22 1110  TempSrc: Oral  PainSc: 0-No pain         Complications: No notable events documented.

## 2022-03-07 NOTE — Discharge Instructions (Signed)

## 2022-03-07 NOTE — Anesthesia Procedure Notes (Signed)
Anesthesia Regional Block: Adductor canal block   Pre-Anesthetic Checklist: , timeout performed,  Correct Patient, Correct Site, Correct Laterality,  Correct Procedure, Correct Position, site marked,  Risks and benefits discussed,  Surgical consent,  Pre-op evaluation,  At surgeon's request and post-op pain management  Laterality: Left  Prep: chloraprep       Needles:  Injection technique: Single-shot  Needle Type: Echogenic Stimulator Needle     Needle Length: 9cm  Needle Gauge: 21     Additional Needles:   Procedures:,,,, ultrasound used (permanent image in chart),,    Narrative:  Start time: 03/07/2022 11:11 AM End time: 03/07/2022 11:13 AM Injection made incrementally with aspirations every 5 mL.  Performed by: Personally  Anesthesiologist: Nilda Simmer, MD  Additional Notes: Discussed risks and benefits of nerve block including, but not limited to, prolonged and/or permanent nerve injury involving sensory and/or motor function. Monitors were applied and a time-out was performed. The nerve and associated structures were visualized under ultrasound guidance. After negative aspiration, local anesthetic was slowly injected around the nerve. There was no evidence of high pressure during the procedure. There were no paresthesias. VSS remained stable and the patient tolerated the procedure well.

## 2022-03-07 NOTE — Anesthesia Procedure Notes (Signed)
Procedure Name: LMA Insertion Date/Time: 03/07/2022 12:22 PM  Performed by: Niel Hummer, CRNAPre-anesthesia Checklist: Patient identified, Emergency Drugs available, Suction available and Patient being monitored Patient Re-evaluated:Patient Re-evaluated prior to induction Oxygen Delivery Method: Circle system utilized Preoxygenation: Pre-oxygenation with 100% oxygen Induction Type: IV induction LMA: LMA inserted LMA Size: 4.0 Number of attempts: 1 Dental Injury: Teeth and Oropharynx as per pre-operative assessment

## 2022-03-07 NOTE — Anesthesia Postprocedure Evaluation (Signed)
Anesthesia Post Note  Patient: Brenda Craig  Procedure(s) Performed: TOTAL KNEE REVISION (Left: Knee)     Patient location during evaluation: PACU Anesthesia Type: MAC Level of consciousness: awake and alert Pain management: pain level controlled Vital Signs Assessment: post-procedure vital signs reviewed and stable Respiratory status: spontaneous breathing, respiratory function stable and patient connected to nasal cannula oxygen Cardiovascular status: blood pressure returned to baseline and stable Postop Assessment: spinal receding Anesthetic complications: no   No notable events documented.  Last Vitals:  Vitals:   03/07/22 1715 03/07/22 1730  BP: (!) 141/96 (!) 145/92  Pulse: 87 85  Resp: 18 15  Temp:    SpO2: 94% 91%    Last Pain:  Vitals:   03/07/22 1655  TempSrc:   PainSc: Asleep                 [name] DANIEL

## 2022-03-08 ENCOUNTER — Encounter (HOSPITAL_COMMUNITY): Payer: Self-pay | Admitting: Orthopedic Surgery

## 2022-03-08 LAB — BASIC METABOLIC PANEL
Anion gap: 5 (ref 5–15)
BUN: 16 mg/dL (ref 8–23)
CO2: 25 mmol/L (ref 22–32)
Calcium: 8.3 mg/dL — ABNORMAL LOW (ref 8.9–10.3)
Chloride: 104 mmol/L (ref 98–111)
Creatinine, Ser: 0.87 mg/dL (ref 0.44–1.00)
GFR, Estimated: >60 mL/min (ref >60)
Glucose, Bld: 148 mg/dL — ABNORMAL HIGH (ref 70–99)
Potassium: 4.6 mmol/L (ref 3.5–5.1)
Sodium: 134 mmol/L — ABNORMAL LOW (ref 135–145)

## 2022-03-08 LAB — CBC
HCT: 36.8 % (ref 36.0–46.0)
Hemoglobin: 11.3 g/dL — ABNORMAL LOW (ref 12.0–15.0)
MCH: 28.5 pg (ref 26.0–34.0)
MCHC: 30.7 g/dL (ref 30.0–36.0)
MCV: 92.9 fL (ref 80.0–100.0)
Platelets: 221 10*3/uL (ref 150–400)
RBC: 3.96 MIL/uL (ref 3.87–5.11)
RDW: 13.6 % (ref 11.5–15.5)
WBC: 14.7 10*3/uL — ABNORMAL HIGH (ref 4.0–10.5)
nRBC: 0 % (ref 0.0–0.2)

## 2022-03-08 MED ORDER — HYDROMORPHONE HCL 2 MG PO TABS: 1.0000 mg | ORAL_TABLET | ORAL | Status: DC | PRN

## 2022-03-08 MED ORDER — HYDROMORPHONE HCL 2 MG PO TABS
3.0000 mg | ORAL_TABLET | ORAL | Status: DC | PRN
  Administered 2022-03-08 – 2022-03-10 (×12): 4 mg via ORAL
  Filled 2022-03-08 (×12): qty 2

## 2022-03-08 NOTE — Evaluation (Signed)
Physical Therapy Evaluation Patient Details Name: Brenda Craig MRN: [id number] DOB: 02-16-1960 Today's Date: 03/08/2022  History of Present Illness  Pt s/p L TKR revision and with hx of CHF, CKD, CAP, MVP, back surgery and L TKR (22)  Clinical Impression  Pt s/p L TKR revision and presents with decreased L LE strength/ROM and post op pain limiting functional mobility.  Pt motivated and should progress to dc home with family assist.  Pt reports HHPT follow up with [facility].     Recommendations for follow up therapy are one component of a multi-disciplinary discharge planning process, led by the attending physician.  Recommendations may be updated based on patient status, additional functional criteria and insurance authorization.  Follow Up Recommendations Follow physician's recommendations for discharge plan and follow up therapies      Assistance Recommended at Discharge Set up Supervision/Assistance  Patient can return home with the following  A little help with walking and/or transfers;A little help with bathing/dressing/bathroom;Assist for transportation;Help with stairs or ramp for entrance;Assistance with cooking/housework    Equipment Recommendations None recommended by PT  Recommendations for Other Services       Functional Status Assessment Patient has had a recent decline in their functional status and demonstrates the ability to make significant improvements in function in a reasonable and predictable amount of time.     Precautions / Restrictions Precautions Precautions: Knee;Fall Restrictions Weight Bearing Restrictions: No      Mobility  Bed Mobility Overal bed mobility: Needs Assistance Bed Mobility: Supine to Sit     Supine to sit: Min guard     General bed mobility comments: Increased time with pt self assisting L LE with R LE    Transfers Overall transfer level: Needs assistance Equipment used: Rolling walker (2 wheels) Transfers: Sit  to/from Stand Sit to Stand: Min assist           General transfer comment: cues for LE management and use of UEs to self assist    Ambulation/Gait Ambulation/Gait assistance: Min assist, Min guard Gait Distance (Feet): 58 Feet Assistive device: Rolling walker (2 wheels) Gait Pattern/deviations: Step-to pattern, Decreased step length - right, Decreased step length - left, Shuffle, Trunk flexed Gait velocity: decr     General Gait Details: cues for sequence, posture and position from [employer]            Wheelchair Mobility    Modified Rankin (Stroke Patients Only)       Balance Overall balance assessment: Needs assistance Sitting-balance support: No upper extremity supported, Feet supported Sitting balance-Leahy Scale: Good     Standing balance support: Single extremity supported Standing balance-Leahy Scale: Poor                               Pertinent Vitals/Pain Pain Assessment Pain Assessment: 0-10 Pain Score: 4  Pain Location: L knee Pain Descriptors / Indicators: Aching, Sore Pain Intervention(s): Limited activity within patient's tolerance, Monitored during session, Premedicated before session, Ice applied    Home Living Family/patient expects to be discharged to:: Private residence Living Arrangements: Children Available Help at Discharge: Family;Friend(s);Available 24 hours/day Type of Home: House Home Access: Stairs to enter Entrance Stairs-Rails: Right;Left;Can reach both Entrance Stairs-Number of Steps: 5   Home Layout: One level Home Equipment: Conservation officer, nature (2 wheels);BSC/3in1;Cane - single point;Shower seat      Prior Function Prior Level of Function : Independent/Modified Independent  Hand Dominance   Dominant Hand: Right    Extremity/Trunk Assessment   Upper Extremity Assessment Upper Extremity Assessment: Overall WFL for tasks assessed    Lower Extremity Assessment Lower Extremity  Assessment: LLE deficits/detail LLE Deficits / Details: 3-/5 quads with AAROM at knee -5 - 50    Cervical / Trunk Assessment Cervical / Trunk Assessment: Normal  Communication   Communication: No difficulties  Cognition Arousal/Alertness: Awake/alert Behavior During Therapy: WFL for tasks assessed/performed Overall Cognitive Status: Within Functional Limits for tasks assessed                                          General Comments      Exercises Total Joint Exercises Ankle Circles/Pumps: AROM, Both, 15 reps, Supine Quad Sets: AROM, Both, 10 reps, Supine Heel Slides: AAROM, Left, 15 reps, Supine Straight Leg Raises: AAROM, AROM, Left, 10 reps, Supine   Assessment/Plan    PT Assessment Patient needs continued PT services  PT Problem List Decreased strength;Decreased range of motion;Decreased activity tolerance;Decreased balance;Decreased knowledge of use of DME;Pain       PT Treatment Interventions DME instruction;Gait training;Stair training;Functional mobility training;Therapeutic activities;Therapeutic exercise;Patient/family education    PT Goals (Current goals can be found in the Care Plan section)  Acute Rehab PT Goals Patient Stated Goal: Regain IND PT Goal Formulation: With patient Time For Goal Achievement: 03/15/22 Potential to Achieve Goals: Good    Frequency 7X/week     Co-evaluation               AM-PAC PT "6 Clicks" Mobility  Outcome Measure Help needed turning from your back to your side while in a flat bed without using bedrails?: A Little Help needed moving from lying on your back to sitting on the side of a flat bed without using bedrails?: A Little Help needed moving to and from a bed to a chair (including a wheelchair)?: A Little Help needed standing up from a chair using your arms (e.g., wheelchair or bedside chair)?: A Little Help needed to walk in hospital room?: A Little Help needed climbing 3-5 steps with a railing?  : A Lot 6 Click Score: 17    End of Session Equipment Utilized During Treatment: Gait belt Activity Tolerance: Patient tolerated treatment well Patient left: in chair;with call bell/phone within reach;with chair alarm set Nurse Communication: Mobility status PT Visit Diagnosis: Difficulty in walking, not elsewhere classified (R26.2);Pain Pain - Right/Left: Left Pain - part of body: Knee    Time: 0918-0950 PT Time Calculation (min) (ACUTE ONLY): 32 min   Charges:   PT Evaluation $PT Eval Low Complexity: 1 Low PT Treatments $Therapeutic Exercise: 8-22 mins        Debe Coder PT Acute Rehabilitation Services Pager [phone] Office [phone]   [facility] 03/08/2022, 12:26 PM

## 2022-03-08 NOTE — Progress Notes (Signed)
Physical Therapy Treatment Patient Details Name: Brenda Craig MRN: [id number] DOB: 1960/11/29 Today's Date: 03/08/2022   History of Present Illness Pt s/p L TKR revision and with hx of CHF, CKD, CAP, MVP, back surgery and L TKR (22)    PT Comments    Pt continues motivated and progressing well with mobility.  Pt reports pain well controlled and is hopeful for return home tomorrow.   Recommendations for follow up therapy are one component of a multi-disciplinary discharge planning process, led by the attending physician.  Recommendations may be updated based on patient status, additional functional criteria and insurance authorization.  Follow Up Recommendations  Follow physician's recommendations for discharge plan and follow up therapies     Assistance Recommended at Discharge Set up Supervision/Assistance  Patient can return home with the following A little help with walking and/or transfers;A little help with bathing/dressing/bathroom;Assist for transportation;Help with stairs or ramp for entrance;Assistance with cooking/housework   Equipment Recommendations  None recommended by PT    Recommendations for Other Services       Precautions / Restrictions Precautions Precautions: Knee;Fall Restrictions Weight Bearing Restrictions: No     Mobility  Bed Mobility Overal bed mobility: Needs Assistance Bed Mobility: Supine to Sit, Sit to Supine     Supine to sit: Min guard Sit to supine: Min guard   General bed mobility comments: Increased time with pt self assisting L LE with R LE OOB and with gait belt back into bed    Transfers Overall transfer level: Needs assistance Equipment used: Rolling walker (2 wheels) Transfers: Sit to/from Stand Sit to Stand: Min guard           General transfer comment: cues for LE management and use of UEs to self assist    Ambulation/Gait Ambulation/Gait assistance: Min guard Gait Distance (Feet): 125 Feet Assistive device:  Rolling walker (2 wheels) Gait Pattern/deviations: Step-to pattern, Decreased step length - right, Decreased step length - left, Shuffle, Trunk flexed Gait velocity: decr     General Gait Details: cues for sequence, posture and position from [employer]             Wheelchair Mobility    Modified Rankin (Stroke Patients Only)       Balance Overall balance assessment: Needs assistance Sitting-balance support: No upper extremity supported, Feet supported Sitting balance-Leahy Scale: Good     Standing balance support: No upper extremity supported Standing balance-Leahy Scale: Fair                              Cognition Arousal/Alertness: Awake/alert Behavior During Therapy: WFL for tasks assessed/performed Overall Cognitive Status: Within Functional Limits for tasks assessed                                          Exercises Total Joint Exercises Ankle Circles/Pumps: AROM, Both, 15 reps, Supine Quad Sets: AROM, Both, 10 reps, Supine Heel Slides: AAROM, Left, 15 reps, Supine Straight Leg Raises: AAROM, AROM, Left, 10 reps, Supine    General Comments        Pertinent Vitals/Pain Pain Assessment Pain Assessment: 0-10 Pain Score: 4  Pain Location: L knee Pain Descriptors / Indicators: Aching, Sore Pain Intervention(s): Limited activity within patient's tolerance, Monitored during session, Premedicated before session, Ice applied    Home Living Family/patient expects to  be discharged to:: Private residence Living Arrangements: Children Available Help at Discharge: Family;Friend(s);Available 24 hours/day Type of Home: House Home Access: Stairs to enter Entrance Stairs-Rails: Right;Left;Can reach both Entrance Stairs-Number of Steps: 5   Home Layout: One level Home Equipment: Conservation officer, nature (2 wheels);BSC/3in1;Cane - single point;Shower seat      Prior Function            PT Goals (current goals can now be found in the  care plan section) Acute Rehab PT Goals Patient Stated Goal: Regain IND PT Goal Formulation: With patient Time For Goal Achievement: 03/15/22 Potential to Achieve Goals: Good Progress towards PT goals: Progressing toward goals    Frequency    7X/week      PT Plan Current plan remains appropriate    Co-evaluation              AM-PAC PT "6 Clicks" Mobility   Outcome Measure  Help needed turning from your back to your side while in a flat bed without using bedrails?: A Little Help needed moving from lying on your back to sitting on the side of a flat bed without using bedrails?: A Little Help needed moving to and from a bed to a chair (including a wheelchair)?: A Little Help needed standing up from a chair using your arms (e.g., wheelchair or bedside chair)?: A Little Help needed to walk in hospital room?: A Little Help needed climbing 3-5 steps with a railing? : A Lot 6 Click Score: 17    End of Session Equipment Utilized During Treatment: Gait belt Activity Tolerance: Patient tolerated treatment well Patient left: in chair;with call bell/phone within reach;with chair alarm set Nurse Communication: Mobility status PT Visit Diagnosis: Difficulty in walking, not elsewhere classified (R26.2);Pain Pain - Right/Left: Left Pain - part of body: Knee     Time: [id number] PT Time Calculation (min) (ACUTE ONLY): 27 min  Charges:  $Gait Training: 23-37 mins $Therapeutic Exercise: 8-22 mins                     Debe Coder PT Acute Rehabilitation Services Pager [phone] Office [phone]    Brenda Craig 03/08/2022, 2:31 PM

## 2022-03-08 NOTE — Plan of Care (Signed)
  Problem: Activity: Goal: Risk for activity intolerance will decrease Outcome: Progressing   Problem: Nutrition: Goal: Adequate nutrition will be maintained Outcome: Progressing   Problem: Coping: Goal: Level of anxiety will decrease Outcome: Progressing   Problem: Pain Managment: Goal: General experience of comfort will improve Outcome: Progressing   

## 2022-03-08 NOTE — Progress Notes (Signed)
     Subjective:  Patient reports pain as mild.  Will switch oxy to Dilaudid given nausea and itching.  Denies distal numbness and tingling.  Has not yet worked with physical therapy.  Objective:   VITALS:   Vitals:   03/07/22 1800 03/07/22 1812 03/07/22 1938 03/08/22 0556  BP: 134/88 126/86 130/87 (!) 93/59  Pulse: 94 82 85 66  Resp: 14 16 16 18  $ Temp:  97.8 F (36.6 C) 98 F (36.7 C) 98.2 F (36.8 C)  TempSrc:  Oral Oral Oral  SpO2: 92% 95% 92% 90%  Weight:      Height:        Sensation intact distally Intact pulses distally Dorsiflexion/Plantar flexion intact Incision: dressing C/D/I Compartment soft    Lab Results  Component Value Date   WBC 14.7 (H) 03/08/2022   HGB 11.3 (L) 03/08/2022   HCT 36.8 03/08/2022   MCV 92.9 03/08/2022   PLT 221 03/08/2022   BMET    Component Value Date/Time   NA 134 (L) 03/08/2022 0308   NA 143 06/10/2013 0209   K 4.6 03/08/2022 0308   K 3.7 06/10/2013 0209   CL 104 03/08/2022 0308   CL 109 (H) 06/10/2013 0209   CO2 25 03/08/2022 0308   CO2 29 06/10/2013 0209   GLUCOSE 148 (H) 03/08/2022 0308   GLUCOSE 90 06/10/2013 0209   BUN 16 03/08/2022 0308   BUN 14 06/10/2013 0209   CREATININE 0.87 03/08/2022 0308   CREATININE 0.87 06/10/2013 0209   CALCIUM 8.3 (L) 03/08/2022 0308   CALCIUM 8.9 06/10/2013 0209   GFRNONAA >60 03/08/2022 0308   GFRNONAA >60 06/10/2013 0209    Xray: Postop x-rays show revision total knee arthroplasty components good position no adverse features  Assessment/Plan: 1 Day Post-Op   Principal Problem:   Prosthetic joint implant failure, initial encounter ([place])  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 Imaging: PACU xrays DVT prophylaxis: Aspirin 20m BID x4 weeks Follow up: 2 weeks after surgery for a  wound check [facility] Dr[facility]  [address] [place] [address] Office Phone: [phone] DWillaim Sheng2/29/2024, 6:20 AM   DCharlies Constable MD  Contact information:   [phone]-5pm epic message Dr. MZachery Dakins or call office for patient follow up: [phone] After hours and holidays please check [email or web address] for group call information for [facility]

## 2022-03-08 NOTE — TOC Initial Note (Signed)
Transition of Care [facility]) - Initial/Assessment Note    Patient Details  Name: Brenda Craig MRN: [id number] Date of Birth: 31-Oct-1960  [facility] CM/SW Contact:    Lennart Pall, LCSW Phone Number: 03/08/2022, 9:44 AM  Clinical Narrative:                  Met with pt and confirming she has needed DME at home.  HHPT prearranged with [facility] via MD office.  No further TOC needs.   Barriers to Discharge: No Barriers Identified   Patient Goals and CMS Choice Patient states their goals for this hospitalization and ongoing recovery are:: return home          Expected Discharge Plan and Services                         DME Arranged: N/A DME Agency: NA       HH Arranged: PT HH Agency: [facility]        Prior Living Arrangements/Services                       Activities of Daily Living Home Assistive Devices/Equipment: CPAP, Eyeglasses, Cane (specify quad or straight), Walker (specify type) ADL Screening (condition at time of admission) Patient's cognitive ability adequate to safely complete daily activities?: Yes Is the patient deaf or have difficulty hearing?: No Does the patient have difficulty seeing, even when wearing glasses/contacts?: No Does the patient have difficulty concentrating, remembering, or making decisions?: No Patient able to express need for assistance with ADLs?: Yes Does the patient have difficulty dressing or bathing?: No Independently performs ADLs?: Yes (appropriate for developmental age) Does the patient have difficulty walking or climbing stairs?: Yes Weakness of Legs: Left Weakness of Arms/Hands: None  Permission Sought/Granted                  Emotional Assessment              Admission diagnosis:  Prosthetic joint implant failure, initial encounter ([place]) [T84.019A] Patient Active Problem List   Diagnosis Date Noted   Prosthetic joint implant failure, initial encounter ([place])  03/07/2022   Allergic contact dermatitis due to other agents 12/11/2021   Low blood pressure reading 12/11/2021   Moderate persistent asthma 12/11/2021   Other allergic rhinitis 12/11/2021   Iron deficiency 06/09/2021   Severe persistent asthma 03/01/2021   OSA (obstructive sleep apnea) 05/10/2020   Chronic respiratory failure with hypoxia ([place]) 05/10/2020   Pneumonia 05/10/2020   Hx of respiratory failure 01/21/2020   S/P total knee arthroplasty, left 01/19/2020   AKI (acute kidney injury) ([place])    Obstruction of left ureter    Severe sepsis ([place]) 12/13/2018   Septic shock ([place]) 12/13/2018   OA (osteoarthritis) of knee 05/21/2016   Atypical chest pain 12/20/2015   Diarrhea    Chronic cough 03/28/2015   Colon polyp 03/28/2015   DU (duodenal ulcer) 03/28/2015   Hemorrhoid 03/28/2015   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/28/2015   Calculus of kidney 03/28/2015   Kidney cysts 03/28/2015   Displacement of lumbar intervertebral disc without myelopathy 02/14/2015   Angina pectoris ([place])    CAD in native artery    Back pain    S/P coronary artery stent placement    Hyperlipidemia    Encounter for preprocedural cardiovascular examination 05/30/2014   Excess weight 08/11/2013   Celiac disease 07/22/2013  Transient blindness of right eye 05/25/2013   Acid reflux 01/17/2013   Airway hyperreactivity 01/17/2013   Anxiety 01/17/2013   Arteriosclerosis of coronary artery 01/17/2013   HLD (hyperlipidemia) 01/17/2013   BP (high blood pressure) 01/17/2013   Atherosclerosis of coronary artery 10/31/2012   Essential hypertension 10/31/2012   Hypercholesterolemia 10/31/2012   Presence of stent in anterior descending branch of left coronary artery 10/31/2012   Presence of stent in coronary artery 10/31/2012   PCP:  Albina Billet, MD Pharmacy:   [id number] [place]Phone: [phone]:  [phone]    Social Determinants of Health (SDOH) Social History: SDOH Screenings   Food Insecurity: No Food Insecurity (03/07/2022)  Housing: Low Risk  (03/07/2022)  Transportation Needs: No Transportation Needs (03/07/2022)  Utilities: Not At Risk (03/07/2022)  Depression (PHQ2-9): Low Risk  (04/06/2019)  Tobacco Use: Low Risk  (03/07/2022)   SDOH Interventions: Food Insecurity Interventions: Intervention Not Indicated Housing Interventions: Intervention Not Indicated Transportation Interventions: Intervention Not Indicated Utilities Interventions: Intervention Not Indicated   Readmission Risk Interventions    03/08/2022    9:40 AM  Readmission Risk Prevention Plan  Post Dischage Appt Complete  Medication Screening Complete  Transportation Screening Complete

## 2022-03-09 LAB — CBC
HCT: 35 % — ABNORMAL LOW (ref 36.0–46.0)
Hemoglobin: 10.6 g/dL — ABNORMAL LOW (ref 12.0–15.0)
MCH: 28.6 pg (ref 26.0–34.0)
MCHC: 30.3 g/dL (ref 30.0–36.0)
MCV: 94.6 fL (ref 80.0–100.0)
Platelets: 197 10*3/uL (ref 150–400)
RBC: 3.7 MIL/uL — ABNORMAL LOW (ref 3.87–5.11)
RDW: 14.2 % (ref 11.5–15.5)
WBC: 10.7 10*3/uL — ABNORMAL HIGH (ref 4.0–10.5)
nRBC: 0 % (ref 0.0–0.2)

## 2022-03-09 MED ORDER — HYDROMORPHONE HCL 2 MG PO TABS: 2.0000 mg | ORAL_TABLET | ORAL | 0 refills | Status: DC | PRN

## 2022-03-09 MED ORDER — ONDANSETRON HCL 4 MG PO TABS: 4.0000 mg | ORAL_TABLET | Freq: Three times a day (TID) | ORAL | 0 refills | Status: AC | PRN

## 2022-03-09 MED ORDER — HYDROMORPHONE HCL 2 MG PO TABS: 2.0000 mg | ORAL_TABLET | ORAL | 0 refills | Status: AC | PRN

## 2022-03-09 MED ORDER — CEFADROXIL 500 MG PO CAPS: 500.0000 mg | ORAL_CAPSULE | Freq: Two times a day (BID) | ORAL | 0 refills | Status: AC

## 2022-03-09 MED ORDER — METHOCARBAMOL 500 MG PO TABS: 500.0000 mg | ORAL_TABLET | Freq: Three times a day (TID) | ORAL | 0 refills | Status: AC | PRN

## 2022-03-09 MED ORDER — CVS ASPIRIN ADULT LOW DOSE 81 MG PO CHEW: 81.0000 mg | CHEWABLE_TABLET | Freq: Two times a day (BID) | ORAL | Status: AC

## 2022-03-09 NOTE — Progress Notes (Signed)
Physical Therapy Treatment Patient Details Name: Brenda Craig MRN: [id number] DOB: 07/04/1960 Today's Date: 03/09/2022   History of Present Illness Pt s/p L TKR revision and with hx of CHF, CKD, CAP, MVP, back surgery and L TKR (22)    PT Comments    Pt received at bathroom sink with RN and ambulated increased distance with good stability but requiring increased time and standing rest breaks to complete task.  Pt hopeful for dc home tomorrow.   Recommendations for follow up therapy are one component of a multi-disciplinary discharge planning process, led by the attending physician.  Recommendations may be updated based on patient status, additional functional criteria and insurance authorization.  Follow Up Recommendations  Follow physician's recommendations for discharge plan and follow up therapies     Assistance Recommended at Discharge Set up Supervision/Assistance  Patient can return home with the following A little help with walking and/or transfers;A little help with bathing/dressing/bathroom;Assist for transportation;Help with stairs or ramp for entrance;Assistance with cooking/housework   Equipment Recommendations  None recommended by PT    Recommendations for Other Services       Precautions / Restrictions Precautions Precautions: Knee;Fall Restrictions Weight Bearing Restrictions: No     Mobility  Bed Mobility Overal bed mobility: Needs Assistance Bed Mobility: Sit to Supine     Supine to sit: Min guard Sit to supine: Min assist   General bed mobility comments: min assist to manage L LE    Transfers Overall transfer level: Needs assistance Equipment used: Rolling walker (2 wheels) Transfers: Sit to/from Stand Sit to Stand: Min guard, Supervision           General transfer comment: cues for LE management and use of UEs to self assist    Ambulation/Gait Ambulation/Gait assistance: Min guard Gait Distance (Feet): 135 Feet Assistive device:  Rolling walker (2 wheels) Gait Pattern/deviations: Step-to pattern, Decreased step length - right, Decreased step length - left, Shuffle, Trunk flexed Gait velocity: decr     General Gait Details: Increased time with min cues for sequence, posture and position from [employer]             Wheelchair Mobility    Modified Rankin (Stroke Patients Only)       Balance Overall balance assessment: Needs assistance Sitting-balance support: No upper extremity supported, Feet supported Sitting balance-Leahy Scale: Good     Standing balance support: No upper extremity supported Standing balance-Leahy Scale: Fair                              Cognition Arousal/Alertness: Awake/alert Behavior During Therapy: WFL for tasks assessed/performed Overall Cognitive Status: Within Functional Limits for tasks assessed                                          Exercises Total Joint Exercises Ankle Circles/Pumps: AROM, Both, 15 reps, Supine Quad Sets: AROM, Both, 10 reps, Supine Heel Slides: AAROM, Left, 15 reps, Supine Straight Leg Raises: AAROM, AROM, Left, 10 reps, Supine    General Comments        Pertinent Vitals/Pain Pain Assessment Pain Assessment: 0-10 Pain Score: 6  Pain Location: L knee Pain Descriptors / Indicators: Aching, Sore Pain Intervention(s): Premedicated before session, Monitored during session, Limited activity within patient's tolerance    Home Living  Prior Function            PT Goals (current goals can now be found in the care plan section) Acute Rehab PT Goals Patient Stated Goal: Regain IND PT Goal Formulation: With patient Time For Goal Achievement: 03/15/22 Potential to Achieve Goals: Good Progress towards PT goals: Progressing toward goals    Frequency    7X/week      PT Plan Current plan remains appropriate    Co-evaluation              AM-PAC PT "6 Clicks"  Mobility   Outcome Measure  Help needed turning from your back to your side while in a flat bed without using bedrails?: A Little Help needed moving from lying on your back to sitting on the side of a flat bed without using bedrails?: A Little Help needed moving to and from a bed to a chair (including a wheelchair)?: A Little Help needed standing up from a chair using your arms (e.g., wheelchair or bedside chair)?: A Little Help needed to walk in hospital room?: A Little Help needed climbing 3-5 steps with a railing? : A Lot 6 Click Score: 17    End of Session Equipment Utilized During Treatment: Gait belt Activity Tolerance: Patient tolerated treatment well Patient left: in bed;with call bell/phone within reach;with bed alarm set;with nursing/sitter in room Nurse Communication: Mobility status PT Visit Diagnosis: Difficulty in walking, not elsewhere classified (R26.2);Pain Pain - Right/Left: Left Pain - part of body: Knee     Time: [id number] PT Time Calculation (min) (ACUTE ONLY): 24 min  Charges:  $Gait Training: 23-37 mins                     [place] Pager [phone] Office [phone]    [name] 03/09/2022, 2:35 PM

## 2022-03-09 NOTE — Progress Notes (Signed)
Physical Therapy Treatment Patient Details Name: Brenda Craig MRN: [id number] DOB: [birth year] Today's Date: 03/09/2022   History of Present Illness Pt s/p L TKR revision and with hx of CHF, CKD, CAP, MVP, back surgery and L TKR (22)    PT Comments    Pt continues motivated but progress limited by increased pain level.  Pt performed therex program and up to ambulate in hall but distance ltd.   Recommendations for follow up therapy are one component of a multi-disciplinary discharge planning process, led by the attending physician.  Recommendations may be updated based on patient status, additional functional criteria and insurance authorization.  Follow Up Recommendations  Follow physician's recommendations for discharge plan and follow up therapies     Assistance Recommended at Discharge Set up Supervision/Assistance  Patient can return home with the following A little help with walking and/or transfers;A little help with bathing/dressing/bathroom;Assist for transportation;Help with stairs or ramp for entrance;Assistance with cooking/housework   Equipment Recommendations  None recommended by PT    Recommendations for Other Services       Precautions / Restrictions Precautions Precautions: Knee;Fall Restrictions Weight Bearing Restrictions: No     Mobility  Bed Mobility Overal bed mobility: Needs Assistance Bed Mobility: Supine to Sit     Supine to sit: Min guard     General bed mobility comments: Increased time with pt self assisting L LE with R LE OOB    Transfers Overall transfer level: Needs assistance Equipment used: Rolling walker (2 wheels) Transfers: Sit to/from Stand Sit to Stand: Min guard, Supervision           General transfer comment: cues for LE management and use of UEs to self assist    Ambulation/Gait Ambulation/Gait assistance: Min guard Gait Distance (Feet): 75 Feet Assistive device: Rolling walker (2 wheels) Gait  Pattern/deviations: Step-to pattern, Decreased step length - right, Decreased step length - left, Shuffle, Trunk flexed Gait velocity: decr     General Gait Details: min cues for sequence, posture and position from [employer]             Wheelchair Mobility    Modified Rankin (Stroke Patients Only)       Balance Overall balance assessment: Needs assistance Sitting-balance support: No upper extremity supported, Feet supported Sitting balance-Leahy Scale: Good     Standing balance support: No upper extremity supported Standing balance-Leahy Scale: Fair                              Cognition Arousal/Alertness: Awake/alert Behavior During Therapy: WFL for tasks assessed/performed Overall Cognitive Status: Within Functional Limits for tasks assessed                                          Exercises Total Joint Exercises Ankle Circles/Pumps: AROM, Both, 15 reps, Supine Quad Sets: AROM, Both, 10 reps, Supine Heel Slides: AAROM, Left, 15 reps, Supine Straight Leg Raises: AAROM, AROM, Left, 10 reps, Supine    General Comments        Pertinent Vitals/Pain Pain Assessment Pain Assessment: No/denies pain Pain Score: 7  Pain Location: L knee Pain Descriptors / Indicators: Aching, Sore Pain Intervention(s): Limited activity within patient's tolerance, Monitored during session, Premedicated before session, Ice applied    Home Living  Prior Function            PT Goals (current goals can now be found in the care plan section) Acute Rehab PT Goals Patient Stated Goal: Regain IND PT Goal Formulation: With patient Time For Goal Achievement: 03/15/22 Potential to Achieve Goals: Good Progress towards PT goals: Progressing toward goals    Frequency    7X/week      PT Plan Current plan remains appropriate    Co-evaluation              AM-PAC PT "6 Clicks" Mobility   Outcome Measure   Help needed turning from your back to your side while in a flat bed without using bedrails?: A Little Help needed moving from lying on your back to sitting on the side of a flat bed without using bedrails?: A Little Help needed moving to and from a bed to a chair (including a wheelchair)?: A Little Help needed standing up from a chair using your arms (e.g., wheelchair or bedside chair)?: A Little Help needed to walk in hospital room?: A Little Help needed climbing 3-5 steps with a railing? : A Lot 6 Click Score: 17    End of Session Equipment Utilized During Treatment: Gait belt Activity Tolerance: Patient tolerated treatment well Patient left: in chair;with call bell/phone within reach;with chair alarm set Nurse Communication: Mobility status PT Visit Diagnosis: Difficulty in walking, not elsewhere classified (R26.2);Pain Pain - Right/Left: Left Pain - part of body: Knee     Time: [id number] PT Time Calculation (min) (ACUTE ONLY): 27 min  Charges:  $Gait Training: 8-22 mins $Therapeutic Exercise: 8-22 mins                     [place] Pager [phone] Office [phone]    [facility] 03/09/2022, 8:58 AM

## 2022-03-09 NOTE — Progress Notes (Signed)
     Subjective: Patient did excellent Yesterday with therapy.  Pain has been very well-controlled until yesterday evening when the block wore off.  She reports having a rough night.  Will see how pain control goes today as well as mobility.  Discharge home today versus tomorrow.  Objective:   VITALS:   Vitals:   03/08/22 0950 03/08/22 1355 03/08/22 2140 03/09/22 0520  BP: 110/67 99/74 105/64 116/64  Pulse: 64 73 62 (!) 59  Resp: '18 17 16 16  '$ Temp: 98.1 F (36.7 C) 98.6 F (37 C) 98.6 F (37 C) 98.7 F (37.1 C)  TempSrc:   Oral Oral  SpO2: 92% 95% 94% 90%  Weight:      Height:        Sensation intact distally Intact pulses distally Dorsiflexion/Plantar flexion intact Incision: dressing C/D/I Compartment soft Woudn vac holding suction: 50cc bloody output in cannister    Lab Results  Component Value Date   WBC 10.7 (H) 03/09/2022   HGB 10.6 (L) 03/09/2022   HCT 35.0 (L) 03/09/2022   MCV 94.6 03/09/2022   PLT 197 03/09/2022   BMET    Component Value Date/Time   NA 134 (L) 03/08/2022 0308   NA 143 06/10/2013 0209   K 4.6 03/08/2022 0308   K 3.7 06/10/2013 0209   CL 104 03/08/2022 0308   CL 109 (H) 06/10/2013 0209   CO2 25 03/08/2022 0308   CO2 29 06/10/2013 0209   GLUCOSE 148 (H) 03/08/2022 0308   GLUCOSE 90 06/10/2013 0209   BUN 16 03/08/2022 0308   BUN 14 06/10/2013 0209   CREATININE 0.87 03/08/2022 0308   CREATININE 0.87 06/10/2013 0209   CALCIUM 8.3 (L) 03/08/2022 0308   CALCIUM 8.9 06/10/2013 0209   GFRNONAA >60 03/08/2022 0308   GFRNONAA >60 06/10/2013 0209    Xray: Postop x-rays show revision total knee arthroplasty components good position no adverse features  Assessment/Plan: 2 Days Post-Op   Principal Problem:   Prosthetic joint implant failure, initial encounter ([place])  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 Imaging: PACU xrays DVT prophylaxis: Aspirin '81mg'$  BID x4 weeks Follow up: 2 weeks after surgery for a wound check with Dr. Zachery Dakins at [facility].  Address: [address] [place], [place], [place] [address]  Office Phone: [phone]  Willaim Sheng 03/09/2022, 6:15 AM   Charlies Constable, MD  Contact information:   [phone] 7am-5pm epic message Dr. Zachery Dakins, or call office for patient follow up: [phone] After hours and holidays please check [email or web address] for group call information for [facility]

## 2022-03-10 NOTE — Plan of Care (Signed)
  Problem: Education: Goal: Knowledge of General Education information will improve Description: Including pain rating scale, medication(s)/side effects and non-pharmacologic comfort measures Outcome: Progressing   Problem: Health Behavior/Discharge Planning: Goal: Ability to manage health-related needs will improve Outcome: Progressing   Problem: Activity: Goal: Risk for activity intolerance will decrease Outcome: Progressing   

## 2022-03-10 NOTE — Discharge Summary (Signed)
Orthopaedic Trauma Service (OTS) Discharge Summary   Patient ID: Brenda Craig MRN: [id number] DOB/AGE: 04-01-1960 62 y.o.  Admit date: 03/07/2022 Discharge date: 03/10/2022  Admission Diagnoses: Left knee prosthetic joint implant failure  Discharge Diagnoses:  Principal Problem:   Prosthetic joint implant failure, initial encounter [facility])   Past Medical History:  Diagnosis Date   Abnormal Pap smear of cervix 1988   Acid reflux 01/17/2013   Allergic rhinitis    Anemia    Anginal pain (HCC)    Anxiety    Arthritis    Asthma    Bronchiectasis ([place])    Celiac disease    Cervical dysplasia 1988   CHF (congestive heart failure) (HCC)    Chronic kidney disease    Complication of anesthesia    bp drops after anesthesia and has to be kept overnight   Coronary artery disease    Headache    migraine with visual aura   Heart trouble    History of cardiac arrest    History of hiatal hernia    History of kidney stones    History of mammogram 03/25/2013; 12/07/14   BIRADS 1; NEG   History of Papanicolaou smear of cervix 03/20/12; 12/28/15   -/-; -/-   Hypercholesteremia    Hypertension    IBS (irritable bowel syndrome)    Menopausal symptoms    MI (myocardial infarction) ([place])    Mitral valve prolapse    Pneumonia    PONV (postoperative nausea and vomiting)    nausea only   Sleep apnea    Vulvovaginitis    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  Was taken to the operating room by Dr. Zachery Dakins for the above procedure under regional and general anesthesia.  She tolerated this well without complications.  Was admitted to the orthopedic service postoperatively for pain control and therapies.  Was started on aspirin for DVT prophylaxis starting on  postoperative day #1.  Patient began working with physical and Occupational Therapy starting on postoperative day #1.  Did extremely well with this and had great mobility.  Unfortunately on postoperative day #2, patient had significant pain once her regional nerve block wore off.  Progression with therapies on this day was slightly limited secondary to pain.  The decision was made to keep patient in the inpatient setting for 1 additional day for continued therapies and to focus on pain control.   On 03/10/2022, the patient was tolerating diet, working well with therapies, pain well controlled, vital signs stable, dressings clean, dry, intact and felt stable for discharge to home. Patient will follow up as below and knows to call with questions or concerns.     Consults: None  Significant Diagnostic Studies:   Results for orders placed or performed during the hospital encounter of 03/07/22 (from the past 168 hour(s))  ABO/Rh   Collection Time: 03/07/22 11:23 AM  Result Value Ref Range   ABO/RH(D)      O POS Performed at [facility], [place] [address] [place], [place] [address]   Aerobic/Anaerobic Culture w Gram Stain (surgical/deep wound)   Collection Time: 03/07/22 12:55 PM   Specimen: Wound  Result Value Ref Range   Specimen Description TISSUE    Special Requests LEFT KNEE    Gram Stain  RARE WBC PRESENT, PREDOMINANTLY MONONUCLEAR NO ORGANISMS SEEN    Culture      NO GROWTH 3 DAYS NO ANAEROBES ISOLATED; CULTURE IN PROGRESS FOR 5 DAYS Performed at [place] [address], [place], [place] [address]    Report Status PENDING   Aerobic/Anaerobic Culture w Gram Stain (surgical/deep wound)   Collection Time: 03/07/22 12:55 PM   Specimen: Wound  Result Value Ref Range   Specimen Description TISSUE    Special Requests LEFT KNEE    Gram Stain NO WBC SEEN NO ORGANISMS SEEN     Culture      NO GROWTH 3 DAYS NO ANAEROBES ISOLATED; CULTURE IN PROGRESS FOR 5  DAYS Performed at [facility], [place] [address], [place], [place] [address]    Report Status PENDING   Aerobic/Anaerobic Culture w Gram Stain (surgical/deep wound)   Collection Time: 03/07/22 12:55 PM   Specimen: Wound  Result Value Ref Range   Specimen Description TISSUE    Special Requests LEFT KNEE    Gram Stain NO WBC SEEN NO ORGANISMS SEEN     Culture      NO GROWTH 3 DAYS NO ANAEROBES ISOLATED; CULTURE IN PROGRESS FOR 5 DAYS Performed at [facility], [place] [address] [place], [place] [address]    Report Status PENDING   CBC   Collection Time: 03/08/22  3:08 AM  Result Value Ref Range   WBC 14.7 (H) 4.0 - 10.5 K/uL   RBC 3.96 3.87 - 5.11 MIL/uL   Hemoglobin 11.3 (L) 12.0 - 15.0 g/dL   HCT 36.8 36.0 - 46.0 %   MCV 92.9 80.0 - 100.0 fL   MCH 28.5 26.0 - 34.0 pg   MCHC 30.7 30.0 - 36.0 g/dL   RDW 13.6 11.5 - 15.5 %   Platelets 221 150 - 400 K/uL   nRBC 0.0 0.0 - 0.2 %  Basic metabolic panel   Collection Time: 03/08/22  3:08 AM  Result Value Ref Range   Sodium 134 (L) 135 - 145 mmol/L   Potassium 4.6 3.5 - 5.1 mmol/L   Chloride 104 98 - 111 mmol/L   CO2 25 22 - 32 mmol/L   Glucose, Bld 148 (H) 70 - 99 mg/dL   BUN 16 8 - 23 mg/dL   Creatinine, Ser 0.87 0.44 - 1.00 mg/dL   Calcium 8.3 (L) 8.9 - 10.3 mg/dL   GFR, Estimated >60 >60 mL/min   Anion gap 5 5 - 15  CBC   Collection Time: 03/09/22  4:15 AM  Result Value Ref Range   WBC 10.7 (H) 4.0 - 10.5 K/uL   RBC 3.70 (L) 3.87 - 5.11 MIL/uL   Hemoglobin 10.6 (L) 12.0 - 15.0 g/dL   HCT 35.0 (L) 36.0 - 46.0 %   MCV 94.6 80.0 - 100.0 fL   MCH 28.6 26.0 - 34.0 pg   MCHC 30.3 30.0 - 36.0 g/dL   RDW 14.2 11.5 - 15.5 %   Platelets 197 150 - 400 K/uL   nRBC 0.0 0.0 - 0.2 %     Treatments: IV hydration, antibiotics: Ancef, analgesia: acetaminophen, Dilaudid, and oxycodone, therapies: PT and OT, and surgery: As above  Discharge Exam: General: Sitting up in bed, no acute distress.  Pleasant and cooperative   Respiratory: No increased work of breathing at rest  Left lower extremity:  - Sensation intact distally - Intact pulses distally - Dorsiflexion/Plantar flexion intact - Incision: dressing C/D/I - Compartment soft -  Wound vac holding suction: 50cc bloody output in cannister    Disposition: Discharge disposition: 01-Home or Self Care       Discharge Instructions     Call MD / Call 911   Complete by: As directed    If you experience chest pain or shortness of breath, CALL 911 and be transported to the hospital emergency room.  If you develope a fever above 101 F, pus (white drainage) or increased drainage or redness at the wound, or calf pain, call your surgeon's office.   Constipation Prevention   Complete by: As directed    Drink plenty of fluids.  Prune juice may be helpful.  You may use a stool softener, such as Colace (over the counter) 100 mg twice a day.  Use MiraLax (over the counter) for constipation as needed.   Diet - low sodium heart healthy   Complete by: As directed    Do not put a pillow under the knee. Place it under the heel.   Complete by: As directed    Increase activity slowly as tolerated   Complete by: As directed    Post-operative opioid taper instructions:   Complete by: As directed    POST-OPERATIVE OPIOID TAPER INSTRUCTIONS: It is important to wean off of your opioid medication as soon as possible. If you do not need pain medication after your surgery it is ok to stop day one. Opioids include: Codeine, Hydrocodone(Norco, Vicodin), Oxycodone(Percocet, oxycontin) and hydromorphone amongst others.  Long term and even short term use of opiods can cause: Increased pain response Dependence Constipation Depression Respiratory depression And more.  Withdrawal symptoms can include Flu like symptoms Nausea, vomiting And more Techniques to manage these symptoms Hydrate well Eat regular healthy meals Stay active Use relaxation techniques(deep  breathing, meditating, yoga) Do Not substitute Alcohol to help with tapering If you have been on opioids for less than two weeks and do not have pain than it is ok to stop all together.  Plan to wean off of opioids This plan should start within one week post op of your joint replacement. Maintain the same interval or time between taking each dose and first decrease the dose.  Cut the total daily intake of opioids by one tablet each day Next start to increase the time between doses. The last dose that should be eliminated is the evening dose.         Allergies as of 03/10/2022       Reactions   Other Other (See Comments)   Gluten allergy Celiac    Codeine Nausea And Vomiting, Rash   Esomeprazole Magnesium Palpitations, Other (See Comments)   IRREGULAR HEARTBEAT   Iodinated Contrast Media Itching   Omeprazole Magnesium Palpitations, Other (See Comments)   IRREGULAR HEARTBEAT   Oxycodone-acetaminophen Itching, Nausea And Vomiting, Rash   Can take with benadryl   Percocet [oxycodone-acetaminophen] Itching, Nausea And Vomiting, Rash   Can take with benadryl    Tramadol Rash   Can take with benadryl         Medication List     TAKE these medications    acetaminophen 500 MG tablet Commonly known as: TYLENOL Take 1,000 mg by mouth as needed for moderate pain or headache.   albuterol 108 (90 Base) MCG/ACT inhaler Commonly known as: VENTOLIN HFA Inhale 2 puffs into the lungs as needed for wheezing or shortness of breath. What changed: Another medication with the same name was changed. Make sure you understand how and when to  take each.   albuterol (2.5 MG/3ML) 0.083% nebulizer solution Commonly known as: PROVENTIL Take 3 mLs (2.5 mg total) by nebulization every 4 (four) hours as needed for wheezing or shortness of breath (coughing fits). What changed: when to take this   amoxicillin 500 MG capsule Commonly known as: AMOXIL Take 2,000 mg by mouth See admin instructions.  Before dental appointment   azelastine 0.1 % nasal spray Commonly known as: ASTELIN Place 2 sprays into both nostrils 2 (two) times daily. Use in each nostril as directed   Benadryl Allergy 25 MG tablet Generic drug: diphenhydrAMINE Take 25 mg by mouth as needed for itching.   budesonide-formoterol 80-4.5 MCG/ACT inhaler Commonly known as: Symbicort Inhale 2 puffs into the lungs in the morning and at bedtime. with spacer and rinse mouth afterwards.   cefadroxil 500 MG capsule Commonly known as: DURICEF Take 1 capsule (500 mg total) by mouth 2 (two) times daily for 7 days.   clotrimazole-betamethasone cream Commonly known as: LOTRISONE APPLY TO AFFECTED AREA TWICE A DAY AS NEEDED FOR SYMPTOMS FOR UP TO 2 WEEKS What changed:  how much to take how to take this when to take this reasons to take this additional instructions   [employer] Aspirin Adult Low Dose 81 MG chewable tablet Generic drug: aspirin Chew 1 tablet (81 mg total) by mouth in the morning and at bedtime. What changed: when to take this   dicyclomine 10 MG capsule Commonly known as: BENTYL Take 10 mg by mouth as needed for spasms.   famotidine 40 MG tablet Commonly known as: PEPCID Take 40 mg by mouth at bedtime.   fluticasone 50 MCG/ACT nasal spray Commonly known as: FLONASE Place 2 sprays into both nostrils daily.   HYDROmorphone 2 MG tablet Commonly known as: Dilaudid Take 1 tablet (2 mg total) by mouth every 4 (four) hours as needed for up to 7 days for severe pain.   LORazepam 1 MG tablet Commonly known as: ATIVAN Take 1 mg by mouth 2 (two) times daily.   losartan 25 MG tablet Commonly known as: COZAAR TAKE 1 TABLET (25 MG TOTAL) BY MOUTH DAILY.   Melatonin 5 MG Caps Take 5 mg by mouth at bedtime.   methocarbamol 500 MG tablet Commonly known as: ROBAXIN Take 1 tablet (500 mg total) by mouth every 8 (eight) hours as needed for up to 10 days for muscle spasms.   montelukast 10 MG tablet Commonly  known as: SINGULAIR Take 10 mg by mouth daily.   nebivolol 5 MG tablet Commonly known as: BYSTOLIC Take 1 tablet (5 mg total) by mouth daily with supper.   Nitrostat 0.4 MG SL tablet Generic drug: nitroGLYCERIN Place 0.4 mg under the tongue every 5 (five) minutes as needed for chest pain.   nystatin cream Commonly known as: MYCOSTATIN Apply 1 Application topically as needed (rash).   ondansetron 4 MG tablet Commonly known as: Zofran Take 1 tablet (4 mg total) by mouth every 8 (eight) hours as needed for up to 14 days for nausea or vomiting.   pantoprazole 20 MG tablet Commonly known as: PROTONIX Take 40 mg by mouth daily.   rosuvastatin 10 MG tablet Commonly known as: CRESTOR Take 10 mg by mouth at bedtime.   triamcinolone cream 0.1 % Commonly known as: KENALOG Apply 1 Application topically as needed (break out).   TUSSIDEX PO Take 5 mLs by mouth as needed (cough).        [facility].   Specialty: [place] Why: to provide home physical therapy visits Contact information: [place] [address] [phone]         Willaim Sheng, MD Follow up on 03/15/2022.   Specialty: Orthopedic Surgery Contact information: [place] [address] [phone]                 Discharge Instructions and Plan: Patient will be discharged to home. Will be discharged on Aspirin for DVT prophylaxis.  Will continue weightbearing as tolerated on the left lower extremity.  Home health therapies have been arranged.  Patient will be transition to portable Prevena wound VAC at discharge.  Patient has been provided with all the necessary DME for discharge. Patient will follow up with Dr. Zachery Dakins on Thursday 03/15/2022 for wound check and wound VAC removal.  Signed:  Gwinda Passe, PA-C ?[phone]? (phone) 03/10/2022, 1:58 PM  [facility] [place] [address] [phone] Domingo Sep (F)

## 2022-03-10 NOTE — Progress Notes (Signed)
The patient is alert and oriented and has been seen by her physician. The orders for discharge were written. IV has been removed. Patient has been switched to the [employer] wound vac. Went over discharge instructions with patient. She is being discharged via wheelchair with all of her belongings.

## 2022-03-10 NOTE — Progress Notes (Signed)
Physical Therapy Treatment Patient Details Name: Brenda Craig MRN: [id number] DOB: 05-16-1960 Today's Date: 03/10/2022   History of Present Illness Pt s/p L TKR revision and with hx of CHF, CKD, CAP, MVP, back surgery and L TKR (22)    PT Comments    Pt continues motivated and progressing well with mobility with noted slow improvement in pain control. Pt up to ambulate in hall, negotiated stairs and performed therex program.  Pt states more comfortable with dc home this date.   Recommendations for follow up therapy are one component of a multi-disciplinary discharge planning process, led by the attending physician.  Recommendations may be updated based on patient status, additional functional criteria and insurance authorization.  Follow Up Recommendations  Follow physician's recommendations for discharge plan and follow up therapies     Assistance Recommended at Discharge Set up Supervision/Assistance  Patient can return home with the following A little help with walking and/or transfers;A little help with bathing/dressing/bathroom;Assist for transportation;Help with stairs or ramp for entrance;Assistance with cooking/housework   Equipment Recommendations  None recommended by PT    Recommendations for Other Services       Precautions / Restrictions Precautions Precautions: Knee;Fall Restrictions Weight Bearing Restrictions: No     Mobility  Bed Mobility               General bed mobility comments: Pt received sitting EOB with nursing and requests to recliner at session end    Transfers Overall transfer level: Needs assistance Equipment used: Rolling walker (2 wheels) Transfers: Sit to/from Stand Sit to Stand: Supervision           General transfer comment: cues for LE management and use of UEs to self assist    Ambulation/Gait Ambulation/Gait assistance: Min guard, Supervision Gait Distance (Feet): 100 Feet Assistive device: Rolling walker (2  wheels) Gait Pattern/deviations: Step-to pattern, Decreased step length - right, Decreased step length - left, Shuffle, Trunk flexed Gait velocity: decr     General Gait Details: Increased time with min cues for sequence, posture and position from RW   Stairs Stairs: Yes Stairs assistance: Min guard Stair Management: Two rails, Step to pattern, Forwards Number of Stairs: 3 General stair comments: cues for sequence   Wheelchair Mobility    Modified Rankin (Stroke Patients Only)       Balance Overall balance assessment: Needs assistance Sitting-balance support: No upper extremity supported, Feet supported Sitting balance-Leahy Scale: Good     Standing balance support: No upper extremity supported Standing balance-Leahy Scale: Fair                              Cognition Arousal/Alertness: Awake/alert Behavior During Therapy: WFL for tasks assessed/performed Overall Cognitive Status: Within Functional Limits for tasks assessed                                          Exercises Total Joint Exercises Ankle Circles/Pumps: AROM, Both, 15 reps, Supine Quad Sets: AROM, Both, 10 reps, Supine Heel Slides: AAROM, Left, 15 reps, Supine Straight Leg Raises: AAROM, AROM, Left, Supine, 15 reps    General Comments        Pertinent Vitals/Pain Pain Assessment Pain Assessment: 0-10 Pain Score: 6  Pain Location: L knee Pain Descriptors / Indicators: Aching, Sore Pain Intervention(s): Limited activity within patient's tolerance, Monitored during session,  Premedicated before session, Ice applied    Home Living                          Prior Function            PT Goals (current goals can now be found in the care plan section) Acute Rehab PT Goals Patient Stated Goal: Regain IND PT Goal Formulation: With patient Time For Goal Achievement: 03/15/22 Potential to Achieve Goals: Good Progress towards PT goals: Progressing toward  goals    Frequency    7X/week      PT Plan Current plan remains appropriate    Co-evaluation              AM-PAC PT "6 Clicks" Mobility   Outcome Measure  Help needed turning from your back to your side while in a flat bed without using bedrails?: A Little Help needed moving from lying on your back to sitting on the side of a flat bed without using bedrails?: A Little Help needed moving to and from a bed to a chair (including a wheelchair)?: A Little Help needed standing up from a chair using your arms (e.g., wheelchair or bedside chair)?: A Little Help needed to walk in hospital room?: A Little Help needed climbing 3-5 steps with a railing? : A Little 6 Click Score: 18    End of Session Equipment Utilized During Treatment: Gait belt Activity Tolerance: Patient tolerated treatment well Patient left: in chair;with call bell/phone within reach Nurse Communication: Mobility status PT Visit Diagnosis: Difficulty in walking, not elsewhere classified (R26.2);Pain Pain - Right/Left: Left Pain - part of body: Knee     Time: [id number] PT Time Calculation (min) (ACUTE ONLY): 32 min  Charges:  $Gait Training: 8-22 mins $Therapeutic Exercise: 8-22 mins                     [place] Pager [phone] Office [phone]    Candance Bohlman 03/10/2022, 10:52 AM

## 2022-03-10 NOTE — Progress Notes (Signed)
ORTHO TRAUMA PROGRESS NOTE   Subjective: Doing fairly well this morning. Still having pain in the left knee as expected. Just received some pain medication and plans to work with therapies shortly. Daughter was able to get get d/c meds form the pharmacy yesterday. Patient feels ready for discharge home today.   Objective:   VITALS:   Vitals:   03/09/22 1353 03/09/22 2218 03/10/22 0605 03/10/22 0819  BP: 104/75 107/70 111/71 128/77  Pulse: 70 66 73 74  Resp: '20 20 18   '$ Temp: 98.2 F (36.8 C) 98.8 F (37.1 C) 98.1 F (36.7 C)   TempSrc: Oral  Oral   SpO2: 94% 90% 90%   Weight:      Height:        Sensation intact distally Intact pulses distally Dorsiflexion/Plantar flexion intact Incision: dressing C/D/I Compartment soft Wound vac holding suction: 50cc bloody output in cannister    Lab Results  Component Value Date   WBC 10.7 (H) 03/09/2022   HGB 10.6 (L) 03/09/2022   HCT 35.0 (L) 03/09/2022   MCV 94.6 03/09/2022   PLT 197 03/09/2022   BMET    Component Value Date/Time   NA 134 (L) 03/08/2022 0308   NA 143 06/10/2013 0209   K 4.6 03/08/2022 0308   K 3.7 06/10/2013 0209   CL 104 03/08/2022 0308   CL 109 (H) 06/10/2013 0209   CO2 25 03/08/2022 0308   CO2 29 06/10/2013 0209   GLUCOSE 148 (H) 03/08/2022 0308   GLUCOSE 90 06/10/2013 0209   BUN 16 03/08/2022 0308   BUN 14 06/10/2013 0209   CREATININE 0.87 03/08/2022 0308   CREATININE 0.87 06/10/2013 0209   CALCIUM 8.3 (L) 03/08/2022 0308   CALCIUM 8.9 06/10/2013 0209   GFRNONAA >60 03/08/2022 0308   GFRNONAA >60 06/10/2013 0209    Xray: Postop x-rays show revision total knee arthroplasty components good position no adverse features  Assessment/Plan: 3 Days Post-Op   Principal Problem:   Prosthetic joint implant failure, initial encounter ([place])  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 Imaging: PACU xrays DVT prophylaxis: Aspirin '81mg'$  BID x4 weeks Follow up: 03/15/22 for a wound check with Dr. Zachery Dakins at [facility].  Address: [address], [place],  [address]  Office Phone: [phone]  Gwinda Passe PA-C [facility] [phone] (office) [email or web address]   Contact information:   Weekdays 7am-5pm epic message Dr. Zachery Dakins, or call office for patient follow up: [phone] After hours and holidays please check [email or web address] for group call information for [facility]

## 2022-03-12 LAB — AEROBIC/ANAEROBIC CULTURE W GRAM STAIN (SURGICAL/DEEP WOUND)
Culture: NO GROWTH
Culture: NO GROWTH
Culture: NO GROWTH
Gram Stain: NONE SEEN
Gram Stain: NONE SEEN

## 2022-04-27 ENCOUNTER — Telehealth: Payer: Self-pay | Admitting: Primary Care

## 2022-04-27 DIAGNOSIS — J9611 Chronic respiratory failure with hypoxia: Secondary | ICD-10-CM

## 2022-04-27 DIAGNOSIS — J4550 Severe persistent asthma, uncomplicated: Secondary | ICD-10-CM

## 2022-04-27 NOTE — Telephone Encounter (Signed)
Patient is aware of results and voiced her understanding.  She wished to d/c oxygen. She will continue CPAP. Order placed to [facility] to d/c oxygen. Nothing further needed.

## 2022-04-27 NOTE — Telephone Encounter (Signed)
Patient had Brenda Craig on CPAP 03/05/2022 that showed SpO2 low 85% with average oxygen 91%. Patient spent 5 minutes with SpO2 less than or equal to 88%.  This is borderline for oxygen requirements.  Patient would like to discontinue, agree it is okay.  Continue CPAP nightly 4 to 6 hours or longer.  Please send order to discontinue oxygen

## 2022-05-09 ENCOUNTER — Ambulatory Visit: Payer: BC Managed Care – PPO | Admitting: Physician Assistant

## 2022-06-01 ENCOUNTER — Encounter: Payer: Self-pay | Admitting: Primary Care

## 2022-06-12 ENCOUNTER — Other Ambulatory Visit: Payer: BC Managed Care – PPO

## 2022-06-12 ENCOUNTER — Ambulatory Visit: Payer: BC Managed Care – PPO | Admitting: Oncology

## 2022-06-13 ENCOUNTER — Telehealth: Payer: Self-pay | Admitting: Primary Care

## 2022-06-13 NOTE — Telephone Encounter (Signed)
Pt. Called need to get order signed to get O2 back to [facility] to come pick up

## 2022-06-13 NOTE — Telephone Encounter (Signed)
Order was placed on 04/27/2022. Can you check on this? Thank you!

## 2022-06-13 NOTE — Telephone Encounter (Signed)
I have refaxed the order to [facility] and I will call in the morning to make sure they got it this time

## 2022-06-19 NOTE — Telephone Encounter (Signed)
I have spoke with Monisha with [employer] and she confirmed that they have this order to pick up 02

## 2022-06-22 ENCOUNTER — Encounter: Payer: Self-pay | Admitting: Oncology

## 2022-06-22 ENCOUNTER — Inpatient Hospital Stay (HOSPITAL_BASED_OUTPATIENT_CLINIC_OR_DEPARTMENT_OTHER): Payer: BC Managed Care – PPO | Admitting: Oncology

## 2022-06-22 ENCOUNTER — Inpatient Hospital Stay: Payer: BC Managed Care – PPO | Attending: Oncology

## 2022-06-22 VITALS — BP 97/65 | HR 70 | Temp 97.8°F | Resp 18 | Wt 233.0 lb

## 2022-06-22 DIAGNOSIS — Z8051 Family history of malignant neoplasm of kidney: Secondary | ICD-10-CM | POA: Diagnosis not present

## 2022-06-22 DIAGNOSIS — K900 Celiac disease: Secondary | ICD-10-CM | POA: Insufficient documentation

## 2022-06-22 DIAGNOSIS — E611 Iron deficiency: Secondary | ICD-10-CM

## 2022-06-22 DIAGNOSIS — Z8349 Family history of other endocrine, nutritional and metabolic diseases: Secondary | ICD-10-CM | POA: Diagnosis not present

## 2022-06-22 DIAGNOSIS — G4730 Sleep apnea, unspecified: Secondary | ICD-10-CM | POA: Insufficient documentation

## 2022-06-22 DIAGNOSIS — I10 Essential (primary) hypertension: Secondary | ICD-10-CM | POA: Insufficient documentation

## 2022-06-22 DIAGNOSIS — Z823 Family history of stroke: Secondary | ICD-10-CM | POA: Insufficient documentation

## 2022-06-22 DIAGNOSIS — R21 Rash and other nonspecific skin eruption: Secondary | ICD-10-CM | POA: Diagnosis not present

## 2022-06-22 DIAGNOSIS — Z90721 Acquired absence of ovaries, unilateral: Secondary | ICD-10-CM | POA: Diagnosis not present

## 2022-06-22 DIAGNOSIS — Z8249 Family history of ischemic heart disease and other diseases of the circulatory system: Secondary | ICD-10-CM | POA: Diagnosis not present

## 2022-06-22 DIAGNOSIS — Z833 Family history of diabetes mellitus: Secondary | ICD-10-CM | POA: Insufficient documentation

## 2022-06-22 DIAGNOSIS — I252 Old myocardial infarction: Secondary | ICD-10-CM | POA: Insufficient documentation

## 2022-06-22 DIAGNOSIS — Z87442 Personal history of urinary calculi: Secondary | ICD-10-CM | POA: Insufficient documentation

## 2022-06-22 DIAGNOSIS — Z8049 Family history of malignant neoplasm of other genital organs: Secondary | ICD-10-CM | POA: Insufficient documentation

## 2022-06-22 DIAGNOSIS — Z8674 Personal history of sudden cardiac arrest: Secondary | ICD-10-CM | POA: Diagnosis not present

## 2022-06-22 DIAGNOSIS — Z885 Allergy status to narcotic agent status: Secondary | ICD-10-CM | POA: Insufficient documentation

## 2022-06-22 DIAGNOSIS — Z79899 Other long term (current) drug therapy: Secondary | ICD-10-CM | POA: Diagnosis not present

## 2022-06-22 DIAGNOSIS — J45909 Unspecified asthma, uncomplicated: Secondary | ICD-10-CM | POA: Diagnosis not present

## 2022-06-22 DIAGNOSIS — I2510 Atherosclerotic heart disease of native coronary artery without angina pectoris: Secondary | ICD-10-CM | POA: Insufficient documentation

## 2022-06-22 LAB — RETIC PANEL
Immature Retic Fract: 8.3 % (ref 2.3–15.9)
RBC.: 4.62 MIL/uL (ref 3.87–5.11)
Retic Count, Absolute: 78.1 10*3/uL (ref 19.0–186.0)
Retic Ct Pct: 1.7 % (ref 0.4–3.1)
Reticulocyte Hemoglobin: 29.5 pg (ref >27.9)

## 2022-06-22 LAB — CBC WITH DIFFERENTIAL/PLATELET
Abs Immature Granulocytes: 0.04 10*3/uL (ref 0.00–0.07)
Basophils Absolute: 0.1 10*3/uL (ref 0.0–0.1)
Basophils Relative: 1 %
Eosinophils Absolute: 0.1 10*3/uL (ref 0.0–0.5)
Eosinophils Relative: 1 %
HCT: 40.1 % (ref 36.0–46.0)
Hemoglobin: 12.6 g/dL (ref 12.0–15.0)
Immature Granulocytes: 1 %
Lymphocytes Relative: 24 %
Lymphs Abs: 2 10*3/uL (ref 0.7–4.0)
MCH: 27.9 pg (ref 26.0–34.0)
MCHC: 31.4 g/dL (ref 30.0–36.0)
MCV: 88.7 fL (ref 80.0–100.0)
Monocytes Absolute: 0.9 10*3/uL (ref 0.1–1.0)
Monocytes Relative: 11 %
Neutro Abs: 5.4 10*3/uL (ref 1.7–7.7)
Neutrophils Relative %: 62 %
Platelets: 225 10*3/uL (ref 150–400)
RBC: 4.52 MIL/uL (ref 3.87–5.11)
RDW: 14.3 % (ref 11.5–15.5)
WBC: 8.5 10*3/uL (ref 4.0–10.5)
nRBC: 0 % (ref 0.0–0.2)

## 2022-06-22 LAB — IRON AND TIBC
Iron: 63 ug/dL (ref 28–170)
Saturation Ratios: 17 % (ref 10.4–31.8)
TIBC: 378 ug/dL (ref 250–450)
UIBC: 315 ug/dL

## 2022-06-22 LAB — FERRITIN: Ferritin: 18 ng/mL (ref 11–307)

## 2022-06-22 NOTE — Progress Notes (Signed)
Hematology/Oncology Progress note [phone] [phone]            Patient Care Team: Jaclyn Shaggy, MD as PCP - General (Internal Medicine) Antonieta Iba, MD as PCP - Cardiology (Cardiology) Rickard Patience, MD as Consulting Physician (Oncology)  ASSESSMENT & PLAN:   Iron deficiency #Iron deficiency secondary to her celiac disease.  Not able to tolerate oral iron supplementation. Labs are reviewed and discussed with patient. Lab Results  Component Value Date   HGB 12.6 06/22/2022   TIBC 378 06/22/2022   IRONPCTSAT 17 06/22/2022   FERRITIN 18 06/22/2022    No need for IV Venofer.    Orders Placed This Encounter  Procedures   CBC with Differential (Cancer Center Only)    Standing Status:   Future    Standing Expiration Date:   06/22/2023   Ferritin    Standing Status:   Future    Standing Expiration Date:   06/22/2023   Iron and TIBC    Standing Status:   Future    Standing Expiration Date:   06/22/2023   Retic Panel    Standing Status:   Future    Standing Expiration Date:   06/22/2023   Follow up in 12 months.  All questions were answered. The patient knows to call the clinic with any problems, questions or concerns.  Rickard Patience, MD, PhD [facility] Hematology Oncology 06/22/2022   CHIEF COMPLAINTS/REASON FOR VISIT:  Follow up with iron deficiency  HISTORY OF PRESENTING ILLNESS:   Brenda Craig is a  62 y.o.  female with PMH listed below was seen in consultation at the request of  Jaclyn Shaggy, MD  for evaluation of iron deficiency. Patient has a history of celiac disease, she also has a history of iron deficiency. recent EGD negative for active disease and tTG IgA negative suggests good compliance with GFD She has tried oral iron supplementation and did not tolerate due to GI side effects.  She was referred by gastroenterology for evaluation of iron deficiency and consideration of iron iron treatments. Patient reports history of IV iron  infusion..  After iron infusion, she developed a mild infusion reactions with skin itchiness/rash.  Symptoms were improved after Benadryl.  She was able to finish iron infusions with Benadryl as premed made an approximate.  Patient has history of coronary artery disease/STEMI, status post PCI x2. reports feeling tired Denies any black or bloody stool.  EGD: 02/17/2021 - 6 cm hiatal hernia, few fundic gland polyps in gastric body, normal examined duodenum with vili intact and no evidence of CD Colonoscopy: 02/17/2021 - one 5 mm TA removed from ascending colon  INTERVAL HISTORY Brenda Craig is a 62 y.o. female who has above history reviewed by me today presents for follow up visit for iron deficiency due to celiac disease.  She feels well no new complaints.     Review of Systems  Constitutional:  Negative for appetite change, chills, fatigue and fever.  HENT:   Negative for hearing loss and voice change.   Eyes:  Negative for eye problems.  Respiratory:  Negative for chest tightness and cough.   Cardiovascular:  Negative for chest pain.  Gastrointestinal:  Negative for abdominal distention, abdominal pain and blood in stool.  Endocrine: Negative for hot flashes.  Genitourinary:  Negative for difficulty urinating and frequency.   Musculoskeletal:  Negative for arthralgias.  Skin:  Negative for itching and rash.  Neurological:  Negative for extremity weakness.  Hematological:  Negative for adenopathy.  Psychiatric/Behavioral:  Negative for confusion.     MEDICAL HISTORY:  Past Medical History:  Diagnosis Date   Abnormal Pap smear of cervix 1988   Acid reflux 01/17/2013   Allergic rhinitis    Anemia    Anginal pain (HCC)    Anxiety    Arthritis    Asthma    Bronchiectasis (HCC)    Celiac disease    Cervical dysplasia 1988   CHF (congestive heart failure) (HCC)    Chronic kidney disease    Complication of anesthesia    bp drops after anesthesia and has to be kept  overnight   Coronary artery disease    Headache    migraine with visual aura   Heart trouble    History of cardiac arrest    History of hiatal hernia    History of kidney stones    History of mammogram 03/25/2013; 12/07/14   BIRADS 1; NEG   History of Papanicolaou smear of cervix 03/20/12; 12/28/15   -/-; -/-   Hypercholesteremia    Hypertension    IBS (irritable bowel syndrome)    Menopausal symptoms    MI (myocardial infarction) (HCC)    Mitral valve prolapse    Pneumonia    PONV (postoperative nausea and vomiting)    nausea only   Sleep apnea    Vulvovaginitis    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    SURGICAL HISTORY: Past Surgical History:  Procedure Laterality Date   ABLATION  2013   [facility]   BACK SURGERY     BACK SURGERY  2016; 2017   L4, L5   CARDIAC SURGERY  03/19/12; 10/2014   HEART CATH AND STENT   CESAREAN SECTION     1986; 1989   COLD KNIFE CONE BIOPSY  1988   COLONOSCOPY  08/2010   16 POLYPS (ALL BENIGN) DX C CELIAC DISEASE   COLONOSCOPY N/A 02/17/2021   Procedure: COLONOSCOPY;  Surgeon: Regis Bill, MD;  Location: [facility] ENDOSCOPY;  Service: Endoscopy;  Laterality: N/A;   COMBINED HYSTEROSCOPY DIAGNOSTIC / D&C  2013   [facility]   CYSTOSCOPY W/ RETROGRADES Left 01/20/2019   Procedure: CYSTOSCOPY WITH RETROGRADE PYELOGRAM;  Surgeon: Riki Altes, MD;  Location: [facility] ORS;  Service: Urology;  Laterality: Left;   CYSTOSCOPY W/ URETERAL STENT PLACEMENT Left 12/13/2018   Procedure: CYSTOSCOPY WITH RETROGRADE PYELOGRAM/URETERAL STENT PLACEMENT;  Surgeon: Bjorn Pippin, MD;  Location: [facility] ORS;  Service: Urology;  Laterality: Left;   CYSTOSCOPY/URETEROSCOPY/HOLMIUM LASER/STENT PLACEMENT Left 01/20/2019   Procedure: CYSTOSCOPY/URETEROSCOPY/HOLMIUM LASER/STENT EXCHANGE;  Surgeon: Riki Altes, MD;  Location: [facility] ORS;  Service: Urology;  Laterality: Left;   DILATION AND CURETTAGE OF UTERUS  2001   ESOPHAGOGASTRODUODENOSCOPY  08/2010   DX C  CELIAC DISEASE   ESOPHAGOGASTRODUODENOSCOPY N/A 02/17/2021   Procedure: ESOPHAGOGASTRODUODENOSCOPY (EGD);  Surgeon: Regis Bill, MD;  Location: [facility] ENDOSCOPY;  Service: Endoscopy;  Laterality: N/A;   HEART STENTS  05/27/2011   HIP SURGERY  2016   RIGHT IT BAND AND BURSA REMOVAL   OOPHORECTOMY Right 1982   TOTAL KNEE ARTHROPLASTY Left 01/19/2020   Procedure: TOTAL KNEE ARTHROPLASTY;  Surgeon: Sheral Apley, MD;  Location: [facility] ORS;  Service: Orthopedics;  Laterality: Left;   TOTAL KNEE REVISION Left 03/07/2022   Procedure: TOTAL KNEE REVISION;  Surgeon: Joen Laura, MD;  Location: [facility] ORS;  Service: Orthopedics;  Laterality: Left;    SOCIAL HISTORY: Social History  Socioeconomic History   Marital status: Divorced    Spouse name: Not on file   Number of children: 2   Years of education: 46   Highest education level: Not on file  Occupational History   Occupation: TEACHER  Tobacco Use   Smoking status: Never    Passive exposure: Never   Smokeless tobacco: Never  Vaping Use   Vaping Use: Never used  Substance and Sexual Activity   Alcohol use: Yes    Alcohol/week: 1.0 standard drink of alcohol    Types: 1 Standard drinks or equivalent per week   Drug use: No   Sexual activity: Not Currently    Birth control/protection: Surgical  Other Topics Concern   Not on file  Social History Narrative   Not on file   Social Determinants of Health   Financial Resource Strain: Not on file  Food Insecurity: No Food Insecurity (03/07/2022)   Hunger Vital Sign    Worried About Running Out of Food in the Last Year: Never true    Ran Out of Food in the Last Year: Never true  Transportation Needs: No Transportation Needs (03/07/2022)   PRAPARE - Transportation    Lack of Transportation (Medical): No    Lack of Transportation (Non-Medical): No  Physical Activity: Not on file  Stress: Not on file  Social Connections: Not on file  Intimate Partner Violence: Not At Risk  (03/07/2022)   Humiliation, Afraid, Rape, and Kick questionnaire    Fear of Current or Ex-Partner: No    Emotionally Abused: No    Physically Abused: No    Sexually Abused: No    FAMILY HISTORY: Family History  Problem Relation Age of Onset   CAD Father    Transient ischemic attack Father    Diabetes Father    Cerebrovascular Accident Father    Heart disease Father        M-MVP; F BETA;BLOCKERS   Hypothyroidism Father    Hyperthyroidism Mother    Cancer Maternal Uncle        KIDNEY   Uterine cancer Maternal Aunt 74    ALLERGIES:  is allergic to other, codeine, esomeprazole magnesium, iodinated contrast media, omeprazole magnesium, oxycodone-acetaminophen, percocet [oxycodone-acetaminophen], and tramadol.  MEDICATIONS:  Current Outpatient Medications  Medication Sig Dispense Refill   acetaminophen (TYLENOL) 500 MG tablet Take 1,000 mg by mouth as needed for moderate pain or headache.     albuterol (PROVENTIL) (2.5 MG/3ML) 0.083% nebulizer solution Take 3 mLs (2.5 mg total) by nebulization every 4 (four) hours as needed for wheezing or shortness of breath (coughing fits). (Patient taking differently: Take 2.5 mg by nebulization as needed for wheezing or shortness of breath (coughing fits).) 75 mL 1   albuterol (VENTOLIN HFA) 108 (90 Base) MCG/ACT inhaler Inhale 2 puffs into the lungs as needed for wheezing or shortness of breath.     azelastine (ASTELIN) 0.1 % nasal spray Place 2 sprays into both nostrils 2 (two) times daily. Use in each nostril as directed 30 mL 1   budesonide-formoterol (SYMBICORT) 80-4.5 MCG/ACT inhaler Inhale 2 puffs into the lungs in the morning and at bedtime. with spacer and rinse mouth afterwards. 1 each 5   clotrimazole-betamethasone (LOTRISONE) cream APPLY TO AFFECTED AREA TWICE A DAY AS NEEDED FOR SYMPTOMS FOR UP TO 2 WEEKS (Patient taking differently: Apply 1 Application topically as needed (itching).) 45 g 1   dicyclomine (BENTYL) 10 MG capsule Take 10 mg  by mouth as needed for spasms.  famotidine (PEPCID) 40 MG tablet Take 40 mg by mouth at bedtime.     LORazepam (ATIVAN) 1 MG tablet Take 1 mg by mouth 2 (two) times daily.     losartan (COZAAR) 25 MG tablet TAKE 1 TABLET (25 MG TOTAL) BY MOUTH DAILY. 90 tablet 2   Melatonin 5 MG CAPS Take 5 mg by mouth at bedtime.     montelukast (SINGULAIR) 10 MG tablet Take 10 mg by mouth daily.     nebivolol (BYSTOLIC) 5 MG tablet Take 1 tablet (5 mg total) by mouth daily with supper. 90 tablet 3   nystatin cream (MYCOSTATIN) Apply 1 Application topically as needed (rash).     pantoprazole (PROTONIX) 20 MG tablet Take 40 mg by mouth daily.     rosuvastatin (CRESTOR) 10 MG tablet Take 10 mg by mouth at bedtime.     triamcinolone cream (KENALOG) 0.1 % Apply 1 Application topically as needed (break out).     amoxicillin (AMOXIL) 500 MG capsule Take 2,000 mg by mouth See admin instructions. Before dental appointment (Patient not taking: Reported on 06/22/2022)     diphenhydrAMINE (BENADRYL ALLERGY) 25 MG tablet Take 25 mg by mouth as needed for itching. (Patient not taking: Reported on 06/22/2022)     NITROSTAT 0.4 MG SL tablet Place 0.4 mg under the tongue every 5 (five) minutes as needed for chest pain. (Patient not taking: Reported on 06/22/2022)     Phenylephrine-DM-GG (TUSSIDEX PO) Take 5 mLs by mouth as needed (cough). (Patient not taking: Reported on 06/22/2022)     No current facility-administered medications for this visit.     PHYSICAL EXAMINATION:  Vitals:   06/22/22 1208  BP: 97/65  Pulse: 70  Resp: 18  Temp: 97.8 F (36.6 C)   Filed Weights   06/22/22 1208  Weight: 233 lb (105.7 kg)    Physical Exam Constitutional:      General: She is not in acute distress. HENT:     Head: Normocephalic and atraumatic.  Eyes:     General: No scleral icterus. Cardiovascular:     Rate and Rhythm: Normal rate.  Pulmonary:     Effort: Pulmonary effort is normal. No respiratory distress.   Abdominal:     General: There is no distension.  Musculoskeletal:        General: No deformity. Normal range of motion.     Cervical back: Normal range of motion.  Skin:    Findings: No erythema or rash.  Neurological:     Mental Status: She is alert and oriented to person, place, and time. Mental status is at baseline.  Psychiatric:        Mood and Affect: Mood normal.     LABORATORY DATA:  I have reviewed the data as listed Lab Results  Component Value Date   WBC 8.5 06/22/2022   HGB 12.6 06/22/2022   HCT 40.1 06/22/2022   MCV 88.7 06/22/2022   PLT 225 06/22/2022   Recent Labs    02/23/22 0926 03/08/22 0308  NA 138 134*  K 4.0 4.6  CL 105 104  CO2 25 25  GLUCOSE 106* 148*  BUN 13 16  CREATININE 0.92 0.87  CALCIUM 8.8* 8.3*  GFRNONAA >60 >60  PROT 7.2  --   ALBUMIN 3.9  --   AST 30  --   ALT 28  --   ALKPHOS 160*  --   BILITOT 0.5  --    Iron/TIBC/Ferritin/ %Sat    Component Value Date/Time  IRON 63 06/22/2022 1153   TIBC 378 06/22/2022 1153   FERRITIN 18 06/22/2022 1153   IRONPCTSAT 17 06/22/2022 1153      RADIOGRAPHIC STUDIES: I have personally reviewed the radiological images as listed and agreed with the findings in the report. No results found.

## 2022-06-22 NOTE — Assessment & Plan Note (Addendum)
#  Iron deficiency secondary to her celiac disease.  Not able to tolerate oral iron supplementation. Labs are reviewed and discussed with patient. Lab Results  Component Value Date   HGB 12.6 06/22/2022   TIBC 378 06/22/2022   IRONPCTSAT 17 06/22/2022   FERRITIN 18 06/22/2022    No need for IV Venofer.

## 2022-06-29 ENCOUNTER — Ambulatory Visit (INDEPENDENT_AMBULATORY_CARE_PROVIDER_SITE_OTHER): Payer: BC Managed Care – PPO | Admitting: Physician Assistant

## 2022-06-29 ENCOUNTER — Ambulatory Visit
Admission: RE | Admit: 2022-06-29 | Discharge: 2022-06-29 | Disposition: A | Discharge status: Home / Self Care | Payer: BC Managed Care – PPO | Source: Ambulatory Visit | Attending: Physician Assistant | Admitting: Physician Assistant

## 2022-06-29 ENCOUNTER — Ambulatory Visit
Admission: RE | Admit: 2022-06-29 | Discharge: 2022-06-29 | Disposition: A | Discharge status: Home / Self Care | Payer: BC Managed Care – PPO | Attending: Physician Assistant | Admitting: Physician Assistant

## 2022-06-29 ENCOUNTER — Other Ambulatory Visit: Payer: Self-pay | Admitting: *Deleted

## 2022-06-29 VITALS — BP 94/64 | HR 66 | Ht 64.0 in | Wt 233.5 lb

## 2022-06-29 DIAGNOSIS — Z09 Encounter for follow-up examination after completed treatment for conditions other than malignant neoplasm: Secondary | ICD-10-CM | POA: Diagnosis not present

## 2022-06-29 DIAGNOSIS — Z87442 Personal history of urinary calculi: Secondary | ICD-10-CM

## 2022-06-29 DIAGNOSIS — R109 Unspecified abdominal pain: Secondary | ICD-10-CM | POA: Diagnosis not present

## 2022-06-29 NOTE — Progress Notes (Signed)
06/29/2022 1:09 PM   Brenda Craig [birth year] [id number]  CC: Chief Complaint  Patient presents with   Flank Pain   HPI: Brenda Craig is a 62 y.o. female with PMH nephrolithiasis, celiac disease, and back problems who presents today for annual stone follow-up.   Today she reports no stone episodes in the past year.  She underwent a left knee replacement revision about 3 months ago and is feeling well.  No acute concerns today.  KUB today with no radiopaque urolithiasis.  PMH: Past Medical History:  Diagnosis Date   Abnormal Pap smear of cervix 1988   Acid reflux 01/17/2013   Allergic rhinitis    Anemia    Anginal pain (HCC)    Anxiety    Arthritis    Asthma    Bronchiectasis (HCC)    Celiac disease    Cervical dysplasia 1988   CHF (congestive heart failure) (HCC)    Chronic kidney disease    Complication of anesthesia    bp drops after anesthesia and has to be kept overnight   Coronary artery disease    Headache    migraine with visual aura   Heart trouble    History of cardiac arrest    History of hiatal hernia    History of kidney stones    History of mammogram 03/25/2013; 12/07/14   BIRADS 1; NEG   History of Papanicolaou smear of cervix 03/20/12; 12/28/15   -/-; -/-   Hypercholesteremia    Hypertension    IBS (irritable bowel syndrome)    Menopausal symptoms    MI (myocardial infarction) (HCC)    Mitral valve prolapse    Pneumonia    PONV (postoperative nausea and vomiting)    nausea only   Sleep apnea    Vulvovaginitis    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    Surgical History: Past Surgical History:  Procedure Laterality Date   ABLATION  2013   [facility]   BACK SURGERY     BACK SURGERY  2016; 2017   L4, L5   CARDIAC SURGERY  03/19/12; 10/2014   HEART CATH AND STENT   CESAREAN SECTION     1986; 1989   COLD KNIFE CONE BIOPSY  1988   COLONOSCOPY  08/2010   16 POLYPS (ALL BENIGN) DX C CELIAC DISEASE   COLONOSCOPY  N/A 02/17/2021   Procedure: COLONOSCOPY;  Surgeon: Regis Bill, MD;  Location: [facility];  Service: Endoscopy;  Laterality: N/A;   COMBINED HYSTEROSCOPY DIAGNOSTIC / D&C  2013   [facility]   CYSTOSCOPY W/ RETROGRADES Left 01/20/2019   Procedure: CYSTOSCOPY WITH RETROGRADE PYELOGRAM;  Surgeon: Riki Altes, MD;  Location: [facility];  Service: Urology;  Laterality: Left;   CYSTOSCOPY W/ URETERAL STENT PLACEMENT Left 12/13/2018   Procedure: CYSTOSCOPY WITH RETROGRADE PYELOGRAM/URETERAL STENT PLACEMENT;  Surgeon: Bjorn Pippin, MD;  Location: [facility];  Service: Urology;  Laterality: Left;   CYSTOSCOPY/URETEROSCOPY/HOLMIUM LASER/STENT PLACEMENT Left 01/20/2019   Procedure: CYSTOSCOPY/URETEROSCOPY/HOLMIUM LASER/STENT EXCHANGE;  Surgeon: Riki Altes, MD;  Location: [facility];  Service: Urology;  Laterality: Left;   DILATION AND CURETTAGE OF UTERUS  2001   ESOPHAGOGASTRODUODENOSCOPY  08/2010   DX C CELIAC DISEASE   ESOPHAGOGASTRODUODENOSCOPY N/A 02/17/2021   Procedure: ESOPHAGOGASTRODUODENOSCOPY (EGD);  Surgeon: Regis Bill, MD;  Location: [facility];  Service: Endoscopy;  Laterality: N/A;   HEART STENTS  05/27/2011   HIP SURGERY  2016   RIGHT IT BAND AND BURSA  REMOVAL   OOPHORECTOMY Right 1982   TOTAL KNEE ARTHROPLASTY Left 01/19/2020   Procedure: TOTAL KNEE ARTHROPLASTY;  Surgeon: Sheral Apley, MD;  Location: [facility];  Service: Orthopedics;  Laterality: Left;   TOTAL KNEE REVISION Left 03/07/2022   Procedure: TOTAL KNEE REVISION;  Surgeon: Joen Laura, MD;  Location: [facility];  Service: Orthopedics;  Laterality: Left;    Home Medications:  Allergies as of 06/29/2022       Reactions   Other Other (See Comments)   Gluten allergy Celiac    Codeine Nausea And Vomiting, Rash   Esomeprazole Magnesium Palpitations, Other (See Comments)   IRREGULAR HEARTBEAT   Iodinated Contrast Media Itching   Omeprazole Magnesium Palpitations, Other (See Comments)   IRREGULAR  HEARTBEAT   Oxycodone-acetaminophen Itching, Nausea And Vomiting, Rash   Can take with benadryl   Percocet [oxycodone-acetaminophen] Itching, Nausea And Vomiting, Rash   Can take with benadryl    Tramadol Rash   Can take with benadryl         Medication List        Accurate as of June 29, 2022  1:09 PM. If you have any questions, ask your nurse or doctor.          acetaminophen 500 MG tablet Commonly known as: TYLENOL Take 1,000 mg by mouth as needed for moderate pain or headache.   albuterol 108 (90 Base) MCG/ACT inhaler Commonly known as: VENTOLIN HFA Inhale 2 puffs into the lungs as needed for wheezing or shortness of breath. What changed: Another medication with the same name was changed. Make sure you understand how and when to take each.   albuterol (2.5 MG/3ML) 0.083% nebulizer solution Commonly known as: PROVENTIL Take 3 mLs (2.5 mg total) by nebulization every 4 (four) hours as needed for wheezing or shortness of breath (coughing fits). What changed: when to take this   amoxicillin 500 MG capsule Commonly known as: AMOXIL Take 2,000 mg by mouth See admin instructions. Before dental appointment   azelastine 0.1 % nasal spray Commonly known as: ASTELIN Place 2 sprays into both nostrils 2 (two) times daily. Use in each nostril as directed   Benadryl Allergy 25 MG tablet Generic drug: diphenhydrAMINE Take 25 mg by mouth as needed for itching.   budesonide-formoterol 80-4.5 MCG/ACT inhaler Commonly known as: Symbicort Inhale 2 puffs into the lungs in the morning and at bedtime. with spacer and rinse mouth afterwards.   clotrimazole-betamethasone cream Commonly known as: LOTRISONE APPLY TO AFFECTED AREA TWICE A DAY AS NEEDED FOR SYMPTOMS FOR UP TO 2 WEEKS What changed:  how much to take how to take this when to take this reasons to take this additional instructions   dicyclomine 10 MG capsule Commonly known as: BENTYL Take 10 mg by mouth as needed for  spasms.   famotidine 40 MG tablet Commonly known as: PEPCID Take 40 mg by mouth at bedtime.   LORazepam 1 MG tablet Commonly known as: ATIVAN Take 1 mg by mouth 2 (two) times daily.   losartan 25 MG tablet Commonly known as: COZAAR TAKE 1 TABLET (25 MG TOTAL) BY MOUTH DAILY.   Melatonin 5 MG Caps Take 5 mg by mouth at bedtime.   montelukast 10 MG tablet Commonly known as: SINGULAIR Take 10 mg by mouth daily.   nebivolol 5 MG tablet Commonly known as: BYSTOLIC Take 1 tablet (5 mg total) by mouth daily with supper.   Nitrostat 0.4 MG SL tablet Generic drug: nitroGLYCERIN Place 0.4  mg under the tongue every 5 (five) minutes as needed for chest pain.   nystatin cream Commonly known as: MYCOSTATIN Apply 1 Application topically as needed (rash).   pantoprazole 20 MG tablet Commonly known as: PROTONIX Take 40 mg by mouth daily.   rosuvastatin 10 MG tablet Commonly known as: CRESTOR Take 10 mg by mouth at bedtime.   triamcinolone cream 0.1 % Commonly known as: KENALOG Apply 1 Application topically as needed (break out).   TUSSIDEX PO Take 5 mLs by mouth as needed (cough).        Allergies:  Allergies  Allergen Reactions   Other Other (See Comments)    Gluten allergy Celiac    Codeine Nausea And Vomiting and Rash   Esomeprazole Magnesium Palpitations and Other (See Comments)    IRREGULAR HEARTBEAT   Iodinated Contrast Media Itching   Omeprazole Magnesium Palpitations and Other (See Comments)    IRREGULAR HEARTBEAT   Oxycodone-Acetaminophen Itching, Nausea And Vomiting and Rash    Can take with benadryl   Percocet [Oxycodone-Acetaminophen] Itching, Nausea And Vomiting and Rash    Can take with benadryl    Tramadol Rash    Can take with benadryl     Family History: Family History  Problem Relation Age of Onset   CAD Father    Transient ischemic attack Father    Diabetes Father    Cerebrovascular Accident Father    Heart disease Father        M-MVP;  F BETA;BLOCKERS   Hypothyroidism Father    Hyperthyroidism Mother    Cancer Maternal Uncle        KIDNEY   Uterine cancer Maternal Aunt 79    Social History:   reports that she has never smoked. She has never been exposed to tobacco smoke. She has never used smokeless tobacco. She reports current alcohol use of about 1.0 standard drink of alcohol per week. She reports that she does not use drugs.  Physical Exam: Ht 5\' 4"  (1.626 m)   Wt 233 lb 8 oz (105.9 kg)   BMI 40.08 kg/m   Constitutional:  Alert and oriented, no acute distress, nontoxic appearing HEENT: [employer], AT Cardiovascular: No clubbing, cyanosis, or edema Respiratory: Normal respiratory effort, no increased work of breathing Skin: No rashes, bruises or suspicious lesions Neurologic: Grossly intact, no focal deficits, moving all 4 extremities Psychiatric: Normal mood and affect  Pertinent Imaging: KUB, 06/29/2022: CLINICAL DATA:  Kidney stone. Flank pain with history of urolithiasis.   EXAM: ABDOMEN - 1 VIEW   COMPARISON:  CT 05/04/2021   FINDINGS: The punctate intrarenal calculi on prior CT are not seen by radiograph. No visualized urolithiasis. Pelvic calcifications correspond to phleboliths on prior CT. Normal bowel gas pattern. Small to moderate colonic stool burden. No concerning intraabdominal mass effect. On limited assessment, no acute osseous findings.   IMPRESSION: The punctate intrarenal calculi on prior CT are not seen by radiograph. No visualized urolithiasis.     Electronically Signed   By: Narda Rutherford M.D.   On: 07/05/2022 16:38  I personally reviewed the images referenced above and note no radiopaque urolithiasis.  Assessment & Plan:   1. History of nephrolithiasis No evidence of interval stone development on KUB today.  No renal colic in the past year.  She prefers to continue with annual follow-up which is reasonable.  Will see her back next year, sooner if needed.  Return in about 1  year (around 06/29/2023) for Annual stone visit with KUB prior.  Carman Ching, PA-C  [facility] [place] [address], [address] [place], [place] [address] [phone]

## 2022-07-17 ENCOUNTER — Telehealth: Payer: Self-pay | Admitting: Obstetrics and Gynecology

## 2022-07-17 NOTE — Telephone Encounter (Signed)
I contacted the patient via phone. I left generic message follow up on the patient's request advising to call our office back.  I attempt to pull the patient traction history but it wouldn't allow me to pull for her office visit's with Helmut Muster. I had Thereasa Distance follow me with the process of pulling information. She advised that the patient would needing to contact the billing for the itemized statements. Contacted number [phone]

## 2022-07-17 NOTE — Telephone Encounter (Signed)
The patient called in requesting an Itemized statement for her past visit's for her Annual exams. The patient states she has a wellness policy that rewards then for up to 6 visits. Visits on file are 02/08/2017, 07/08/18,07/14/20 and 08/03/21.

## 2022-07-18 NOTE — Telephone Encounter (Signed)
The patient came in office and is need a letter with our letter head stating she has completed annuals for the following date. Please advise.

## 2022-07-23 NOTE — Telephone Encounter (Signed)
Letter with annual exam dates has been sent via mychart.

## 2022-08-02 ENCOUNTER — Telehealth: Payer: Self-pay | Admitting: Cardiovascular Disease

## 2022-08-02 NOTE — Telephone Encounter (Signed)
Pt is requesting a callback regarding her Nitroglycerin and it not being on her current medication list for a refill. Please advise

## 2022-08-02 NOTE — Telephone Encounter (Signed)
Called patient, advised that she needed a refill of her nitroglycerin- she states she does not have to take it, but with her history she likes to have it with her. Last refill for it was back in 2014. Will check with MD if okay to refill. Thanks.   Patient uses [facility] [address].   Thanks!

## 2022-08-03 NOTE — Telephone Encounter (Signed)
Pt called for an update regarding nitroglycerin refill. Pt informed we are awaiting responded from Dr. Mariah Milling.  Pt verbalized understanding.

## 2022-08-03 NOTE — Telephone Encounter (Signed)
Pt calling for an update on having nitroglycerin to the pharmacy. Please advise.

## 2022-08-04 ENCOUNTER — Other Ambulatory Visit: Payer: Self-pay | Admitting: Cardiovascular Disease

## 2022-08-04 MED ORDER — NITROGLYCERIN 0.4 MG SL SUBL: 0.4000 mg | SUBLINGUAL_TABLET | SUBLINGUAL | 3 refills | Status: DC | PRN

## 2022-08-20 ENCOUNTER — Telehealth: Payer: Self-pay | Admitting: Obstetrics and Gynecology

## 2022-08-20 NOTE — Telephone Encounter (Signed)
I contacted the patient via phone due to scheduling adjustment with Helmut Muster [place] for Wednesday, 09/12/11 at 10:55 am.I left voicemail for the patient to contact our office for rescheduling. I have rescheduled to the next available appointment for Friday, 9/6 at 10:55 am with [facility]. Awaiting confirmation.

## 2022-08-21 NOTE — Telephone Encounter (Signed)
The patient confirmed thru  my chart new rescheduled date for 09/14/22 with ABC

## 2022-08-22 ENCOUNTER — Encounter: Payer: Self-pay | Admitting: Internal Medicine

## 2022-08-22 ENCOUNTER — Ambulatory Visit: Payer: BC Managed Care – PPO | Admitting: Internal Medicine

## 2022-08-22 VITALS — BP 110/72 | HR 70 | Temp 98.1°F | Ht 64.0 in | Wt 231.2 lb

## 2022-08-22 DIAGNOSIS — G4733 Obstructive sleep apnea (adult) (pediatric): Secondary | ICD-10-CM | POA: Diagnosis not present

## 2022-08-22 DIAGNOSIS — R053 Chronic cough: Secondary | ICD-10-CM | POA: Diagnosis not present

## 2022-08-22 DIAGNOSIS — J309 Allergic rhinitis, unspecified: Secondary | ICD-10-CM | POA: Diagnosis not present

## 2022-08-22 MED ORDER — AZELASTINE HCL 0.1 % NA SOLN
2.0000 | Freq: Two times a day (BID) | NASAL | 12 refills | Status: DC
Start: 2022-08-22 — End: 2023-02-22

## 2022-08-22 MED ORDER — TRIAMCINOLONE ACETONIDE 55 MCG/ACT NA AERO: 1.0000 | INHALATION_SPRAY | Freq: Two times a day (BID) | NASAL | 2 refills | Status: DC

## 2022-08-22 MED ORDER — CETIRIZINE HCL 10 MG PO TABS: 10.0000 mg | ORAL_TABLET | Freq: Every day | ORAL | 6 refills | Status: DC

## 2022-08-22 NOTE — Progress Notes (Signed)
@Patient  ID: Brenda Craig, female    DOB: [birth year], 62 y.o.   MRN: [id number]  SYNOPSIS [name] is referred for evaluation of chronic cough of many years duration. She dates it back to 2009 or 2010. Her cough has been intermittent but has to be worse in the spring autumn and winter. DX with ASTHMA DX of GERD DX of OSA S/p cardiac arrest 2013 AHI of 21  PREVIOUS HISTORY Patient with severe persistent cough  Patient has a chronic cough for the last 10 years She has been worked up multiple times at [facility] She has had ENT referrals in the past with extensive work-up   At this time no one can tell her what is causing her cough Patient is requesting refill on Tussionex I have explained to patient we no longer prescribe narcotic-based antitussive medicines    Exposed to cats and dogs at home She states she is not allergic but would like a referral for assessment   Patient had normal eosinophil levels of 100 Patient is not a candidate for immunological or biological therapy    CC  Follow up assessment of OSA Follow up Cough   HPI: 62 year old female, never smoked. PMH significant for OSA, chronic cough, hyperreactive airways, chronic respiratory failure with hypoxia, pneumonia, HTN, CAD, acid reflux, celiac disease, hyperlipidemia.   08/22/2022- Interim hx  Patient presents today for OSA follow-up/ OSA and chronic cough.  Assessment of download shows great compliance Well-controlled OSA With auto CPAP 5-10 She has hx chronic cough for >10 years, currently productive cough with yellow sputum. No associated shortness of breath. She has not needed to use Albuterol.  I believe inhaler therapy is not working  Regarding chronic cough Patient has significant allergic rhinitis as well as severe reflux disease along with a history of hiatal hernia in the setting of obesity and restrictive lung disease with previous history of COVID infection 3 times in the past  several years   No exacerbation at this time No evidence of heart failure at this time No evidence or signs of infection at this time No respiratory distress No fevers, chills, nausea, vomiting, diarrhea No evidence of lower extremity edema No evidence hemoptysis   JANUARY 2024 CT CHEST Generally bland appearing, bandlike scarring and or atelectasis of the bilateral lung bases as well as of the anterior right upper lobe. Minimal dependent bibasilar ground-glass without evident interlobular septal thickening or subpleural bronchiolectasis. Minimal, tubular bronchiectasis at the lung bases. Findings are not significantly changed compared to examinations dating back to 05/21/2016. Findings are most consistent with sequelae of prior infection or aspiration without specific evidence of fibrotic interstitial lung disease  CT of the chest reviewed in detail with the patient Next step is to perform bronchoscopy however patient is unwilling to do this at this time    Allergies  Allergen Reactions   Other Other (See Comments)    Gluten allergy Celiac    Codeine Nausea And Vomiting and Rash   Esomeprazole Magnesium Palpitations and Other (See Comments)    IRREGULAR HEARTBEAT   Iodinated Contrast Media Itching   Omeprazole Magnesium Palpitations and Other (See Comments)    IRREGULAR HEARTBEAT   Oxycodone-Acetaminophen Itching, Nausea And Vomiting and Rash    Can take with benadryl   Percocet [Oxycodone-Acetaminophen] Itching, Nausea And Vomiting and Rash    Can take with benadryl    Tramadol Rash    Can take with benadryl     Immunization History  Administered Date(s) Administered   Influenza,inj,Quad PF,6+ Mos 05/25/2013, 11/13/2017, 10/17/2018   Influenza-Unspecified 10/10/2020   PFIZER Comirnaty(Gray Top)Covid-19 Tri-Sucrose Vaccine 05/06/2020   PFIZER(Purple Top)SARS-COV-2 Vaccination 04/16/2019, 05/07/2019   Pneumococcal Polysaccharide-23 12/16/2018    Past Medical  History:  Diagnosis Date   Abnormal Pap smear of cervix 1988   Acid reflux 01/17/2013   Allergic rhinitis    Anemia    Anginal pain (HCC)    Anxiety    Arthritis    Asthma    Bronchiectasis (HCC)    Celiac disease    Cervical dysplasia 1988   CHF (congestive heart failure) (HCC)    Chronic kidney disease    Complication of anesthesia    bp drops after anesthesia and has to be kept overnight   Coronary artery disease    Headache    migraine with visual aura   Heart trouble    History of cardiac arrest    History of hiatal hernia    History of kidney stones    History of mammogram 03/25/2013; 12/07/14   BIRADS 1; NEG   History of Papanicolaou smear of cervix 03/20/12; 12/28/15   -/-; -/-   Hypercholesteremia    Hypertension    IBS (irritable bowel syndrome)    Menopausal symptoms    MI (myocardial infarction) (HCC)    Mitral valve prolapse    Pneumonia    PONV (postoperative nausea and vomiting)    nausea only   Sleep apnea    Vulvovaginitis    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     albuterol (PROVENTIL) (2.5 MG/3ML) 0.083% nebulizer solution Take 3 mLs (2.5 mg total) by nebulization every 4 (four) hours as needed for wheezing or shortness of breath (coughing fits). (Patient taking differently: Take 2.5 mg by nebulization as needed for wheezing or shortness of breath (coughing fits).) 75 mL 1   albuterol (VENTOLIN HFA) 108 (90 Base) MCG/ACT inhaler Inhale 2 puffs into the lungs as needed for wheezing or shortness of breath.     amoxicillin (AMOXIL) 500 MG capsule Take 2,000 mg by mouth See admin instructions. Before  dental appointment     azelastine (ASTELIN) 0.1 % nasal spray Place 2 sprays into both nostrils 2 (two) times daily. Use in each nostril as directed 30 mL 1   budesonide-formoterol (SYMBICORT) 80-4.5 MCG/ACT inhaler Inhale 2 puffs into the lungs in the morning and at bedtime. with spacer and rinse mouth afterwards. 1 each 5   clotrimazole-betamethasone (LOTRISONE) cream APPLY TO AFFECTED AREA TWICE A DAY AS NEEDED FOR SYMPTOMS FOR UP TO 2 WEEKS (Patient taking differently: Apply 1 Application topically as needed (itching).) 45 g 1   dicyclomine (BENTYL) 10 MG capsule Take 10 mg by mouth as needed for spasms.     diphenhydrAMINE (BENADRYL ALLERGY) 25 MG tablet Take 25 mg by mouth as needed for itching.     famotidine (PEPCID) 40 MG tablet Take 40 mg by mouth at bedtime.     LORazepam (ATIVAN) 1 MG tablet Take 1 mg by mouth 2 (two) times daily.     losartan (COZAAR) 25 MG tablet TAKE  1 TABLET (25 MG TOTAL) BY MOUTH DAILY. 90 tablet 2   Melatonin 5 MG CAPS Take 5 mg by mouth at bedtime.     montelukast (SINGULAIR) 10 MG tablet Take 10 mg by mouth daily.     nebivolol (BYSTOLIC) 5 MG tablet Take 1 tablet (5 mg total) by mouth daily with supper. 90 tablet 3   nitroGLYCERIN (NITROSTAT) 0.4 MG SL tablet Place 1 tablet (0.4 mg total) under the tongue every 5 (five) minutes as needed for chest pain. 25 tablet 3   nystatin cream (MYCOSTATIN) Apply 1 Application topically as needed (rash).     pantoprazole (PROTONIX) 20 MG tablet Take 40 mg by mouth daily.     Phenylephrine-DM-GG (TUSSIDEX PO) Take 5 mLs by mouth as needed (cough).     rosuvastatin (CRESTOR) 10 MG tablet Take 10 mg by mouth at bedtime.     triamcinolone cream (KENALOG) 0.1 % Apply 1 Application topically as needed (break out).     No facility-administered medications prior to visit.    There were no vitals taken for this visit.   PFTs 2023 Consistent with moderate restrictive disease likely related to obesity   Review of  Systems: Gen:  Denies  fever, sweats, chills weight loss  HEENT: Denies blurred vision, double vision, ear pain, eye pain, hearing loss, nose bleeds, sore throat Cardiac:  No dizziness, chest pain or heaviness, chest tightness,edema, No JVD Resp:   + cough, -sputum production, -shortness of breath,-wheezing, -hemoptysis,  Other:  All other systems negative   Physical Examination:   General Appearance: No distress  EYES PERRLA, EOM intact.   NECK Supple, No JVD Pulmonary: normal breath sounds, No wheezing.  CardiovascularNormal S1,S2.  No m/r/g.   Abdomen: Benign, Soft, non-tender. Neurology UE/LE 5/5 strength, no focal deficits Ext pulses intact, cap refill intact ALL OTHER ROS ARE NEGATIVE   Lab Results:  CBC    Component Value Date/Time   WBC 8.5 06/22/2022 1153   RBC 4.62 06/22/2022 1153   RBC 4.52 06/22/2022 1153   HGB 12.6 06/22/2022 1153   HGB 11.9 (L) 06/10/2013 0209   HCT 40.1 06/22/2022 1153   HCT 36.1 06/10/2013 0209   PLT 225 06/22/2022 1153   PLT 214 06/10/2013 0209   MCV 88.7 06/22/2022 1153   MCV 92 06/10/2013 0209   MCH 27.9 06/22/2022 1153   MCHC 31.4 06/22/2022 1153   RDW 14.3 06/22/2022 1153   RDW 15.2 (H) 06/10/2013 0209   LYMPHSABS 2.0 06/22/2022 1153   LYMPHSABS 2.0 06/10/2013 0209   MONOABS 0.9 06/22/2022 1153   MONOABS 0.7 06/10/2013 0209   EOSABS 0.1 06/22/2022 1153   EOSABS 0.1 06/10/2013 0209   BASOSABS 0.1 06/22/2022 1153   BASOSABS 0.0 06/10/2013 0209    BMET    Component Value Date/Time   NA 134 (L) 03/08/2022 0308   NA 143 06/10/2013 0209   K 4.6 03/08/2022 0308   K 3.7 06/10/2013 0209   CL 104 03/08/2022 0308   CL 109 (H) 06/10/2013 0209   CO2 25 03/08/2022 0308   CO2 29 06/10/2013 0209   GLUCOSE 148 (H) 03/08/2022 0308   GLUCOSE 90 06/10/2013 0209   BUN 16 03/08/2022 0308   BUN 14 06/10/2013 0209   CREATININE 0.87 03/08/2022 0308   CREATININE 0.87 06/10/2013 0209   CALCIUM 8.3 (L) 03/08/2022 0308   CALCIUM 8.9  06/10/2013 0209   GFRNONAA >60 03/08/2022 0308   GFRNONAA >60 06/10/2013 0209   GFRAA >60 12/18/2018 2130  GFRAA >60 06/10/2013 0209      Assessment & Plan:  62 year old pleasant white female seen by me 4 years ago for chronic cough as well as underlying obstructive sleep apnea in the setting of obesity and deconditioned state with significant allergic rhinitis with underlying GERD with history of hiatal hernia in the setting of obesity  OSA Well-controlled AHI to be reduced due to 8 cm water pressure Patient tolerates CPAP Patient uses and benefits from therapy  Airway hyperreactivity due to allergic rhinitis No wheezing on exam I do believe inhaler therapy will not be helpful Start Zyrtec Start Nasacort Start Astelin nasal sprays  GERD Hiatal hernia PPI twice daily Pepcid twice daily  Chronic cough - Patient has had a chronic cough for >10 years. She has scarring to right middle lung and left lower lung on prior chest imaging. Cough is currently productive with yellow mucus. No associated shortness of breath. I suspect PND is driving a lot of her coughing symptoms.  Also associated with morbid obesity and restrictive lung disease along with uncontrolled GERD Plan for gabapentin and bronchoscopy assessment at next office visit  Obesity -recommend changing diet Recommend significant weight loss and weight loss clinic  Deconditioned state -Recommend increased daily activity and exercise   Hx of respiratory failure Oxygen discontinued    MEDICATION ADJUSTMENTS/LABS AND TESTS ORDERED: Start Zyrtec 10 mg at night Start Nasacort nasal sprays use as directed Start Astelin nasal spray to use as directed  Use albuterol as needed Stop Symbicort  Continue CPAP therapy as prescribed will change pressures to lower settings Adjust auto CPAP 3 to 8 cm water pressure  Recommend significant weight loss Consider weight loss clinic referral   Continue medications for  reflux disease   Will reassess with gabapentin and bronchoscopy on next office visit   CURRENT MEDICATIONS REVIEWED AT LENGTH WITH PATIENT TODAY   Patient  satisfied with Plan of action and management. All questions answered  Follow up 1 month  Total Time Spent  43 mins   Lucie Leather, M.D.  [facility]  Medical Director [facility] Medical Director [facility] Cardio-Pulmonary Department

## 2022-08-22 NOTE — Patient Instructions (Addendum)
Start Zyrtec 10 mg at night Start Nasacort nasal sprays use as directed Start Astelin nasal spray to use as directed  Use albuterol as needed Stop Symbicort  Excellent job with CPAP A+ Continue CPAP therapy as prescribed will change pressures to lower settings Adjust auto CPAP 3 to 8 cm water pressure  Recommend significant weight loss Consider weight loss clinic referral   Continue medications for reflux disease   Will reassess with gabapentin and bronchoscopy on next office visit

## 2022-08-22 NOTE — Addendum Note (Signed)
Addended by: Bonney Leitz on: 08/22/2022 03:27 PM   Modules accepted: Orders

## 2022-08-31 ENCOUNTER — Telehealth: Payer: Self-pay

## 2022-08-31 DIAGNOSIS — G4733 Obstructive sleep apnea (adult) (pediatric): Secondary | ICD-10-CM

## 2022-08-31 NOTE — Telephone Encounter (Signed)
Received note from [facility]. Cpap can not be set to a pressure of 3. Lowest pressure is 5.   Dr. Sherryle Lis, please advise on settings. Thanks

## 2022-09-03 NOTE — Telephone Encounter (Signed)
Order has been placed to [facility].  Nothing further needed.

## 2022-09-05 ENCOUNTER — Telehealth: Payer: Self-pay | Admitting: Primary Care

## 2022-09-05 DIAGNOSIS — G4733 Obstructive sleep apnea (adult) (pediatric): Secondary | ICD-10-CM

## 2022-09-05 NOTE — Telephone Encounter (Signed)
Brenda Craig from [facility] calling. RX sent in has Oxy settings @ 3-8. These are not good settings. Please resubmit RX.  Brenda Craig is @ [phone]

## 2022-09-12 ENCOUNTER — Ambulatory Visit: Payer: BC Managed Care – PPO | Admitting: Obstetrics and Gynecology

## 2022-09-12 NOTE — Telephone Encounter (Signed)
Updated order placed. Adjust auto CPAP 3 to 8 cm water pressure

## 2022-09-13 NOTE — Progress Notes (Signed)
PCP: Jaclyn Shaggy, MD   Chief Complaint  Patient presents with   Gynecologic Exam    Vaginal itching, abnormal odor,  no discharge    HPI:      [name] is a 62 y.o. [id number] who LMP was No LMP recorded. Patient has had an ablation., presents today for her annual examination.  Her menses are absent due to ablation and she is postmenopausal. No PMB. She has improved vasomotor sx.   Sex activity: not sexually active. She does not have vaginal dryness. Does get vaginal irritation/fissures due to moisture. Treats with lotrisone crm ext a few times with relief. Vaginal itching worse this yr, now waking at night scratching.  Last Pap: 07/14/20 Results were: no abnormalities /neg HPV DNA.  Hx of STDs: HPV on pap  Last mammogram: 10/19/21 at [facility].  Results were: normal--routine follow-up in 12 months There is no FH of breast cancer. There is no FH of ovarian cancer. The patient does do self-breast exams.  Colonoscopy: colonoscopy 2023 with [facility];  Repeat due after 7 yrs.   Tobacco use: The patient denies current or previous tobacco use. Alcohol use: none No drug use Exercise: not active  She does get adequate calcium and Vitamin D in her diet.  Labs with PCP.  She cont to get fungal infections under panus; saw derm and given 2 creams to use with sx relief. Sx recurred this AM. Pt tries to keep area dry; still has crm at home and started using again this AM.    Past Medical History:  Diagnosis Date   Abnormal Pap smear of cervix 1988   Acid reflux 01/17/2013   Allergic rhinitis    Anemia    Anginal pain (HCC)    Anxiety    Arthritis    Asthma    Bronchiectasis (HCC)    Celiac disease    Cervical dysplasia 1988   CHF (congestive heart failure) (HCC)    Chronic kidney disease    Complication of anesthesia    bp drops after anesthesia and has to be kept overnight   Coronary artery disease    Headache    migraine with visual aura   Heart trouble    History of  cardiac arrest    History of hiatal hernia    History of kidney stones    History of mammogram 03/25/2013; 12/07/14   BIRADS 1; NEG   History of Papanicolaou smear of cervix 03/20/12; 12/28/15   -/-; -/-   Hypercholesteremia    Hypertension    IBS (irritable bowel syndrome)    Menopausal symptoms    MI (myocardial infarction) (HCC)    Mitral valve prolapse    Pneumonia    PONV (postoperative nausea and vomiting)    nausea only   Sleep apnea    Vulvovaginitis    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    Past Surgical History:  Procedure Laterality Date   ABLATION  2013   [facility]   BACK SURGERY     BACK SURGERY  2016; 2017   L4, L5   CARDIAC SURGERY  03/19/12; 10/2014   HEART CATH AND STENT   CESAREAN SECTION     1986; 1989   COLD KNIFE CONE BIOPSY  1988   COLONOSCOPY  08/2010   16 POLYPS (ALL BENIGN) DX C CELIAC DISEASE   COLONOSCOPY N/A 02/17/2021   Procedure: COLONOSCOPY;  Surgeon: Regis Bill, MD;  Location: [facility] ENDOSCOPY;  Service: Endoscopy;  Laterality: N/A;   COMBINED HYSTEROSCOPY DIAGNOSTIC / D&C  2013   [facility]   CYSTOSCOPY W/ RETROGRADES Left 01/20/2019   Procedure: CYSTOSCOPY WITH RETROGRADE PYELOGRAM;  Surgeon: Riki Altes, MD;  Location: [facility] ORS;  Service: Urology;  Laterality: Left;   CYSTOSCOPY W/ URETERAL STENT PLACEMENT Left 12/13/2018   Procedure: CYSTOSCOPY WITH RETROGRADE PYELOGRAM/URETERAL STENT PLACEMENT;  Surgeon: Bjorn Pippin, MD;  Location: [facility] ORS;  Service: Urology;  Laterality: Left;   CYSTOSCOPY/URETEROSCOPY/HOLMIUM LASER/STENT PLACEMENT Left 01/20/2019   Procedure: CYSTOSCOPY/URETEROSCOPY/HOLMIUM LASER/STENT EXCHANGE;  Surgeon: Riki Altes, MD;  Location: [facility] ORS;  Service: Urology;  Laterality: Left;   DILATION AND CURETTAGE OF UTERUS  2001   ESOPHAGOGASTRODUODENOSCOPY  08/2010   DX C CELIAC DISEASE   ESOPHAGOGASTRODUODENOSCOPY N/A 02/17/2021   Procedure: ESOPHAGOGASTRODUODENOSCOPY (EGD);  Surgeon: Regis Bill, MD;  Location: [facility] ENDOSCOPY;  Service: Endoscopy;  Laterality: N/A;   HEART STENTS  05/27/2011   HIP SURGERY  2016   RIGHT IT BAND AND BURSA REMOVAL   OOPHORECTOMY Right 1982   TOTAL KNEE ARTHROPLASTY Left 01/19/2020   Procedure: TOTAL KNEE ARTHROPLASTY;  Surgeon: Sheral Apley, MD;  Location: [facility];  Service: Orthopedics;  Laterality: Left;   TOTAL KNEE REVISION Left 03/07/2022   Procedure: TOTAL KNEE REVISION;  Surgeon: Joen Laura, MD;  Location: [facility];  Service: Orthopedics;  Laterality: Left;    Family History  Problem Relation Age of Onset   CAD Father    Transient ischemic attack Father    Diabetes Father    Cerebrovascular Accident Father    Heart disease Father        M-MVP; F BETA;BLOCKERS   Hypothyroidism Father    Hyperthyroidism Mother    Cancer Maternal Uncle        KIDNEY   Uterine cancer Maternal Aunt 46    Social History   Socioeconomic History   Marital status: Divorced    Spouse name: Not on file   Number of children: 2   Years of education: 16   Highest education level: Not on file  Occupational History   Occupation: TEACHER  Tobacco Use   Smoking status: Never    Passive exposure: Never   Smokeless tobacco: Never  Vaping Use   Vaping status: Never Used  Substance and Sexual Activity   Alcohol use: Yes    Alcohol/week: 1.0 standard drink of alcohol    Types: 1 Standard drinks or equivalent per week   Drug use: No   Sexual activity: Not Currently    Birth control/protection: Surgical  Other Topics Concern   Not on file  Social History Narrative   Not on file   Social Determinants of Health   Financial Resource Strain: Not on file  Food Insecurity: No Food Insecurity (03/07/2022)   Hunger Vital Sign    Worried About Running Out of Food in the Last Year: Never true    Ran Out of Food in the Last Year: Never true  Transportation Needs: No Transportation Needs (03/07/2022)   PRAPARE - Transportation    Lack of  Transportation (Medical): No    Lack of Transportation (Non-Medical): No  Physical Activity: Not on file  Stress: Not on file  Social Connections: Not on file  Intimate Partner Violence: Not At Risk (03/07/2022)   Humiliation, Afraid, Rape, and Kick questionnaire    Fear of Current or Ex-Partner: No    Emotionally Abused: No    Physically Abused: No  Sexually Abused: No    Current Meds  Medication Sig   acetaminophen (TYLENOL) 500 MG tablet Take 1,000 mg by mouth as needed for moderate pain or headache.   albuterol (PROVENTIL) (2.5 MG/3ML) 0.083% nebulizer solution Take 3 mLs (2.5 mg total) by nebulization every 4 (four) hours as needed for wheezing or shortness of breath (coughing fits). (Patient taking differently: Take 2.5 mg by nebulization as needed for wheezing or shortness of breath (coughing fits).)   albuterol (VENTOLIN HFA) 108 (90 Base) MCG/ACT inhaler Inhale 2 puffs into the lungs as needed for wheezing or shortness of breath.   amoxicillin (AMOXIL) 500 MG capsule Take 2,000 mg by mouth See admin instructions. Before dental appointment   aspirin EC 81 MG tablet Take 81 mg by mouth daily. Swallow whole.   azelastine (ASTELIN) 0.1 % nasal spray Place 2 sprays into both nostrils 2 (two) times daily. Use in each nostril as directed   azelastine (ASTELIN) 0.1 % nasal spray Place 2 sprays into both nostrils 2 (two) times daily. Use in each nostril as directed   budesonide-formoterol (SYMBICORT) 80-4.5 MCG/ACT inhaler Inhale 2 puffs into the lungs in the morning and at bedtime. with spacer and rinse mouth afterwards.   cetirizine (ZYRTEC ALLERGY) 10 MG tablet Take 1 tablet (10 mg total) by mouth at bedtime.   dicyclomine (BENTYL) 10 MG capsule Take 10 mg by mouth as needed for spasms.   diphenhydrAMINE (BENADRYL ALLERGY) 25 MG tablet Take 25 mg by mouth as needed for itching.   famotidine (PEPCID) 40 MG tablet Take 40 mg by mouth at bedtime.   LORazepam (ATIVAN) 1 MG tablet Take 1 mg  by mouth 2 (two) times daily.   losartan (COZAAR) 25 MG tablet TAKE 1 TABLET (25 MG TOTAL) BY MOUTH DAILY.   Melatonin 5 MG CAPS Take 5 mg by mouth at bedtime.   montelukast (SINGULAIR) 10 MG tablet Take 10 mg by mouth daily.   nebivolol (BYSTOLIC) 5 MG tablet Take 1 tablet (5 mg total) by mouth daily with supper.   nitroGLYCERIN (NITROSTAT) 0.4 MG SL tablet Place 1 tablet (0.4 mg total) under the tongue every 5 (five) minutes as needed for chest pain.   nystatin cream (MYCOSTATIN) Apply 1 Application topically as needed (rash).   pantoprazole (PROTONIX) 20 MG tablet Take 40 mg by mouth daily.   Phenylephrine-DM-GG (TUSSIDEX PO) Take 5 mLs by mouth as needed (cough).   rosuvastatin (CRESTOR) 10 MG tablet Take 10 mg by mouth at bedtime.   triamcinolone (NASACORT) 55 MCG/ACT AERO nasal inhaler Place 1 spray into the nose 2 (two) times daily.   triamcinolone cream (KENALOG) 0.1 % Apply 1 Application topically as needed (break out).   [DISCONTINUED] clotrimazole-betamethasone (LOTRISONE) cream APPLY TO AFFECTED AREA TWICE A DAY AS NEEDED FOR SYMPTOMS FOR UP TO 2 WEEKS (Patient taking differently: Apply 1 Application topically as needed (itching).)      ROS:  Review of Systems  Constitutional:  Negative for fatigue, fever and unexpected weight change.  Respiratory:  Positive for cough. Negative for shortness of breath and wheezing.   Cardiovascular:  Negative for chest pain, palpitations and leg swelling.  Gastrointestinal:  Negative for blood in stool, constipation, diarrhea, nausea and vomiting.  Endocrine: Negative for cold intolerance, heat intolerance and polyuria.  Genitourinary:  Negative for dyspareunia, dysuria, flank pain, frequency, genital sores, hematuria, menstrual problem, pelvic pain, urgency, vaginal bleeding, vaginal discharge and vaginal pain.  Musculoskeletal:  Negative for back pain, joint swelling and myalgias.  Skin:  Positive for rash.  Neurological:  Negative for  dizziness, syncope, light-headedness, numbness and headaches.  Hematological:  Negative for adenopathy. Does not bruise/bleed easily.  Psychiatric/Behavioral:  Negative for agitation, confusion, sleep disturbance and suicidal ideas. The patient is not nervous/anxious.      Objective: BP 110/62   Ht 5\' 5"  (1.651 m)   Wt 232 lb (105.2 kg)   BMI 38.61 kg/m    Physical Exam Constitutional:      Appearance: She is well-developed.  Genitourinary:     Vulva normal.     Right Labia: rash and skin changes.     Right Labia: No tenderness or lesions.    Left Labia: skin changes and rash.     Left Labia: No tenderness or lesions.       No vaginal discharge, erythema or tenderness.      Right Adnexa: not tender and no mass present.    Left Adnexa: not tender and no mass present.    No cervical friability or polyp.     Uterus is not enlarged or tender.  Breasts:    Right: No mass, nipple discharge, skin change or tenderness.     Left: No mass, nipple discharge, skin change or tenderness.  Neck:     Thyroid: No thyromegaly.  Cardiovascular:     Rate and Rhythm: Normal rate and regular rhythm.     Heart sounds: Normal heart sounds. No murmur heard. Pulmonary:     Effort: Pulmonary effort is normal.     Breath sounds: Normal breath sounds.  Abdominal:     Palpations: Abdomen is soft.     Tenderness: There is no abdominal tenderness. There is no guarding or rebound.  Musculoskeletal:        General: Normal range of motion.     Cervical back: Normal range of motion.  Lymphadenopathy:     Cervical: No cervical adenopathy.  Neurological:     General: No focal deficit present.     Mental Status: She is alert and oriented to person, place, and time.     Cranial Nerves: No cranial nerve deficit.  Skin:    General: Skin is warm and dry.  Psychiatric:        Mood and Affect: Mood normal.        Behavior: Behavior normal.        Thought Content: Thought content normal.         Judgment: Judgment normal.  Vitals reviewed.    Assessment/Plan: Encounter for annual routine gynecological examination  Encounter for screening mammogram for malignant neoplasm of breast - Plan: MM 3D SCREENING MAMMOGRAM BILATERAL BREAST; pt to schedule mammo  Dermatitis--under panus; pt doing creams from derm.   Tinea cruris - Plan: clotrimazole-betamethasone (LOTRISONE) cream; Fungal component but also looks like area of LS post fourchette. Pt to treat with lotrisone crm BID for 2 wks, then RTO for f/u. Will bx area of possible LS if still present.   Meds ordered this encounter  Medications   clotrimazole-betamethasone (LOTRISONE) cream    Sig: APPLY TO AFFECTED AREA TWICE A DAY FOR 2 WEEKS    Dispense:  45 g    Refill:  0    Order Specific Question:   Supervising Provider    Answer:   Waymon Budge          GYN counsel breast self exam, mammography screening, menopause, adequate intake of calcium and vitamin D, diet and exercise    F/U  Return in about 2 weeks (around 09/28/2022) for GYN f/u, possible bx.  Shamell Hittle B. Yer Olivencia, PA-C 09/14/2022 12:54 PM

## 2022-09-14 ENCOUNTER — Encounter: Payer: Self-pay | Admitting: Obstetrics and Gynecology

## 2022-09-14 ENCOUNTER — Ambulatory Visit (INDEPENDENT_AMBULATORY_CARE_PROVIDER_SITE_OTHER): Payer: BC Managed Care – PPO | Admitting: Obstetrics and Gynecology

## 2022-09-14 VITALS — BP 110/62 | Ht 65.0 in | Wt 232.0 lb

## 2022-09-14 DIAGNOSIS — L309 Dermatitis, unspecified: Secondary | ICD-10-CM

## 2022-09-14 DIAGNOSIS — B356 Tinea cruris: Secondary | ICD-10-CM

## 2022-09-14 DIAGNOSIS — Z1231 Encounter for screening mammogram for malignant neoplasm of breast: Secondary | ICD-10-CM

## 2022-09-14 DIAGNOSIS — Z01419 Encounter for gynecological examination (general) (routine) without abnormal findings: Secondary | ICD-10-CM

## 2022-09-14 MED ORDER — CLOTRIMAZOLE-BETAMETHASONE 1-0.05 % EX CREA: TOPICAL_CREAM | CUTANEOUS | 0 refills | Status: DC

## 2022-09-14 NOTE — Patient Instructions (Signed)
I value your feedback and you entrusting us with your care. If you get a [facility] patient survey, I would appreciate you taking the time to let us know about your experience today. Thank you! ? ? ?

## 2022-09-25 NOTE — Progress Notes (Unsigned)
Brenda Shaggy, MD   No chief complaint on file.   HPI:      [name] is a 62 y.o. [id number] whose LMP was No LMP recorded. Patient has had an ablation., presents today for *** Treated with lotrisone crm BID for 2 wks; concern about area of LS at post frouchette 09/14/22 NOTE: Does get vaginal irritation/fissures due to moisture. Treats with lotrisone crm ext a few times with relief. Vaginal itching worse this yr, now waking at night scratching.   Patient Active Problem List   Diagnosis Date Noted   Prosthetic joint implant failure, initial encounter (HCC) 03/07/2022   Allergic contact dermatitis due to other agents 12/11/2021   Low blood pressure reading 12/11/2021   Moderate persistent asthma 12/11/2021   Other allergic rhinitis 12/11/2021   Iron deficiency 06/09/2021   Severe persistent asthma 03/01/2021   OSA (obstructive sleep apnea) 05/10/2020   Chronic respiratory failure with hypoxia (HCC) 05/10/2020   Pneumonia 05/10/2020   Hx of respiratory failure 01/21/2020   S/P total knee arthroplasty, left 01/19/2020   AKI (acute kidney injury) (HCC)    Obstruction of left ureter    Severe sepsis (HCC) 12/13/2018   Septic shock (HCC) 12/13/2018   OA (osteoarthritis) of knee 05/21/2016   Atypical chest pain 12/20/2015   Diarrhea    Chronic cough 03/28/2015   Colon polyp 03/28/2015   DU (duodenal ulcer) 03/28/2015   Hemorrhoid 03/28/2015   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/28/2015   Calculus of kidney 03/28/2015   Kidney cysts 03/28/2015   Displacement of lumbar intervertebral disc without myelopathy 02/14/2015   Angina pectoris (HCC)    CAD in native artery    Back pain    S/P coronary artery stent placement    Hyperlipidemia    Encounter for preprocedural cardiovascular examination 05/30/2014   Excess weight 08/11/2013   Celiac disease 07/22/2013   Transient blindness of right eye 05/25/2013   Acid reflux 01/17/2013   Airway  hyperreactivity 01/17/2013   Anxiety 01/17/2013   Arteriosclerosis of coronary artery 01/17/2013   HLD (hyperlipidemia) 01/17/2013   BP (high blood pressure) 01/17/2013   Atherosclerosis of coronary artery 10/31/2012   Essential hypertension 10/31/2012   Hypercholesterolemia 10/31/2012   Presence of stent in anterior descending branch of left coronary artery 10/31/2012   Presence of stent in coronary artery 10/31/2012    Past Surgical History:  Procedure Laterality Date   ABLATION  2013   [facility]   BACK SURGERY     BACK SURGERY  2016; 2017   L4, L5   CARDIAC SURGERY  03/19/12; 10/2014   HEART CATH AND STENT   CESAREAN SECTION     1986; 1989   COLD KNIFE CONE BIOPSY  1988   COLONOSCOPY  08/2010   16 POLYPS (ALL BENIGN) DX C CELIAC DISEASE   COLONOSCOPY N/A 02/17/2021   Procedure: COLONOSCOPY;  Surgeon: Regis Bill, MD;  Location: [facility] ENDOSCOPY;  Service: Endoscopy;  Laterality: N/A;   COMBINED HYSTEROSCOPY DIAGNOSTIC / D&C  2013   [facility]   CYSTOSCOPY W/ RETROGRADES Left 01/20/2019   Procedure: CYSTOSCOPY WITH RETROGRADE PYELOGRAM;  Surgeon: Riki Altes, MD;  Location: [facility];  Service: Urology;  Laterality: Left;   CYSTOSCOPY W/ URETERAL STENT PLACEMENT Left 12/13/2018   Procedure: CYSTOSCOPY WITH RETROGRADE PYELOGRAM/URETERAL STENT PLACEMENT;  Surgeon: Bjorn Pippin, MD;  Location: [facility] ORS;  Service: Urology;  Laterality: Left;   CYSTOSCOPY/URETEROSCOPY/HOLMIUM LASER/STENT PLACEMENT Left 01/20/2019  Procedure: CYSTOSCOPY/URETEROSCOPY/HOLMIUM LASER/STENT EXCHANGE;  Surgeon: Riki Altes, MD;  Location: [facility] OR[facility];  Service: Urology;  Laterality: Left;   DILATION AND CURETTAGE OF UTERUS  2001   ESOPHAGOGASTRODUODENOSCOPY  08/2010   DX C CELIAC DISEASE   ESOPHAGOGASTRODUODENOSCOPY N/A 02/17/2021   Procedure: ESOPHAGOGASTRODUODENOSCOPY (EGD);  Surgeon: Regis Bill, MD;  Location: [facility] ENDOSCOPY;  Service: Endoscopy;  Laterality: N/A;   HEART STENTS  05/27/2011   HIP  SURGERY  2016   RIGHT IT BAND AND BURSA REMOVAL   OOPHORECTOMY Right 1982   TOTAL KNEE ARTHROPLASTY Left 01/19/2020   Procedure: TOTAL KNEE ARTHROPLASTY;  Surgeon: Sheral Apley, MD;  Location: [facility];  Service: Orthopedics;  Laterality: Left;   TOTAL KNEE REVISION Left 03/07/2022   Procedure: TOTAL KNEE REVISION;  Surgeon: Joen Laura, MD;  Location: [facility];  Service: Orthopedics;  Laterality: Left;    Family History  Problem Relation Age of Onset   CAD Father    Transient ischemic attack Father    Diabetes Father    Cerebrovascular Accident Father    Heart disease Father        M-MVP; F BETA;BLOCKERS   Hypothyroidism Father    Hyperthyroidism Mother    Cancer Maternal Uncle        KIDNEY   Uterine cancer Maternal Aunt 63    Social History   Socioeconomic History   Marital status: Divorced    Spouse name: Not on file   Number of children: 2   Years of education: 16   Highest education level: Not on file  Occupational History   Occupation: TEACHER  Tobacco Use   Smoking status: Never    Passive exposure: Never   Smokeless tobacco: Never  Vaping Use   Vaping status: Never Used  Substance and Sexual Activity   Alcohol use: Yes    Alcohol/week: 1.0 standard drink of alcohol    Types: 1 Standard drinks or equivalent per week   Drug use: No   Sexual activity: Not Currently    Birth control/protection: Surgical  Other Topics Concern   Not on file  Social History Narrative   Not on file   Social Determinants of Health   Financial Resource Strain: Not on file  Food Insecurity: No Food Insecurity (03/07/2022)   Hunger Vital Sign    Worried About Running Out of Food in the Last Year: Never true    Ran Out of Food in the Last Year: Never true  Transportation Needs: No Transportation Needs (03/07/2022)   PRAPARE - Transportation    Lack of Transportation (Medical): No    Lack of Transportation (Non-Medical): No  Physical Activity: Not on file  Stress: Not  on file  Social Connections: Not on file  Intimate Partner Violence: Not At Risk (03/07/2022)   Humiliation, Afraid, Rape, and Kick questionnaire    Fear of Current or Ex-Partner: No    Emotionally Abused: No    Physically Abused: No    Sexually Abused: No    Outpatient Medications Prior to Visit  Medication Sig Dispense Refill   acetaminophen (TYLENOL) 500 MG tablet Take 1,000 mg by mouth as needed for moderate pain or headache.     albuterol (PROVENTIL) (2.5 MG/3ML) 0.083% nebulizer solution Take 3 mLs (2.5 mg total) by nebulization every 4 (four) hours as needed for wheezing or shortness of breath (coughing fits). (Patient taking differently: Take 2.5 mg by nebulization as needed for wheezing or shortness of breath (coughing fits).) 75 mL 1  albuterol (VENTOLIN HFA) 108 (90 Base) MCG/ACT inhaler Inhale 2 puffs into the lungs as needed for wheezing or shortness of breath.     amoxicillin (AMOXIL) 500 MG capsule Take 2,000 mg by mouth See admin instructions. Before dental appointment     aspirin EC 81 MG tablet Take 81 mg by mouth daily. Swallow whole.     azelastine (ASTELIN) 0.1 % nasal spray Place 2 sprays into both nostrils 2 (two) times daily. Use in each nostril as directed 30 mL 1   azelastine (ASTELIN) 0.1 % nasal spray Place 2 sprays into both nostrils 2 (two) times daily. Use in each nostril as directed 30 mL 12   budesonide-formoterol (SYMBICORT) 80-4.5 MCG/ACT inhaler Inhale 2 puffs into the lungs in the morning and at bedtime. with spacer and rinse mouth afterwards. 1 each 5   cetirizine (ZYRTEC ALLERGY) 10 MG tablet Take 1 tablet (10 mg total) by mouth at bedtime. 30 tablet 6   clotrimazole-betamethasone (LOTRISONE) cream APPLY TO AFFECTED AREA TWICE A DAY FOR 2 WEEKS 45 g 0   dicyclomine (BENTYL) 10 MG capsule Take 10 mg by mouth as needed for spasms.     diphenhydrAMINE (BENADRYL ALLERGY) 25 MG tablet Take 25 mg by mouth as needed for itching.     famotidine (PEPCID) 40 MG  tablet Take 40 mg by mouth at bedtime.     LORazepam (ATIVAN) 1 MG tablet Take 1 mg by mouth 2 (two) times daily.     losartan (COZAAR) 25 MG tablet TAKE 1 TABLET (25 MG TOTAL) BY MOUTH DAILY. 90 tablet 2   Melatonin 5 MG CAPS Take 5 mg by mouth at bedtime.     montelukast (SINGULAIR) 10 MG tablet Take 10 mg by mouth daily.     nebivolol (BYSTOLIC) 5 MG tablet Take 1 tablet (5 mg total) by mouth daily with supper. 90 tablet 3   nitroGLYCERIN (NITROSTAT) 0.4 MG SL tablet Place 1 tablet (0.4 mg total) under the tongue every 5 (five) minutes as needed for chest pain. 25 tablet 3   nystatin cream (MYCOSTATIN) Apply 1 Application topically as needed (rash).     pantoprazole (PROTONIX) 20 MG tablet Take 40 mg by mouth daily.     Phenylephrine-DM-GG (TUSSIDEX PO) Take 5 mLs by mouth as needed (cough).     rosuvastatin (CRESTOR) 10 MG tablet Take 10 mg by mouth at bedtime.     triamcinolone (NASACORT) 55 MCG/ACT AERO nasal inhaler Place 1 spray into the nose 2 (two) times daily. 1 each 2   triamcinolone cream (KENALOG) 0.1 % Apply 1 Application topically as needed (break out).     No facility-administered medications prior to visit.      ROS:  Review of Systems BREAST: No symptoms   OBJECTIVE:   Vitals:  There were no vitals taken for this visit.  Physical Exam  Results: No results found for this or any previous visit (from the past 24 hour(s)).   Assessment/Plan: No diagnosis found.    No orders of the defined types were placed in this encounter.     No follow-ups on file.  Carlye Panameno B. Asaad Gulley, PA-C 09/25/2022 12:41 PM

## 2022-09-27 ENCOUNTER — Ambulatory Visit: Payer: BC Managed Care – PPO | Admitting: Obstetrics and Gynecology

## 2022-09-27 ENCOUNTER — Encounter: Payer: Self-pay | Admitting: Obstetrics and Gynecology

## 2022-09-27 ENCOUNTER — Other Ambulatory Visit (HOSPITAL_COMMUNITY)
Admission: RE | Admit: 2022-09-27 | Discharge: 2022-09-27 | Disposition: A | Discharge status: Home / Self Care | Payer: BC Managed Care – PPO | Source: Ambulatory Visit | Attending: Obstetrics and Gynecology | Admitting: Obstetrics and Gynecology

## 2022-09-27 VITALS — BP 95/63 | HR 69 | Ht 65.0 in | Wt 232.0 lb

## 2022-09-27 DIAGNOSIS — L900 Lichen sclerosus et atrophicus: Secondary | ICD-10-CM | POA: Insufficient documentation

## 2022-09-27 DIAGNOSIS — N904 Leukoplakia of vulva: Secondary | ICD-10-CM | POA: Diagnosis not present

## 2022-09-27 DIAGNOSIS — N898 Other specified noninflammatory disorders of vagina: Secondary | ICD-10-CM | POA: Diagnosis not present

## 2022-09-27 NOTE — Patient Instructions (Signed)
I value your feedback and you entrusting us with your care. If you get a [facility] patient survey, I would appreciate you taking the time to let us know about your experience today. Thank you! ? ? ?

## 2022-10-01 LAB — SURGICAL PATHOLOGY

## 2022-10-02 ENCOUNTER — Telehealth: Payer: Self-pay

## 2022-10-02 MED ORDER — CLOBETASOL PROPIONATE 0.05 % EX OINT: TOPICAL_OINTMENT | CUTANEOUS | 0 refills | Status: AC

## 2022-10-02 NOTE — Addendum Note (Signed)
Addended by: Althea Grimmer B on: 10/02/2022 08:12 AM   Modules accepted: Orders

## 2022-10-02 NOTE — Telephone Encounter (Signed)
Pt left msg on triage saying she saw [facility] last week and had a biopsy done, last night she started with a fever since she was told to look out for any symptoms. I called her back to get more details and fever started last night, has been taking 2 tylenols every 4 hours and fever has not gone away completely. Last time she checked it this morning she had temperature of 100.0. I asked her if she had any abnormal vag sx: vag odor or weird looking fluid/discharge and she said no. She thinks it might be a bug she picked up cause she started with a cough this morning. I advised her that that's what it sounds like but I would still let [facility] know.

## 2022-10-02 NOTE — Telephone Encounter (Signed)
Sx not c/w infection from bx. Pt to do covid and flu test. Hope she feels better. Can f/u with PCP or UC prn.

## 2022-10-02 NOTE — Telephone Encounter (Signed)
Pt aware. Covid test at home negative and she said she will see her PCP doctor tomorrow.

## 2022-10-09 ENCOUNTER — Other Ambulatory Visit: Payer: Self-pay | Admitting: Internal Medicine

## 2022-10-09 ENCOUNTER — Ambulatory Visit
Admission: RE | Admit: 2022-10-09 | Discharge: 2022-10-09 | Disposition: A | Discharge status: Home / Self Care | Payer: BC Managed Care – PPO | Attending: Internal Medicine | Admitting: Internal Medicine

## 2022-10-09 ENCOUNTER — Ambulatory Visit
Admission: RE | Admit: 2022-10-09 | Discharge: 2022-10-09 | Disposition: A | Discharge status: Home / Self Care | Payer: BC Managed Care – PPO | Source: Ambulatory Visit | Attending: Internal Medicine | Admitting: Internal Medicine

## 2022-10-09 DIAGNOSIS — R059 Cough, unspecified: Secondary | ICD-10-CM | POA: Insufficient documentation

## 2022-10-19 ENCOUNTER — Encounter: Payer: Self-pay | Admitting: Primary Care

## 2022-10-19 ENCOUNTER — Ambulatory Visit: Payer: BC Managed Care – PPO | Admitting: Primary Care

## 2022-10-19 VITALS — BP 102/66 | HR 67 | Temp 98.1°F | Ht 65.0 in | Wt 233.6 lb

## 2022-10-19 DIAGNOSIS — G4733 Obstructive sleep apnea (adult) (pediatric): Secondary | ICD-10-CM

## 2022-10-19 DIAGNOSIS — R053 Chronic cough: Secondary | ICD-10-CM

## 2022-10-19 LAB — NITRIC OXIDE: Nitric Oxide: 12

## 2022-10-19 MED ORDER — GABAPENTIN 100 MG PO CAPS: 100.0000 mg | ORAL_CAPSULE | Freq: Three times a day (TID) | ORAL | 1 refills | Status: DC

## 2022-10-19 NOTE — Assessment & Plan Note (Addendum)
Patient has had a chronic cough for > 10 years. Minimal bronchiectasis at lung bases on prior CT imaging. Cough has been resistant to standard treatment. She has been tried on ICS/LABA inhaler, nasal sprays and reflux medication without significant improvement. Cough is productive at times, worse in the morning. She is currently on Augmentin and prednisone prescribed by her primary care provider. CXR on 10/09/22 without acute process. FENO was normal, 12. We discussed undergoing bronchoscopy for further evaluation but she does not want to pursue this due to possible complications for procedure. 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 Recommend ENT eval. May benefit from speech therapy.   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 Cough has been resistant to standard treatment. Discuss other treatment options for chronic cough with ENT such as botox injections vs nerve block (recommend they also assess for laryngeal pharyngeal reflux, may want to consider do a laryngoscopy)   Orders: - Sputum culture (Respiratory culture, AFB, Fungal) - FENO  re: cough  - Flutter valve   Follow-up: - 6-8 weeks with Dr. Belia Heman

## 2022-10-19 NOTE — Assessment & Plan Note (Addendum)
-   OSA well-controlled on auto CPAP, pressure settings was supposed to be lowered at last OV - Current pressure 5-10cm h20; AHI 1.5/hour - DME order placed to change auto settings to 4-8cm h20

## 2022-10-19 NOTE — Progress Notes (Signed)
@Patient  ID: Brenda Craig, female    DOB: 04-Dec-1960, 62 y.o.   MRN: [id number]  Chief Complaint  Patient presents with   Follow-up    Wears CPAP nightly. Cough, shortness of breath and wheezing. Currently on prednisone and Augmentin.     Referring provider: Jaclyn Shaggy, MD   SYNOPSIS [name] is referred for evaluation of chronic cough of many years duration. She dates it back to 2009 or 2010. Her cough has been intermittent but has to be worse in the spring autumn and winter. DX with ASTHMA DX of GERD DX of OSA S/p cardiac arrest 2013 AHI of 21  PREVIOUS HISTORY Patient with severe persistent cough  Patient has a chronic cough for the last 10 years She has been worked up multiple times at [facility] [facility] [employer] She has had ENT referrals in the past with extensive work-up   At this time no one can tell her what is causing her cough Patient is requesting refill on Tussionex I have explained to patient we no longer prescribe narcotic-based antitussive medicines    Exposed to cats and dogs at home She states she is not allergic but would like a referral for assessment   Patient had normal eosinophil levels of 100 Patient is not a candidate for immunological or biological therapy    CC  Follow up assessment of OSA Follow up Cough   Previous [facility] pulmonary encounter:  08/22/2022 Patient presents today for OSA follow-up/ OSA and chronic cough.  Assessment of download shows great compliance Well-controlled OSA With auto CPAP 5-10 She has hx chronic cough for >10 years, currently productive cough with yellow sputum. No associated shortness of breath. She has not needed to use Albuterol.  I believe inhaler therapy is not working  Regarding chronic cough Patient has significant allergic rhinitis as well as severe reflux disease along with a history of hiatal hernia in the setting of obesity and restrictive lung disease with previous history of COVID infection 3  times in the past several years   No exacerbation at this time No evidence of heart failure at this time No evidence or signs of infection at this time No respiratory distress No fevers, chills, nausea, vomiting, diarrhea No evidence of lower extremity edema No evidence hemoptysis   10/19/2022- interim hx Patient was seen by Dr. Belia Heman in August for chronic cough, hyperreactive airway due to allergic rhinitis.  She has had a chronic cough for 10 years.  Imaging showed scarring to right middle lung and lower left lung. She was started on cetirizine 10 mg at bedtime along with Astelin nasal spray 2 sprays per nostril twice daily.  If cough does not improve next steps would be to start patient on gabapentin and to have patient undergo bronchoscopy for assessment Longer taking Symbicort, using albuterol as needed  She is using nasal spray and reflux medication as directed She is using albuterol hfa 2-4 times a day and neb twice a day on average When she uses rescue inhaler she does noticed improvement No clear benefit from Symbicort when she was on it. She has tried several other inhalers. She is currently on her third antibiotics and second course of prednisone, prescribed  by PCP Most fo the times she responds to oral prednisone but hasn't this round  She had a chest xray 10/09/22 showed no evidence for acute cardiopulmonary disease She is taking tussionex at night time  Declines bronchoscopy due to possible complications from procedure   [facility]  compliance download 09/18/22 - 10/17/2022 Usage days 24/30 days (80%): 23 days (77%) greater than 4 hours Average usage days used 7 hours 19 minutes Pressure 5 to 10 cm H2O (9.7 m H2O-95%) Air leaks 4.6 L/min (95%) AHI 1.5   Imaging:  JANUARY 2024 CT CHEST Generally bland appearing, bandlike scarring and or atelectasis of the bilateral lung bases as well as of the anterior right upper lobe. Minimal dependent bibasilar ground-glass  without evident interlobular septal thickening or subpleural bronchiolectasis. Minimal, tubular bronchiectasis at the lung bases. Findings are not significantly changed compared to examinations dating back to 05/21/2016. Findings are most consistent with sequelae of prior infection or aspiration without specific evidence of fibrotic interstitial lung disease  CT of the chest reviewed in detail with the patient Next step is to perform bronchoscopy however patient is unwilling to do this at this time  Allergies  Allergen Reactions   Other Other (See Comments)    Gluten allergy Celiac    Codeine Nausea And Vomiting and Rash   Esomeprazole Magnesium Palpitations and Other (See Comments)    IRREGULAR HEARTBEAT   Iodinated Contrast Media Itching   Omeprazole Magnesium Palpitations and Other (See Comments)    IRREGULAR HEARTBEAT   Oxycodone-Acetaminophen Itching, Nausea And Vomiting and Rash    Can take with benadryl   Percocet [Oxycodone-Acetaminophen] Itching, Nausea And Vomiting and Rash    Can take with benadryl    Tramadol Rash    Can take with benadryl     Immunization History  Administered Date(s) Administered   Influenza,inj,Quad PF,6+ Mos 05/25/2013, 11/13/2017, 10/17/2018   Influenza-Unspecified 10/10/2020, 10/08/2021   PFIZER Comirnaty(Gray Top)Covid-19 Tri-Sucrose Vaccine 05/06/2020   PFIZER(Purple Top)SARS-COV-2 Vaccination 04/16/2019, 05/07/2019   Pneumococcal Polysaccharide-23 12/16/2018    Past Medical History:  Diagnosis Date   Abnormal Pap smear of cervix 1988   Acid reflux 01/17/2013   Allergic rhinitis    Anemia    Anginal pain (HCC)    Anxiety    Arthritis    Asthma    Bronchiectasis (HCC)    Celiac disease    Cervical dysplasia 1988   CHF (congestive heart failure) (HCC)    Chronic kidney disease    Complication of anesthesia    bp drops after anesthesia and has to be kept overnight   Coronary artery disease    Headache    migraine with visual  aura   Heart trouble    History of cardiac arrest    History of hiatal hernia    History of kidney stones    History of mammogram 03/25/2013; 12/07/14   BIRADS 1; NEG   History of Papanicolaou smear of cervix 03/20/12; 12/28/15   -/-; -/-   Hypercholesteremia    Hypertension    IBS (irritable bowel syndrome)    Menopausal symptoms    MI (myocardial infarction) (HCC)    Mitral valve prolapse    Pneumonia    PONV (postoperative nausea and vomiting)    nausea only   Sleep apnea    Vulvovaginitis    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     albuterol (PROVENTIL) (2.5  MG/3ML) 0.083% nebulizer solution Take 3 mLs (2.5 mg total) by nebulization every 4 (four) hours as needed for wheezing or shortness of breath (coughing fits). (Patient taking differently: Take 2.5 mg by nebulization as needed for wheezing or shortness of breath (coughing fits).) 75 mL 1   albuterol (VENTOLIN HFA) 108 (90 Base) MCG/ACT inhaler Inhale 2 puffs into the lungs as needed for wheezing or shortness of breath.     amoxicillin-clavulanate (AUGMENTIN) 875-125 MG tablet Take 1 tablet by mouth 2 (two) times daily.     aspirin EC 81 MG tablet Take 81 mg by mouth daily. Swallow whole.     azelastine (ASTELIN) 0.1 % nasal spray Place 2 sprays into both nostrils 2 (two) times daily. Use in each nostril as directed 30 mL 12   benzonatate (TESSALON) 200 MG capsule Take 400 mg by mouth every 8 (eight) hours as needed.     cetirizine (ZYRTEC ALLERGY) 10 MG tablet Take 1 tablet (10 mg total) by mouth at bedtime. 30 tablet 6   chlorpheniramine-HYDROcodone (TUSSIONEX) 10-8 MG/5ML  Take 5 mLs by mouth at bedtime as needed for cough.     clobetasol ointment (TEMOVATE) 0.05 % Apply to affected area every night for 4 weeks, then every other day for 4 weeks and then 1-2 times a week for maintenance 45 g 0   dicyclomine (BENTYL) 10 MG capsule Take 10 mg by mouth as needed for spasms.     diphenhydrAMINE (BENADRYL ALLERGY) 25 MG tablet Take 25 mg by mouth as needed for itching.     famotidine (PEPCID) 40 MG tablet Take 40 mg by mouth at bedtime.     LORazepam (ATIVAN) 1 MG tablet Take 1 mg by mouth 2 (two) times daily.     losartan (COZAAR) 25 MG tablet TAKE 1 TABLET (25 MG TOTAL) BY MOUTH DAILY. 90 tablet 2   Melatonin 5 MG CAPS Take 5 mg by mouth at bedtime.     montelukast (SINGULAIR) 10 MG tablet Take 10 mg by mouth daily.     nebivolol (BYSTOLIC) 5 MG tablet Take 1 tablet (5 mg total) by mouth daily with supper. 90 tablet 3   nitroGLYCERIN (NITROSTAT) 0.4 MG SL tablet Place 1 tablet (0.4 mg total) under the tongue every 5 (five) minutes as needed for chest pain. 25 tablet 3   nystatin cream (MYCOSTATIN) Apply 1 Application topically as needed (rash).     pantoprazole (PROTONIX) 20 MG tablet Take 40 mg by mouth daily.     Phenylephrine-DM-GG (TUSSIDEX PO) Take 5 mLs by mouth as needed (cough).     predniSONE (DELTASONE) 10 MG tablet Take 20 mg by mouth daily with breakfast.     rosuvastatin (CRESTOR) 10 MG tablet Take 10 mg by mouth at bedtime.     triamcinolone (NASACORT) 55 MCG/ACT AERO nasal inhaler Place 1 spray into the nose 2 (two) times daily. 1 each 2   triamcinolone cream (KENALOG) 0.1 % Apply 1 Application topically as needed (break out).     amoxicillin (AMOXIL) 500 MG capsule Take 2,000 mg by mouth See admin instructions. Before dental appointment (Patient not taking: Reported on 10/19/2022)     No facility-administered medications prior to visit.   Review of Systems  Review of Systems  Constitutional: Negative.   HENT: Negative.    Respiratory:  Positive  for cough.   Cardiovascular: Negative.     Physical Exam  BP 102/66 (BP Location: Left Arm, Patient Position: Sitting, Cuff Size: Normal)  Pulse 67   Temp 98.1 F (36.7 C) (Temporal)   Ht 5\' 5"  (1.651 m)   Wt 233 lb 9.6 oz (106 kg)   SpO2 94%   BMI 38.87 kg/m  Physical Exam Constitutional:      Appearance: Normal appearance.  Cardiovascular:     Rate and Rhythm: Normal rate.  Pulmonary:     Comments: CTA; Reactive cough  Musculoskeletal:        General: Normal range of motion.  Skin:    General: Skin is warm and dry.  Neurological:     General: No focal deficit present.     Mental Status: She is alert and oriented to person, place, and time. Mental status is at baseline.  Psychiatric:        Mood and Affect: Mood normal.        Behavior: Behavior normal.        Thought Content: Thought content normal.        Judgment: Judgment normal.      Lab Results:  CBC    Component Value Date/Time   WBC 8.5 06/22/2022 1153   RBC 4.62 06/22/2022 1153   RBC 4.52 06/22/2022 1153   HGB 12.6 06/22/2022 1153   HGB 11.9 (L) 06/10/2013 0209   HCT 40.1 06/22/2022 1153   HCT 36.1 06/10/2013 0209   PLT 225 06/22/2022 1153   PLT 214 06/10/2013 0209   MCV 88.7 06/22/2022 1153   MCV 92 06/10/2013 0209   MCH 27.9 06/22/2022 1153   MCHC 31.4 06/22/2022 1153   RDW 14.3 06/22/2022 1153   RDW 15.2 (H) 06/10/2013 0209   LYMPHSABS 2.0 06/22/2022 1153   LYMPHSABS 2.0 06/10/2013 0209   MONOABS 0.9 06/22/2022 1153   MONOABS 0.7 06/10/2013 0209   EOSABS 0.1 06/22/2022 1153   EOSABS 0.1 06/10/2013 0209   BASOSABS 0.1 06/22/2022 1153   BASOSABS 0.0 06/10/2013 0209    BMET    Component Value Date/Time   NA 134 (L) 03/08/2022 0308   NA 143 06/10/2013 0209   K 4.6 03/08/2022 0308   K 3.7 06/10/2013 0209   CL 104 03/08/2022 0308   CL 109 (H) 06/10/2013 0209   CO2 25 03/08/2022 0308   CO2 29 06/10/2013 0209   GLUCOSE 148 (H) 03/08/2022 0308   GLUCOSE 90 06/10/2013 0209   BUN 16  03/08/2022 0308   BUN 14 06/10/2013 0209   CREATININE 0.87 03/08/2022 0308   CREATININE 0.87 06/10/2013 0209   CALCIUM 8.3 (L) 03/08/2022 0308   CALCIUM 8.9 06/10/2013 0209   GFRNONAA >60 03/08/2022 0308   GFRNONAA >60 06/10/2013 0209   GFRAA >60 12/18/2018 0343   GFRAA >60 06/10/2013 0209    BNP    Component Value Date/Time   BNP 40.0 01/22/2020 0832    ProBNP No results found for: "PROBNP"  Imaging: DG Chest 2 View  Result Date: 10/12/2022 CLINICAL DATA:  Cough, chest pain and shortness of breath x1 week. EXAM: CHEST - 2 VIEW COMPARISON:  Chest radiographs 06/27/2020. FINDINGS: No consolidation or pulmonary edema. Unchanged scarring and calcified granulomata in the lower lungs. Normal heart size and mediastinal contours. No pleural effusion or pneumothorax. IMPRESSION: No evidence of acute cardiopulmonary disease. Electronically Signed   By: Orvan Falconer M.D.   On: 10/12/2022 09:45     Assessment & Plan:   Chronic cough Patient has had a chronic cough for > 10 years. Minimal bronchiectasis at lung bases on prior CT imaging. Cough has been resistant to  standard treatment. She has been tried on ICS/LABA inhaler, nasal sprays and reflux medication without significant improvement. Cough is productive at times, worse in the morning. She is currently on Augmentin and prednisone prescribed by her primary care provider. CXR on 10/09/22 without acute process. FENO was normal, 12. We discussed undergoing bronchoscopy for further evaluation but she does not want to pursue this due to possible complications for procedure. 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 Recommend ENT eval. May benefit from speech therapy.   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 Cough has been resistant to standard treatment. Discuss other treatment options for chronic cough with ENT such as botox injections vs nerve block (recommend they also assess for laryngeal pharyngeal reflux, may want to consider do a laryngoscopy)   Orders: - Sputum culture (Respiratory culture, AFB, Fungal) - FENO  re: cough  - Flutter valve   Follow-up: - 6-8 weeks with Dr. Belia Heman    Acid reflux -Continue PPI twice daily along with Pepcid twice daily  OSA (obstructive sleep apnea) - OSA well-controlled on auto CPAP, pressure settings was supposed to be lowered at last OV - Current pressure 5-10cm h20; AHI 1.5/hour - DME order placed to change auto settings to 4-8cm h20      Glenford Bayley, NP 10/19/2022

## 2022-10-19 NOTE — Patient Instructions (Addendum)
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 Cough has been resistant to standard treatment. Discuss other treatment options for chronic cough with ENT such as botox injections vs nerve block (recommend they also assess for laryngeal pharyngeal reflux, may want to consider do a laryngoscopy)   Orders: - Sputum culture (Respiratory culture, AFB, Fungal) - FENO  re: cough  - Flutter valve   Follow-up: - 6-8 weeks with Dr. Belia Heman

## 2022-10-19 NOTE — Assessment & Plan Note (Signed)
-  Continue PPI twice daily along with Pepcid twice daily

## 2022-10-31 ENCOUNTER — Ambulatory Visit: Payer: BC Managed Care – PPO | Admitting: Internal Medicine

## 2022-11-02 ENCOUNTER — Other Ambulatory Visit: Payer: Self-pay | Admitting: Cardiovascular Disease

## 2022-12-23 ENCOUNTER — Other Ambulatory Visit: Payer: Self-pay | Admitting: Cardiovascular Disease

## 2022-12-24 NOTE — Telephone Encounter (Signed)
Good Morning,  Could you please schedule this patient a 12 month follow up visit? The patient was last seen by Dr. Mariah Milling on 12-29-21. Thank you so much.

## 2022-12-25 ENCOUNTER — Telehealth: Payer: Self-pay | Admitting: Cardiovascular Disease

## 2022-12-25 NOTE — Telephone Encounter (Signed)
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

## 2022-12-25 NOTE — Telephone Encounter (Signed)
 Left voice mail, pt needs appt scheduled from recall.

## 2022-12-26 NOTE — Telephone Encounter (Signed)
Pt scheduled on 2/28.

## 2023-01-07 ENCOUNTER — Encounter: Payer: Self-pay | Admitting: Oncology

## 2023-02-15 ENCOUNTER — Other Ambulatory Visit: Payer: Self-pay | Admitting: Internal Medicine

## 2023-02-15 DIAGNOSIS — J309 Allergic rhinitis, unspecified: Secondary | ICD-10-CM

## 2023-02-19 ENCOUNTER — Emergency Department: Payer: Self-pay

## 2023-02-19 ENCOUNTER — Encounter: Payer: Self-pay | Admitting: Emergency Medicine

## 2023-02-19 ENCOUNTER — Observation Stay
Admission: EM | Admit: 2023-02-19 | Discharge: 2023-02-22 | Disposition: A | Discharge status: Home / Self Care | Payer: 59 | Attending: Hospitalist | Admitting: Hospitalist

## 2023-02-19 ENCOUNTER — Other Ambulatory Visit: Payer: Self-pay

## 2023-02-19 DIAGNOSIS — E785 Hyperlipidemia, unspecified: Secondary | ICD-10-CM | POA: Insufficient documentation

## 2023-02-19 DIAGNOSIS — R1013 Epigastric pain: Secondary | ICD-10-CM

## 2023-02-19 DIAGNOSIS — Z79899 Other long term (current) drug therapy: Secondary | ICD-10-CM | POA: Diagnosis not present

## 2023-02-19 DIAGNOSIS — Z96652 Presence of left artificial knee joint: Secondary | ICD-10-CM | POA: Diagnosis not present

## 2023-02-19 DIAGNOSIS — J9601 Acute respiratory failure with hypoxia: Secondary | ICD-10-CM | POA: Diagnosis not present

## 2023-02-19 DIAGNOSIS — Z8674 Personal history of sudden cardiac arrest: Secondary | ICD-10-CM | POA: Insufficient documentation

## 2023-02-19 DIAGNOSIS — I25118 Atherosclerotic heart disease of native coronary artery with other forms of angina pectoris: Secondary | ICD-10-CM | POA: Diagnosis not present

## 2023-02-19 DIAGNOSIS — R0902 Hypoxemia: Secondary | ICD-10-CM

## 2023-02-19 DIAGNOSIS — R1011 Right upper quadrant pain: Secondary | ICD-10-CM | POA: Diagnosis not present

## 2023-02-19 DIAGNOSIS — M545 Low back pain, unspecified: Secondary | ICD-10-CM | POA: Insufficient documentation

## 2023-02-19 DIAGNOSIS — J45901 Unspecified asthma with (acute) exacerbation: Secondary | ICD-10-CM | POA: Diagnosis not present

## 2023-02-19 DIAGNOSIS — Z7982 Long term (current) use of aspirin: Secondary | ICD-10-CM | POA: Diagnosis not present

## 2023-02-19 DIAGNOSIS — I10 Essential (primary) hypertension: Secondary | ICD-10-CM | POA: Diagnosis present

## 2023-02-19 DIAGNOSIS — K219 Gastro-esophageal reflux disease without esophagitis: Secondary | ICD-10-CM | POA: Insufficient documentation

## 2023-02-19 DIAGNOSIS — I509 Heart failure, unspecified: Secondary | ICD-10-CM | POA: Diagnosis not present

## 2023-02-19 DIAGNOSIS — I130 Hypertensive heart and chronic kidney disease with heart failure and stage 1 through stage 4 chronic kidney disease, or unspecified chronic kidney disease: Secondary | ICD-10-CM | POA: Diagnosis not present

## 2023-02-19 DIAGNOSIS — I200 Unstable angina: Secondary | ICD-10-CM | POA: Diagnosis present

## 2023-02-19 DIAGNOSIS — F419 Anxiety disorder, unspecified: Secondary | ICD-10-CM | POA: Diagnosis present

## 2023-02-19 DIAGNOSIS — N189 Chronic kidney disease, unspecified: Secondary | ICD-10-CM | POA: Diagnosis not present

## 2023-02-19 DIAGNOSIS — R079 Chest pain, unspecified: Secondary | ICD-10-CM | POA: Diagnosis present

## 2023-02-19 DIAGNOSIS — J45909 Unspecified asthma, uncomplicated: Secondary | ICD-10-CM | POA: Insufficient documentation

## 2023-02-19 HISTORY — DX: Acute respiratory failure with hypoxia: J96.01

## 2023-02-19 LAB — HEPATIC FUNCTION PANEL
ALT: 24 U/L (ref 0–44)
AST: 24 U/L (ref 15–41)
Albumin: 3.8 g/dL (ref 3.5–5.0)
Alkaline Phosphatase: 161 U/L — ABNORMAL HIGH (ref 38–126)
Bilirubin, Direct: 0.1 mg/dL (ref 0.0–0.2)
Indirect Bilirubin: 0.5 mg/dL (ref 0.3–0.9)
Total Bilirubin: 0.6 mg/dL (ref 0.0–1.2)
Total Protein: 6.9 g/dL (ref 6.5–8.1)

## 2023-02-19 LAB — CBC
HCT: 42.9 % (ref 36.0–46.0)
Hemoglobin: 13.4 g/dL (ref 12.0–15.0)
MCH: 28.2 pg (ref 26.0–34.0)
MCHC: 31.2 g/dL (ref 30.0–36.0)
MCV: 90.3 fL (ref 80.0–100.0)
Platelets: 276 10*3/uL (ref 150–400)
RBC: 4.75 MIL/uL (ref 3.87–5.11)
RDW: 13.9 % (ref 11.5–15.5)
WBC: 7.6 10*3/uL (ref 4.0–10.5)
nRBC: 0 % (ref 0.0–0.2)

## 2023-02-19 LAB — URINALYSIS, W/ REFLEX TO CULTURE (INFECTION SUSPECTED)
Bacteria, UA: NONE SEEN
Bilirubin Urine: NEGATIVE
Glucose, UA: NEGATIVE mg/dL
Ketones, ur: NEGATIVE mg/dL
Leukocytes,Ua: NEGATIVE
Nitrite: NEGATIVE
Protein, ur: NEGATIVE mg/dL
Specific Gravity, Urine: 1.008 (ref 1.005–1.030)
pH: 5 (ref 5.0–8.0)

## 2023-02-19 LAB — BASIC METABOLIC PANEL
Anion gap: 11 (ref 5–15)
BUN: 20 mg/dL (ref 8–23)
CO2: 23 mmol/L (ref 22–32)
Calcium: 8.6 mg/dL — ABNORMAL LOW (ref 8.9–10.3)
Chloride: 101 mmol/L (ref 98–111)
Creatinine, Ser: 1.06 mg/dL — ABNORMAL HIGH (ref 0.44–1.00)
GFR, Estimated: 59 mL/min — ABNORMAL LOW (ref >60)
Glucose, Bld: 134 mg/dL — ABNORMAL HIGH (ref 70–99)
Potassium: 3.5 mmol/L (ref 3.5–5.1)
Sodium: 135 mmol/L (ref 135–145)

## 2023-02-19 LAB — TROPONIN I (HIGH SENSITIVITY)
Troponin I (High Sensitivity): 4 ng/L (ref <18)
Troponin I (High Sensitivity): 4 ng/L (ref <18)

## 2023-02-19 LAB — BRAIN NATRIURETIC PEPTIDE: B Natriuretic Peptide: 41.4 pg/mL (ref 0.0–100.0)

## 2023-02-19 LAB — LIPASE, BLOOD: Lipase: 29 U/L (ref 11–51)

## 2023-02-19 MED ORDER — METHYLPREDNISOLONE SODIUM SUCC 40 MG IJ SOLR
40.0000 mg | Freq: Once | INTRAMUSCULAR | Status: AC
  Administered 2023-02-19: 40 mg via INTRAVENOUS
  Filled 2023-02-19: qty 1

## 2023-02-19 MED ORDER — ONDANSETRON HCL 4 MG/2ML IJ SOLN
4.0000 mg | Freq: Once | INTRAMUSCULAR | Status: AC | PRN
  Administered 2023-02-20: 4 mg via INTRAVENOUS
  Filled 2023-02-19: qty 2

## 2023-02-19 MED ORDER — LACTATED RINGERS IV BOLUS
500.0000 mL | Freq: Once | INTRAVENOUS | Status: AC
  Administered 2023-02-19: 500 mL via INTRAVENOUS

## 2023-02-19 MED ORDER — IOHEXOL 350 MG/ML SOLN
100.0000 mL | Freq: Once | INTRAVENOUS | Status: AC | PRN
  Administered 2023-02-19: 100 mL via INTRAVENOUS

## 2023-02-19 MED ORDER — ALUM & MAG HYDROXIDE-SIMETH 200-200-20 MG/5ML PO SUSP
30.0000 mL | Freq: Once | ORAL | Status: AC
  Administered 2023-02-19: 30 mL via ORAL
  Filled 2023-02-19: qty 30

## 2023-02-19 MED ORDER — DIPHENHYDRAMINE HCL 50 MG/ML IJ SOLN
50.0000 mg | Freq: Once | INTRAMUSCULAR | Status: AC
  Administered 2023-02-19: 50 mg via INTRAVENOUS
  Filled 2023-02-19: qty 1

## 2023-02-19 MED ORDER — LIDOCAINE VISCOUS HCL 2 % MT SOLN
15.0000 mL | Freq: Once | OROMUCOSAL | Status: AC
  Administered 2023-02-19: 15 mL via ORAL
  Filled 2023-02-19: qty 15

## 2023-02-19 NOTE — ED Notes (Signed)
Pt reports chest pain, abd pain, back pain and near syncope today.  Intermittent nausea.  Pt reports hx cardiac stents.  Md at bedside.  Pt alert.  Nsr on monitor

## 2023-02-19 NOTE — ED Triage Notes (Signed)
Patient to ED via [facility] from work for CP that started approx 1345. States 5/10 non-radiating, dull pain. Also having lower back pain with indigestion. Denies blood thinners. Given 324 ASA and 4mg  zofran.   EMS VS: 82 HR 95% RA 119/64

## 2023-02-19 NOTE — ED Notes (Signed)
Meds given.  Iv fluids infusing.  Pt alert.

## 2023-02-19 NOTE — ED Provider Triage Note (Signed)
Emergency Medicine Provider Triage Evaluation Note  Brenda Craig , a 63 y.o. female  was evaluated in triage.  Pt complains of midsternal chest pain and pressure as well as nausea, indigestion, and low back pain. Indigestion and back pain x 2 days. Chest pain and nausea just PTA.   Physical Exam  Ht 5\' 5"  (1.651 m)   Wt 102.1 kg   BMI 37.44 kg/m  Gen:   Awake, no distress   Resp:  Normal effort  MSK:   Moves extremities without difficulty  Other:    Medical Decision Making  Medically screening exam initiated at 2:17 PM.  Appropriate orders placed.  Brenda Craig was informed that the remainder of the evaluation will be completed by another provider, this initial triage assessment does not replace that evaluation, and the importance of remaining in the ED until their evaluation is complete.  Complex cardiac history. Protocols started.   Brenda Pester, FNP 02/19/23 1557

## 2023-02-19 NOTE — ED Provider Notes (Signed)
[facility] Provider Note    Event Date/Time   First MD Initiated Contact with Patient 02/19/23 1821     (approximate)   History   Chest Pain   HPI  Brenda Craig is a 63 y.o. female with history of CAD, cardiac arrest in 2014 secondary to STEMI complicated by ventricular fibrillation, OSA, hypertension, CHF, celiac's disease, CKD, presenting with left-sided chest pain as well as epigastric pain and right flank pain.  States that chest pain has been ongoing for several days, also some epigastric aching that she thought was due to indigestion.  States chest pain feels like pressure, nonradiating.  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  No nausea, vomiting, shortness of breath, cough, urinary symptoms.  While she was waiting in the waiting room, did note some right back and right flank pain.  States she had prior hives and some nausea with contrast but no shortness of breath or tongue swelling.  Does have a cardiologist appointment at the end of February.   On independent chart review she had a nuclear stress test in 2023 that showed no ischemia.  EF is more than 65% the LAD and left circumflex stent noted.  History obtained from EMS, they gave her 324 of aspirin as well as 4 mg of Zofran, for them her heart rate was 82, satting 95% on room air, blood pressure is 119/64.  Physical Exam   Triage Vital Signs: ED Triage Vitals  Encounter Vitals Group     BP 02/19/23 1418 (!) 99/59     Systolic BP Percentile --      Diastolic BP Percentile --      Pulse Rate 02/19/23 1418 73     Resp 02/19/23 1418 18     Temp 02/19/23 1418 98.6 F (37 C)     Temp Source 02/19/23 1418 Oral     SpO2 02/19/23 1418 90 %     Weight 02/19/23 1417 225 lb (102.1 kg)     Height 02/19/23 1417 5\' 5"  (1.651 m)     Head Circumference --      Peak Flow --      Pain Score 02/19/23 1416 6     Pain Loc --      Pain Education --       Exclude from Growth Chart --     Most recent vital signs: Vitals:   02/19/23 2145 02/19/23 2300  BP: 118/70   Pulse: 75 88  Resp: 19 14  Temp:    SpO2:  92%     General: Awake, no distress.  CV:  Good peripheral perfusion.  Resp:  Normal effort.  Clear Abd:  No distention.  Soft, mildly tender to the epigastric region without guarding or rebound. Other:  No CVA tenderness but she does have some tenderness to the right lower back without midline spinal tenderness.  There is no overlying rash to her flank or to her back.   ED Results / Procedures / Treatments   Labs (all labs ordered are listed, but only abnormal results are displayed) Labs Reviewed  BASIC METABOLIC PANEL - Abnormal; Notable for the following components:      Result Value   Glucose, Bld 134 (*)    Creatinine, Ser 1.06 (*)    Calcium 8.6 (*)    GFR, Estimated 59 (*)    All other components within normal limits  HEPATIC FUNCTION PANEL -  Abnormal; Notable for the following components:   Alkaline Phosphatase 161 (*)    All other components within normal limits  URINALYSIS, W/ REFLEX TO CULTURE (INFECTION SUSPECTED) - Abnormal; Notable for the following components:   Color, Urine STRAW (*)    APPearance CLEAR (*)    Hgb urine dipstick SMALL (*)    All other components within normal limits  RESP PANEL BY RT-PCR (RSV, FLU A&B, COVID)  RVPGX2  CBC  LIPASE, BLOOD  BRAIN NATRIURETIC PEPTIDE  TROPONIN I (HIGH SENSITIVITY)  TROPONIN I (HIGH SENSITIVITY)     EKG  Normal sinus rhythm, reassigning, normal QS, normal QTc, T wave flattening in aVL, 3, V3, no ischemic ST elevation, not significant change compared to prior   RADIOLOGY Chest x-ray on my interpretation without focal consolidation   PROCEDURES:  Critical Care performed: Yes, see critical care procedure note(s)  .Critical Care  Performed by: Claybon Jabs, MD Authorized by: Claybon Jabs, MD   Critical care provider statement:    Critical  care time (minutes):  40   Critical care was necessary to treat or prevent imminent or life-threatening deterioration of the following conditions:  Respiratory failure   Critical care was time spent personally by me on the following activities:  Development of treatment plan with patient or surrogate, discussions with consultants, evaluation of patient's response to treatment, examination of patient, ordering and review of laboratory studies, ordering and review of radiographic studies, ordering and performing treatments and interventions, pulse oximetry, re-evaluation of patient's condition and review of old charts    MEDICATIONS ORDERED IN ED: Medications  ondansetron (ZOFRAN) injection 4 mg (has no administration in time range)  diphenhydrAMINE (BENADRYL) injection 50 mg (50 mg Intravenous Given 02/19/23 2201)  lactated ringers bolus 500 mL (500 mLs Intravenous New Bag/Given 02/19/23 1854)  methylPREDNISolone sodium succinate (SOLU-MEDROL) 40 mg/mL injection 40 mg (40 mg Intravenous Given 02/19/23 1855)  alum & mag hydroxide-simeth (MAALOX/MYLANTA) 200-200-20 MG/5ML suspension 30 mL (30 mLs Oral Given 02/19/23 1933)    And  lidocaine (XYLOCAINE) 2 % viscous mouth solution 15 mL (15 mLs Oral Given 02/19/23 1933)  iohexol (OMNIPAQUE) 350 MG/ML injection 100 mL (100 mLs Intravenous Contrast Given 02/19/23 2243)     IMPRESSION / MDM / ASSESSMENT AND PLAN / ED COURSE  I reviewed the triage vital signs and the nursing notes.                              Differential diagnosis includes, but is not limited to, ACS, musculoskeletal pain, no overlying rash to suggest zoster, also considered dehydration, electrolyte derangements, GERD, peptic ulcer disease, gastritis, exacerbation of his celiac or IBS, no shortness of breath or unilateral calf swelling or tenderness or hypoxia to suggest PE.  Did consider the dissection given that she has chest pain, flank pain and back pain, will get CT angio chest  abdomen pelvis dissection protocol.  Labs as well as EKG and chest x-ray were obtained at triage.  Will also get a UA to make sure there is no UTI.  The CT will also evaluate for kidney stones.  Shared decision making done with patient and she is agreeable plan.  Also give her a little bit of fluids and some Maalox.  Patient has contrast allergies, states that only had hives as well as some nausea during her last reaction.  Will pretreat per protocol with Solu-Medrol as well as Benadryl.  Patient's presentation  is most consistent with acute presentation with potential threat to life or bodily function.  Given the new hypoxia, no wheezing on exam to suggest asthma exacerbation, will place her on nasal cannula.  Respiratory viral panel and BNP are pending.  Will plan to have her admitted for further management.  Consult to hospitalist was agreeable with plan for admission and will evaluate the patient.  She is admitted.  Clinical Course as of 02/19/23 2337  Tue Feb 19, 2023  1823 DG Chest 2 View IMPRESSION: No active cardiopulmonary disease.   [TT]  2148 Independent review of labs, no leukocytosis, electrolytes not severely deranged, creatinine is 1.06, this is slightly elevated compared to prior.  AST and ALT are not elevated, bili is not elevated.  UA not consistent with UTI, lipase is not elevated.  Troponin x 2 is negative. [TT]  2309 CT Angio Chest/Abd/Pel for Dissection W and/or W/WO IMPRESSION: Vascular:  1. No evidence of thoracoabdominal aortic aneurysm or dissection. 2. No evidence of pulmonary embolus. 3. Coronary artery atherosclerosis.  Nonvascular:  1. Small hiatal hernia. 2. Scattered sigmoid diverticulosis without evidence of diverticulitis. 3. Stable areas of bandlike scarring within the mid and lower lung zones. No acute airspace disease.   [TT]  2309 On reassessment patient still having some chest pain.  Noted that her oxygen sat was 88%, went up to 91%.  Patient  states that she is not typically on oxygen.  Has chronic cough.  But no history of COPD.  No new cough, or fevers. [TT]    Clinical Course User Index [TT] Jodie Echevaria Franchot Erichsen, MD     FINAL CLINICAL IMPRESSION(S) / ED DIAGNOSES   Final diagnoses:  Chest pain, unspecified type  Right-sided low back pain without sciatica, unspecified chronicity  Epigastric pain  Hypoxia     Rx / DC Orders   ED Discharge Orders     None        Note:  This document was prepared using Dragon voice recognition software and may include unintentional dictation errors.    Claybon Jabs, MD 02/19/23 [phone]

## 2023-02-19 NOTE — ED Notes (Signed)
Pt placed on 2 liters oxygen [place]

## 2023-02-20 ENCOUNTER — Encounter: Payer: Self-pay | Admitting: Family Medicine

## 2023-02-20 ENCOUNTER — Observation Stay: Payer: 59

## 2023-02-20 ENCOUNTER — Observation Stay (HOSPITAL_BASED_OUTPATIENT_CLINIC_OR_DEPARTMENT_OTHER)
Admit: 2023-02-20 | Discharge: 2023-02-20 | Disposition: A | Discharge status: Home / Self Care | Payer: 59 | Attending: Medical | Admitting: Medical

## 2023-02-20 ENCOUNTER — Encounter: Payer: Self-pay | Admitting: Oncology

## 2023-02-20 DIAGNOSIS — I10 Essential (primary) hypertension: Secondary | ICD-10-CM | POA: Diagnosis not present

## 2023-02-20 DIAGNOSIS — E785 Hyperlipidemia, unspecified: Secondary | ICD-10-CM | POA: Diagnosis not present

## 2023-02-20 DIAGNOSIS — J45901 Unspecified asthma with (acute) exacerbation: Secondary | ICD-10-CM | POA: Diagnosis not present

## 2023-02-20 DIAGNOSIS — K219 Gastro-esophageal reflux disease without esophagitis: Secondary | ICD-10-CM | POA: Insufficient documentation

## 2023-02-20 DIAGNOSIS — I2510 Atherosclerotic heart disease of native coronary artery without angina pectoris: Secondary | ICD-10-CM | POA: Diagnosis not present

## 2023-02-20 DIAGNOSIS — F419 Anxiety disorder, unspecified: Secondary | ICD-10-CM

## 2023-02-20 DIAGNOSIS — R1013 Epigastric pain: Secondary | ICD-10-CM | POA: Diagnosis not present

## 2023-02-20 DIAGNOSIS — R079 Chest pain, unspecified: Secondary | ICD-10-CM | POA: Diagnosis not present

## 2023-02-20 DIAGNOSIS — R1011 Right upper quadrant pain: Secondary | ICD-10-CM

## 2023-02-20 DIAGNOSIS — R0902 Hypoxemia: Secondary | ICD-10-CM

## 2023-02-20 LAB — ECHOCARDIOGRAM COMPLETE
AR max vel: 2.82 cm2
AV Area VTI: 2.78 cm2
AV Area mean vel: 2.72 cm2
AV Mean grad: 3.5 mm[Hg]
AV Peak grad: 6.4 mm[Hg]
Ao pk vel: 1.27 m/s
Area-P 1/2: 3.91 cm2
Height: 65 in
S' Lateral: 3.2 cm
Weight: 3600 [oz_av]

## 2023-02-20 LAB — CBC
HCT: 40.6 % (ref 36.0–46.0)
Hemoglobin: 13.1 g/dL (ref 12.0–15.0)
MCH: 28.2 pg (ref 26.0–34.0)
MCHC: 32.3 g/dL (ref 30.0–36.0)
MCV: 87.3 fL (ref 80.0–100.0)
Platelets: 255 10*3/uL (ref 150–400)
RBC: 4.65 MIL/uL (ref 3.87–5.11)
RDW: 13.8 % (ref 11.5–15.5)
WBC: 7.8 10*3/uL (ref 4.0–10.5)
nRBC: 0 % (ref 0.0–0.2)

## 2023-02-20 LAB — LIPID PANEL
Cholesterol: 141 mg/dL (ref 0–200)
HDL: 58 mg/dL (ref >40)
LDL Cholesterol: 71 mg/dL (ref 0–99)
Total CHOL/HDL Ratio: 2.4 {ratio}
Triglycerides: 60 mg/dL (ref <150)
VLDL: 12 mg/dL (ref 0–40)

## 2023-02-20 LAB — BASIC METABOLIC PANEL
Anion gap: 7 (ref 5–15)
BUN: 15 mg/dL (ref 8–23)
CO2: 24 mmol/L (ref 22–32)
Calcium: 8.9 mg/dL (ref 8.9–10.3)
Chloride: 106 mmol/L (ref 98–111)
Creatinine, Ser: 0.88 mg/dL (ref 0.44–1.00)
GFR, Estimated: >60 mL/min (ref >60)
Glucose, Bld: 154 mg/dL — ABNORMAL HIGH (ref 70–99)
Potassium: 4.7 mmol/L (ref 3.5–5.1)
Sodium: 137 mmol/L (ref 135–145)

## 2023-02-20 LAB — RESP PANEL BY RT-PCR (RSV, FLU A&B, COVID)  RVPGX2
Influenza A by PCR: NEGATIVE
Influenza B by PCR: NEGATIVE
Resp Syncytial Virus by PCR: NEGATIVE
SARS Coronavirus 2 by RT PCR: NEGATIVE

## 2023-02-20 LAB — HIV ANTIBODY (ROUTINE TESTING W REFLEX): HIV Screen 4th Generation wRfx: NONREACTIVE

## 2023-02-20 LAB — TROPONIN I (HIGH SENSITIVITY): Troponin I (High Sensitivity): 2 ng/L (ref <18)

## 2023-02-20 MED ORDER — MELATONIN 5 MG PO TABS
5.0000 mg | ORAL_TABLET | Freq: Every day | ORAL | Status: DC
  Administered 2023-02-20 – 2023-02-21 (×3): 5 mg via ORAL
  Filled 2023-02-20 (×3): qty 1

## 2023-02-20 MED ORDER — ENOXAPARIN SODIUM 60 MG/0.6ML IJ SOSY
0.5000 mg/kg | PREFILLED_SYRINGE | INTRAMUSCULAR | Status: DC
  Administered 2023-02-20 – 2023-02-21 (×2): 50 mg via SUBCUTANEOUS
  Filled 2023-02-20 (×3): qty 0.6

## 2023-02-20 MED ORDER — MONTELUKAST SODIUM 10 MG PO TABS
10.0000 mg | ORAL_TABLET | Freq: Every day | ORAL | Status: DC
  Administered 2023-02-20 – 2023-02-22 (×3): 10 mg via ORAL
  Filled 2023-02-20 (×3): qty 1

## 2023-02-20 MED ORDER — LORATADINE 10 MG PO TABS
10.0000 mg | ORAL_TABLET | Freq: Every day | ORAL | Status: DC
  Administered 2023-02-20 – 2023-02-22 (×2): 10 mg via ORAL
  Filled 2023-02-20 (×2): qty 1

## 2023-02-20 MED ORDER — BENZONATATE 100 MG PO CAPS
100.0000 mg | ORAL_CAPSULE | Freq: Three times a day (TID) | ORAL | Status: DC | PRN
  Administered 2023-02-20 – 2023-02-22 (×3): 100 mg via ORAL
  Filled 2023-02-20 (×3): qty 1

## 2023-02-20 MED ORDER — LOSARTAN POTASSIUM 25 MG PO TABS
25.0000 mg | ORAL_TABLET | Freq: Every day | ORAL | Status: DC
  Administered 2023-02-20 – 2023-02-22 (×3): 25 mg via ORAL
  Filled 2023-02-20 (×3): qty 1

## 2023-02-20 MED ORDER — GUAIFENESIN ER 600 MG PO TB12
600.0000 mg | ORAL_TABLET | Freq: Two times a day (BID) | ORAL | Status: DC
  Administered 2023-02-20 – 2023-02-22 (×4): 600 mg via ORAL
  Filled 2023-02-20 (×5): qty 1

## 2023-02-20 MED ORDER — NITROGLYCERIN 0.4 MG SL SUBL: 0.4000 mg | SUBLINGUAL_TABLET | SUBLINGUAL | Status: DC | PRN

## 2023-02-20 MED ORDER — DIPHENHYDRAMINE HCL 25 MG PO CAPS
50.0000 mg | ORAL_CAPSULE | Freq: Four times a day (QID) | ORAL | Status: DC | PRN
  Administered 2023-02-21: 50 mg via ORAL
  Filled 2023-02-20: qty 2

## 2023-02-20 MED ORDER — GABAPENTIN 100 MG PO CAPS
100.0000 mg | ORAL_CAPSULE | Freq: Three times a day (TID) | ORAL | Status: DC
  Administered 2023-02-20 – 2023-02-22 (×3): 100 mg via ORAL
  Filled 2023-02-20 (×6): qty 1

## 2023-02-20 MED ORDER — PANTOPRAZOLE SODIUM 40 MG PO TBEC
40.0000 mg | DELAYED_RELEASE_TABLET | Freq: Every day | ORAL | Status: DC
Start: 2023-02-20 — End: 2023-02-22
  Administered 2023-02-20 – 2023-02-22 (×2): 40 mg via ORAL
  Filled 2023-02-20 (×2): qty 1

## 2023-02-20 MED ORDER — MORPHINE SULFATE (PF) 2 MG/ML IV SOLN
2.0000 mg | INTRAVENOUS | Status: DC | PRN
  Administered 2023-02-20 (×2): 2 mg via INTRAVENOUS
  Filled 2023-02-20 (×2): qty 1

## 2023-02-20 MED ORDER — ALUM & MAG HYDROXIDE-SIMETH 200-200-20 MG/5ML PO SUSP
30.0000 mL | Freq: Once | ORAL | Status: DC
  Filled 2023-02-20: qty 30

## 2023-02-20 MED ORDER — NEBIVOLOL HCL 5 MG PO TABS
5.0000 mg | ORAL_TABLET | Freq: Every day | ORAL | Status: DC
  Administered 2023-02-21: 5 mg via ORAL
  Filled 2023-02-20 (×3): qty 1

## 2023-02-20 MED ORDER — AZELASTINE HCL 0.1 % NA SOLN: 2.0000 | Freq: Two times a day (BID) | NASAL | Status: DC

## 2023-02-20 MED ORDER — ROSUVASTATIN CALCIUM 10 MG PO TABS
10.0000 mg | ORAL_TABLET | Freq: Every day | ORAL | Status: DC
  Administered 2023-02-20 – 2023-02-21 (×2): 10 mg via ORAL
  Filled 2023-02-20 (×2): qty 1

## 2023-02-20 MED ORDER — SODIUM CHLORIDE 0.9 % IV SOLN: INTRAVENOUS | Status: AC

## 2023-02-20 MED ORDER — LORAZEPAM 1 MG PO TABS
1.0000 mg | ORAL_TABLET | Freq: Three times a day (TID) | ORAL | Status: DC | PRN
  Administered 2023-02-20 – 2023-02-22 (×4): 1 mg via ORAL
  Filled 2023-02-20 (×4): qty 1

## 2023-02-20 MED ORDER — SODIUM CHLORIDE 0.9 % WEIGHT BASED INFUSION
3.0000 mL/kg/h | INTRAVENOUS | Status: AC
  Administered 2023-02-21: 3 mL/kg/h via INTRAVENOUS

## 2023-02-20 MED ORDER — TRIAMCINOLONE ACETONIDE 55 MCG/ACT NA AERO
1.0000 | INHALATION_SPRAY | Freq: Two times a day (BID) | NASAL | Status: DC
Start: 2023-02-20 — End: 2023-02-20

## 2023-02-20 MED ORDER — PERFLUTREN LIPID MICROSPHERE
1.0000 mL | INTRAVENOUS | Status: AC | PRN
  Administered 2023-02-20: 4 mL via INTRAVENOUS

## 2023-02-20 MED ORDER — IPRATROPIUM-ALBUTEROL 0.5-2.5 (3) MG/3ML IN SOLN: 3.0000 mL | Freq: Four times a day (QID) | RESPIRATORY_TRACT | Status: DC

## 2023-02-20 MED ORDER — ASPIRIN 81 MG PO TBEC
81.0000 mg | DELAYED_RELEASE_TABLET | Freq: Every day | ORAL | Status: DC
  Administered 2023-02-20 – 2023-02-22 (×3): 81 mg via ORAL
  Filled 2023-02-20 (×3): qty 1

## 2023-02-20 MED ORDER — POTASSIUM CHLORIDE 20 MEQ PO PACK
40.0000 meq | PACK | Freq: Once | ORAL | Status: AC
  Administered 2023-02-20: 40 meq via ORAL

## 2023-02-20 MED ORDER — ASPIRIN 81 MG PO CHEW
81.0000 mg | CHEWABLE_TABLET | ORAL | Status: AC
  Administered 2023-02-21: 81 mg via ORAL
  Filled 2023-02-20: qty 1

## 2023-02-20 MED ORDER — ALBUTEROL SULFATE (2.5 MG/3ML) 0.083% IN NEBU
2.5000 mg | INHALATION_SOLUTION | RESPIRATORY_TRACT | Status: DC | PRN
  Administered 2023-02-22: 2.5 mg via RESPIRATORY_TRACT
  Filled 2023-02-20: qty 3

## 2023-02-20 MED ORDER — DICYCLOMINE HCL 10 MG PO CAPS
10.0000 mg | ORAL_CAPSULE | ORAL | Status: DC | PRN
Start: 2023-02-19 — End: 2023-02-22

## 2023-02-20 MED ORDER — TRAZODONE HCL 50 MG PO TABS: 25.0000 mg | ORAL_TABLET | Freq: Every evening | ORAL | Status: DC | PRN

## 2023-02-20 MED ORDER — ACETAMINOPHEN 650 MG RE SUPP: 650.0000 mg | Freq: Four times a day (QID) | RECTAL | Status: DC | PRN

## 2023-02-20 MED ORDER — ACETAMINOPHEN 325 MG PO TABS
650.0000 mg | ORAL_TABLET | Freq: Four times a day (QID) | ORAL | Status: DC | PRN
  Administered 2023-02-20 – 2023-02-21 (×3): 650 mg via ORAL
  Filled 2023-02-20 (×3): qty 2

## 2023-02-20 MED ORDER — SODIUM CHLORIDE 0.9 % WEIGHT BASED INFUSION
1.0000 mL/kg/h | INTRAVENOUS | Status: DC
  Administered 2023-02-21: 1 mL/kg/h via INTRAVENOUS

## 2023-02-20 MED ORDER — IPRATROPIUM-ALBUTEROL 0.5-2.5 (3) MG/3ML IN SOLN
3.0000 mL | Freq: Four times a day (QID) | RESPIRATORY_TRACT | Status: DC
  Administered 2023-02-20 – 2023-02-21 (×2): 3 mL via RESPIRATORY_TRACT
  Filled 2023-02-20 (×2): qty 3

## 2023-02-20 MED ORDER — FAMOTIDINE 20 MG PO TABS
40.0000 mg | ORAL_TABLET | Freq: Every day | ORAL | Status: DC
  Administered 2023-02-20 – 2023-02-21 (×3): 40 mg via ORAL
  Filled 2023-02-20 (×3): qty 2

## 2023-02-20 MED ORDER — PREDNISONE 50 MG PO TABS
50.0000 mg | ORAL_TABLET | Freq: Three times a day (TID) | ORAL | Status: AC
  Administered 2023-02-20 – 2023-02-21 (×2): 50 mg via ORAL
  Filled 2023-02-20 (×2): qty 1

## 2023-02-20 MED ORDER — HYDROCOD POLI-CHLORPHE POLI ER 10-8 MG/5ML PO SUER
5.0000 mL | Freq: Every evening | ORAL | Status: DC | PRN
  Administered 2023-02-20: 5 mL via ORAL
  Filled 2023-02-20: qty 5

## 2023-02-20 NOTE — Progress Notes (Signed)
Progress Note   Patient: Brenda Craig ZOX:[id number] DOB: 02/21/1960 DOA: 02/19/2023     0 DOS: the patient was seen and examined on 02/20/2023   Brief hospital course: Taken from H&P.  Brenda Craig is a 63 y.o. Caucasian female with medical history significant for asthma, celiac disease, CHF, OSA, CKD, coronary artery disease S/PE STEMI and cardiac arrest in 2014, who presented to the emergency room with acute onset of left-sided chest pain felt as pressure and tightness as well as upper abdominal pain extending like a band involving her epigastric left upper quadrant and more in the right upper quadrant.  She denied any nausea or vomiting.   Vitals stable with mild hypoxia at 88% on room air requiring supplemental oxygen.  Labs with elevated alkaline phosphatase at 161, otherwise unremarkable CMP, BNP normal at 41.4, troponin negative twice.  EKG with normal sinus rhythm. Chest x-ray with no acute cardiopulmonary disease.  2/12: CTA chest, abdomen and pelvis overnight negative for any PE, thoracoabdominal aortic aneurysm or dissection.  Scattered sigmoid diverticulosis without evidence of diverticulitis.  Stable bandlike scarring within the mid and lower lung zones.  No acute airspace disease.  RUQ ultrasound unremarkable.  UA is not consistent with UTI, rest of the labs remained stable.  Based on her cardiac history cardiology would like to get cardiac catheterization which was initially scheduled for today, and then moved to tomorrow morning due to concern of her history of contrast allergy as she will need premedication. Trying some GI cocktail as her symptoms seems more epigastric pain.    Assessment and Plan: * Chest pain The patient has a history of STEMI and cardiac arrest status post PCI and LAD stent in March 2014. Patient has atypical chest versus epigastric pain.  CTA chest, abdomen and pelvis was negative for PE, any aneurysm or dissection.  No other significant  abnormality noted.  Cardiology would like to do cardiac catheterization due to her extensive cardiac history, likely be done tomorrow for concern of contrast allergy requiring premedications. -Continue home aspirin, losartan and Crestor   Asthma, chronic, unspecified asthma severity, with acute exacerbation No concern of exacerbation at this time. On room air. -Continue with as needed bronchodilator  Acute respiratory failure with hypoxia (HCC)  Chest CTA showed no specific reasons for her hypoxia and ruled out PE. Patient was weaned back to room air. -Continue with supportive care and supplemental oxygen as needed  Right upper quadrant abdominal pain RUQ ultrasound unremarkable. Pain seems improved  Dyslipidemia - We will continue statin therapy.  GERD without esophagitis - We will continue PPI therapy and H2 blocker therapy.  Essential hypertension - We will continue antihypertensive therapy.  Anxiety - This could be significantly contributing to her symptoms. - We will continue antianxiety medications.   Subjective: Patient was complaining of epigastric crampy pain, radiating to back.  Physical Exam: Vitals:   02/20/23 1130 02/20/23 1145 02/20/23 1200 02/20/23 1215  BP: (!) 146/125  115/65   Pulse: 97 72 82 86  Resp: 15 17 20  (!) 24  Temp:      TempSrc:      SpO2: 92% 95% 95% (!) 89%  Weight:      Height:       General.  Obese lady, in no acute distress. Pulmonary.  Lungs clear bilaterally, normal respiratory effort. CV.  Regular rate and rhythm, no JVD, rub or murmur. Abdomen.  Soft, nontender, nondistended, BS positive. CNS.  Alert and oriented .  No focal  neurologic deficit. Extremities.  No edema, no cyanosis, pulses intact and symmetrical. Psychiatry.  Judgment and insight appears normal.   Data Reviewed: Prior data reviewed  Family Communication: Discussed with patient  Disposition: Status is: Observation The patient will require care spanning >  2 midnights and should be moved to inpatient because: Need cardiac catheterization before discharge  Planned Discharge Destination: Home  DVT prophylaxis.  Lovenox Time spent: 45 minutes  This record has been created using Conservation officer, historic buildings. Errors have been sought and corrected,but may not always be located. Such creation errors do not reflect on the standard of care.   Author: Arnetha Courser, MD 02/20/2023 1:51 PM  For on call review [email or web address].

## 2023-02-20 NOTE — Assessment & Plan Note (Addendum)
The patient has a history of STEMI and cardiac arrest status post PCI and LAD stent in March 2014. Patient has atypical chest versus epigastric pain.  CTA chest, abdomen and pelvis was negative for PE, any aneurysm or dissection.  No other significant abnormality noted.  Cardiology would like to do cardiac catheterization due to her extensive cardiac history, likely be done tomorrow for concern of contrast allergy requiring premedications. -Continue home aspirin, losartan and Crestor

## 2023-02-20 NOTE — Assessment & Plan Note (Addendum)
Chest CTA showed no specific reasons for her hypoxia and ruled out PE. Patient was weaned back to room air. -Continue with supportive care and supplemental oxygen as needed

## 2023-02-20 NOTE — Assessment & Plan Note (Signed)
-  We will continue statin therapy.

## 2023-02-20 NOTE — Assessment & Plan Note (Signed)
-   This could be significantly contributing to her symptoms. - We will continue antianxiety medications.

## 2023-02-20 NOTE — ED Notes (Signed)
Pt to room 30   cpod.   Report off to cpod nurse.

## 2023-02-20 NOTE — Assessment & Plan Note (Addendum)
RUQ ultrasound unremarkable. Pain seems improved

## 2023-02-20 NOTE — Assessment & Plan Note (Addendum)
-   We will continue PPI therapy and H2 blocker therapy.

## 2023-02-20 NOTE — Assessment & Plan Note (Addendum)
No concern of exacerbation at this time. On room air. -Continue with as needed bronchodilator

## 2023-02-20 NOTE — H&P (View-Only) (Signed)
Cardiology Consultation   Patient ID: [name] MRN: [id number]; DOB: [birth year]  Admit date: 02/19/2023 Date of Consult: 02/20/2023  PCP:  Jaclyn Shaggy, MD   [facility] Providers Cardiologist:  Julien Nordmann, MD   {   Patient Profile:   ZAELA GRALEY is a 63 y.o. female with a hx of CAD with STEMI complicated by Vfib arrest in 05/2011 s/p PCI to the Lcx with unstable angina in 03/2012 s/p PCI to the LAD, recurrent chest pain, celiac disease, iron ef anemia, HTN, HLD, nephrolithiasis, and OSA who is being seen 02/20/2023 for the evaluation of chest pain at the request of Dr. Nelson Chimes.  History of Present Illness:   Ms. Debes is followed by Dr. Mariah Milling. LHC in 10/2014 showed patent stents with 50% mLAD stenosis with negative FFR. Stress test 11/2020 was without evidence of inducible myocardial ischemia with normal LVSF. She established care with Dr. Mariah Milling in 01/2021 reporting atypical chest pain.   The patient was last seen 12/2021 for pre-op evaluation and was stable for surgery.   The patient presented to the ER 02/19/23 for chest pain. 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 She felt like she was going to pass out. She had severe chest pressure. Says she has had chest pressure for 2 days ago that was dull. Also experienced sharp pain going across her stomach. The nurse called EMS and was brought to the Er. She reported associated nausea. Has SL NTG but has not needed in about 7 years.   In the ER BP 99/59, pulse 73bpm, RR 18, afebrile, 90% O2. Labs showed BG 134, Scr 1.06, Calcium 8.6, K 3.5, sodium 135, WBC 7.6, Hgb 13.4, GFR 59, alk phos 161. HS trop 4>4>2. EKG showed NSR 78bpm, non significant ST changes. Cxr nonacute. CT chest/ab/pelvis showed no aortic process, no PE, small hiatal hernia.  Past Medical History:  Diagnosis Date   Abnormal Pap smear of cervix 1988   Acid reflux 01/17/2013   Allergic rhinitis    Anemia     Anginal pain (HCC)    Anxiety    Arthritis    Asthma    Bronchiectasis (HCC)    Celiac disease    Cervical dysplasia 1988   CHF (congestive heart failure) (HCC)    Chronic kidney disease    Complication of anesthesia    bp drops after anesthesia and has to be kept overnight   Coronary artery disease    Headache    migraine with visual aura   Heart trouble    History of cardiac arrest    History of hiatal hernia    History of kidney stones    History of mammogram 03/25/2013; 12/07/14   BIRADS 1; NEG   History of Papanicolaou smear of cervix 03/20/12; 12/28/15   -/-; -/-   Hypercholesteremia    Hypertension    IBS (irritable bowel syndrome)    Menopausal symptoms    MI (myocardial infarction) (HCC)    Mitral valve prolapse    Pneumonia    PONV (postoperative nausea and vomiting)    nausea only   Sleep apnea    Vulvovaginitis    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    Past Surgical History:  Procedure Laterality Date   ABLATION  2013   [facility]   BACK SURGERY     BACK SURGERY  2016; 2017   L4, L5   CARDIAC SURGERY  03/19/12;  10/2014   HEART CATH AND STENT   CESAREAN SECTION     1986; 1989   COLD KNIFE CONE BIOPSY  1988   COLONOSCOPY  08/2010   16 POLYPS (ALL BENIGN) DX C CELIAC DISEASE   COLONOSCOPY N/A 02/17/2021   Procedure: COLONOSCOPY;  Surgeon: Regis Bill, MD;  Location: [facility] ENDOSCOPY;  Service: Endoscopy;  Laterality: N/A;   COMBINED HYSTEROSCOPY DIAGNOSTIC / D&C  2013   [facility]   CYSTOSCOPY W/ RETROGRADES Left 01/20/2019   Procedure: CYSTOSCOPY WITH RETROGRADE PYELOGRAM;  Surgeon: Riki Altes, MD;  Location: [facility] ORS;  Service: Urology;  Laterality: Left;   CYSTOSCOPY W/ URETERAL STENT PLACEMENT Left 12/13/2018   Procedure: CYSTOSCOPY WITH RETROGRADE PYELOGRAM/URETERAL STENT PLACEMENT;  Surgeon: Bjorn Pippin, MD;  Location: [facility] ORS;  Service: Urology;  Laterality: Left;   CYSTOSCOPY/URETEROSCOPY/HOLMIUM LASER/STENT PLACEMENT Left  01/20/2019   Procedure: CYSTOSCOPY/URETEROSCOPY/HOLMIUM LASER/STENT EXCHANGE;  Surgeon: Riki Altes, MD;  Location: [facility] ORS;  Service: Urology;  Laterality: Left;   DILATION AND CURETTAGE OF UTERUS  2001   ESOPHAGOGASTRODUODENOSCOPY  08/2010   DX C CELIAC DISEASE   ESOPHAGOGASTRODUODENOSCOPY N/A 02/17/2021   Procedure: ESOPHAGOGASTRODUODENOSCOPY (EGD);  Surgeon: Regis Bill, MD;  Location: [facility] ENDOSCOPY;  Service: Endoscopy;  Laterality: N/A;   HEART STENTS  05/27/2011   HIP SURGERY  2016   RIGHT IT BAND AND BURSA REMOVAL   OOPHORECTOMY Right 1982   TOTAL KNEE ARTHROPLASTY Left 01/19/2020   Procedure: TOTAL KNEE ARTHROPLASTY;  Surgeon: Sheral Apley, MD;  Location: [facility] ORS;  Service: Orthopedics;  Laterality: Left;   TOTAL KNEE REVISION Left 03/07/2022   Procedure: TOTAL KNEE REVISION;  Surgeon: Joen Laura, MD;  Location: [facility] ORS;  Service: Orthopedics;  Laterality: Left;     Home Medications:  Prior to Admission medications   Medication Sig Start Date End Date Taking? Authorizing Provider  acetaminophen (TYLENOL) 500 MG tablet Take 1,000 mg by mouth as needed for moderate pain or headache.   Yes [provider]  albuterol (PROVENTIL) (2.5 MG/3ML) 0.083% nebulizer solution Take 3 mLs (2.5 mg total) by nebulization every 4 (four) hours as needed for wheezing or shortness of breath (coughing fits). 12/11/21  Yes Ellamae Sia, DO  albuterol (VENTOLIN HFA) 108 (90 Base) MCG/ACT inhaler Inhale 2 puffs into the lungs as needed for wheezing or shortness of breath.   Yes [provider]  aspirin EC 81 MG tablet Take 81 mg by mouth daily. Swallow whole.   Yes [provider]  cetirizine (ZYRTEC) 10 MG tablet TAKE 1 TABLET BY MOUTH EVERYDAY AT BEDTIME 02/15/23  Yes Kasa, Wallis Bamberg, MD  chlorpheniramine-HYDROcodone (TUSSIONEX) 10-8 MG/5ML Take 5 mLs by mouth at bedtime as needed for cough. 10/03/22  Yes [provider]  clobetasol ointment (TEMOVATE)  0.05 % Apply to affected area every night for 4 weeks, then every other day for 4 weeks and then 1-2 times a week for maintenance 10/02/22  Yes Copland, Alicia B, PA-C  dicyclomine (BENTYL) 10 MG capsule Take 10 mg by mouth as needed for spasms.   Yes [provider]  diphenhydrAMINE (BENADRYL ALLERGY) 25 MG tablet Take 25 mg by mouth as needed for itching.   Yes [provider]  famotidine (PEPCID) 40 MG tablet Take 40 mg by mouth at bedtime. 12/24/18  Yes [provider]  gabapentin (NEURONTIN) 100 MG capsule Take 1 capsule (100 mg total) by mouth 3 (three) times daily. 10/19/22  Yes Glenford Bayley, NP  LORazepam (  ATIVAN) 1 MG tablet Take 1 mg by mouth 2 (two) times daily. 11/14/12  Yes [provider]  losartan (COZAAR) 25 MG tablet TAKE 1 TABLET (25 MG TOTAL) BY MOUTH DAILY. 11/02/22  Yes Gollan, Tollie Pizza, MD  Melatonin 5 MG CAPS Take 5 mg by mouth at bedtime.   Yes [provider]  montelukast (SINGULAIR) 10 MG tablet Take 10 mg by mouth daily. 06/30/21  Yes [provider]  nebivolol (BYSTOLIC) 5 MG tablet TAKE 1 TABLET (5 MG TOTAL) BY MOUTH DAILY WITH SUPPER. 12/26/22  Yes Gollan, Tollie Pizza, MD  nitroGLYCERIN (NITROSTAT) 0.4 MG SL tablet Place 1 tablet (0.4 mg total) under the tongue every 5 (five) minutes as needed for chest pain. 08/04/22  Yes Antonieta Iba, MD  nystatin cream (MYCOSTATIN) Apply 1 Application topically as needed (rash). 01/09/22  Yes [provider]  pantoprazole (PROTONIX) 20 MG tablet Take 40 mg by mouth daily. 05/25/21  Yes [provider]  rosuvastatin (CRESTOR) 10 MG tablet Take 10 mg by mouth at bedtime. 05/28/20  Yes [provider]  triamcinolone cream (KENALOG) 0.1 % Apply 1 Application topically as needed (break out). 01/09/22  Yes [provider]  amoxicillin (AMOXIL) 500 MG capsule Take 2,000 mg by mouth See admin instructions. Before dental appointment Patient not taking:  Reported on 10/19/2022    [provider]  azelastine (ASTELIN) 0.1 % nasal spray Place 2 sprays into both nostrils 2 (two) times daily. Use in each nostril as directed Patient not taking: Reported on 02/20/2023 08/22/22   Erin Fulling, MD  benzonatate (TESSALON) 200 MG capsule Take 400 mg by mouth every 8 (eight) hours as needed. Patient not taking: Reported on 02/20/2023 09/09/22   [provider]  Phenylephrine-DM-GG (TUSSIDEX PO) Take 5 mLs by mouth as needed (cough). Patient not taking: Reported on 02/20/2023    [provider]  triamcinolone (NASACORT) 55 MCG/ACT AERO nasal inhaler Place 1 spray into the nose 2 (two) times daily. Patient not taking: Reported on 02/20/2023 08/22/22 08/22/23  Erin Fulling, MD    Inpatient Medications: Scheduled Meds:  aspirin EC  81 mg Oral Daily   enoxaparin (LOVENOX) injection  0.5 mg/kg Subcutaneous Q24H   famotidine  40 mg Oral QHS   gabapentin  100 mg Oral TID   guaiFENesin  600 mg Oral BID   ipratropium-albuterol  3 mL Nebulization QID   loratadine  10 mg Oral Daily   losartan  25 mg Oral Daily   melatonin  5 mg Oral QHS   montelukast  10 mg Oral Daily   nebivolol  5 mg Oral Q supper   pantoprazole  40 mg Oral Daily   rosuvastatin  10 mg Oral QHS   Continuous Infusions:  sodium chloride 100 mL/hr at 02/20/23 0104   PRN Meds: acetaminophen **OR** acetaminophen, albuterol, chlorpheniramine-HYDROcodone, dicyclomine, LORazepam, morphine injection, nitroGLYCERIN, traZODone  Allergies:    Allergies  Allergen Reactions   Other Other (See Comments)    Gluten allergy Celiac    Codeine Nausea And Vomiting and Rash   Esomeprazole Magnesium Palpitations and Other (See Comments)    IRREGULAR HEARTBEAT   Iodinated Contrast Media Itching   Omeprazole Magnesium Palpitations and Other (See Comments)    IRREGULAR HEARTBEAT   Oxycodone-Acetaminophen Itching, Nausea And Vomiting and Rash    Can take with benadryl   Percocet  [Oxycodone-Acetaminophen] Itching, Nausea And Vomiting and Rash    Can take with benadryl    Tramadol Rash  Can take with benadryl     Social History:   Social History   Socioeconomic History   Marital status: Divorced    Spouse name: Not on file   Number of children: 2   Years of education: 16   Highest education level: Not on file  Occupational History   Occupation: TEACHER  Tobacco Use   Smoking status: Never    Passive exposure: Never   Smokeless tobacco: Never  Vaping Use   Vaping status: Never Used  Substance and Sexual Activity   Alcohol use: Yes    Alcohol/week: 1.0 standard drink of alcohol    Types: 1 Standard drinks or equivalent per week   Drug use: No   Sexual activity: Not Currently    Birth control/protection: Surgical  Other Topics Concern   Not on file  Social History Narrative   Not on file   Social Drivers of Health   Financial Resource Strain: Not on file  Food Insecurity: No Food Insecurity (03/07/2022)   Hunger Vital Sign    Worried About Running Out of Food in the Last Year: Never true    Ran Out of Food in the Last Year: Never true  Transportation Needs: No Transportation Needs (03/07/2022)   PRAPARE - Transportation    Lack of Transportation (Medical): No    Lack of Transportation (Non-Medical): No  Physical Activity: Not on file  Stress: Not on file  Social Connections: Not on file  Intimate Partner Violence: Not At Risk (03/07/2022)   Humiliation, Afraid, Rape, and Kick questionnaire    Fear of Current or Ex-Partner: No    Emotionally Abused: No    Physically Abused: No    Sexually Abused: No    Family History:    Family History  Problem Relation Age of Onset   CAD Father    Transient ischemic attack Father    Diabetes Father    Cerebrovascular Accident Father    Heart disease Father        M-MVP; F BETA;BLOCKERS   Hypothyroidism Father    Hyperthyroidism Mother    Cancer Maternal Uncle        KIDNEY   Uterine cancer  Maternal Aunt 69     ROS:  Please see the history of present illness.   All other ROS reviewed and negative.     Physical Exam/Data:   Vitals:   02/20/23 0109 02/20/23 0145 02/20/23 0635 02/20/23 0730  BP: 109/71  112/68 (!) 126/110  Pulse: 82 85 76 83  Resp: 19 (!) 22 17 15   Temp: 98 F (36.7 C)  (!) 97.5 F (36.4 C)   TempSrc: Oral  Oral   SpO2: 92% 90%  91%  Weight:      Height:       No intake or output data in the 24 hours ending 02/20/23 0738    02/19/2023    2:17 PM 10/19/2022   11:31 AM 09/27/2022    9:17 AM  Last 3 Weights  Weight (lbs) 225 lb 233 lb 9.6 oz 232 lb  Weight (kg) 102.059 kg 105.96 kg 105.235 kg     Body mass index is 37.44 kg/m.  General:  Well nourished, well developed, in no acute distress HEENT: normal Neck: no JVD Vascular: No carotid bruits; Distal pulses 2+ bilaterally Cardiac:  normal S1, S2; RRR; no murmur  Lungs:  clear to auscultation bilaterally, no wheezing, rhonchi or rales  Abd: soft, nontender, no hepatomegaly  Ext: no edema Musculoskeletal:  No  deformities, BUE and BLE strength normal and equal Skin: warm and dry  Neuro:  CNs 2-12 intact, no focal abnormalities noted Psych:  Normal affect   EKG:  The EKG was personally reviewed and demonstrates:  NSR 78bpm, nonspecific T wave changes Telemetry:  Telemetry was personally reviewed and demonstrates:  NSR 80s  Relevant CV Studies:   Lexiscan MPI 11/28/2020 [facility]): CONCLUSION:  1. Normal Lexiscan Myoview stress test without significant inducible myocardial ischemia  2. Normal left ventricular systolic function.  __________   Eugenie Birks MPI 05/26/2019 [facility]): Conclusion: In summary stress test was nondiagnostic negative for ischemia.   Post stress gated tomographic myocardial perfusion imaging:  Comparison of rest and stress images showed: There is normal homogeneous uptake of technetium labeled cardiac to the left ventricular myocardium at rest as well as stress.   No fixed or reversible perfusion defect to suggest ischemia or infarction.  Gated wall motion analysis showed hyperdynamic and normal global and segmental left ventricular contractility and wall motion.  Calculated ejection fraction was 85%.    __________   2D echo 05/22/2016: - Left ventricle: The cavity size was normal. Wall thickness was at    the upper limits of normal. Systolic function was vigorous. The    estimated ejection fraction was in the range of 65% to 70%.    Doppler parameters are consistent with abnormal left ventricular    relaxation (grade 1 diastolic dysfunction).  - Right ventricle: The cavity size was normal. Systolic function    was normal.  - Atrial septum: Agitated saline contrast study showed no    right-to-left atrial level shunt, in the baseline state. __________   Eugenie Birks MPI 12/26/2015 [facility]): Conclusion:  Normal poststress ejection fraction of  75 %.  Normal rest/stress perfusion scan with no evidence of ischemia or infarction. __________   2D echo 08/23/2015: - Left ventricle: The cavity size was normal. Systolic function was    normal. The estimated ejection fraction was in the range of 60%    to 65%. Wall motion was normal; there were no regional wall    motion abnormalities. Left ventricular diastolic function    parameters were normal.  - Left atrium: The atrium was normal in size.  - Right ventricle: Systolic function was normal.  - Pulmonary arteries: Systolic pressure was within the normal    range.   Impressions:   - Normal study. __________   LHC 10/18/2014 [facility]): Left main normal, LAD patent stent with 30% stenosis distal followed by 50% stenosis at the level of D2 (both stenoses were 10% increase from prior), distal LAD 20% stenosis, LCx normal, RCA dominant large vessel with mid and distal 30% stenosis   Final results :  1.  Clinical : Recurrent chest pains etiology undetermined.  2.  Anatomic : Normal LAD and marginal  circumflex stents.  Mild  progression of atherosclerosis about 10% at all focal previous stenosis.  Mid LAD has 50% stenosis.  Negative FFR for hemodynamic significance.    Other stenosis including 30% mid and distal right coronary artery and 30%  followed by 50% mid LAD stenosis.  Ejection fraction improved 55-60%.   Plan:  Continue aspirin and Brilinta.  Increase statin to better control of LDL.  Risk factor control including diet exercise for weight loss.  __________   Eugenie Birks MPI 10/09/2014: There was no ST segment deviation noted during stress. Blood pressure demonstrated a blunted response to exercise. No clear evidence of stress-induced myocardial ischemia Overall ejection fraction of 45%  with inferolateral wall hypokinesis This is considered a low riskHistory This is a low risk study. Nuclear stress EF: 45%. The left ventricular ejection fraction is mildly decreased (45-54%). __________   Stress echo 01/17/2013 ([facility]): INTERPRETATION    NORMAL STRESS TEST. NORMAL RESTING STUDY WITH NO WALL MOTION ABNORMALITIES   AT REST AND PEAK STRESS.   VALVULAR REGURGITATION: TRIVIAL PR, TRIVIAL TR   NO VALVULAR STENOSIS   NORMAL RESTING BP - APPROPRIATE RESPONSE  __________   [facility] 04/15/2012 [facility]): Left main normal, mid LAD 95% stenosis status post PCI/DES, LCx small to moderate size vessel and nondominant, RCA dominant with 20% ostial and proximal stenosis, LVEF 60% ___  Laboratory Data:  High Sensitivity Troponin:   Recent Labs  Lab 02/19/23 1425 02/19/23 1710  TROPONINIHS 4 4     Chemistry Recent Labs  Lab 02/19/23 1425  NA 135  K 3.5  CL 101  CO2 23  GLUCOSE 134*  BUN 20  CREATININE 1.06*  CALCIUM 8.6*  GFRNONAA 59*  ANIONGAP 11    Recent Labs  Lab 02/19/23 1710  PROT 6.9  ALBUMIN 3.8  AST 24  ALT 24  ALKPHOS 161*  BILITOT 0.6   Lipids No results for input(s): "CHOL", "TRIG", "HDL", "LABVLDL", "LDLCALC", "CHOLHDL" in the last 168 hours.   Hematology Recent Labs  Lab 02/19/23 1425 02/20/23 0708  WBC 7.6 7.8  RBC 4.75 4.65  HGB 13.4 13.1  HCT 42.9 40.6  MCV 90.3 87.3  MCH 28.2 28.2  MCHC 31.2 32.3  RDW 13.9 13.8  PLT 276 255   Thyroid No results for input(s): "TSH", "FREET4" in the last 168 hours.  BNP Recent Labs  Lab 02/19/23 1425  BNP 41.4    DDimer No results for input(s): "DDIMER" in the last 168 hours.   Radiology/Studies:  US Abdomen Limited RUQ (LIVER/GB) Result Date: 02/20/2023 CLINICAL DATA:  Abdominal pain. EXAM: ULTRASOUND ABDOMEN LIMITED RIGHT UPPER QUADRANT COMPARISON:  January 03, 2021 FINDINGS: Gallbladder: No gallstones or wall thickening visualized (2.9 mm). No sonographic Murphy sign noted by sonographer. Common bile duct: Diameter: 1.8 mm Liver: No focal lesion identified. Within normal limits in parenchymal echogenicity. Portal vein is patent on color Doppler imaging with normal direction of blood flow towards the liver. Other: None. IMPRESSION: Unremarkable right upper quadrant ultrasound. Electronically Signed   By: Aram Candela M.D.   On: 02/20/2023 01:17   CT Angio Chest/Abd/Pel for Dissection W and/or W/WO Result Date: 02/19/2023 CLINICAL DATA:  Chest pain, epigastric and flank pain, lower back pain EXAM: CT ANGIOGRAPHY CHEST, ABDOMEN AND PELVIS TECHNIQUE: Non-contrast CT of the chest was initially obtained. Multidetector CT imaging through the chest, abdomen and pelvis was performed using the standard protocol during bolus administration of intravenous contrast. Multiplanar reconstructed images and MIPs were obtained and reviewed to evaluate the vascular anatomy. RADIATION DOSE REDUCTION: This exam was performed according to the departmental dose-optimization program which includes automated exposure control, adjustment of the mA and/or kV according to patient size and/or use of iterative reconstruction technique. CONTRAST:  OMNIPAQUE IOHEXOL 350 MG/ML SOLN COMPARISON:  02/19/2023,  02/01/2022 FINDINGS: CTA CHEST FINDINGS Cardiovascular: No evidence of thoracic aortic aneurysm or dissection. There is technically adequate opacification of the pulmonary vasculature. No filling defects or pulmonary emboli. The heart is unremarkable without pericardial effusion. Extensive atherosclerosis of the coronary vasculature. Mediastinum/Nodes: No enlarged mediastinal, hilar, or axillary lymph nodes. Thyroid gland, trachea, and esophagus demonstrate no significant findings. Small hiatal hernia. Lungs/Pleura: Band like areas of  scarring are again seen within the right middle lobe, lingula, bilateral lower lobes. No acute airspace disease, effusion, or pneumothorax. Mild bibasilar bronchiectasis. No bronchial wall thickening. Central airways are patent. Musculoskeletal: No acute or destructive bony abnormalities. Reconstructed images demonstrate no additional findings. Review of the MIP images confirms the above findings. CTA ABDOMEN AND PELVIS FINDINGS VASCULAR Aorta: Normal caliber aorta without aneurysm, dissection, vasculitis or significant stenosis. Celiac: Patent without evidence of aneurysm, dissection, vasculitis or significant stenosis. SMA: Patent without evidence of aneurysm, dissection, vasculitis or significant stenosis. Replaced right hepatic artery off the SMA, and anatomic variant. Renals: Both renal arteries are patent without evidence of aneurysm, dissection, vasculitis, fibromuscular dysplasia or significant stenosis. IMA: Patent without evidence of aneurysm, dissection, vasculitis or significant stenosis. Inflow: Patent without evidence of aneurysm, dissection, vasculitis or significant stenosis. Veins: No obvious venous abnormality within the limitations of this arterial phase study. Review of the MIP images confirms the above findings. NON-VASCULAR Hepatobiliary: No focal liver abnormality is seen. No gallstones, gallbladder wall thickening, or biliary dilatation. Pancreas: Unremarkable.  No pancreatic ductal dilatation or surrounding inflammatory changes. Spleen: Normal in size without focal abnormality. Adrenals/Urinary Tract: Multiple right renal cysts do not require specific imaging follow-up. Otherwise the kidneys enhance normally. No urinary tract calculi or obstructive uropathy within either kidney. The adrenals and bladder are unremarkable. Stomach/Bowel: No bowel obstruction or ileus. Normal appendix right lower quadrant. Scattered diverticulosis of the sigmoid colon without evidence of acute diverticulitis. No bowel wall thickening or inflammatory change. Small hiatal hernia. Lymphatic: No pathologic adenopathy. Reproductive: Uterus and bilateral adnexa are unremarkable. Other: No free fluid or free intraperitoneal gas. No abdominal wall hernia. Musculoskeletal: No acute or destructive bony abnormalities. Reconstructed images demonstrate no additional findings. Review of the MIP images confirms the above findings. IMPRESSION: Vascular: 1. No evidence of thoracoabdominal aortic aneurysm or dissection. 2. No evidence of pulmonary embolus. 3. Coronary artery atherosclerosis. Nonvascular: 1. Small hiatal hernia. 2. Scattered sigmoid diverticulosis without evidence of diverticulitis. 3. Stable areas of bandlike scarring within the mid and lower lung zones. No acute airspace disease. Electronically Signed   By: Sharlet Salina M.D.   On: 02/19/2023 23:05   DG Chest 2 View Result Date: 02/19/2023 CLINICAL DATA:  Chest pain. EXAM: CHEST - 2 VIEW COMPARISON:  10/09/2022. FINDINGS: Linear area of scarring/atelectasis noted in the middle lobe. There are additional linear areas of scarring/atelectasis at the lung bases. Bilateral lung fields are otherwise clear. No acute consolidation or lung collapse. Bilateral costophrenic angles are clear. Stable cardio-mediastinal silhouette. No acute osseous abnormalities. The soft tissues are within normal limits. IMPRESSION: No active cardiopulmonary disease.  Electronically Signed   By: Jules Schick M.D.   On: 02/19/2023 16:11     Assessment and Plan:   Chest pain CAD s/p PCI LAD  and Lcx, most recent in 2014 - patient presented with chest pressure that started 2 days prior to presentation with nausea improved with pain meds. Also had hypoxia and abdominal pain. - h/o stenting in 2013 and 2014. Last heart cath in 2016 at [facility] showing 50% mLAD stenosis with negative FFR, no interventions performed (report above) - HS trop negative x 3 - EKG nonischemic - echo ordered - continue ASA 81mg  daily, Losartan 25mg  daily, Bystolic 5mg  daily, Crestor 5mg  daily - after long discussion, plan for LHC today Risks and benefits of cardiac catheterization have been discussed with the patient.  These include bleeding, infection, kidney damage, stroke, heart attack, death.  The patient understands  these risks and is willing to proceed.  Acute asthma exacerbation Acute respiratory failure - CXR unremarkable - CT showed no acute aortic process and no PE - now on RA  Right upper quadrant abdominal pain - unremarkable RUQ [place]  Dyslipidemia - update lipid panel - continue Crestor 40mg  daily  HTN - BP normal - continue bystolic and Losartan  For questions or updates, please contact [facility] Please consult [email or web address] for contact info under    Signed, Cutter Passey David Stall, PA-C  02/20/2023 7:38 AM

## 2023-02-20 NOTE — Hospital Course (Addendum)
Taken from H&P.  Brenda Craig is a 63 y.o. Caucasian female with medical history significant for asthma, celiac disease, CHF, OSA, CKD, coronary artery disease S/PE STEMI and cardiac arrest in 2014, who presented to the emergency room with acute onset of left-sided chest pain felt as pressure and tightness as well as upper abdominal pain extending like a band involving her epigastric left upper quadrant and more in the right upper quadrant.  She denied any nausea or vomiting.   Vitals stable with mild hypoxia at 88% on room air requiring supplemental oxygen.  Labs with elevated alkaline phosphatase at 161, otherwise unremarkable CMP, BNP normal at 41.4, troponin negative twice.  EKG with normal sinus rhythm. Chest x-ray with no acute cardiopulmonary disease.  2/12: CTA chest, abdomen and pelvis overnight negative for any PE, thoracoabdominal aortic aneurysm or dissection.  Scattered sigmoid diverticulosis without evidence of diverticulitis.  Stable bandlike scarring within the mid and lower lung zones.  No acute airspace disease.  RUQ ultrasound unremarkable.  UA is not consistent with UTI, rest of the labs remained stable.  Based on her cardiac history cardiology would like to get cardiac catheterization which was initially scheduled for today, and then moved to tomorrow morning due to concern of her history of contrast allergy as she will need premedication. Trying some GI cocktail as her symptoms seems more epigastric pain.  2/13: Remained hemodynamically stable, cardiac cath today without any new stenosis and patent PCI.  Cardiology cleared her for discharge but patient does not want to leave stating that I always spend a night after getting cardiac cath in the hospital.

## 2023-02-20 NOTE — Consult Note (Signed)
Cardiology Consultation   Patient ID: Brenda Craig MRN: [id number]; DOB: [birth year]  Admit date: 02/19/2023 Date of Consult: 02/20/2023  PCP:  Jaclyn Shaggy, MD   [facility] Providers Cardiologist:  Julien Nordmann, MD   {   Patient Profile:   Brenda Craig is a 63 y.o. female with a hx of CAD with STEMI complicated by Vfib arrest in 05/2011 s/p PCI to the Lcx with unstable angina in 03/2012 s/p PCI to the LAD, recurrent chest pain, celiac disease, iron ef anemia, HTN, HLD, nephrolithiasis, and OSA who is being seen 02/20/2023 for the evaluation of chest pain at the request of Dr. Nelson Chimes.  History of Present Illness:   Brenda Craig is followed by Dr. Mariah Craig. LHC in 10/2014 showed patent stents with 50% mLAD stenosis with negative FFR. Stress test 11/2020 was without evidence of inducible myocardial ischemia with normal LVSF. She established care with Dr. Mariah Craig in 01/2021 reporting atypical chest pain.   The patient was last seen 12/2021 for pre-op evaluation and was stable for surgery.   The patient presented to the ER 02/19/23 for chest pain. 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 She felt like she was going to pass out. She had severe chest pressure. Says she has had chest pressure for 2 days ago that was dull. Also experienced sharp pain going across her stomach. The nurse called EMS and was brought to the Er. She reported associated nausea. Has SL NTG but has not needed in about 7 years.   In the ER BP 99/59, pulse 73bpm, RR 18, afebrile, 90% O2. Labs showed BG 134, Scr 1.06, Calcium 8.6, K 3.5, sodium 135, WBC 7.6, Hgb 13.4, GFR 59, alk phos 161. HS trop 4>4>2. EKG showed NSR 78bpm, non significant ST changes. Cxr nonacute. CT chest/ab/pelvis showed no aortic process, no PE, small hiatal hernia.  Past Medical History:  Diagnosis Date   Abnormal Pap smear of cervix 1988   Acid reflux 01/17/2013   Allergic rhinitis    Anemia     Anginal pain (HCC)    Anxiety    Arthritis    Asthma    Bronchiectasis (HCC)    Celiac disease    Cervical dysplasia 1988   CHF (congestive heart failure) (HCC)    Chronic kidney disease    Complication of anesthesia    bp drops after anesthesia and has to be kept overnight   Coronary artery disease    Headache    migraine with visual aura   Heart trouble    History of cardiac arrest    History of hiatal hernia    History of kidney stones    History of mammogram 03/25/2013; 12/07/14   BIRADS 1; NEG   History of Papanicolaou smear of cervix 03/20/12; 12/28/15   -/-; -/-   Hypercholesteremia    Hypertension    IBS (irritable bowel syndrome)    Menopausal symptoms    MI (myocardial infarction) (HCC)    Mitral valve prolapse    Pneumonia    PONV (postoperative nausea and vomiting)    nausea only   Sleep apnea    Vulvovaginitis    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    Past Surgical History:  Procedure Laterality Date   ABLATION  2013   [facility]   BACK SURGERY     BACK SURGERY  2016; 2017   L4, L5   CARDIAC SURGERY  03/19/12;  10/2014   HEART CATH AND STENT   CESAREAN SECTION     1986; 1989   COLD KNIFE CONE BIOPSY  1988   COLONOSCOPY  08/2010   16 POLYPS (ALL BENIGN) DX C CELIAC DISEASE   COLONOSCOPY N/A 02/17/2021   Procedure: COLONOSCOPY;  Surgeon: Regis Bill, MD;  Location: [facility] ENDOSCOPY;  Service: Endoscopy;  Laterality: N/A;   COMBINED HYSTEROSCOPY DIAGNOSTIC / D&C  2013   [facility]   CYSTOSCOPY W/ RETROGRADES Left 01/20/2019   Procedure: CYSTOSCOPY WITH RETROGRADE PYELOGRAM;  Surgeon: Riki Altes, MD;  Location: [facility] ORS;  Service: Urology;  Laterality: Left;   CYSTOSCOPY W/ URETERAL STENT PLACEMENT Left 12/13/2018   Procedure: CYSTOSCOPY WITH RETROGRADE PYELOGRAM/URETERAL STENT PLACEMENT;  Surgeon: Bjorn Pippin, MD;  Location: [facility] ORS;  Service: Urology;  Laterality: Left;   CYSTOSCOPY/URETEROSCOPY/HOLMIUM LASER/STENT PLACEMENT Left  01/20/2019   Procedure: CYSTOSCOPY/URETEROSCOPY/HOLMIUM LASER/STENT EXCHANGE;  Surgeon: Riki Altes, MD;  Location: [facility] ORS;  Service: Urology;  Laterality: Left;   DILATION AND CURETTAGE OF UTERUS  2001   ESOPHAGOGASTRODUODENOSCOPY  08/2010   DX C CELIAC DISEASE   ESOPHAGOGASTRODUODENOSCOPY N/A 02/17/2021   Procedure: ESOPHAGOGASTRODUODENOSCOPY (EGD);  Surgeon: Regis Bill, MD;  Location: [facility] ENDOSCOPY;  Service: Endoscopy;  Laterality: N/A;   HEART STENTS  05/27/2011   HIP SURGERY  2016   RIGHT IT BAND AND BURSA REMOVAL   OOPHORECTOMY Right 1982   TOTAL KNEE ARTHROPLASTY Left 01/19/2020   Procedure: TOTAL KNEE ARTHROPLASTY;  Surgeon: Sheral Apley, MD;  Location: [facility] ORS;  Service: Orthopedics;  Laterality: Left;   TOTAL KNEE REVISION Left 03/07/2022   Procedure: TOTAL KNEE REVISION;  Surgeon: Joen Laura, MD;  Location: [facility] ORS;  Service: Orthopedics;  Laterality: Left;     Home Medications:  Prior to Admission medications   Medication Sig Start Date End Date Taking? Authorizing Provider  acetaminophen (TYLENOL) 500 MG tablet Take 1,000 mg by mouth as needed for moderate pain or headache.   Yes [provider]  albuterol (PROVENTIL) (2.5 MG/3ML) 0.083% nebulizer solution Take 3 mLs (2.5 mg total) by nebulization every 4 (four) hours as needed for wheezing or shortness of breath (coughing fits). 12/11/21  Yes Ellamae Sia, DO  albuterol (VENTOLIN HFA) 108 (90 Base) MCG/ACT inhaler Inhale 2 puffs into the lungs as needed for wheezing or shortness of breath.   Yes [provider]  aspirin EC 81 MG tablet Take 81 mg by mouth daily. Swallow whole.   Yes [provider]  cetirizine (ZYRTEC) 10 MG tablet TAKE 1 TABLET BY MOUTH EVERYDAY AT BEDTIME 02/15/23  Yes Kasa, Wallis Bamberg, MD  chlorpheniramine-HYDROcodone (TUSSIONEX) 10-8 MG/5ML Take 5 mLs by mouth at bedtime as needed for cough. 10/03/22  Yes [provider]  clobetasol ointment (TEMOVATE)  0.05 % Apply to affected area every night for 4 weeks, then every other day for 4 weeks and then 1-2 times a week for maintenance 10/02/22  Yes Copland, Alicia B, PA-C  dicyclomine (BENTYL) 10 MG capsule Take 10 mg by mouth as needed for spasms.   Yes [provider]  diphenhydrAMINE (BENADRYL ALLERGY) 25 MG tablet Take 25 mg by mouth as needed for itching.   Yes [provider]  famotidine (PEPCID) 40 MG tablet Take 40 mg by mouth at bedtime. 12/24/18  Yes [provider]  gabapentin (NEURONTIN) 100 MG capsule Take 1 capsule (100 mg total) by mouth 3 (three) times daily. 10/19/22  Yes Glenford Bayley, NP  LORazepam (  ATIVAN) 1 MG tablet Take 1 mg by mouth 2 (two) times daily. 11/14/12  Yes [provider]  losartan (COZAAR) 25 MG tablet TAKE 1 TABLET (25 MG TOTAL) BY MOUTH DAILY. 11/02/22  Yes Gollan, Tollie Pizza, MD  Melatonin 5 MG CAPS Take 5 mg by mouth at bedtime.   Yes [provider]  montelukast (SINGULAIR) 10 MG tablet Take 10 mg by mouth daily. 06/30/21  Yes [provider]  nebivolol (BYSTOLIC) 5 MG tablet TAKE 1 TABLET (5 MG TOTAL) BY MOUTH DAILY WITH SUPPER. 12/26/22  Yes Gollan, Tollie Pizza, MD  nitroGLYCERIN (NITROSTAT) 0.4 MG SL tablet Place 1 tablet (0.4 mg total) under the tongue every 5 (five) minutes as needed for chest pain. 08/04/22  Yes Antonieta Iba, MD  nystatin cream (MYCOSTATIN) Apply 1 Application topically as needed (rash). 01/09/22  Yes [provider]  pantoprazole (PROTONIX) 20 MG tablet Take 40 mg by mouth daily. 05/25/21  Yes [provider]  rosuvastatin (CRESTOR) 10 MG tablet Take 10 mg by mouth at bedtime. 05/28/20  Yes [provider]  triamcinolone cream (KENALOG) 0.1 % Apply 1 Application topically as needed (break out). 01/09/22  Yes [provider]  amoxicillin (AMOXIL) 500 MG capsule Take 2,000 mg by mouth See admin instructions. Before dental appointment Patient not taking:  Reported on 10/19/2022    [provider]  azelastine (ASTELIN) 0.1 % nasal spray Place 2 sprays into both nostrils 2 (two) times daily. Use in each nostril as directed Patient not taking: Reported on 02/20/2023 08/22/22   Erin Fulling, MD  benzonatate (TESSALON) 200 MG capsule Take 400 mg by mouth every 8 (eight) hours as needed. Patient not taking: Reported on 02/20/2023 09/09/22   [provider]  Phenylephrine-DM-GG (TUSSIDEX PO) Take 5 mLs by mouth as needed (cough). Patient not taking: Reported on 02/20/2023    [provider]  triamcinolone (NASACORT) 55 MCG/ACT AERO nasal inhaler Place 1 spray into the nose 2 (two) times daily. Patient not taking: Reported on 02/20/2023 08/22/22 08/22/23  Erin Fulling, MD    Inpatient Medications: Scheduled Meds:  aspirin EC  81 mg Oral Daily   enoxaparin (LOVENOX) injection  0.5 mg/kg Subcutaneous Q24H   famotidine  40 mg Oral QHS   gabapentin  100 mg Oral TID   guaiFENesin  600 mg Oral BID   ipratropium-albuterol  3 mL Nebulization QID   loratadine  10 mg Oral Daily   losartan  25 mg Oral Daily   melatonin  5 mg Oral QHS   montelukast  10 mg Oral Daily   nebivolol  5 mg Oral Q supper   pantoprazole  40 mg Oral Daily   rosuvastatin  10 mg Oral QHS   Continuous Infusions:  sodium chloride 100 mL/hr at 02/20/23 0104   PRN Meds: acetaminophen **OR** acetaminophen, albuterol, chlorpheniramine-HYDROcodone, dicyclomine, LORazepam, morphine injection, nitroGLYCERIN, traZODone  Allergies:    Allergies  Allergen Reactions   Other Other (See Comments)    Gluten allergy Celiac    Codeine Nausea And Vomiting and Rash   Esomeprazole Magnesium Palpitations and Other (See Comments)    IRREGULAR HEARTBEAT   Iodinated Contrast Media Itching   Omeprazole Magnesium Palpitations and Other (See Comments)    IRREGULAR HEARTBEAT   Oxycodone-Acetaminophen Itching, Nausea And Vomiting and Rash    Can take with benadryl   Percocet  [Oxycodone-Acetaminophen] Itching, Nausea And Vomiting and Rash    Can take with benadryl    Tramadol Rash  Can take with benadryl     Social History:   Social History   Socioeconomic History   Marital status: Divorced    Spouse name: Not on file   Number of children: 2   Years of education: 16   Highest education level: Not on file  Occupational History   Occupation: TEACHER  Tobacco Use   Smoking status: Never    Passive exposure: Never   Smokeless tobacco: Never  Vaping Use   Vaping status: Never Used  Substance and Sexual Activity   Alcohol use: Yes    Alcohol/week: 1.0 standard drink of alcohol    Types: 1 Standard drinks or equivalent per week   Drug use: No   Sexual activity: Not Currently    Birth control/protection: Surgical  Other Topics Concern   Not on file  Social History Narrative   Not on file   Social Drivers of Health   Financial Resource Strain: Not on file  Food Insecurity: No Food Insecurity (03/07/2022)   Hunger Vital Sign    Worried About Running Out of Food in the Last Year: Never true    Ran Out of Food in the Last Year: Never true  Transportation Needs: No Transportation Needs (03/07/2022)   PRAPARE - Transportation    Lack of Transportation (Medical): No    Lack of Transportation (Non-Medical): No  Physical Activity: Not on file  Stress: Not on file  Social Connections: Not on file  Intimate Partner Violence: Not At Risk (03/07/2022)   Humiliation, Afraid, Rape, and Kick questionnaire    Fear of Current or Ex-Partner: No    Emotionally Abused: No    Physically Abused: No    Sexually Abused: No    Family History:    Family History  Problem Relation Age of Onset   CAD Father    Transient ischemic attack Father    Diabetes Father    Cerebrovascular Accident Father    Heart disease Father        M-MVP; F BETA;BLOCKERS   Hypothyroidism Father    Hyperthyroidism Mother    Cancer Maternal Uncle        KIDNEY   Uterine cancer  Maternal Aunt 69     ROS:  Please see the history of present illness.   All other ROS reviewed and negative.     Physical Exam/Data:   Vitals:   02/20/23 0109 02/20/23 0145 02/20/23 0635 02/20/23 0730  BP: 109/71  112/68 (!) 126/110  Pulse: 82 85 76 83  Resp: 19 (!) 22 17 15   Temp: 98 F (36.7 C)  (!) 97.5 F (36.4 C)   TempSrc: Oral  Oral   SpO2: 92% 90%  91%  Weight:      Height:       No intake or output data in the 24 hours ending 02/20/23 0738    02/19/2023    2:17 PM 10/19/2022   11:31 AM 09/27/2022    9:17 AM  Last 3 Weights  Weight (lbs) 225 lb 233 lb 9.6 oz 232 lb  Weight (kg) 102.059 kg 105.96 kg 105.235 kg     Body mass index is 37.44 kg/m.  General:  Well nourished, well developed, in no acute distress HEENT: normal Neck: no JVD Vascular: No carotid bruits; Distal pulses 2+ bilaterally Cardiac:  normal S1, S2; RRR; no murmur  Lungs:  clear to auscultation bilaterally, no wheezing, rhonchi or rales  Abd: soft, nontender, no hepatomegaly  Ext: no edema Musculoskeletal:  No  deformities, BUE and BLE strength normal and equal Skin: warm and dry  Neuro:  CNs 2-12 intact, no focal abnormalities noted Psych:  Normal affect   EKG:  The EKG was personally reviewed and demonstrates:  NSR 78bpm, nonspecific T wave changes Telemetry:  Telemetry was personally reviewed and demonstrates:  NSR 80s  Relevant CV Studies:   Lexiscan MPI 11/28/2020 [facility]): CONCLUSION:  1. Normal Lexiscan Myoview stress test without significant inducible myocardial ischemia  2. Normal left ventricular systolic function.  __________   Eugenie Birks MPI 05/26/2019 [facility]): Conclusion: In summary stress test was nondiagnostic negative for ischemia.   Post stress gated tomographic myocardial perfusion imaging:  Comparison of rest and stress images showed: There is normal homogeneous uptake of technetium labeled cardiac to the left ventricular myocardium at rest as well as stress.   No fixed or reversible perfusion defect to suggest ischemia or infarction.  Gated wall motion analysis showed hyperdynamic and normal global and segmental left ventricular contractility and wall motion.  Calculated ejection fraction was 85%.    __________   2D echo 05/22/2016: - Left ventricle: The cavity size was normal. Wall thickness was at    the upper limits of normal. Systolic function was vigorous. The    estimated ejection fraction was in the range of 65% to 70%.    Doppler parameters are consistent with abnormal left ventricular    relaxation (grade 1 diastolic dysfunction).  - Right ventricle: The cavity size was normal. Systolic function    was normal.  - Atrial septum: Agitated saline contrast study showed no    right-to-left atrial level shunt, in the baseline state. __________   Eugenie Birks MPI 12/26/2015 [facility]): Conclusion:  Normal poststress ejection fraction of  75 %.  Normal rest/stress perfusion scan with no evidence of ischemia or infarction. __________   2D echo 08/23/2015: - Left ventricle: The cavity size was normal. Systolic function was    normal. The estimated ejection fraction was in the range of 60%    to 65%. Wall motion was normal; there were no regional wall    motion abnormalities. Left ventricular diastolic function    parameters were normal.  - Left atrium: The atrium was normal in size.  - Right ventricle: Systolic function was normal.  - Pulmonary arteries: Systolic pressure was within the normal    range.   Impressions:   - Normal study. __________   LHC 10/18/2014 [facility]): Left main normal, LAD patent stent with 30% stenosis distal followed by 50% stenosis at the level of D2 (both stenoses were 10% increase from prior), distal LAD 20% stenosis, LCx normal, RCA dominant large vessel with mid and distal 30% stenosis   Final results :  1.  Clinical : Recurrent chest pains etiology undetermined.  2.  Anatomic : Normal LAD and marginal  circumflex stents.  Mild  progression of atherosclerosis about 10% at all focal previous stenosis.  Mid LAD has 50% stenosis.  Negative FFR for hemodynamic significance.    Other stenosis including 30% mid and distal right coronary artery and 30%  followed by 50% mid LAD stenosis.  Ejection fraction improved 55-60%.   Plan:  Continue aspirin and Brilinta.  Increase statin to better control of LDL.  Risk factor control including diet exercise for weight loss.  __________   Eugenie Birks MPI 10/09/2014: There was no ST segment deviation noted during stress. Blood pressure demonstrated a blunted response to exercise. No clear evidence of stress-induced myocardial ischemia Overall ejection fraction of 45%  with inferolateral wall hypokinesis This is considered a low riskHistory This is a low risk study. Nuclear stress EF: 45%. The left ventricular ejection fraction is mildly decreased (45-54%). __________   Stress echo 01/17/2013 ([facility]): INTERPRETATION    NORMAL STRESS TEST. NORMAL RESTING STUDY WITH NO WALL MOTION ABNORMALITIES   AT REST AND PEAK STRESS.   VALVULAR REGURGITATION: TRIVIAL PR, TRIVIAL TR   NO VALVULAR STENOSIS   NORMAL RESTING BP - APPROPRIATE RESPONSE  __________   [facility] 04/15/2012 [facility]): Left main normal, mid LAD 95% stenosis status post PCI/DES, LCx small to moderate size vessel and nondominant, RCA dominant with 20% ostial and proximal stenosis, LVEF 60% ___  Laboratory Data:  High Sensitivity Troponin:   Recent Labs  Lab 02/19/23 1425 02/19/23 1710  TROPONINIHS 4 4     Chemistry Recent Labs  Lab 02/19/23 1425  NA 135  K 3.5  CL 101  CO2 23  GLUCOSE 134*  BUN 20  CREATININE 1.06*  CALCIUM 8.6*  GFRNONAA 59*  ANIONGAP 11    Recent Labs  Lab 02/19/23 1710  PROT 6.9  ALBUMIN 3.8  AST 24  ALT 24  ALKPHOS 161*  BILITOT 0.6   Lipids No results for input(s): "CHOL", "TRIG", "HDL", "LABVLDL", "LDLCALC", "CHOLHDL" in the last 168 hours.   Hematology Recent Labs  Lab 02/19/23 1425 02/20/23 0708  WBC 7.6 7.8  RBC 4.75 4.65  HGB 13.4 13.1  HCT 42.9 40.6  MCV 90.3 87.3  MCH 28.2 28.2  MCHC 31.2 32.3  RDW 13.9 13.8  PLT 276 255   Thyroid No results for input(s): "TSH", "FREET4" in the last 168 hours.  BNP Recent Labs  Lab 02/19/23 1425  BNP 41.4    DDimer No results for input(s): "DDIMER" in the last 168 hours.   Radiology/Studies:  US Abdomen Limited RUQ (LIVER/GB) Result Date: 02/20/2023 CLINICAL DATA:  Abdominal pain. EXAM: ULTRASOUND ABDOMEN LIMITED RIGHT UPPER QUADRANT COMPARISON:  January 03, 2021 FINDINGS: Gallbladder: No gallstones or wall thickening visualized (2.9 mm). No sonographic Murphy sign noted by sonographer. Common bile duct: Diameter: 1.8 mm Liver: No focal lesion identified. Within normal limits in parenchymal echogenicity. Portal vein is patent on color Doppler imaging with normal direction of blood flow towards the liver. Other: None. IMPRESSION: Unremarkable right upper quadrant ultrasound. Electronically Signed   By: Aram Candela M.D.   On: 02/20/2023 01:17   CT Angio Chest/Abd/Pel for Dissection W and/or W/WO Result Date: 02/19/2023 CLINICAL DATA:  Chest pain, epigastric and flank pain, lower back pain EXAM: CT ANGIOGRAPHY CHEST, ABDOMEN AND PELVIS TECHNIQUE: Non-contrast CT of the chest was initially obtained. Multidetector CT imaging through the chest, abdomen and pelvis was performed using the standard protocol during bolus administration of intravenous contrast. Multiplanar reconstructed images and MIPs were obtained and reviewed to evaluate the vascular anatomy. RADIATION DOSE REDUCTION: This exam was performed according to the departmental dose-optimization program which includes automated exposure control, adjustment of the mA and/or kV according to patient size and/or use of iterative reconstruction technique. CONTRAST:  OMNIPAQUE IOHEXOL 350 MG/ML SOLN COMPARISON:  02/19/2023,  02/01/2022 FINDINGS: CTA CHEST FINDINGS Cardiovascular: No evidence of thoracic aortic aneurysm or dissection. There is technically adequate opacification of the pulmonary vasculature. No filling defects or pulmonary emboli. The heart is unremarkable without pericardial effusion. Extensive atherosclerosis of the coronary vasculature. Mediastinum/Nodes: No enlarged mediastinal, hilar, or axillary lymph nodes. Thyroid gland, trachea, and esophagus demonstrate no significant findings. Small hiatal hernia. Lungs/Pleura: Band like areas of  scarring are again seen within the right middle lobe, lingula, bilateral lower lobes. No acute airspace disease, effusion, or pneumothorax. Mild bibasilar bronchiectasis. No bronchial wall thickening. Central airways are patent. Musculoskeletal: No acute or destructive bony abnormalities. Reconstructed images demonstrate no additional findings. Review of the MIP images confirms the above findings. CTA ABDOMEN AND PELVIS FINDINGS VASCULAR Aorta: Normal caliber aorta without aneurysm, dissection, vasculitis or significant stenosis. Celiac: Patent without evidence of aneurysm, dissection, vasculitis or significant stenosis. SMA: Patent without evidence of aneurysm, dissection, vasculitis or significant stenosis. Replaced right hepatic artery off the SMA, and anatomic variant. Renals: Both renal arteries are patent without evidence of aneurysm, dissection, vasculitis, fibromuscular dysplasia or significant stenosis. IMA: Patent without evidence of aneurysm, dissection, vasculitis or significant stenosis. Inflow: Patent without evidence of aneurysm, dissection, vasculitis or significant stenosis. Veins: No obvious venous abnormality within the limitations of this arterial phase study. Review of the MIP images confirms the above findings. NON-VASCULAR Hepatobiliary: No focal liver abnormality is seen. No gallstones, gallbladder wall thickening, or biliary dilatation. Pancreas: Unremarkable.  No pancreatic ductal dilatation or surrounding inflammatory changes. Spleen: Normal in size without focal abnormality. Adrenals/Urinary Tract: Multiple right renal cysts do not require specific imaging follow-up. Otherwise the kidneys enhance normally. No urinary tract calculi or obstructive uropathy within either kidney. The adrenals and bladder are unremarkable. Stomach/Bowel: No bowel obstruction or ileus. Normal appendix right lower quadrant. Scattered diverticulosis of the sigmoid colon without evidence of acute diverticulitis. No bowel wall thickening or inflammatory change. Small hiatal hernia. Lymphatic: No pathologic adenopathy. Reproductive: Uterus and bilateral adnexa are unremarkable. Other: No free fluid or free intraperitoneal gas. No abdominal wall hernia. Musculoskeletal: No acute or destructive bony abnormalities. Reconstructed images demonstrate no additional findings. Review of the MIP images confirms the above findings. IMPRESSION: Vascular: 1. No evidence of thoracoabdominal aortic aneurysm or dissection. 2. No evidence of pulmonary embolus. 3. Coronary artery atherosclerosis. Nonvascular: 1. Small hiatal hernia. 2. Scattered sigmoid diverticulosis without evidence of diverticulitis. 3. Stable areas of bandlike scarring within the mid and lower lung zones. No acute airspace disease. Electronically Signed   By: Sharlet Salina M.D.   On: 02/19/2023 23:05   DG Chest 2 View Result Date: 02/19/2023 CLINICAL DATA:  Chest pain. EXAM: CHEST - 2 VIEW COMPARISON:  10/09/2022. FINDINGS: Linear area of scarring/atelectasis noted in the middle lobe. There are additional linear areas of scarring/atelectasis at the lung bases. Bilateral lung fields are otherwise clear. No acute consolidation or lung collapse. Bilateral costophrenic angles are clear. Stable cardio-mediastinal silhouette. No acute osseous abnormalities. The soft tissues are within normal limits. IMPRESSION: No active cardiopulmonary disease.  Electronically Signed   By: Jules Schick M.D.   On: 02/19/2023 16:11     Assessment and Plan:   Chest pain CAD s/p PCI LAD  and Lcx, most recent in 2014 - patient presented with chest pressure that started 2 days prior to presentation with nausea improved with pain meds. Also had hypoxia and abdominal pain. - h/o stenting in 2013 and 2014. Last heart cath in 2016 at [facility] showing 50% mLAD stenosis with negative FFR, no interventions performed (report above) - HS trop negative x 3 - EKG nonischemic - echo ordered - continue ASA 81mg  daily, Losartan 25mg  daily, Bystolic 5mg  daily, Crestor 5mg  daily - after long discussion, plan for LHC today Risks and benefits of cardiac catheterization have been discussed with the patient.  These include bleeding, infection, kidney damage, stroke, heart attack, death.  The patient understands  these risks and is willing to proceed.  Acute asthma exacerbation Acute respiratory failure - CXR unremarkable - CT showed no acute aortic process and no PE - now on RA  Right upper quadrant abdominal pain - unremarkable RUQ [place]  Dyslipidemia - update lipid panel - continue Crestor 40mg  daily  HTN - BP normal - continue bystolic and Losartan  For questions or updates, please contact [facility] Please consult [email or web address] for contact info under    Signed, Cutter Passey David Stall, PA-C  02/20/2023 7:38 AM

## 2023-02-20 NOTE — Assessment & Plan Note (Signed)
-   We will continue antihypertensive therapy.

## 2023-02-20 NOTE — H&P (Addendum)
[facility]   PATIENT NAME: Brenda Craig    MR#:  [id number]  DATE OF BIRTH:  10-10-1960  DATE OF ADMISSION:  02/19/2023  PRIMARY CARE PHYSICIAN: Jaclyn Shaggy, MD   Patient is coming from: Home  REQUESTING/REFERRING PHYSICIAN: Claybon Jabs, MD   CHIEF COMPLAINT:   Chief Complaint  Patient presents with   Chest Pain    HISTORY OF PRESENT ILLNESS:  Brenda Craig is a 63 y.o. Caucasian female with medical history significant for asthma, celiac disease, CHF, OSA, CKD, coronary artery disease S/PE STEMI and cardiac arrest in 2014, who presented to the emergency room with acute onset of left-sided chest pain felt as pressure and tightness as well as upper abdominal pain extending like a band involving her epigastric left upper quadrant and more in the right upper quadrant.  She denied any nausea or vomiting.  No fever or chills.  She also admits to back pain over the last couple of days with indigestion feeling.  No dysuria, oliguria or hematuria or flank pain.  She had a negative nuclear cardiac stress test more than a year ago in October 2023.  ED Course: When the patient came to the ER, BP was 99/59 with otherwise normal vital signs and later Pulsoxymeter was 88% on room air.  Labs revealed borderline potassium of 3.5 and alk phos of 161 with otherwise unremarkable CMP.  BNP was 41.4.  High-sensitivity troponin I was 4 twice.  CBC was within normal. EKG as reviewed by me : EKG showed normal sinus rhythm with a rate of 78 with T wave inversion in V1. Imaging: Two-view chest x-ray showed no acute cardiopulmonary disease.  The patient was given 4 mg of IV Zofran, 30 mL of p.o. Maalox, 50 mg of IV Benadryl and 40 mg of IV Solu-Medrol before abdominal pelvic CT as well as 500 mL IV normal saline bolus.  She will be admitted to an observation cardiac telemetry bed for further evaluation and management. PAST MEDICAL HISTORY:   Past Medical History:  Diagnosis Date   Abnormal  Pap smear of cervix 1988   Acid reflux 01/17/2013   Allergic rhinitis    Anemia    Anginal pain (HCC)    Anxiety    Arthritis    Asthma    Bronchiectasis (HCC)    Celiac disease    Cervical dysplasia 1988   CHF (congestive heart failure) (HCC)    Chronic kidney disease    Complication of anesthesia    bp drops after anesthesia and has to be kept overnight   Coronary artery disease    Headache    migraine with visual aura   Heart trouble    History of cardiac arrest    History of hiatal hernia    History of kidney stones    History of mammogram 03/25/2013; 12/07/14   BIRADS 1; NEG   History of Papanicolaou smear of cervix 03/20/12; 12/28/15   -/-; -/-   Hypercholesteremia    Hypertension    IBS (irritable bowel syndrome)    Menopausal symptoms    MI (myocardial infarction) (HCC)    Mitral valve prolapse    Pneumonia    PONV (postoperative nausea and vomiting)    nausea only   Sleep apnea    Vulvovaginitis    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    PAST SURGICAL HISTORY:   Past Surgical History:  Procedure Laterality Date   ABLATION  2013   [facility]   BACK SURGERY     BACK SURGERY  2016; 2017   L4, L5   CARDIAC SURGERY  03/19/12; 10/2014   HEART CATH AND STENT   CESAREAN SECTION     1986; 1989   COLD KNIFE CONE BIOPSY  1988   COLONOSCOPY  08/2010   16 POLYPS (ALL BENIGN) DX C CELIAC DISEASE   COLONOSCOPY N/A 02/17/2021   Procedure: COLONOSCOPY;  Surgeon: Regis Bill, MD;  Location: [facility] ENDOSCOPY;  Service: Endoscopy;  Laterality: N/A;   COMBINED HYSTEROSCOPY DIAGNOSTIC / D&C  2013   [facility]   CYSTOSCOPY W/ RETROGRADES Left 01/20/2019   Procedure: CYSTOSCOPY WITH RETROGRADE PYELOGRAM;  Surgeon: Riki Altes, MD;  Location: [facility] ORS;  Service: Urology;  Laterality: Left;   CYSTOSCOPY W/ URETERAL STENT PLACEMENT Left 12/13/2018   Procedure: CYSTOSCOPY WITH RETROGRADE PYELOGRAM/URETERAL STENT PLACEMENT;  Surgeon: Bjorn Pippin, MD;  Location:  [facility] ORS;  Service: Urology;  Laterality: Left;   CYSTOSCOPY/URETEROSCOPY/HOLMIUM LASER/STENT PLACEMENT Left 01/20/2019   Procedure: CYSTOSCOPY/URETEROSCOPY/HOLMIUM LASER/STENT EXCHANGE;  Surgeon: Riki Altes, MD;  Location: [facility] ORS;  Service: Urology;  Laterality: Left;   DILATION AND CURETTAGE OF UTERUS  2001   ESOPHAGOGASTRODUODENOSCOPY  08/2010   DX C CELIAC DISEASE   ESOPHAGOGASTRODUODENOSCOPY N/A 02/17/2021   Procedure: ESOPHAGOGASTRODUODENOSCOPY (EGD);  Surgeon: Regis Bill, MD;  Location: [facility] ENDOSCOPY;  Service: Endoscopy;  Laterality: N/A;   HEART STENTS  05/27/2011   HIP SURGERY  2016   RIGHT IT BAND AND BURSA REMOVAL   OOPHORECTOMY Right 1982   TOTAL KNEE ARTHROPLASTY Left 01/19/2020   Procedure: TOTAL KNEE ARTHROPLASTY;  Surgeon: Sheral Apley, MD;  Location: [facility] ORS;  Service: Orthopedics;  Laterality: Left;   TOTAL KNEE REVISION Left 03/07/2022   Procedure: TOTAL KNEE REVISION;  Surgeon: Joen Laura, MD;  Location: [facility] ORS;  Service: Orthopedics;  Laterality: Left;    SOCIAL HISTORY:   Social History   Tobacco Use   Smoking status: Never    Passive exposure: Never   Smokeless tobacco: Never  Substance Use Topics   Alcohol use: Yes    Alcohol/week: 1.0 standard drink of alcohol    Types: 1 Standard drinks or equivalent per week    FAMILY HISTORY:   Family History  Problem Relation Age of Onset   CAD Father    Transient ischemic attack Father    Diabetes Father    Cerebrovascular Accident Father    Heart disease Father        M-MVP; F BETA;BLOCKERS   Hypothyroidism Father    Hyperthyroidism Mother    Cancer Maternal Uncle        KIDNEY   Uterine cancer Maternal Aunt 69    DRUG ALLERGIES:   Allergies  Allergen Reactions   Other Other (See Comments)    Gluten allergy Celiac    Codeine Nausea And Vomiting and Rash   Esomeprazole Magnesium Palpitations and Other (See Comments)    IRREGULAR HEARTBEAT   Iodinated Contrast Media  Itching   Omeprazole Magnesium Palpitations and Other (See Comments)    IRREGULAR HEARTBEAT   Oxycodone-Acetaminophen Itching, Nausea And Vomiting and Rash    Can take with benadryl   Percocet [Oxycodone-Acetaminophen] Itching, Nausea And Vomiting and Rash    Can take with benadryl    Tramadol Rash    Can take with benadryl     REVIEW OF SYSTEMS:   ROS As per history of present illness. All pertinent systems were reviewed  above. Constitutional, HEENT, cardiovascular, respiratory, GI, GU, musculoskeletal, neuro, psychiatric, endocrine, integumentary and hematologic systems were reviewed and are otherwise negative/unremarkable except for positive findings mentioned above in the HPI.   MEDICATIONS AT HOME:   Prior to Admission medications   Medication Sig Start Date End Date Taking? Authorizing Provider  acetaminophen (TYLENOL) 500 MG tablet Take 1,000 mg by mouth as needed for moderate pain or headache.    [provider]  albuterol (PROVENTIL) (2.5 MG/3ML) 0.083% nebulizer solution Take 3 mLs (2.5 mg total) by nebulization every 4 (four) hours as needed for wheezing or shortness of breath (coughing fits). Patient taking differently: Take 2.5 mg by nebulization as needed for wheezing or shortness of breath (coughing fits). 12/11/21   Ellamae Sia, DO  albuterol (VENTOLIN HFA) 108 (90 Base) MCG/ACT inhaler Inhale 2 puffs into the lungs as needed for wheezing or shortness of breath.    [provider]  amoxicillin (AMOXIL) 500 MG capsule Take 2,000 mg by mouth See admin instructions. Before dental appointment Patient not taking: Reported on 10/19/2022    [provider]  aspirin EC 81 MG tablet Take 81 mg by mouth daily. Swallow whole.    [provider]  azelastine (ASTELIN) 0.1 % nasal spray Place 2 sprays into both nostrils 2 (two) times daily. Use in each nostril as directed 08/22/22   Erin Fulling, MD  benzonatate (TESSALON) 200 MG capsule Take 400 mg by  mouth every 8 (eight) hours as needed. 09/09/22   [provider]  cetirizine (ZYRTEC) 10 MG tablet TAKE 1 TABLET BY MOUTH EVERYDAY AT BEDTIME 02/15/23   Erin Fulling, MD  chlorpheniramine-HYDROcodone (TUSSIONEX) 10-8 MG/5ML Take 5 mLs by mouth at bedtime as needed for cough. 10/03/22   [provider]  clobetasol ointment (TEMOVATE) 0.05 % Apply to affected area every night for 4 weeks, then every other day for 4 weeks and then 1-2 times a week for maintenance 10/02/22   Copland, Alicia B, PA-C  dicyclomine (BENTYL) 10 MG capsule Take 10 mg by mouth as needed for spasms.    [provider]  diphenhydrAMINE (BENADRYL ALLERGY) 25 MG tablet Take 25 mg by mouth as needed for itching.    [provider]  famotidine (PEPCID) 40 MG tablet Take 40 mg by mouth at bedtime. 12/24/18   [provider]  gabapentin (NEURONTIN) 100 MG capsule Take 1 capsule (100 mg total) by mouth 3 (three) times daily. 10/19/22   Glenford Bayley, NP  LORazepam (ATIVAN) 1 MG tablet Take 1 mg by mouth 2 (two) times daily. 11/14/12   [provider]  losartan (COZAAR) 25 MG tablet TAKE 1 TABLET (25 MG TOTAL) BY MOUTH DAILY. 11/02/22   Antonieta Iba, MD  Melatonin 5 MG CAPS Take 5 mg by mouth at bedtime.    [provider]  montelukast (SINGULAIR) 10 MG tablet Take 10 mg by mouth daily. 06/30/21   [provider]  nebivolol (BYSTOLIC) 5 MG tablet TAKE 1 TABLET (5 MG TOTAL) BY MOUTH DAILY WITH SUPPER. 12/26/22   Gollan, Tollie Pizza, MD  nitroGLYCERIN (NITROSTAT) 0.4 MG SL tablet Place 1 tablet (0.4 mg total) under the tongue every 5 (five) minutes as needed for chest pain. 08/04/22   Antonieta Iba, MD  nystatin cream (MYCOSTATIN) Apply 1 Application topically as needed (rash). 01/09/22   [provider]  pantoprazole (PROTONIX) 20 MG tablet Take 40 mg by mouth daily. 05/25/21   [provider]  Phenylephrine-DM-GG (  TUSSIDEX PO) Take 5 mLs by mouth  as needed (cough).    [provider]  rosuvastatin (CRESTOR) 10 MG tablet Take 10 mg by mouth at bedtime. 05/28/20   [provider]  triamcinolone (NASACORT) 55 MCG/ACT AERO nasal inhaler Place 1 spray into the nose 2 (two) times daily. 08/22/22 08/22/23  Erin Fulling, MD  triamcinolone cream (KENALOG) 0.1 % Apply 1 Application topically as needed (break out). 01/09/22   [provider]      VITAL SIGNS:  Blood pressure 109/71, pulse 85, temperature 98 F (36.7 C), temperature source Oral, resp. rate (!) 22, height 5\' 5"  (1.651 m), weight 102.1 kg, SpO2 90%.  PHYSICAL EXAMINATION:  Physical Exam  GENERAL:  63 y.o.-year-old patient lying in the bed with no acute distress.  EYES: Pupils equal, round, reactive to light and accommodation. No scleral icterus. Extraocular muscles intact.  HEENT: Head atraumatic, normocephalic. Oropharynx and nasopharynx clear.  NECK:  Supple, no jugular venous distention. No thyroid enlargement, no tenderness.  LUNGS: Diminished expiratory airflow with harsh vesicular breathing and occasional expiratory wheezes.. No use of accessory muscles of respiration.  CARDIOVASCULAR: Regular rate and rhythm, S1, S2 normal. No murmurs, rubs, or gallops.  ABDOMEN: Soft, nondistended, nontender. Bowel sounds present. No organomegaly or mass.  EXTREMITIES: No pedal edema, cyanosis, or clubbing.  NEUROLOGIC: Cranial nerves II through XII are intact. Muscle strength 5/5 in all extremities. Sensation intact. Gait not checked.  PSYCHIATRIC: The patient is alert and oriented x 3.  Normal affect and good eye contact. SKIN: No obvious rash, lesion, or ulcer.   LABORATORY PANEL:   CBC Recent Labs  Lab 02/19/23 1425  WBC 7.6  HGB 13.4  HCT 42.9  PLT 276   ------------------------------------------------------------------------------------------------------------------  Chemistries  Recent Labs  Lab 02/19/23 1425 02/19/23 1710  NA 135  --   K  3.5  --   CL 101  --   CO2 23  --   GLUCOSE 134*  --   BUN 20  --   CREATININE 1.06*  --   CALCIUM 8.6*  --   AST  --  24  ALT  --  24  ALKPHOS  --  161*  BILITOT  --  0.6   ------------------------------------------------------------------------------------------------------------------  Cardiac Enzymes No results for input(s): "TROPONINI" in the last 168 hours. ------------------------------------------------------------------------------------------------------------------  RADIOLOGY:  US Abdomen Limited RUQ (LIVER/GB) Result Date: 02/20/2023 CLINICAL DATA:  Abdominal pain. EXAM: ULTRASOUND ABDOMEN LIMITED RIGHT UPPER QUADRANT COMPARISON:  January 03, 2021 FINDINGS: Gallbladder: No gallstones or wall thickening visualized (2.9 mm). No sonographic Murphy sign noted by sonographer. Common bile duct: Diameter: 1.8 mm Liver: No focal lesion identified. Within normal limits in parenchymal echogenicity. Portal vein is patent on color Doppler imaging with normal direction of blood flow towards the liver. Other: None. IMPRESSION: Unremarkable right upper quadrant ultrasound. Electronically Signed   By: Aram Candela M.D.   On: 02/20/2023 01:17   CT Angio Chest/Abd/Pel for Dissection W and/or W/WO Result Date: 02/19/2023 CLINICAL DATA:  Chest pain, epigastric and flank pain, lower back pain EXAM: CT ANGIOGRAPHY CHEST, ABDOMEN AND PELVIS TECHNIQUE: Non-contrast CT of the chest was initially obtained. Multidetector CT imaging through the chest, abdomen and pelvis was performed using the standard protocol during bolus administration of intravenous contrast. Multiplanar reconstructed images and MIPs were obtained and reviewed to evaluate the vascular anatomy. RADIATION DOSE REDUCTION: This exam was performed according to the departmental dose-optimization program which includes automated exposure control, adjustment of the mA and/or  kV according to patient size and/or use of iterative  reconstruction technique. CONTRAST:  OMNIPAQUE IOHEXOL 350 MG/ML SOLN COMPARISON:  02/19/2023, 02/01/2022 FINDINGS: CTA CHEST FINDINGS Cardiovascular: No evidence of thoracic aortic aneurysm or dissection. There is technically adequate opacification of the pulmonary vasculature. No filling defects or pulmonary emboli. The heart is unremarkable without pericardial effusion. Extensive atherosclerosis of the coronary vasculature. Mediastinum/Nodes: No enlarged mediastinal, hilar, or axillary lymph nodes. Thyroid gland, trachea, and esophagus demonstrate no significant findings. Small hiatal hernia. Lungs/Pleura: Band like areas of scarring are again seen within the right middle lobe, lingula, bilateral lower lobes. No acute airspace disease, effusion, or pneumothorax. Mild bibasilar bronchiectasis. No bronchial wall thickening. Central airways are patent. Musculoskeletal: No acute or destructive bony abnormalities. Reconstructed images demonstrate no additional findings. Review of the MIP images confirms the above findings. CTA ABDOMEN AND PELVIS FINDINGS VASCULAR Aorta: Normal caliber aorta without aneurysm, dissection, vasculitis or significant stenosis. Celiac: Patent without evidence of aneurysm, dissection, vasculitis or significant stenosis. SMA: Patent without evidence of aneurysm, dissection, vasculitis or significant stenosis. Replaced right hepatic artery off the SMA, and anatomic variant. Renals: Both renal arteries are patent without evidence of aneurysm, dissection, vasculitis, fibromuscular dysplasia or significant stenosis. IMA: Patent without evidence of aneurysm, dissection, vasculitis or significant stenosis. Inflow: Patent without evidence of aneurysm, dissection, vasculitis or significant stenosis. Veins: No obvious venous abnormality within the limitations of this arterial phase study. Review of the MIP images confirms the above findings. NON-VASCULAR Hepatobiliary: No focal liver abnormality  is seen. No gallstones, gallbladder wall thickening, or biliary dilatation. Pancreas: Unremarkable. No pancreatic ductal dilatation or surrounding inflammatory changes. Spleen: Normal in size without focal abnormality. Adrenals/Urinary Tract: Multiple right renal cysts do not require specific imaging follow-up. Otherwise the kidneys enhance normally. No urinary tract calculi or obstructive uropathy within either kidney. The adrenals and bladder are unremarkable. Stomach/Bowel: No bowel obstruction or ileus. Normal appendix right lower quadrant. Scattered diverticulosis of the sigmoid colon without evidence of acute diverticulitis. No bowel wall thickening or inflammatory change. Small hiatal hernia. Lymphatic: No pathologic adenopathy. Reproductive: Uterus and bilateral adnexa are unremarkable. Other: No free fluid or free intraperitoneal gas. No abdominal wall hernia. Musculoskeletal: No acute or destructive bony abnormalities. Reconstructed images demonstrate no additional findings. Review of the MIP images confirms the above findings. IMPRESSION: Vascular: 1. No evidence of thoracoabdominal aortic aneurysm or dissection. 2. No evidence of pulmonary embolus. 3. Coronary artery atherosclerosis. Nonvascular: 1. Small hiatal hernia. 2. Scattered sigmoid diverticulosis without evidence of diverticulitis. 3. Stable areas of bandlike scarring within the mid and lower lung zones. No acute airspace disease. Electronically Signed   By: Sharlet Salina M.D.   On: 02/19/2023 23:05   DG Chest 2 View Result Date: 02/19/2023 CLINICAL DATA:  Chest pain. EXAM: CHEST - 2 VIEW COMPARISON:  10/09/2022. FINDINGS: Linear area of scarring/atelectasis noted in the middle lobe. There are additional linear areas of scarring/atelectasis at the lung bases. Bilateral lung fields are otherwise clear. No acute consolidation or lung collapse. Bilateral costophrenic angles are clear. Stable cardio-mediastinal silhouette. No acute osseous  abnormalities. The soft tissues are within normal limits. IMPRESSION: No active cardiopulmonary disease. Electronically Signed   By: Jules Schick M.D.   On: 02/19/2023 16:11      IMPRESSION AND PLAN:  Assessment and Plan: * Chest pain - The patient will be admitted to an observation cardiac telemetry bed. - The patient has a history of STEMI and cardiac arrest status post PCI and LAD  stent in March 2014. - Will follow serial troponins and EKGs. - The patient will be placed on aspirin as well as p.r.n. sublingual nitroglycerin and morphine sulfate for pain. - We will obtain a cardiology consult in a.m. for further cardiac risk stratification. - I notified [facility] group about the patient   Asthma, chronic, unspecified asthma severity, with acute exacerbation - The patient will be placed on scheduled and as needed DuoNebs. - Mucolytic therapy will be provided. - Will place on steroid therapy with IV Solu-Medrol.  Acute respiratory failure with hypoxia (HCC) - Chest CTA showed no specific reasons for her hypoxia and ruled out PE. - This is likely secondary to asthma exacerbation. - O2 protocol will be followed. - Management otherwise as above.  Right upper quadrant abdominal pain - We we will check right upper quadrant abdominal ultrasound for further assessment.  Dyslipidemia - We will continue statin therapy.  GERD without esophagitis - We will continue PPI therapy and H2 blocker therapy.  Essential hypertension - We will continue antihypertensive therapy.  Anxiety - This could be significantly contributing to her symptoms. - We will continue antianxiety medications.   DVT prophylaxis: Lovenox.  Advanced Care Planning:  Code Status: full code.  Family Communication:  The plan of care was discussed in details with the patient (and family). I answered all questions. The patient agreed to proceed with the above mentioned plan. Further management will depend upon hospital  course. Disposition Plan: Back to previous home environment Consults called: Cardiology All the records are reviewed and case discussed with ED provider.  Status is: Observation  I certify that at the time of admission, it is my clinical judgment that the patient will require hospital care extending less than 2 midnights.                            Dispo: The patient is from: Home              Anticipated d/c is to: Home              Patient currently is not medically stable to d/c.              Difficult to place patient: No  Hannah Beat M.D on 02/20/2023 at 2:04 AM  [facility]   From 7 PM-7 AM, contact night-coverage [email or web address]  CC: Primary care physician; Jaclyn Shaggy, MD

## 2023-02-21 ENCOUNTER — Encounter
Admission: EM | Disposition: A | Discharge status: Home / Self Care | Payer: Self-pay | Source: Home / Self Care | Attending: Emergency Medicine

## 2023-02-21 DIAGNOSIS — R079 Chest pain, unspecified: Secondary | ICD-10-CM | POA: Diagnosis not present

## 2023-02-21 DIAGNOSIS — I10 Essential (primary) hypertension: Secondary | ICD-10-CM | POA: Diagnosis not present

## 2023-02-21 DIAGNOSIS — I25118 Atherosclerotic heart disease of native coronary artery with other forms of angina pectoris: Principal | ICD-10-CM

## 2023-02-21 DIAGNOSIS — R1013 Epigastric pain: Secondary | ICD-10-CM | POA: Diagnosis not present

## 2023-02-21 DIAGNOSIS — M545 Low back pain, unspecified: Secondary | ICD-10-CM

## 2023-02-21 DIAGNOSIS — E785 Hyperlipidemia, unspecified: Secondary | ICD-10-CM | POA: Diagnosis not present

## 2023-02-21 DIAGNOSIS — R0902 Hypoxemia: Secondary | ICD-10-CM | POA: Diagnosis not present

## 2023-02-21 HISTORY — PX: LEFT HEART CATH AND CORONARY ANGIOGRAPHY: CATH118249

## 2023-02-21 LAB — LIPID PANEL
Cholesterol: 128 mg/dL (ref 0–200)
HDL: 57 mg/dL (ref >40)
LDL Cholesterol: 56 mg/dL (ref 0–99)
Total CHOL/HDL Ratio: 2.2 {ratio}
Triglycerides: 74 mg/dL (ref <150)
VLDL: 15 mg/dL (ref 0–40)

## 2023-02-21 SURGERY — LEFT HEART CATH AND CORONARY ANGIOGRAPHY
Anesthesia: Moderate Sedation

## 2023-02-21 MED ORDER — LIDOCAINE HCL (PF) 1 % IJ SOLN
INTRAMUSCULAR | Status: DC | PRN
  Administered 2023-02-21: 2 mL

## 2023-02-21 MED ORDER — HEPARIN (PORCINE) IN NACL 1000-0.9 UT/500ML-% IV SOLN
INTRAVENOUS | Status: AC
  Filled 2023-02-21: qty 1000

## 2023-02-21 MED ORDER — VERAPAMIL HCL 2.5 MG/ML IV SOLN
INTRAVENOUS | Status: AC
  Filled 2023-02-21: qty 2

## 2023-02-21 MED ORDER — ONDANSETRON HCL 4 MG/2ML IJ SOLN
INTRAMUSCULAR | Status: AC
Start: 2023-02-21 — End: ?
  Filled 2023-02-21: qty 2

## 2023-02-21 MED ORDER — HYDRALAZINE HCL 20 MG/ML IJ SOLN: 10.0000 mg | INTRAMUSCULAR | Status: AC | PRN

## 2023-02-21 MED ORDER — HEPARIN SODIUM (PORCINE) 1000 UNIT/ML IJ SOLN
INTRAMUSCULAR | Status: AC
  Filled 2023-02-21: qty 10

## 2023-02-21 MED ORDER — IOHEXOL 300 MG/ML  SOLN
INTRAMUSCULAR | Status: DC | PRN
  Administered 2023-02-21: 40 mL

## 2023-02-21 MED ORDER — DIPHENHYDRAMINE HCL 12.5 MG/5ML PO ELIX: 50.0000 mg | ORAL_SOLUTION | Freq: Four times a day (QID) | ORAL | Status: DC | PRN

## 2023-02-21 MED ORDER — SODIUM CHLORIDE 0.9% FLUSH
3.0000 mL | Freq: Two times a day (BID) | INTRAVENOUS | Status: DC
Start: 2023-02-21 — End: 2023-02-22
  Administered 2023-02-21: 3 mL via INTRAVENOUS

## 2023-02-21 MED ORDER — MIDAZOLAM HCL 2 MG/2ML IJ SOLN
INTRAMUSCULAR | Status: AC
  Filled 2023-02-21: qty 2

## 2023-02-21 MED ORDER — SODIUM CHLORIDE 0.9 % IV SOLN: 250.0000 mL | INTRAVENOUS | Status: DC | PRN

## 2023-02-21 MED ORDER — LABETALOL HCL 5 MG/ML IV SOLN: 10.0000 mg | INTRAVENOUS | Status: AC | PRN

## 2023-02-21 MED ORDER — LIDOCAINE HCL 1 % IJ SOLN
INTRAMUSCULAR | Status: AC
  Filled 2023-02-21: qty 20

## 2023-02-21 MED ORDER — ONDANSETRON HCL 4 MG/2ML IJ SOLN: 4.0000 mg | Freq: Four times a day (QID) | INTRAMUSCULAR | Status: DC | PRN

## 2023-02-21 MED ORDER — FENTANYL CITRATE (PF) 100 MCG/2ML IJ SOLN
INTRAMUSCULAR | Status: AC
  Filled 2023-02-21: qty 2

## 2023-02-21 MED ORDER — MIDAZOLAM HCL 2 MG/2ML IJ SOLN
INTRAMUSCULAR | Status: DC | PRN
  Administered 2023-02-21: 2 mg via INTRAVENOUS

## 2023-02-21 MED ORDER — HEPARIN (PORCINE) IN NACL 2000-0.9 UNIT/L-% IV SOLN
INTRAVENOUS | Status: DC | PRN
  Administered 2023-02-21: 1000 mL

## 2023-02-21 MED ORDER — SODIUM CHLORIDE 0.9% FLUSH: 3.0000 mL | INTRAVENOUS | Status: DC | PRN

## 2023-02-21 MED ORDER — VERAPAMIL HCL 2.5 MG/ML IV SOLN
INTRAVENOUS | Status: DC | PRN
  Administered 2023-02-21: 2.5 mg via INTRA_ARTERIAL

## 2023-02-21 MED ORDER — HEPARIN SODIUM (PORCINE) 1000 UNIT/ML IJ SOLN
INTRAMUSCULAR | Status: DC | PRN
  Administered 2023-02-21: 5000 [IU] via INTRAVENOUS

## 2023-02-21 MED ORDER — SODIUM CHLORIDE 0.9 % IV SOLN
INTRAVENOUS | Status: AC
Start: 2023-02-21 — End: 2023-02-21

## 2023-02-21 SURGICAL SUPPLY — 10 items
CATH 5F 110X4 TIG (CATHETERS) IMPLANT
DEVICE RAD TR BAND REGULAR (VASCULAR PRODUCTS) IMPLANT
DRAPE BRACHIAL (DRAPES) IMPLANT
GUIDEWIRE INQWIRE 1.5J.035X260 (WIRE) IMPLANT
INQWIRE 1.5J .035X260CM (WIRE) ×1
PACK CARDIAC CATH (CUSTOM PROCEDURE TRAY) ×1 IMPLANT
PROTECTION STATION PRESSURIZED (MISCELLANEOUS) ×1
SET ATX-X65L (MISCELLANEOUS) IMPLANT
SHEATH RAIN RADIAL 21G 6FR (SHEATH) IMPLANT
STATION PROTECTION PRESSURIZED (MISCELLANEOUS) IMPLANT

## 2023-02-21 NOTE — Progress Notes (Signed)
Progress Note   Patient: Brenda Craig AVW:[id number] DOB: 27-Jan-1960 DOA: 02/19/2023     0 DOS: the patient was seen and examined on 02/21/2023   Brief hospital course: Taken from H&P.  KHRYSTINA BONNES is a 63 y.o. Caucasian female with medical history significant for asthma, celiac disease, CHF, OSA, CKD, coronary artery disease S/PE STEMI and cardiac arrest in 2014, who presented to the emergency room with acute onset of left-sided chest pain felt as pressure and tightness as well as upper abdominal pain extending like a band involving her epigastric left upper quadrant and more in the right upper quadrant.  She denied any nausea or vomiting.   Vitals stable with mild hypoxia at 88% on room air requiring supplemental oxygen.  Labs with elevated alkaline phosphatase at 161, otherwise unremarkable CMP, BNP normal at 41.4, troponin negative twice.  EKG with normal sinus rhythm. Chest x-ray with no acute cardiopulmonary disease.  2/12: CTA chest, abdomen and pelvis overnight negative for any PE, thoracoabdominal aortic aneurysm or dissection.  Scattered sigmoid diverticulosis without evidence of diverticulitis.  Stable bandlike scarring within the mid and lower lung zones.  No acute airspace disease.  RUQ ultrasound unremarkable.  UA is not consistent with UTI, rest of the labs remained stable.  Based on her cardiac history cardiology would like to get cardiac catheterization which was initially scheduled for today, and then moved to tomorrow morning due to concern of her history of contrast allergy as she will need premedication. Trying some GI cocktail as her symptoms seems more epigastric pain.  2/13: Remained hemodynamically stable, cardiac cath today without any new stenosis and patent PCI.  Cardiology cleared her for discharge but patient does not want to leave stating that I always spend a night after getting cardiac cath in the hospital.   Assessment and Plan: * Chest pain The  patient has a history of STEMI and cardiac arrest status post PCI and LAD stent in March 2014. Patient has atypical chest versus epigastric pain.  CTA chest, abdomen and pelvis was negative for PE, any aneurysm or dissection.  No other significant abnormality noted.  Cardiac cath today was negative for any new or acute abnormality. -Continue home aspirin, losartan and Crestor   Asthma, chronic, unspecified asthma severity, with acute exacerbation No concern of exacerbation at this time. On room air. -Continue with as needed bronchodilator  Acute respiratory failure with hypoxia (HCC)  Chest CTA showed no specific reasons for her hypoxia and ruled out PE. Patient was weaned back to room air. -Continue with supportive care and supplemental oxygen as needed  Right upper quadrant abdominal pain RUQ ultrasound unremarkable. Pain seems improved  Dyslipidemia - We will continue statin therapy.  GERD without esophagitis - We will continue PPI therapy and H2 blocker therapy.  Essential hypertension - We will continue antihypertensive therapy.  Anxiety - This could be significantly contributing to her symptoms. - We will continue antianxiety medications.   Subjective: Patient continued to have atypical epigastric/lower substernal chest pain radiating to back, seems crampy in nature.  She does not want to leave after cardiac cath stating that she has to spend 1 night after the cath before leaving.  Physical Exam: Vitals:   02/21/23 1515 02/21/23 1530 02/21/23 1545 02/21/23 1600  BP: 104/70 99/61 101/64 111/68  Pulse: 67 79 73 79  Resp: (!) 22 (!) 24 (!) 22 (!) 27  Temp:      TempSrc:      SpO2: 93% 92% 92%  92%  Weight:      Height:       General.  Obese lady, in no acute distress. Pulmonary.  Lungs clear bilaterally, normal respiratory effort. CV.  Regular rate and rhythm, no JVD, rub or murmur. Abdomen.  Soft, nontender, nondistended, BS positive. CNS.  Alert and oriented .   No focal neurologic deficit. Extremities.  No edema, no cyanosis, pulses intact and symmetrical. Psychiatry.  Judgment and insight appears normal.   Data Reviewed: Prior data reviewed  Family Communication: Discussed with patient  Disposition: Status is: Observation The patient will require care spanning > 2 midnights and should be moved to inpatient because: Need cardiac catheterization before discharge  Planned Discharge Destination: Home  DVT prophylaxis.  Lovenox Time spent: 40 minutes  This record has been created using Conservation officer, historic buildings. Errors have been sought and corrected,but may not always be located. Such creation errors do not reflect on the standard of care.   Author: Arnetha Courser, MD 02/21/2023 4:16 PM  For on call review [email or web address].

## 2023-02-21 NOTE — Interval H&P Note (Signed)
History and Physical Interval Note:  02/21/2023 12:43 PM  Brenda Craig  has presented today for surgery, with the diagnosis of chest pain / Unstable Angina.  The various methods of treatment have been discussed with the patient and family. After consideration of risks, benefits and other options for treatment, the patient has consented to  Procedure(s): LEFT HEART CATH AND CORONARY ANGIOGRAPHY (N/A)  PERCUTANEOUS CORONARY INTERVENTION  as a surgical intervention.  The patient's history has been reviewed, patient examined, no change in status, stable for surgery.  I have reviewed the patient's chart and labs.  Questions were answered to the patient's satisfaction.    Cath Lab Visit (complete for each Cath Lab visit)  Clinical Evaluation Leading to the Procedure:   ACS: Yes.    Non-ACS:    Anginal Classification: CCS IV  Anti-ischemic medical therapy: Minimal Therapy (1 class of medications)  Non-Invasive Test Results: No non-invasive testing performed  Prior CABG: No previous CABG   Bryan Lemma

## 2023-02-21 NOTE — Assessment & Plan Note (Signed)
The patient has a history of STEMI and cardiac arrest status post PCI and LAD stent in March 2014. Patient has atypical chest versus epigastric pain.  CTA chest, abdomen and pelvis was negative for PE, any aneurysm or dissection.  No other significant abnormality noted.  Cardiac cath today was negative for any new or acute abnormality. -Continue home aspirin, losartan and Crestor

## 2023-02-21 NOTE — Progress Notes (Signed)
Rounding Note    Patient Name: Brenda Craig Date of Encounter: 02/21/2023  [facility] Cardiologist: Julien Nordmann, MD   Subjective   Patient feeling better this morning. Scheduled for cardiac catheterization today.   Inpatient Medications    Scheduled Meds:  alum & mag hydroxide-simeth  30 mL Oral Once   aspirin EC  81 mg Oral Daily   enoxaparin (LOVENOX) injection  0.5 mg/kg Subcutaneous Q24H   famotidine  40 mg Oral QHS   gabapentin  100 mg Oral TID   guaiFENesin  600 mg Oral BID   ipratropium-albuterol  3 mL Nebulization Q6H   loratadine  10 mg Oral Daily   losartan  25 mg Oral Daily   melatonin  5 mg Oral QHS   montelukast  10 mg Oral Daily   nebivolol  5 mg Oral Q supper   pantoprazole  40 mg Oral Daily   rosuvastatin  10 mg Oral QHS   Continuous Infusions:  sodium chloride 1 mL/kg/hr (02/21/23 0628)   PRN Meds: acetaminophen **OR** acetaminophen, albuterol, benzonatate, chlorpheniramine-HYDROcodone, dicyclomine, diphenhydrAMINE, LORazepam, morphine injection, nitroGLYCERIN, traZODone   Vital Signs    Vitals:   02/20/23 2340 02/21/23 0000 02/21/23 0418 02/21/23 0727  BP:  (!) 92/57 137/76   Pulse:  69 74   Resp: 19 20 18    Temp:  97.8 F (36.6 C) 97.6 F (36.4 C)   TempSrc:   Oral   SpO2:  91% 94% 92%  Weight:      Height:        Intake/Output Summary (Last 24 hours) at 02/21/2023 0820 Last data filed at 02/21/2023 1610 Gross per 24 hour  Intake 1397.53 ml  Output --  Net 1397.53 ml      02/19/2023    2:17 PM 10/19/2022   11:31 AM 09/27/2022    9:17 AM  Last 3 Weights  Weight (lbs) 225 lb 233 lb 9.6 oz 232 lb  Weight (kg) 102.059 kg 105.96 kg 105.235 kg      Physical Exam   GEN: No acute distress.   Neck: No JVD Cardiac: RRR, no murmurs, rubs, or gallops.  Respiratory: Clear to auscultation bilaterally. GI: Soft, nontender, non-distended  MS: No edema; No deformity. Neuro:  Nonfocal  Psych: Normal affect   Labs     High Sensitivity Troponin:   Recent Labs  Lab 02/19/23 1425 02/19/23 1710 02/20/23 0708  TROPONINIHS 4 4 2      Chemistry Recent Labs  Lab 02/19/23 1425 02/19/23 1710 02/20/23 0708  NA 135  --  137  K 3.5  --  4.7  CL 101  --  106  CO2 23  --  24  GLUCOSE 134*  --  154*  BUN 20  --  15  CREATININE 1.06*  --  0.88  CALCIUM 8.6*  --  8.9  PROT  --  6.9  --   ALBUMIN  --  3.8  --   AST  --  24  --   ALT  --  24  --   ALKPHOS  --  161*  --   BILITOT  --  0.6  --   GFRNONAA 59*  --  >60  ANIONGAP 11  --  7    Lipids  Recent Labs  Lab 02/21/23 0555  CHOL 128  TRIG 74  HDL 57  LDLCALC 56  CHOLHDL 2.2    Hematology Recent Labs  Lab 02/19/23 1425 02/20/23 0708  WBC 7.6 7.8  RBC 4.75 4.65  HGB 13.4 13.1  HCT 42.9 40.6  MCV 90.3 87.3  MCH 28.2 28.2  MCHC 31.2 32.3  RDW 13.9 13.8  PLT 276 255   Thyroid No results for input(s): "TSH", "FREET4" in the last 168 hours.  BNP Recent Labs  Lab 02/19/23 1425  BNP 41.4    DDimer No results for input(s): "DDIMER" in the last 168 hours.   Radiology    ECHOCARDIOGRAM COMPLETE Result Date: 02/20/2023 IMPRESSIONS  1. Left ventricular ejection fraction, by estimation, is 60 to 65%. The left ventricle has normal function. The left ventricle has no regional wall motion abnormalities. Left ventricular diastolic parameters are consistent with Grade I diastolic dysfunction (impaired relaxation).  2. Right ventricular systolic function is normal. The right ventricular size is normal.  3. The mitral valve is normal in structure. No evidence of mitral valve regurgitation. No evidence of mitral stenosis.  4. The aortic valve is normal in structure. Aortic valve regurgitation is not visualized. No aortic stenosis is present.  5. There is borderline dilatation of the ascending aorta, measuring 38 mm.  6. The inferior vena cava is normal in size with greater than 50% respiratory variability, suggesting right atrial pressure of 3 mmHg.  Electronically signed by Julien Nordmann MD Signature Date/Time: 02/20/2023/5:08:24 PM    Final    US Abdomen Limited RUQ (LIVER/GB) Result Date: 02/20/2023 IMPRESSION: Unremarkable right upper quadrant ultrasound. Electronically Signed   By: Aram Candela M.D.   On: 02/20/2023 01:17   CT Angio Chest/Abd/Pel for Dissection W and/or W/WO Result Date: 02/19/2023 CLINICAL DATA:  Chest pain, epigastric and flank pain, lower back pain IMPRESSION: Vascular: 1. No evidence of thoracoabdominal aortic aneurysm or dissection. 2. No evidence of pulmonary embolus. 3. Coronary artery atherosclerosis. Nonvascular: 1. Small hiatal hernia. 2. Scattered sigmoid diverticulosis without evidence of diverticulitis. 3. Stable areas of bandlike scarring within the mid and lower lung zones. No acute airspace disease. Electronically Signed   By: Sharlet Salina M.D.   On: 02/19/2023 23:05   DG Chest 2 View Result Date: 02/19/2023 IMPRESSION: No active cardiopulmonary disease. Electronically Signed   By: Jules Schick M.D.   On: 02/19/2023 16:11    Cardiac Studies   02/20/2023 TTE 1. Left ventricular ejection fraction, by estimation, is 60 to 65%. The  left ventricle has normal function. The left ventricle has no regional  wall motion abnormalities. Left ventricular diastolic parameters are  consistent with Grade I diastolic  dysfunction (impaired relaxation).   2. Right ventricular systolic function is normal. The right ventricular  size is normal.   3. The mitral valve is normal in structure. No evidence of mitral valve  regurgitation. No evidence of mitral stenosis.   4. The aortic valve is normal in structure. Aortic valve regurgitation is  not visualized. No aortic stenosis is present.   5. There is borderline dilatation of the ascending aorta, measuring 38  mm.   6. The inferior vena cava is normal in size with greater than 50%  respiratory variability, suggesting right atrial pressure of 3 mmHg.    10/2021 Myoview Lexiscan   Findings are consistent with no ischemia. The study is low risk.   No ST deviation was noted.   The left ventricular ejection fraction is hyperdynamic (>65%).   LAD and LCx coronary calcifications/stent noted.  LHC 10/18/2014 ([facility]): Left main normal, LAD patent stent with 30% stenosis distal followed by 50% stenosis at the level of D2 (both stenoses were 10% increase  from prior), distal LAD 20% stenosis, LCx normal, RCA dominant large vessel with mid and distal 30% stenosis Final results :  1.  Clinical : Recurrent chest pains etiology undetermined.  2.  Anatomic : Normal LAD and marginal circumflex stents.  Mild  progression of atherosclerosis about 10% at all focal previous stenosis.  Mid LAD has 50% stenosis.  Negative FFR for hemodynamic significance.    Other stenosis including 30% mid and distal right coronary artery and 30%  followed by 50% mid LAD stenosis.  Ejection fraction improved 55-60%.   Patient Profile     Brenda Craig is a 64 y.o. female with a hx of CAD with STEMI complicated by Vfib arrest in 05/2011 s/p PCI to the Lcx with unstable angina in 03/2012 s/p PCI to the LAD, recurrent chest pain, celiac disease, iron ef anemia, HTN, HLD, nephrolithiasis, and OSA who is being seen for the further evaluation of chest pain.   Assessment & Plan    Chest pain CAD s/p PCI LAD and Lcx - Presented 2/11 with 10/10 chest pain consistent with prior anginal symptoms - Troponin negative - No acute changes on EKG - Echo showed LVEF 60-65% without RWMA, and GIDD - Given cardiac history and that patient is poor candidate for Myoview due to body habitus, decision was made to proceed with cardiac catheterization - Tentatively scheduled for 2/12, however patient did not received pre-medication for contrast reaction. Procedure was rescheduled and patient has received prednisone and Benadryl.  - Now tentatively scheduled for LHC today 12/13 - Continue  ASA 81 mg, losartan 25 mg daily, nebivolol 5 mg daily, and rosuvastatin 10 mg daily  Informed Consent   Shared Decision Making/Informed Consent The risks [stroke (1 in 1000), death (1 in 1000), kidney failure [usually temporary] (1 in 500), bleeding (1 in 200), allergic reaction [possibly serious] (1 in 200)], benefits (diagnostic support and management of coronary artery disease) and alternatives of a cardiac catheterization were discussed in detail with Ms. Meskill and she is willing to proceed.    Abdominal pain/discomfort - RUQ US unremarkable - Pain improving - Management per IM  Dyslipidemia - LDL 56, continue rosuvastatin as above  Hypertension - BP stable, continue PTA medications as above  Asthma, chronic Acute respiratory failure with hypoxia - No longer requiring supplemental O2 - No concern for acute asthma exacerbation - Management per IM  For questions or updates, please contact [facility] Please consult [email or web address] for contact info under      Signed, Orion Crook, PA-C  02/21/2023, 8:20 AM

## 2023-02-22 ENCOUNTER — Encounter: Payer: Self-pay | Admitting: Cardiology

## 2023-02-22 DIAGNOSIS — R079 Chest pain, unspecified: Secondary | ICD-10-CM | POA: Diagnosis not present

## 2023-02-22 NOTE — Plan of Care (Signed)
Problem: Clinical Measurements: Goal: Ability to maintain clinical measurements within normal limits will improve Outcome: Progressing Goal: Will remain free from infection Outcome: Progressing Goal: Respiratory complications will improve Outcome: Progressing Goal: Cardiovascular complication will be avoided Outcome: Progressing   Problem: Activity: Goal: Risk for activity intolerance will decrease Outcome: Progressing

## 2023-02-22 NOTE — Discharge Summary (Signed)
Physician Discharge Summary   Brenda Craig  female DOB: 1960/09/07  EAV:[id number]  PCP: Jaclyn Shaggy, MD  Admit date: 02/19/2023 Discharge date: 02/22/2023  Admitted From: home Disposition:  home CODE STATUS: Full code  Discharge Instructions     Diet - low sodium heart healthy   Complete by: As directed       Hospital Course:  For full details, please see H&P, progress notes, consult notes and ancillary notes.  Briefly,  Brenda Craig is a 63 y.o. Caucasian female with medical history significant for asthma, celiac disease, CHF, OSA, CKD, CAD with STEMI complicated by Vfib arrest in 05/2011 s/p PCI to the Lcx with unstable angina in 03/2012 s/p PCI to the LAD who presented to the emergency room with acute onset of left-sided chest pain felt as pressure and tightness as well as upper abdominal pain extending like a band involving her epigastric left upper quadrant and more in the right upper quadrant.    * Chest pain, atypical CAD with STEMI complicated by Vfib arrest in 05/2011 s/p PCI to the Lcx with unstable angina in 03/2012 s/p PCI to the LAD Patient has atypical chest versus epigastric pain.  CTA chest, abdomen and pelvis was negative for PE, any aneurysm or dissection.  No other significant abnormality noted. --Based on her cardiac history, pt received cardiac cath on 02/21/23 which was without any new stenosis and patent PCI.   -Continue home aspirin, losartan and Crestor   Asthma, chronic, unspecified asthma severity No concern of exacerbation at this time. On room air.   Acute respiratory failure with hypoxia, ruled out In the ED, pt had mild hypoxia at 88% on room air requiring supplemental oxygen.  Pt had no respiratory distress.  Likely night-time hypoxia due to sleep apnea.  Chest CTA showed no specific reasons for her hypoxia and ruled out PE. --prior to discharge, pt was sating 90's with walking.  Right upper quadrant abdominal pain RUQ ultrasound  unremarkable. Pain improved   Dyslipidemia - continue statin therapy.   GERD without esophagitis - continue PPI therapy and H2 blocker therapy.   Essential hypertension --cont losartan and nebivolol   Anxiety --cont home Ativan   Unless noted above, medications under "STOP" list are ones pt was not taking PTA.  Discharge Diagnoses:  Principal Problem:   Chest pain Active Problems:   Asthma, chronic, unspecified asthma severity, with acute exacerbation   Acute respiratory failure with hypoxia (HCC)   Right upper quadrant abdominal pain   Unstable angina (HCC)   Anxiety   Coronary artery disease of native artery of native heart with stable angina pectoris (HCC)   Essential hypertension   GERD without esophagitis   Dyslipidemia   Epigastric pain   Hypoxia     Discharge Instructions:  Allergies as of 02/22/2023       Reactions   Other Other (See Comments)   Gluten allergy Celiac    Codeine Nausea And Vomiting, Rash   Esomeprazole Magnesium Palpitations, Other (See Comments)   IRREGULAR HEARTBEAT   Iodinated Contrast Media Itching   Omeprazole Magnesium Palpitations, Other (See Comments)   IRREGULAR HEARTBEAT   Percocet [oxycodone-acetaminophen] Itching, Nausea And Vomiting, Rash   Can take with benadryl    Tramadol Rash   Can take with benadryl         Medication List     STOP taking these medications    amoxicillin 500 MG capsule Commonly known as: AMOXIL   azelastine 0.1 %  nasal spray Commonly known as: ASTELIN   benzonatate 200 MG capsule Commonly known as: TESSALON   triamcinolone 55 MCG/ACT Aero nasal inhaler Commonly known as: NASACORT   TUSSIDEX PO       TAKE these medications    acetaminophen 500 MG tablet Commonly known as: TYLENOL Take 1,000 mg by mouth as needed for moderate pain or headache.   albuterol 108 (90 Base) MCG/ACT inhaler Commonly known as: VENTOLIN HFA Inhale 2 puffs into the lungs as needed for wheezing or  shortness of breath.   albuterol (2.5 MG/3ML) 0.083% nebulizer solution Commonly known as: PROVENTIL Take 3 mLs (2.5 mg total) by nebulization every 4 (four) hours as needed for wheezing or shortness of breath (coughing fits).   aspirin EC 81 MG tablet Take 81 mg by mouth daily. Swallow whole.   Benadryl Allergy 25 MG tablet Generic drug: diphenhydrAMINE Take 25 mg by mouth as needed for itching.   cetirizine 10 MG tablet Commonly known as: ZYRTEC TAKE 1 TABLET BY MOUTH EVERYDAY AT BEDTIME   chlorpheniramine-HYDROcodone 10-8 MG/5ML Commonly known as: TUSSIONEX Take 5 mLs by mouth at bedtime as needed for cough.   clobetasol ointment 0.05 % Commonly known as: TEMOVATE Apply to affected area every night for 4 weeks, then every other day for 4 weeks and then 1-2 times a week for maintenance   dicyclomine 10 MG capsule Commonly known as: BENTYL Take 10 mg by mouth as needed for spasms.   famotidine 40 MG tablet Commonly known as: PEPCID Take 40 mg by mouth at bedtime.   gabapentin 100 MG capsule Commonly known as: Neurontin Take 1 capsule (100 mg total) by mouth 3 (three) times daily.   LORazepam 1 MG tablet Commonly known as: ATIVAN Take 1 mg by mouth 2 (two) times daily.   losartan 25 MG tablet Commonly known as: COZAAR TAKE 1 TABLET (25 MG TOTAL) BY MOUTH DAILY.   Melatonin 5 MG Caps Take 5 mg by mouth at bedtime.   montelukast 10 MG tablet Commonly known as: SINGULAIR Take 10 mg by mouth daily.   nebivolol 5 MG tablet Commonly known as: BYSTOLIC TAKE 1 TABLET (5 MG TOTAL) BY MOUTH DAILY WITH SUPPER.   nitroGLYCERIN 0.4 MG SL tablet Commonly known as: Nitrostat Place 1 tablet (0.4 mg total) under the tongue every 5 (five) minutes as needed for chest pain.   nystatin cream Commonly known as: MYCOSTATIN Apply 1 Application topically as needed (rash).   pantoprazole 20 MG tablet Commonly known as: PROTONIX Take 40 mg by mouth daily.   rosuvastatin 10 MG  tablet Commonly known as: CRESTOR Take 10 mg by mouth at bedtime.   triamcinolone cream 0.1 % Commonly known as: KENALOG Apply 1 Application topically as needed (break out).         Follow-up Information     Jaclyn Shaggy, MD Follow up in 1 week(s).   Specialty: Internal Medicine Contact information: [address]   [place] [address] [phone]                 Allergies  Allergen Reactions   Other Other (See Comments)    Gluten allergy Celiac    Codeine Nausea And Vomiting and Rash   Esomeprazole Magnesium Palpitations and Other (See Comments)    IRREGULAR HEARTBEAT   Iodinated Contrast Media Itching   Omeprazole Magnesium Palpitations and Other (See Comments)    IRREGULAR HEARTBEAT   Percocet [Oxycodone-Acetaminophen] Itching, Nausea And Vomiting and Rash  Can take with benadryl    Tramadol Rash    Can take with benadryl      The results of significant diagnostics from this hospitalization (including imaging, microbiology, ancillary and laboratory) are listed below for reference.   Consultations:   Procedures/Studies: CARDIAC CATHETERIZATION Result Date: 02/21/2023 Images from the original result were not included.   Previously placed Prox LAD to Mid LAD stent of unknown type is  widely patent.  Non-stenotic 1st Diag lesion was previously treated.   Previously placed 1st Mrg stent of unknown type is  widely patent.   LV end diastolic pressure is normal.   There is no aortic valve stenosis.   In the absence of any other complications or medical issues, we expect the patient to be ready for discharge from a cath perspective on 02/21/2023. Diagnostic: Dominance: Right RECOMMENDATIONS  In the absence of any other complications or medical issues, we expect the patient to be ready for discharge from a cath perspective on 02/21/2023. Bryan Lemma, MD  ECHOCARDIOGRAM COMPLETE Result Date: 02/20/2023    ECHOCARDIOGRAM REPORT   Patient Name:   Brenda Craig Date of Exam: 02/20/2023 Medical Rec #:  [id number]          Height:       65.0 in Accession #:    [id number]         Weight:       225.0 lb Date of Birth:  04-29-1960         BSA:          2.080 m Patient Age:    62 years           BP:           144/75 mmHg Patient Gender: F                  HR:           68 bpm. Exam Location:  [facility] Procedure: 2D Echo, Intracardiac Opacification Agent, Cardiac Doppler and Color            Doppler (Both Spectral and Color Flow Doppler were utilized during            procedure). Indications:     CAD  History:         Patient has prior history of Echocardiogram examinations, most                  recent 08/23/2015. CAD and Previous Myocardial Infarction,                  Signs/Symptoms:Chest Pain; Risk Factors:Sleep Apnea,                  Non-Smoker, Hypertension and Dyslipidemia.  Sonographer:     Dondra Prader RVT RCS Referring Phys:  [id number] Francee Nodal FURTH Diagnosing Phys: Julien Nordmann MD IMPRESSIONS  1. Left ventricular ejection fraction, by estimation, is 60 to 65%. The left ventricle has normal function. The left ventricle has no regional wall motion abnormalities. Left ventricular diastolic parameters are consistent with Grade I diastolic dysfunction (impaired relaxation).  2. Right ventricular systolic function is normal. The right ventricular size is normal.  3. The mitral valve is normal in structure. No evidence of mitral valve regurgitation. No evidence of mitral stenosis.  4. The aortic valve is normal in structure. Aortic valve regurgitation is not visualized. No aortic stenosis is present.  5. There is borderline dilatation of the ascending aorta, measuring 38  mm.  6. The inferior vena cava is normal in size with greater than 50% respiratory variability, suggesting right atrial pressure of 3 mmHg. FINDINGS  Left Ventricle: Left ventricular ejection fraction, by estimation, is 60 to 65%. The left ventricle has normal function. The left ventricle has no  regional wall motion abnormalities. Definity contrast agent was given IV to delineate the left ventricular  endocardial borders. Strain imaging was not performed. The left ventricular internal cavity size was normal in size. There is no left ventricular hypertrophy. Left ventricular diastolic parameters are consistent with Grade I diastolic dysfunction (impaired relaxation). Right Ventricle: The right ventricular size is normal. No increase in right ventricular wall thickness. Right ventricular systolic function is normal. Left Atrium: Left atrial size was normal in size. Right Atrium: Right atrial size was normal in size. Pericardium: There is no evidence of pericardial effusion. Mitral Valve: The mitral valve is normal in structure. No evidence of mitral valve regurgitation. No evidence of mitral valve stenosis. Tricuspid Valve: The tricuspid valve is normal in structure. Tricuspid valve regurgitation is not demonstrated. No evidence of tricuspid stenosis. Aortic Valve: The aortic valve is normal in structure. Aortic valve regurgitation is not visualized. No aortic stenosis is present. Aortic valve mean gradient measures 3.5 mmHg. Aortic valve peak gradient measures 6.4 mmHg. Aortic valve area, by VTI measures 2.78 cm. Pulmonic Valve: The pulmonic valve was normal in structure. Pulmonic valve regurgitation is not visualized. No evidence of pulmonic stenosis. Aorta: The aortic root is normal in size and structure. There is borderline dilatation of the ascending aorta, measuring 38 mm. Venous: The inferior vena cava is normal in size with greater than 50% respiratory variability, suggesting right atrial pressure of 3 mmHg. IAS/Shunts: No atrial level shunt detected by color flow Doppler. Additional Comments: 3D imaging was not performed.  LEFT VENTRICLE PLAX 2D LVIDd:         4.60 cm   Diastology LVIDs:         3.20 cm   LV e' medial:    7.40 cm/s LV PW:         0.80 cm   LV E/e' medial:  10.6 LV IVS:        1.10  cm   LV e' lateral:   10.10 cm/s LVOT diam:     2.10 cm   LV E/e' lateral: 7.8 LV SV:         77 LV SV Index:   37 LVOT Area:     3.46 cm  RIGHT VENTRICLE             IVC RV S prime:     12.60 cm/s  IVC diam: 2.10 cm TAPSE (M-mode): 2.3 cm LEFT ATRIUM             Index LA diam:        3.90 cm 1.88 cm/m LA Vol (A2C):   43.5 ml 20.92 ml/m LA Vol (A4C):   44.7 ml 21.49 ml/m LA Biplane Vol: 46.8 ml 22.50 ml/m  AORTIC VALVE                    PULMONIC VALVE AV Area (Vmax):    2.82 cm     PV Vmax:       0.67 m/s AV Area (Vmean):   2.72 cm     PV Peak grad:  1.8 mmHg AV Area (VTI):     2.78 cm AV Vmax:  126.50 cm/s AV Vmean:          85.000 cm/s AV VTI:            0.277 m AV Peak Grad:      6.4 mmHg AV Mean Grad:      3.5 mmHg LVOT Vmax:         103.00 cm/s LVOT Vmean:        66.800 cm/s LVOT VTI:          0.222 m LVOT/AV VTI ratio: 0.80  AORTA Ao Root diam: 3.50 cm Ao Asc diam:  3.80 cm MITRAL VALVE MV Area (PHT): 3.91 cm    SHUNTS MV Decel Time: 194 msec    Systemic VTI:  0.22 m MV E velocity: 78.60 cm/s  Systemic Diam: 2.10 cm MV A velocity: 79.10 cm/s MV E/A ratio:  0.99 Julien Nordmann MD Electronically signed by Julien Nordmann MD Signature Date/Time: 02/20/2023/5:08:24 PM    Final    US Abdomen Limited RUQ (LIVER/GB) Result Date: 02/20/2023 CLINICAL DATA:  Abdominal pain. EXAM: ULTRASOUND ABDOMEN LIMITED RIGHT UPPER QUADRANT COMPARISON:  January 03, 2021 FINDINGS: Gallbladder: No gallstones or wall thickening visualized (2.9 mm). No sonographic Murphy sign noted by sonographer. Common bile duct: Diameter: 1.8 mm Liver: No focal lesion identified. Within normal limits in parenchymal echogenicity. Portal vein is patent on color Doppler imaging with normal direction of blood flow towards the liver. Other: None. IMPRESSION: Unremarkable right upper quadrant ultrasound. Electronically Signed   By: Aram Candela M.D.   On: 02/20/2023 01:17   CT Angio Chest/Abd/Pel for Dissection W and/or  W/WO Result Date: 02/19/2023 CLINICAL DATA:  Chest pain, epigastric and flank pain, lower back pain EXAM: CT ANGIOGRAPHY CHEST, ABDOMEN AND PELVIS TECHNIQUE: Non-contrast CT of the chest was initially obtained. Multidetector CT imaging through the chest, abdomen and pelvis was performed using the standard protocol during bolus administration of intravenous contrast. Multiplanar reconstructed images and MIPs were obtained and reviewed to evaluate the vascular anatomy. RADIATION DOSE REDUCTION: This exam was performed according to the departmental dose-optimization program which includes automated exposure control, adjustment of the mA and/or kV according to patient size and/or use of iterative reconstruction technique. CONTRAST:  OMNIPAQUE IOHEXOL 350 MG/ML SOLN COMPARISON:  02/19/2023, 02/01/2022 FINDINGS: CTA CHEST FINDINGS Cardiovascular: No evidence of thoracic aortic aneurysm or dissection. There is technically adequate opacification of the pulmonary vasculature. No filling defects or pulmonary emboli. The heart is unremarkable without pericardial effusion. Extensive atherosclerosis of the coronary vasculature. Mediastinum/Nodes: No enlarged mediastinal, hilar, or axillary lymph nodes. Thyroid gland, trachea, and esophagus demonstrate no significant findings. Small hiatal hernia. Lungs/Pleura: Band like areas of scarring are again seen within the right middle lobe, lingula, bilateral lower lobes. No acute airspace disease, effusion, or pneumothorax. Mild bibasilar bronchiectasis. No bronchial wall thickening. Central airways are patent. Musculoskeletal: No acute or destructive bony abnormalities. Reconstructed images demonstrate no additional findings. Review of the MIP images confirms the above findings. CTA ABDOMEN AND PELVIS FINDINGS VASCULAR Aorta: Normal caliber aorta without aneurysm, dissection, vasculitis or significant stenosis. Celiac: Patent without evidence of aneurysm, dissection, vasculitis  or significant stenosis. SMA: Patent without evidence of aneurysm, dissection, vasculitis or significant stenosis. Replaced right hepatic artery off the SMA, and anatomic variant. Renals: Both renal arteries are patent without evidence of aneurysm, dissection, vasculitis, fibromuscular dysplasia or significant stenosis. IMA: Patent without evidence of aneurysm, dissection, vasculitis or significant stenosis. Inflow: Patent without evidence of aneurysm, dissection, vasculitis or significant stenosis. Veins: No obvious  venous abnormality within the limitations of this arterial phase study. Review of the MIP images confirms the above findings. NON-VASCULAR Hepatobiliary: No focal liver abnormality is seen. No gallstones, gallbladder wall thickening, or biliary dilatation. Pancreas: Unremarkable. No pancreatic ductal dilatation or surrounding inflammatory changes. Spleen: Normal in size without focal abnormality. Adrenals/Urinary Tract: Multiple right renal cysts do not require specific imaging follow-up. Otherwise the kidneys enhance normally. No urinary tract calculi or obstructive uropathy within either kidney. The adrenals and bladder are unremarkable. Stomach/Bowel: No bowel obstruction or ileus. Normal appendix right lower quadrant. Scattered diverticulosis of the sigmoid colon without evidence of acute diverticulitis. No bowel wall thickening or inflammatory change. Small hiatal hernia. Lymphatic: No pathologic adenopathy. Reproductive: Uterus and bilateral adnexa are unremarkable. Other: No free fluid or free intraperitoneal gas. No abdominal wall hernia. Musculoskeletal: No acute or destructive bony abnormalities. Reconstructed images demonstrate no additional findings. Review of the MIP images confirms the above findings. IMPRESSION: Vascular: 1. No evidence of thoracoabdominal aortic aneurysm or dissection. 2. No evidence of pulmonary embolus. 3. Coronary artery atherosclerosis. Nonvascular: 1. Small hiatal  hernia. 2. Scattered sigmoid diverticulosis without evidence of diverticulitis. 3. Stable areas of bandlike scarring within the mid and lower lung zones. No acute airspace disease. Electronically Signed   By: Sharlet Salina M.D.   On: 02/19/2023 23:05   DG Chest 2 View Result Date: 02/19/2023 CLINICAL DATA:  Chest pain. EXAM: CHEST - 2 VIEW COMPARISON:  10/09/2022. FINDINGS: Linear area of scarring/atelectasis noted in the middle lobe. There are additional linear areas of scarring/atelectasis at the lung bases. Bilateral lung fields are otherwise clear. No acute consolidation or lung collapse. Bilateral costophrenic angles are clear. Stable cardio-mediastinal silhouette. No acute osseous abnormalities. The soft tissues are within normal limits. IMPRESSION: No active cardiopulmonary disease. Electronically Signed   By: Jules Schick M.D.   On: 02/19/2023 16:11      Labs: BNP (last 3 results) Recent Labs    02/19/23 1425  BNP 41.4   Basic Metabolic Panel: Recent Labs  Lab 02/19/23 1425 02/20/23 0708  NA 135 137  K 3.5 4.7  CL 101 106  CO2 23 24  GLUCOSE 134* 154*  BUN 20 15  CREATININE 1.06* 0.88  CALCIUM 8.6* 8.9   Liver Function Tests: Recent Labs  Lab 02/19/23 1710  AST 24  ALT 24  ALKPHOS 161*  BILITOT 0.6  PROT 6.9  ALBUMIN 3.8   Recent Labs  Lab 02/19/23 1710  LIPASE 29   No results for input(s): "AMMONIA" in the last 168 hours. CBC: Recent Labs  Lab 02/19/23 1425 02/20/23 0708  WBC 7.6 7.8  HGB 13.4 13.1  HCT 42.9 40.6  MCV 90.3 87.3  PLT 276 255   Cardiac Enzymes: No results for input(s): "CKTOTAL", "CKMB", "CKMBINDEX", "TROPONINI" in the last 168 hours. BNP: Invalid input(s): "POCBNP" CBG: No results for input(s): "GLUCAP" in the last 168 hours. D-Dimer No results for input(s): "DDIMER" in the last 72 hours. Hgb A1c No results for input(s): "HGBA1C" in the last 72 hours. Lipid Profile Recent Labs    02/20/23 0708 02/21/23 0555  CHOL 141  128  HDL 58 57  LDLCALC 71 56  TRIG 60 74  CHOLHDL 2.4 2.2   Thyroid function studies No results for input(s): "TSH", "T4TOTAL", "T3FREE", "THYROIDAB" in the last 72 hours.  Invalid input(s): "FREET3" Anemia work up No results for input(s): "VITAMINB12", "FOLATE", "FERRITIN", "TIBC", "IRON", "RETICCTPCT" in the last 72 hours. Urinalysis    Component Value  Date/Time   COLORURINE STRAW (A) 02/19/2023 1845   APPEARANCEUR CLEAR (A) 02/19/2023 1845   APPEARANCEUR Clear 05/08/2021 0945   LABSPEC 1.008 02/19/2023 1845   PHURINE 5.0 02/19/2023 1845   GLUCOSEU NEGATIVE 02/19/2023 1845   HGBUR SMALL (A) 02/19/2023 1845   BILIRUBINUR NEGATIVE 02/19/2023 1845   BILIRUBINUR neg 08/03/2021 1410   BILIRUBINUR Negative 05/08/2021 0945   KETONESUR NEGATIVE 02/19/2023 1845   PROTEINUR NEGATIVE 02/19/2023 1845   NITRITE NEGATIVE 02/19/2023 1845   LEUKOCYTESUR NEGATIVE 02/19/2023 1845   Sepsis Labs Recent Labs  Lab 02/19/23 1425 02/20/23 0708  WBC 7.6 7.8   Microbiology Recent Results (from the past 240 hours)  Resp panel by RT-PCR (RSV, Flu A&B, Covid) Anterior Nasal Swab     Status: None   Collection Time: 02/19/23 11:28 PM   Specimen: Anterior Nasal Swab  Result Value Ref Range Status   SARS Coronavirus 2 by RT PCR NEGATIVE NEGATIVE Final    Comment: (NOTE) SARS-CoV-2 target nucleic acids are NOT DETECTED.  The SARS-CoV-2 RNA is generally detectable in upper respiratory specimens during the acute phase of infection. The lowest concentration of SARS-CoV-2 viral copies this assay can detect is 138 copies/mL. A negative result does not preclude SARS-Cov-2 infection and should not be used as the sole basis for treatment or other patient management decisions. A negative result may occur with  improper specimen collection/handling, submission of specimen other than nasopharyngeal swab, presence of viral mutation(s) within the areas targeted by this assay, and inadequate number of  viral copies(<138 copies/mL). A negative result must be combined with clinical observations, patient history, and epidemiological information. The expected result is Negative.  Fact Sheet for Patients:  BloggerCourse.com  Fact Sheet for Healthcare Providers:  SeriousBroker.it  This test is no t yet approved or cleared by the [place] FDA and  has been authorized for detection and/or diagnosis of SARS-CoV-2 by FDA under an Emergency Use Authorization (EUA). This EUA will remain  in effect (meaning this test can be used) for the duration of the COVID-19 declaration under Section 564(b)(1) of the Act, 21 U.S.C.section 360bbb-3(b)(1), unless the authorization is terminated  or revoked sooner.       Influenza A by PCR NEGATIVE NEGATIVE Final   Influenza B by PCR NEGATIVE NEGATIVE Final    Comment: (NOTE) The Xpert Xpress SARS-CoV-2/FLU/RSV plus assay is intended as an aid in the diagnosis of influenza from Nasopharyngeal swab specimens and should not be used as a sole basis for treatment. Nasal washings and aspirates are unacceptable for Xpert Xpress SARS-CoV-2/FLU/RSV testing.  Fact Sheet for Patients: BloggerCourse.com  Fact Sheet for Healthcare Providers: SeriousBroker.it  This test is not yet approved or cleared by the [place] FDA and has been authorized for detection and/or diagnosis of SARS-CoV-2 by FDA under an Emergency Use Authorization (EUA). This EUA will remain in effect (meaning this test can be used) for the duration of the COVID-19 declaration under Section 564(b)(1) of the Act, 21 U.S.C. section 360bbb-3(b)(1), unless the authorization is terminated or revoked.     Resp Syncytial Virus by PCR NEGATIVE NEGATIVE Final    Comment: (NOTE) Fact Sheet for Patients: BloggerCourse.com  Fact Sheet for Healthcare  Providers: SeriousBroker.it  This test is not yet approved or cleared by the [place] FDA and has been authorized for detection and/or diagnosis of SARS-CoV-2 by FDA under an Emergency Use Authorization (EUA). This EUA will remain in effect (meaning this test can be used) for the duration of the  COVID-19 declaration under Section 564(b)(1) of the Act, 21 U.S.C. section 360bbb-3(b)(1), unless the authorization is terminated or revoked.  Performed at [facility], [address] [place], [place] [address]      Total time spend on discharging this patient, including the last patient exam, discussing the hospital stay, instructions for ongoing care as it relates to all pertinent caregivers, as well as preparing the medical discharge records, prescriptions, and/or referrals as applicable, is 40 minutes.    Darlin Priestly, MD  [facility] 02/22/2023, 10:03 AM

## 2023-02-26 LAB — LIPOPROTEIN A (LPA): Lipoprotein (a): 41.1 nmol/L — ABNORMAL HIGH (ref <75.0)

## 2023-03-05 ENCOUNTER — Encounter: Payer: Self-pay | Admitting: *Deleted

## 2023-03-07 NOTE — Progress Notes (Unsigned)
 Cardiology Office Note  Date:  03/08/2023   ID:  BRISEYDA FEHR, DOB 09/11/1960, MRN [id number]  PCP:  Brenda Shaggy, MD   Chief Complaint  Patient presents with   Follow-up    Denies cardiac symptoms.    HPI:  [name] is a 63 year old woman with past medical history of Back pain (anginal equiv) Coronary artery disease ---ST elevation myocardial infarction complicated by ventricular fibrillation requiring circumflex stent May 2013.  ---LAD stenting March 2014.   ---Catheterization 10/18/2014 showed patent stents with 50% mid LAD stenosis with negative FFR.  Celiac disease Who presents for follow-up of her coronary disease  Last seen by myself in clinic december 2023   in hospital  2/25, records reviewed Had chest pain, out of concern for angina had cardiac catheterization Cath detailing patent stents no other significant obstructive disease Other cardiac studies reviewed Echo normal study , EF65%  In follow-up reports that she feels well Wonders why she gets occasional chest pain every once in a while  Trouble with Celiac disease,  Symptoms including h/a, vomiting, fatigue/tired Followed by GI No fast food, Gluten free foods seem to help  No significant lower extremity edema Denies shortness of breath  Chronic Knee pain, prior knee surgery  Lab work reviewed [place] chol 128 LDL 56  Other cardiac imaging stress test October 2023   Findings are consistent with no ischemia. The study is low risk.  EKG personally reviewed by myself on todays visit EKG Interpretation Date/Time:  Friday March 08 2023 10:11:46 EST Ventricular Rate:  70 PR Interval:  128 QRS Duration:  80 QT Interval:  412 QTC Calculation: 444 R Axis:   -6  Text Interpretation: Normal sinus rhythm Normal ECG When compared with ECG of 19-Feb-2023 14:21, No significant change was found Confirmed by Brenda Craig [phone]) on 03/08/2023 10:36:45 AM    Prior cardiac history as  below Chest pain November 22 while at [facility], seen in the hospital Stress test at that time, a low risk study  December 2017 Cardiolite scan negative for ischemia.  Ejection fraction 75%.   -She has had no recent angina with no use of nitroglycerin in the last year.   urosepsis and developed Covid infection during the hospitalization. She has fully recovered and also has received both doses of the COVID-19 vaccine   PMH:   has a past medical history of Abnormal Pap smear of cervix (1988), Acid reflux (01/17/2013), Allergic rhinitis, Anemia, Anginal pain (HCC), Anxiety, Arthritis, Asthma, Bronchiectasis (HCC), Celiac disease, Cervical dysplasia (1988), CHF (congestive heart failure) (HCC), Chronic kidney disease, Complication of anesthesia, Coronary artery disease, Headache, Heart trouble, History of cardiac arrest, History of hiatal hernia, History of kidney stones, History of mammogram (03/25/2013; 12/07/14), History of Papanicolaou smear of cervix (03/20/12; 12/28/15), Hypercholesteremia, Hypertension, IBS (irritable bowel syndrome), Menopausal symptoms, MI (myocardial infarction) (HCC), Mitral valve prolapse, Pneumonia, PONV (postoperative nausea and vomiting), Sleep apnea, and Vulvovaginitis.  PSH:    Past Surgical History:  Procedure Laterality Date   ABLATION  2013   [facility]   BACK SURGERY     BACK SURGERY  2016; 2017   L4, L5   CARDIAC SURGERY  03/19/12; 10/2014   HEART CATH AND STENT   CESAREAN SECTION     1986; 1989   COLD KNIFE CONE BIOPSY  1988   COLONOSCOPY  08/2010   16 POLYPS (ALL BENIGN) DX C CELIAC DISEASE   COLONOSCOPY N/A 02/17/2021   Procedure: COLONOSCOPY;  Surgeon: Brenda Craig  T, MD;  Location: [facility] ENDOSCOPY;  Service: Endoscopy;  Laterality: N/A;   COMBINED HYSTEROSCOPY DIAGNOSTIC / D&C  2013   [facility]   CYSTOSCOPY W/ RETROGRADES Left 01/20/2019   Procedure: CYSTOSCOPY WITH RETROGRADE PYELOGRAM;  Surgeon: Brenda Altes, MD;  Location: [facility] ORS;  Service:  Urology;  Laterality: Left;   CYSTOSCOPY W/ URETERAL STENT PLACEMENT Left 12/13/2018   Procedure: CYSTOSCOPY WITH RETROGRADE PYELOGRAM/URETERAL STENT PLACEMENT;  Surgeon: Brenda Pippin, MD;  Location: [facility] ORS;  Service: Urology;  Laterality: Left;   CYSTOSCOPY/URETEROSCOPY/HOLMIUM LASER/STENT PLACEMENT Left 01/20/2019   Procedure: CYSTOSCOPY/URETEROSCOPY/HOLMIUM LASER/STENT EXCHANGE;  Surgeon: Brenda Altes, MD;  Location: [facility] ORS;  Service: Urology;  Laterality: Left;   DILATION AND CURETTAGE OF UTERUS  2001   ESOPHAGOGASTRODUODENOSCOPY  08/2010   DX C CELIAC DISEASE   ESOPHAGOGASTRODUODENOSCOPY N/A 02/17/2021   Procedure: ESOPHAGOGASTRODUODENOSCOPY (EGD);  Surgeon: Brenda Bill, MD;  Location: [facility] END[facility]COPY;  Service: Endoscopy;  Laterality: N/A;   HEART STENTS  05/27/2011   HIP SURGERY  2016   RIGHT IT BAND AND BURSA REMOVAL   LEFT HEART CATH AND CORONARY ANGIOGRAPHY N/A 02/21/2023   Procedure: LEFT HEART CATH AND CORONARY ANGIOGRAPHY;  Surgeon: Brenda Lex, MD;  Location: [facility] INVASIVE CV LAB;  Service: Cardiovascular;  Laterality: N/A;   OOPHORECTOMY Right 1982   TOTAL KNEE ARTHROPLASTY Left 01/19/2020   Procedure: TOTAL KNEE ARTHROPLASTY;  Surgeon: Brenda Apley, MD;  Location: [facility] ORS;  Service: Orthopedics;  Laterality: Left;   TOTAL KNEE REVISION Left 03/07/2022   Procedure: TOTAL KNEE REVISION;  Surgeon: Brenda Laura, MD;  Location: [facility] ORS;  Service: Orthopedics;  Laterality: Left;    Current Outpatient Medications  Medication Sig Dispense Refill   acetaminophen (TYLENOL) 500 MG tablet Take 1,000 mg by mouth as needed for moderate pain or headache.     albuterol (PROVENTIL) (2.5 MG/3ML) 0.083% nebulizer solution Take 3 mLs (2.5 mg total) by nebulization every 4 (four) hours as needed for wheezing or shortness of breath (coughing fits). 75 mL 1   albuterol (VENTOLIN HFA) 108 (90 Base) MCG/ACT inhaler Inhale 2 puffs into the lungs as needed for wheezing or  shortness of breath.     aspirin EC 81 MG tablet Take 81 mg by mouth daily. Swallow whole.     cetirizine (ZYRTEC) 10 MG tablet TAKE 1 TABLET BY MOUTH EVERYDAY AT BEDTIME 90 tablet 0   clobetasol ointment (TEMOVATE) 0.05 % Apply to affected area every night for 4 weeks, then every other day for 4 weeks and then 1-2 times a week for maintenance 45 g 0   dicyclomine (BENTYL) 10 MG capsule Take 10 mg by mouth as needed for spasms.     diphenhydrAMINE (BENADRYL ALLERGY) 25 MG tablet Take 25 mg by mouth as needed for itching.     famotidine (PEPCID) 40 MG tablet Take 40 mg by mouth at bedtime.     gabapentin (NEURONTIN) 100 MG capsule Take 1 capsule (100 mg total) by mouth 3 (three) times daily. 90 capsule 1   LORazepam (ATIVAN) 1 MG tablet Take 1 mg by mouth 2 (two) times daily.     losartan (COZAAR) 25 MG tablet TAKE 1 TABLET (25 MG TOTAL) BY MOUTH DAILY. 90 tablet 2   Melatonin 5 MG CAPS Take 5 mg by mouth at bedtime.     montelukast (SINGULAIR) 10 MG tablet Take 10 mg by mouth daily.     nebivolol (BYSTOLIC) 5 MG tablet TAKE 1 TABLET (5 MG TOTAL)  BY MOUTH DAILY WITH SUPPER. 90 tablet 0   nitroGLYCERIN (NITROSTAT) 0.4 MG SL tablet Place 1 tablet (0.4 mg total) under the tongue every 5 (five) minutes as needed for chest pain. 25 tablet 3   nystatin cream (MYCOSTATIN) Apply 1 Application topically as needed (rash).     pantoprazole (PROTONIX) 20 MG tablet Take 40 mg by mouth daily.     rosuvastatin (CRESTOR) 10 MG tablet Take 10 mg by mouth at bedtime.     triamcinolone cream (KENALOG) 0.1 % Apply 1 Application topically as needed (break out).     chlorpheniramine-HYDROcodone (TUSSIONEX) 10-8 MG/5ML Take 5 mLs by mouth at bedtime as needed for cough. (Patient not taking: Reported on 03/08/2023)     No current facility-administered medications for this visit.    Allergies:   Other, Codeine, Esomeprazole magnesium, Iodinated contrast media, Omeprazole magnesium, Percocet [oxycodone-acetaminophen],  and Tramadol   Social History:  The patient  reports that she has never smoked. She has never been exposed to tobacco smoke. She has never used smokeless tobacco. She reports that she does not currently use alcohol. She reports that she does not use drugs.   Family History:   family history includes CAD in her father; Cancer in her maternal uncle; Cerebrovascular Accident in her father; Diabetes in her father; Heart disease in her father; Hyperthyroidism in her mother; Hypothyroidism in her father; Transient ischemic attack in her father; Uterine cancer (age of onset: 79) in her maternal aunt.    Review of Systems: Review of Systems  Constitutional: Negative.   HENT: Negative.    Respiratory: Negative.    Cardiovascular: Negative.   Gastrointestinal: Negative.   Musculoskeletal: Negative.   Neurological: Negative.   Psychiatric/Behavioral: Negative.    All other systems reviewed and are negative.   PHYSICAL EXAM: VS:  BP 110/70 (BP Location: Left Arm, Patient Position: Sitting, Cuff Size: Normal)   Pulse 70   Ht 5\' 5"  (1.651 m)   Wt 239 lb 12.8 oz (108.8 kg)   SpO2 93%   BMI 39.90 kg/m  , BMI Body mass index is 39.9 kg/m. Constitutional:  oriented to person, place, and time. No distress.  HENT:  Head: Grossly normal Eyes:  no discharge. No scleral icterus.  Neck: No JVD, no carotid bruits  Cardiovascular: Regular rate and rhythm, no murmurs appreciated Pulmonary/Chest: Clear to auscultation bilaterally, no wheezes or rails Abdominal: Soft.  no distension.  no tenderness.  Musculoskeletal: Normal range of motion Neurological:  normal muscle tone. Coordination normal. No atrophy Skin: Skin warm and dry Psychiatric: normal affect, pleasant  Recent Labs: 02/19/2023: ALT 24; B Natriuretic Peptide 41.4 02/20/2023: BUN 15; Creatinine, Ser 0.88; Hemoglobin 13.1; Platelets 255; Potassium 4.7; Sodium 137    Lipid Panel Lab Results  Component Value Date   CHOL 128 02/21/2023    HDL 57 02/21/2023   LDLCALC 56 02/21/2023   TRIG 74 02/21/2023      Wt Readings from Last 3 Encounters:  03/08/23 239 lb 12.8 oz (108.8 kg)  02/19/23 225 lb (102.1 kg)  10/19/22 233 lb 9.6 oz (106 kg)     ASSESSMENT AND PLAN:  Problem List Items Addressed This Visit       Cardiology Problems   Essential hypertension   Relevant Orders   EKG 12-Lead (Completed)     Other   Chest pain   Relevant Orders   EKG 12-Lead (Completed)   Other Visit Diagnoses       Coronary artery disease involving native  coronary artery of native heart with other form of angina pectoris (HCC)    -  Primary   Relevant Orders   EKG 12-Lead (Completed)     Hyperlipidemia LDL goal <70         Palpitations       Relevant Orders   EKG 12-Lead (Completed)       Coronary disease with stable angina Prior stress test October 2023, low risk study Recent episode of chest pain, evaluated in the hospital Normal echocardiogram ejection fraction 65% with no focal wall motion abnormalities Was set up for cardiac catheterization detailing patent stents, no other obstructive disease reported  Hyperlipidemia Continue Crestor 10, goal LDL less than 55 Cholesterol at goal  Obesity Reports having trouble losing weight We have encouraged continued exercise, careful diet management   Essential hypertension Blood pressure is well controlled on today's visit. No changes made to the medications. Prior side effects of fatigue on metoprolol -Continue low-dose bystolic, losartan  Celiac disease Followed by GI, waxing waning abdominal symptoms Careful with her diet   Signed, Dossie Arbour, M.D., Ph.D. [facility] [place], [place] [phone]

## 2023-03-08 ENCOUNTER — Ambulatory Visit: Payer: 59 | Attending: Cardiovascular Disease | Admitting: Cardiovascular Disease

## 2023-03-08 ENCOUNTER — Encounter: Payer: Self-pay | Admitting: Cardiovascular Disease

## 2023-03-08 VITALS — BP 110/70 | HR 70 | Ht 65.0 in | Wt 239.8 lb

## 2023-03-08 DIAGNOSIS — E785 Hyperlipidemia, unspecified: Secondary | ICD-10-CM

## 2023-03-08 DIAGNOSIS — R072 Precordial pain: Secondary | ICD-10-CM

## 2023-03-08 DIAGNOSIS — I25118 Atherosclerotic heart disease of native coronary artery with other forms of angina pectoris: Secondary | ICD-10-CM | POA: Diagnosis not present

## 2023-03-08 DIAGNOSIS — R002 Palpitations: Secondary | ICD-10-CM | POA: Diagnosis not present

## 2023-03-08 DIAGNOSIS — I10 Essential (primary) hypertension: Secondary | ICD-10-CM

## 2023-03-08 MED ORDER — NITROGLYCERIN 0.4 MG SL SUBL: 0.4000 mg | SUBLINGUAL_TABLET | SUBLINGUAL | 3 refills | Status: AC | PRN

## 2023-03-08 MED ORDER — NEBIVOLOL HCL 5 MG PO TABS: 5.0000 mg | ORAL_TABLET | Freq: Every day | ORAL | 3 refills | Status: DC

## 2023-03-08 MED ORDER — LOSARTAN POTASSIUM 25 MG PO TABS: 25.0000 mg | ORAL_TABLET | Freq: Every day | ORAL | 3 refills | Status: AC

## 2023-03-08 NOTE — Patient Instructions (Signed)

## 2023-04-12 ENCOUNTER — Ambulatory Visit: Admitting: Internal Medicine

## 2023-05-01 ENCOUNTER — Ambulatory Visit: Admitting: Internal Medicine

## 2023-05-01 VITALS — BP 130/90 | HR 75 | Temp 99.0°F | Ht 65.0 in | Wt 235.2 lb

## 2023-05-01 DIAGNOSIS — G4733 Obstructive sleep apnea (adult) (pediatric): Secondary | ICD-10-CM

## 2023-05-01 DIAGNOSIS — J45991 Cough variant asthma: Secondary | ICD-10-CM | POA: Diagnosis not present

## 2023-05-01 DIAGNOSIS — Z6839 Body mass index (BMI) 39.0-39.9, adult: Secondary | ICD-10-CM

## 2023-05-01 DIAGNOSIS — E669 Obesity, unspecified: Secondary | ICD-10-CM | POA: Diagnosis not present

## 2023-05-01 DIAGNOSIS — R5381 Other malaise: Secondary | ICD-10-CM

## 2023-05-01 DIAGNOSIS — K219 Gastro-esophageal reflux disease without esophagitis: Secondary | ICD-10-CM | POA: Diagnosis not present

## 2023-05-01 MED ORDER — TRELEGY ELLIPTA 200-62.5-25 MCG/ACT IN AEPB: 1.0000 | INHALATION_SPRAY | Freq: Every day | RESPIRATORY_TRACT | Status: DC

## 2023-05-01 MED ORDER — TRELEGY ELLIPTA 200-62.5-25 MCG/ACT IN AEPB
1.0000 | INHALATION_SPRAY | Freq: Every day | RESPIRATORY_TRACT | 5 refills | Status: DC
Start: 2023-05-01 — End: 2023-06-27

## 2023-05-01 NOTE — Progress Notes (Signed)
 Open [name]  @[email or web address]  ID: Read Camel, female    DOB: 1960/10/16, 63 y.o.   MRN: [id number]  SYNOPSIS Brenda Craig is referred for evaluation of chronic cough of many years duration. She dates it back to 2009 or 2010. Her cough has been intermittent but has to be worse in the spring autumn and winter. DX with ASTHMA DX of GERD DX of OSA S/p cardiac arrest 2013 AHI of 21  PREVIOUS HISTORY Brenda Craig with severe persistent cough  Brenda Craig has a chronic cough for the last 10 years She has been worked up multiple times at [facility] She has had ENT referrals in the past with extensive work-up   At this time no one can tell her what is causing her cough Brenda Craig is requesting refill on Tussionex I have explained to Brenda Craig we no longer prescribe narcotic-based antitussive  medicines    Exposed to cats and dogs at home She states she is not allergic but would like a referral for assessment   Brenda Craig had normal eosinophil levels of 100 Brenda Craig is not a candidate for immunological or biological therapy    CC  Follow up assessment of OSA Follow up Cough   HPI: 63 year old female, never smoked. PMH significant for OSA, chronic cough, hyperreactive airways, chronic respiratory failure with hypoxia, pneumonia, HTN, CAD, acid reflux, celiac disease, hyperlipidemia.  Brenda Craig presents today for OSA follow-up/ OSA and chronic cough.  Ongoing cough on prednisone  antibiotics at this time I have explained to Brenda Craig I will need to do bronchoscopy  She refuses to have any type of bronchoscopy for airway assessment  At this time Brenda Craig has cough variant asthma Recommend starting Trelegy inhaler therapy at 200  Regarding chronic cough Brenda Craig has significant allergic rhinitis as well as severe reflux disease along with a history of hiatal hernia in the setting of obesity and restrictive lung disease with previous history of COVID infection 3 times in the past several  years    Regarding OSA Brenda Craig uses and benefits from therapy Using CPAP nightly and with naps Pressure setting is comfortable and is sleeping well. Assessment of download shows great compliance With auto CPAP 4-8 AHI is 9.6 will adjust auto CPAP pressures   No exacerbation at this time No evidence of heart failure at this time No evidence or signs of infection at this time No respiratory distress No fevers, chills, nausea, vomiting, diarrhea No evidence of lower extremity edema No evidence hemoptysis    JANUARY 2024 CT CHEST Generally bland appearing, bandlike scarring and or atelectasis of the bilateral lung bases as well as of the anterior right upper lobe. Minimal dependent bibasilar ground-glass without evident interlobular septal thickening or subpleural bronchiolectasis. Minimal, tubular bronchiectasis at the lung bases. Findings are not significantly changed compared to examinations dating back to 05/21/2016. Findings are most consistent with sequelae of prior infection or aspiration without specific evidence of fibrotic interstitial lung disease  CT of the chest reviewed in detail with the Brenda Craig Next step is to perform bronchoscopy however Brenda Craig is unwilling to do this at this time    Allergies  Allergen Reactions   Other Other (See Comments)    Gluten allergy  Celiac    Codeine  Nausea And Vomiting and Rash   Esomeprazole Magnesium  Palpitations and Other (See Comments)    IRREGULAR HEARTBEAT   Iodinated Contrast Media Itching   Omeprazole Magnesium  Palpitations and Other (See Comments)    IRREGULAR HEARTBEAT   Percocet [Oxycodone -Acetaminophen ] Itching, Nausea And Vomiting  and Rash    Can take with benadryl     Tramadol  Rash    Can take with benadryl      Immunization History  Administered Date(s) Administered   Influenza,inj,Quad PF,6+ Mos 05/25/2013, 11/13/2017, 10/17/2018   Influenza-Unspecified 10/10/2020, 10/08/2021   PFIZER Comirnaty(Gray  Top)Covid-19 Tri-Sucrose Vaccine 05/06/2020   PFIZER(Purple Top)SARS-COV-2 Vaccination 04/16/2019, 05/07/2019   Pneumococcal Polysaccharide-23 12/16/2018    Past Medical History:  Diagnosis Date   Abnormal Pap smear of cervix 1988   Acid reflux 01/17/2013   Allergic rhinitis    Anemia    Anginal pain (HCC)    Anxiety    Arthritis    Asthma    Bronchiectasis (HCC)    Celiac disease    Cervical dysplasia 1988   CHF (congestive heart failure) (HCC)    Chronic kidney disease    Complication of anesthesia    bp drops after anesthesia and has to be kept overnight   Coronary artery disease    Headache    migraine with visual aura   Heart trouble    History of cardiac arrest    History of hiatal hernia    History of kidney stones    History of mammogram 03/25/2013; 12/07/14   BIRADS 1; NEG   History of Papanicolaou smear of cervix 03/20/12; 12/28/15   -/-; -/-   Hypercholesteremia    Hypertension    IBS (irritable bowel syndrome)    Menopausal symptoms    MI (myocardial infarction) (HCC)    Mitral valve prolapse    Pneumonia    PONV (postoperative nausea and vomiting)    nausea only   Sleep apnea    Vulvovaginitis    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     albuterol  (PROVENTIL ) (2.5 MG/3ML) 0.083% nebulizer solution Take 3 mLs (2.5 mg total) by nebulization every 4 (four) hours as needed for wheezing or shortness of breath (coughing fits). 75 mL 1   albuterol  (VENTOLIN  HFA) 108 (90 Base) MCG/ACT inhaler Inhale 2 puffs into the lungs as needed for wheezing or shortness of breath.     aspirin  EC 81 MG tablet Take 81 mg by  mouth daily. Swallow whole.     cetirizine  (ZYRTEC ) 10 MG tablet TAKE 1 TABLET BY MOUTH EVERYDAY AT BEDTIME 90 tablet 0   chlorpheniramine-HYDROcodone  (TUSSIONEX) 10-8 MG/5ML Take 5 mLs by mouth at bedtime as needed for cough. (Brenda Craig not taking: Reported on 03/08/2023)     clobetasol  ointment (TEMOVATE ) 0.05 % Apply to affected area every night for 4 weeks, then every other day for 4 weeks and then 1-2 times a week for maintenance 45 g 0   dicyclomine  (BENTYL ) 10 MG capsule Take 10 mg by mouth as needed for spasms.     diphenhydrAMINE  (BENADRYL  ALLERGY ) 25 MG tablet Take 25 mg by mouth as needed for itching.     famotidine  (PEPCID ) 40 MG tablet Take 40 mg by mouth at bedtime.     gabapentin  (NEURONTIN ) 100 MG capsule Take 1 capsule (100 mg total) by mouth 3 (three) times daily. 90 capsule 1   LORazepam  (ATIVAN ) 1 MG tablet Take  1 mg by mouth 2 (two) times daily.     losartan  (COZAAR ) 25 MG tablet Take 1 tablet (25 mg total) by mouth daily. 90 tablet 3   Melatonin 5 MG CAPS Take 5 mg by mouth at bedtime.     montelukast  (SINGULAIR ) 10 MG tablet Take 10 mg by mouth daily.     nebivolol  (BYSTOLIC ) 5 MG tablet Take 1 tablet (5 mg total) by mouth daily with supper. 90 tablet 3   nitroGLYCERIN  (NITROSTAT ) 0.4 MG SL tablet Place 1 tablet (0.4 mg total) under the tongue every 5 (five) minutes as needed for chest pain. 25 tablet 3   nystatin cream (MYCOSTATIN) Apply 1 Application topically as needed (rash).     pantoprazole  (PROTONIX ) 20 MG tablet Take 40 mg by mouth daily.     rosuvastatin  (CRESTOR ) 10 MG tablet Take 10 mg by mouth at bedtime.     triamcinolone  cream (KENALOG ) 0.1 % Apply 1 Application topically as needed (break out).     No facility-administered medications prior to visit.    There were no vitals taken for this visit.   PFTs 2023 Consistent with moderate restrictive disease likely related to obesity  BP (!) 130/90 (BP Location: Left Arm, Brenda Craig Position: Sitting, Cuff Size:  Normal)   Pulse 75   Temp 99 F (37.2 C) (Oral)   Ht 5\' 5"  (1.651 m)   Wt 235 lb 3.2 oz (106.7 kg)   SpO2 93%   BMI 39.14 kg/m   Review of Systems: Gen:  Denies  fever, sweats, chills weight loss  HEENT: Denies blurred vision, double vision, ear pain, eye pain, hearing loss, nose bleeds, sore throat Cardiac:  No dizziness, chest pain or heaviness, chest tightness,edema, No JVD Resp:   + cough, -sputum production, +shortness of breath,+wheezing, -hemoptysis,  Other:  All other systems negative   Physical Examination:   General Appearance: No distress  EYES PERRLA, EOM intact.   NECK Supple, No JVD Pulmonary: normal breath sounds, No wheezing.  CardiovascularNormal S1,S2.  No m/r/g.   Abdomen: Benign, Soft, non-tender. Neurology UE/LE 5/5 strength, no focal deficits Ext pulses intact, cap refill intact ALL OTHER ROS ARE NEGATIVE   Lab Results:  CBC    Component Value Date/Time   WBC 7.8 02/20/2023 0708   RBC 4.65 02/20/2023 0708   HGB 13.1 02/20/2023 0708   HGB 11.9 (L) 06/10/2013 0209   HCT 40.6 02/20/2023 0708   HCT 36.1 06/10/2013 0209   PLT 255 02/20/2023 0708   PLT 214 06/10/2013 0209   MCV 87.3 02/20/2023 0708   MCV 92 06/10/2013 0209   MCH 28.2 02/20/2023 0708   MCHC 32.3 02/20/2023 0708   RDW 13.8 02/20/2023 0708   RDW 15.2 (H) 06/10/2013 0209   LYMPHSABS 2.0 06/22/2022 1153   LYMPHSABS 2.0 06/10/2013 0209   MONOABS 0.9 06/22/2022 1153   MONOABS 0.7 06/10/2013 0209   EOSABS 0.1 06/22/2022 1153   EOSABS 0.1 06/10/2013 0209   BASOSABS 0.1 06/22/2022 1153   BASOSABS 0.0 06/10/2013 0209    BMET    Component Value Date/Time   NA 137 02/20/2023 0708   NA 143 06/10/2013 0209   K 4.7 02/20/2023 0708   K 3.7 06/10/2013 0209   CL 106 02/20/2023 0708   CL 109 (H) 06/10/2013 0209   CO2 24 02/20/2023 0708   CO2 29 06/10/2013 0209   GLUCOSE 154 (H) 02/20/2023 0708   GLUCOSE 90 06/10/2013 0209   BUN 15 02/20/2023 0708   BUN  14 06/10/2013 0209    CREATININE 0.88 02/20/2023 0708   CREATININE 0.87 06/10/2013 0209   CALCIUM  8.9 02/20/2023 0708   CALCIUM  8.9 06/10/2013 0209   GFRNONAA >60 02/20/2023 0708   GFRNONAA >60 06/10/2013 0209   GFRAA >60 12/18/2018 0343   GFRAA >60 06/10/2013 0209      Assessment & Plan:  63 year old pleasant white female seen by me 4 years ago for chronic cough as well as underlying obstructive sleep apnea in the setting of obesity and deconditioned state with significant allergic rhinitis with underlying GERD with history of hiatal hernia in the setting of obesity, Brenda Craig with signs symptoms of cough variant asthma   Assessment of OSA Continue CPAP as prescribed  Excellent compliance report Reviewed compliance report in detail with Brenda Craig Brenda Craig definitely benefits the use of CPAP therapy as prescribed Using CPAP nightly and with naps Pressure setting is comfortable and is sleeping well. CPAP prescription change to auto 4-10 AHI reduced to 9  No evidence of acute heart failure at this time No respiratory distress No fevers, chills, nausea, vomiting, diarrhea No evidence hemoptysis  Brenda Craig Instructions Continue to use CPAP every night, minimum of 4-6 hours a night.  Change equipment every 30 days or as directed by DME.  Wash your tubing with warm soap and water  daily, hang to dry. Wash humidifier portion weekly. Use bottled, distilled water  and change daily   Be aware of reduced alertness and do not drive or operate heavy machinery if experiencing this or drowsiness.  Exercise encouraged, as tolerated. Encouraged proper weight management.  Important to get eight or more hours of sleep  Limiting the use of the computer and television before bedtime.  Decrease naps during the day, so night time sleep will become enhanced.  Limit caffeine, and sleep deprivation.  HTN, stroke, uncontrolled diabetes and heart failure are potential risk factors.  Risk of untreated sleep apnea including cardiac  arrhthymias, stroke, DM, pulm HTN.     Airway hyperreactivity due to allergic rhinitis Cough variant asthma Recommend starting Trelegy inhaler therapy   GERD Hiatal hernia PPI twice daily Pepcid  twice daily  Chronic cough - Brenda Craig has had a chronic cough for >10 years. She has scarring to right middle lung and left lower lung on prior chest imaging. Cough is currently productive with yellow mucus. No associated shortness of breath. I suspect PND is driving a lot of her coughing symptoms.  Also associated with morbid obesity and restrictive lung disease along with uncontrolled GERD No more indication for gabapentin  Brenda Craig refuses bronchoscopy  Obesity -recommend significant weight loss -recommend changing diet  Deconditioned state -Recommend increased daily activity and exercise   Hx of respiratory failure Oxygen discontinued    MEDICATION ADJUSTMENTS/LABS AND TESTS ORDERED: Continue Antibiotics and Prednisone  as prescribed Start Trelegy inhaler Rinse mouth after every use Change AUTO CPAP 4-10 cm h20 Use albuterol  as needed Recommend significant weight loss Continue medications for reflux disease   CURRENT MEDICATIONS REVIEWED AT LENGTH WITH Brenda Craig TODAY   Brenda Craig  satisfied with Plan of action and management. All questions answered  Follow up 6 months  Total time spent 45 minutes   Lady Pier, M.D.  [facility]  Medical Director [facility] Medical Director [facility] Cardio-Pulmonary Department

## 2023-05-01 NOTE — Patient Instructions (Signed)
 Continue Antibiotics and Prednisone  as prescribed Start Trelegy inhaler Rinse mouth after every use  Excellent Job A+ GOLD STAR!!  Change AUTO CPAP 4-10 cm h20  Patient Instructions Continue to use CPAP every night, minimum of 4-6 hours a night.  Change equipment every 30 days or as directed by DME.  Wash your tubing with warm soap and water  daily, hang to dry. Wash humidifier portion weekly. Use bottled, distilled water  and change daily   Be aware of reduced alertness and do not drive or operate heavy machinery if experiencing this or drowsiness.  Exercise encouraged, as tolerated. Encouraged proper weight management.  Important to get eight or more hours of sleep  Limiting the use of the computer and television before bedtime.  Decrease naps during the day, so night time sleep will become enhanced.  Limit caffeine, and sleep deprivation.    Avoid Allergens and Irritants Avoid secondhand smoke Avoid SICK contacts Recommend  Masking  when appropriate Recommend Keep up-to-date with vaccinations

## 2023-06-21 ENCOUNTER — Inpatient Hospital Stay: Payer: BC Managed Care – PPO | Attending: Oncology

## 2023-06-21 ENCOUNTER — Inpatient Hospital Stay (HOSPITAL_BASED_OUTPATIENT_CLINIC_OR_DEPARTMENT_OTHER): Payer: BC Managed Care – PPO | Admitting: Oncology

## 2023-06-21 ENCOUNTER — Encounter: Payer: Self-pay | Admitting: Oncology

## 2023-06-21 ENCOUNTER — Ambulatory Visit: Payer: Self-pay | Admitting: Oncology

## 2023-06-21 ENCOUNTER — Other Ambulatory Visit: Payer: Self-pay

## 2023-06-21 VITALS — BP 105/72 | HR 81 | Temp 97.6°F | Resp 18 | Ht 65.0 in | Wt 234.0 lb

## 2023-06-21 DIAGNOSIS — Z8049 Family history of malignant neoplasm of other genital organs: Secondary | ICD-10-CM | POA: Diagnosis not present

## 2023-06-21 DIAGNOSIS — E538 Deficiency of other specified B group vitamins: Secondary | ICD-10-CM | POA: Diagnosis not present

## 2023-06-21 DIAGNOSIS — Z79899 Other long term (current) drug therapy: Secondary | ICD-10-CM | POA: Insufficient documentation

## 2023-06-21 DIAGNOSIS — Z8051 Family history of malignant neoplasm of kidney: Secondary | ICD-10-CM | POA: Insufficient documentation

## 2023-06-21 DIAGNOSIS — Z8349 Family history of other endocrine, nutritional and metabolic diseases: Secondary | ICD-10-CM | POA: Diagnosis not present

## 2023-06-21 DIAGNOSIS — Z87442 Personal history of urinary calculi: Secondary | ICD-10-CM | POA: Insufficient documentation

## 2023-06-21 DIAGNOSIS — J45909 Unspecified asthma, uncomplicated: Secondary | ICD-10-CM | POA: Diagnosis not present

## 2023-06-21 DIAGNOSIS — I252 Old myocardial infarction: Secondary | ICD-10-CM | POA: Insufficient documentation

## 2023-06-21 DIAGNOSIS — K900 Celiac disease: Secondary | ICD-10-CM | POA: Insufficient documentation

## 2023-06-21 DIAGNOSIS — Z8249 Family history of ischemic heart disease and other diseases of the circulatory system: Secondary | ICD-10-CM | POA: Diagnosis not present

## 2023-06-21 DIAGNOSIS — K449 Diaphragmatic hernia without obstruction or gangrene: Secondary | ICD-10-CM | POA: Diagnosis not present

## 2023-06-21 DIAGNOSIS — Z823 Family history of stroke: Secondary | ICD-10-CM | POA: Insufficient documentation

## 2023-06-21 DIAGNOSIS — Z8674 Personal history of sudden cardiac arrest: Secondary | ICD-10-CM | POA: Insufficient documentation

## 2023-06-21 DIAGNOSIS — E611 Iron deficiency: Secondary | ICD-10-CM

## 2023-06-21 DIAGNOSIS — Z90721 Acquired absence of ovaries, unilateral: Secondary | ICD-10-CM | POA: Diagnosis not present

## 2023-06-21 DIAGNOSIS — Z91041 Radiographic dye allergy status: Secondary | ICD-10-CM | POA: Insufficient documentation

## 2023-06-21 DIAGNOSIS — I341 Nonrheumatic mitral (valve) prolapse: Secondary | ICD-10-CM | POA: Insufficient documentation

## 2023-06-21 DIAGNOSIS — Z833 Family history of diabetes mellitus: Secondary | ICD-10-CM | POA: Diagnosis not present

## 2023-06-21 DIAGNOSIS — Z885 Allergy status to narcotic agent status: Secondary | ICD-10-CM | POA: Diagnosis not present

## 2023-06-21 DIAGNOSIS — I2510 Atherosclerotic heart disease of native coronary artery without angina pectoris: Secondary | ICD-10-CM | POA: Insufficient documentation

## 2023-06-21 LAB — RETIC PANEL
Immature Retic Fract: 4.5 % (ref 2.3–15.9)
RBC.: 5.01 MIL/uL (ref 3.87–5.11)
Retic Count, Absolute: 71.1 10*3/uL (ref 19.0–186.0)
Retic Ct Pct: 1.4 % (ref 0.4–3.1)
Reticulocyte Hemoglobin: 29 pg (ref >27.9)

## 2023-06-21 LAB — IRON AND TIBC
Iron: 84 ug/dL (ref 28–170)
Saturation Ratios: 20 % (ref 10.4–31.8)
TIBC: 416 ug/dL (ref 250–450)
UIBC: 332 ug/dL

## 2023-06-21 LAB — CBC WITH DIFFERENTIAL (CANCER CENTER ONLY)
Abs Immature Granulocytes: 0.2 10*3/uL — ABNORMAL HIGH (ref 0.00–0.07)
Basophils Absolute: 0.1 10*3/uL (ref 0.0–0.1)
Basophils Relative: 1 %
Eosinophils Absolute: 0 10*3/uL (ref 0.0–0.5)
Eosinophils Relative: 0 %
HCT: 45.7 % (ref 36.0–46.0)
Hemoglobin: 14.1 g/dL (ref 12.0–15.0)
Immature Granulocytes: 2 %
Lymphocytes Relative: 12 %
Lymphs Abs: 1.5 10*3/uL (ref 0.7–4.0)
MCH: 27.5 pg (ref 26.0–34.0)
MCHC: 30.9 g/dL (ref 30.0–36.0)
MCV: 89.1 fL (ref 80.0–100.0)
Monocytes Absolute: 0.6 10*3/uL (ref 0.1–1.0)
Monocytes Relative: 5 %
Neutro Abs: 10.2 10*3/uL — ABNORMAL HIGH (ref 1.7–7.7)
Neutrophils Relative %: 80 %
Platelet Count: 273 10*3/uL (ref 150–400)
RBC: 5.13 MIL/uL — ABNORMAL HIGH (ref 3.87–5.11)
RDW: 14.4 % (ref 11.5–15.5)
WBC Count: 12.6 10*3/uL — ABNORMAL HIGH (ref 4.0–10.5)
nRBC: 0 % (ref 0.0–0.2)

## 2023-06-21 LAB — VITAMIN B12: Vitamin B-12: 193 pg/mL (ref 180–914)

## 2023-06-21 LAB — FERRITIN: Ferritin: 11 ng/mL (ref 11–307)

## 2023-06-21 NOTE — Progress Notes (Addendum)
 Hematology/Oncology Progress note [phone] [phone]     Patient Care Team: Westley Hammers, MD as PCP - General (Internal Medicine) Devorah Fonder, MD as PCP - Cardiology (Cardiology) Timmy Forbes, MD as Consulting Physician (Oncology)  ASSESSMENT & PLAN:   Iron  deficiency #Iron  deficiency secondary to her celiac disease.  Not able to tolerate oral iron  supplementation. Labs are reviewed and discussed with patient. Lab Results  Component Value Date   HGB 14.1 06/21/2023   TIBC 416 06/21/2023   IRONPCTSAT 20 06/21/2023   FERRITIN 11 06/21/2023    Ferritin is borderline.  Recommend patient to get  IV venofer  200mg  x 1   B12 deficiency B12 level is borderline.  Recommend B12 1000mcg monthly x 6, alternatively she may try sublingual B12 1000mcg daily.    Orders Placed This Encounter  Procedures   Vitamin B12    Standing Status:   Future    Number of Occurrences:   1    Expected Date:   06/21/2023    Expiration Date:   09/19/2023   CBC with Differential (Cancer Center Only)    Standing Status:   Future    Expected Date:   06/20/2024    Expiration Date:   06/20/2024   Iron  and TIBC    Standing Status:   Future    Expected Date:   06/20/2024    Expiration Date:   06/20/2024   Ferritin    Standing Status:   Future    Expected Date:   06/20/2024    Expiration Date:   06/20/2024   Vitamin B12    Standing Status:   Future    Expected Date:   06/20/2024    Expiration Date:   06/20/2024   Follow up in 12 months.  All questions were answered. The patient knows to call the clinic with any problems, questions or concerns.  Timmy Forbes, MD, PhD [facility] Hematology Oncology 06/21/2023   CHIEF COMPLAINTS/REASON FOR VISIT:  Follow up with iron  deficiency  HISTORY OF PRESENTING ILLNESS:   Brenda Craig is a  63 y.o.  female with PMH listed below was seen in consultation at the request of  Westley Hammers, MD  for evaluation of iron  deficiency. Patient has  a history of celiac disease, she also has a history of iron  deficiency. recent EGD negative for active disease and tTG IgA negative suggests good compliance with GFD She has tried oral iron  supplementation and did not tolerate due to GI side effects.  She was referred by gastroenterology for evaluation of iron  deficiency and consideration of iron  iron  treatments. Patient reports history of IV iron  infusion..  After iron  infusion, she developed a mild infusion reactions with skin itchiness/rash.  Symptoms were improved after Benadryl .  She was able to finish iron  infusions with Benadryl  as premed made an approximate.  Patient has history of coronary artery disease/STEMI, status post PCI x2. reports feeling tired Denies any black or bloody stool.  EGD: 02/17/2021 - 6 cm hiatal hernia, few fundic gland polyps in gastric body, normal examined duodenum with vili intact and no evidence of CD Colonoscopy: 02/17/2021 - one 5 mm TA removed from ascending colon  INTERVAL HISTORY Brenda Craig is a 63 y.o. female who has above history reviewed by me today presents for follow up visit for iron  deficiency due to celiac disease.  She feels well no new complaints.     Review of Systems  Constitutional:  Negative for appetite change, chills,  fatigue and fever.  HENT:   Negative for hearing loss and voice change.   Eyes:  Negative for eye problems.  Respiratory:  Negative for chest tightness and cough.   Cardiovascular:  Negative for chest pain.  Gastrointestinal:  Negative for abdominal distention, abdominal pain and blood in stool.  Endocrine: Negative for hot flashes.  Genitourinary:  Negative for difficulty urinating and frequency.   Musculoskeletal:  Negative for arthralgias.  Skin:  Negative for itching and rash.  Neurological:  Negative for extremity weakness.  Hematological:  Negative for adenopathy.  Psychiatric/Behavioral:  Negative for confusion.     MEDICAL HISTORY:  Past Medical  History:  Diagnosis Date   Abnormal Pap smear of cervix 1988   Acid reflux 01/17/2013   Allergic rhinitis    Anemia    Anginal pain (HCC)    Anxiety    Arthritis    Asthma    Bronchiectasis (HCC)    Celiac disease    Cervical dysplasia 1988   CHF (congestive heart failure) (HCC)    Chronic kidney disease    Complication of anesthesia    bp drops after anesthesia and has to be kept overnight   Coronary artery disease    Headache    migraine with visual aura   Heart trouble    History of cardiac arrest    History of hiatal hernia    History of kidney stones    History of mammogram 03/25/2013; 12/07/14   BIRADS 1; NEG   History of Papanicolaou smear of cervix 03/20/12; 12/28/15   -/-; -/-   Hypercholesteremia    Hypertension    IBS (irritable bowel syndrome)    Menopausal symptoms    MI (myocardial infarction) (HCC)    Mitral valve prolapse    Pneumonia    PONV (postoperative nausea and vomiting)    nausea only   Sleep apnea    Vulvovaginitis    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    SURGICAL HISTORY: Past Surgical History:  Procedure Laterality Date   ABLATION  2013   [facility]   BACK SURGERY     BACK SURGERY  2016; 2017   L4, L5   CARDIAC SURGERY  03/19/12; 10/2014   HEART CATH AND STENT   CESAREAN SECTION     1986; 1989   COLD KNIFE CONE BIOPSY  1988   COLONOSCOPY  08/2010   16 POLYPS (ALL BENIGN) DX C CELIAC DISEASE   COLONOSCOPY N/A 02/17/2021   Procedure: COLONOSCOPY;  Surgeon: Shane Darling, MD;  Location: [facility] ENDOSCOPY;  Service: Endoscopy;  Laterality: N/A;   COMBINED HYSTEROSCOPY DIAGNOSTIC / D&C  2013   [facility]   CYSTOSCOPY W/ RETROGRADES Left 01/20/2019   Procedure: CYSTOSCOPY WITH RETROGRADE PYELOGRAM;  Surgeon: Geraline Knapp, MD;  Location: [facility] ORS;  Service: Urology;  Laterality: Left;   CYSTOSCOPY W/ URETERAL STENT PLACEMENT Left 12/13/2018   Procedure: CYSTOSCOPY WITH RETROGRADE PYELOGRAM/URETERAL STENT PLACEMENT;  Surgeon:  Homero Luster, MD;  Location: [facility] ORS;  Service: Urology;  Laterality: Left;   CYSTOSCOPY/URETEROSCOPY/HOLMIUM LASER/STENT PLACEMENT Left 01/20/2019   Procedure: CYSTOSCOPY/URETEROSCOPY/HOLMIUM LASER/STENT EXCHANGE;  Surgeon: Geraline Knapp, MD;  Location: [facility] ORS;  Service: Urology;  Laterality: Left;   DILATION AND CURETTAGE OF UTERUS  2001   ESOPHAGOGASTRODUODENOSCOPY  08/2010   DX C CELIAC DISEASE   ESOPHAGOGASTRODUODENOSCOPY N/A 02/17/2021   Procedure: ESOPHAGOGASTRODUODENOSCOPY (EGD);  Surgeon: Shane Darling, MD;  Location[facility];  Service: Endoscopy;  Laterality: N/A;  HEART STENTS  05/27/2011   HIP SURGERY  2016   RIGHT IT BAND AND BURSA REMOVAL   LEFT HEART CATH AND CORONARY ANGIOGRAPHY N/A 02/21/2023   Procedure: LEFT HEART CATH AND CORONARY ANGIOGRAPHY;  Surgeon: Arleen Lacer, MD;  Location: [facility] IN[facility]IVE [facility];  Service: Cardiovascular;  Laterality: N/A;   OOPHORECTOMY Right 1982   TOTAL KNEE ARTHROPLASTY Left 01/19/2020   Procedure: TOTAL KNEE ARTHROPLASTY;  Surgeon: Saundra Curl, MD;  Location: [facility] ORS;  Service: Orthopedics;  Laterality: Left;   TOTAL KNEE REVISION Left 03/07/2022   Procedure: TOTAL KNEE REVISION;  Surgeon: Murleen Arms, MD;  Location: [facility] ORS;  Service: Orthopedics;  Laterality: Left;    SOCIAL HISTORY: Social History   Socioeconomic History   Marital status: Divorced    Spouse name: Not on file   Number of children: 2   Years of education: 16   Highest education level: Not on file  Occupational History   Occupation: TEACHER  Tobacco Use   Smoking status: Never    Passive exposure: Never   Smokeless tobacco: Never  Vaping Use   Vaping status: Never Used  Substance and Sexual Activity   Alcohol  use: Not Currently    Comment: occasionally   Drug use: No   Sexual activity: Not Currently    Birth control/protection: Surgical  Other Topics Concern   Not on file  Social History Narrative   Not on file   Social  Drivers of Health   Financial Resource Strain: Not on file  Food Insecurity: No Food Insecurity (02/20/2023)   Hunger Vital Sign    Worried About Running Out of Food in the Last Year: Never true    Ran Out of Food in the Last Year: Never true  Transportation Needs: No Transportation Needs (02/20/2023)   PRAPARE - Administrator, Civil Service (Medical): No    Lack of Transportation (Non-Medical): No  Physical Activity: Not on file  Stress: Not on file  Social Connections: Not on file  Intimate Partner Violence: Not At Risk (02/20/2023)   Humiliation, Afraid, Rape, and Kick questionnaire    Fear of Current or Ex-Partner: No    Emotionally Abused: No    Physically Abused: No    Sexually Abused: No    FAMILY HISTORY: Family History  Problem Relation Age of Onset   CAD Father    Transient ischemic attack Father    Diabetes Father    Cerebrovascular Accident Father    Heart disease Father        M-MVP; F BETA;BLOCKERS   Hypothyroidism Father    Hyperthyroidism Mother    Cancer Maternal Uncle        KIDNEY   Uterine cancer Maternal Aunt 24    ALLERGIES:  is allergic to other, codeine , esomeprazole magnesium , iodinated contrast media, omeprazole magnesium , percocet [oxycodone -acetaminophen ], and tramadol .  MEDICATIONS:  Current Outpatient Medications  Medication Sig Dispense Refill   acetaminophen  (TYLENOL ) 500 MG tablet Take 1,000 mg by mouth as needed for moderate pain or headache.     albuterol  (PROVENTIL ) (2.5 MG/3ML) 0.083% nebulizer solution Take 3 mLs (2.5 mg total) by nebulization every 4 (four) hours as needed for wheezing or shortness of breath (coughing fits). 75 mL 1   albuterol  (VENTOLIN  HFA) 108 (90 Base) MCG/ACT inhaler Inhale 2 puffs into the lungs as needed for wheezing or shortness of breath.     aspirin  EC 81 MG tablet Take 81 mg by mouth daily. Swallow whole.  benzonatate  (TESSALON ) 200 MG capsule Take 200 mg by mouth 3 (three) times daily as  needed.     cetirizine  (ZYRTEC ) 10 MG tablet TAKE 1 TABLET BY MOUTH EVERYDAY AT BEDTIME 90 tablet 0   clobetasol  ointment (TEMOVATE ) 0.05 % Apply to affected area every night for 4 weeks, then every other day for 4 weeks and then 1-2 times a week for maintenance 45 g 0   diclofenac Sodium (VOLTAREN) 1 % GEL Apply 2 g topically 4 (four) times daily.     dicyclomine  (BENTYL ) 10 MG capsule Take 10 mg by mouth as needed for spasms.     diphenhydrAMINE  (BENADRYL  ALLERGY ) 25 MG tablet Take 25 mg by mouth as needed for itching.     famotidine  (PEPCID ) 40 MG tablet Take 40 mg by mouth at bedtime.     fluconazole  (DIFLUCAN ) 150 MG tablet Take by mouth.     Fluticasone -Umeclidin-Vilant (TRELEGY ELLIPTA ) 200-62.5-25 MCG/ACT AEPB Inhale 1 Act into the lungs daily. 1 each 5   Fluticasone -Umeclidin-Vilant (TRELEGY ELLIPTA ) 200-62.5-25 MCG/ACT AEPB Inhale 1 puff into the lungs daily.     gabapentin  (NEURONTIN ) 100 MG capsule Take 1 capsule (100 mg total) by mouth 3 (three) times daily. 90 capsule 1   LORazepam  (ATIVAN ) 1 MG tablet Take 1 mg by mouth 2 (two) times daily.     losartan  (COZAAR ) 25 MG tablet Take 1 tablet (25 mg total) by mouth daily. 90 tablet 3   Melatonin 5 MG CAPS Take 5 mg by mouth at bedtime.     montelukast  (SINGULAIR ) 10 MG tablet Take 10 mg by mouth daily.     nebivolol  (BYSTOLIC ) 5 MG tablet Take 1 tablet (5 mg total) by mouth daily with supper. 90 tablet 3   nitroGLYCERIN  (NITROSTAT ) 0.4 MG SL tablet Place 1 tablet (0.4 mg total) under the tongue every 5 (five) minutes as needed for chest pain. 25 tablet 3   nystatin cream (MYCOSTATIN) Apply 1 Application topically as needed (rash).     pantoprazole  (PROTONIX ) 20 MG tablet Take 40 mg by mouth daily.     predniSONE  (DELTASONE ) 10 MG tablet Take by mouth.     rosuvastatin  (CRESTOR ) 10 MG tablet Take 10 mg by mouth at bedtime.     triamcinolone  cream (KENALOG ) 0.1 % Apply 1 Application topically as needed (break out).     cefdinir  (OMNICEF) 300 MG capsule Take 300 mg by mouth every 12 (twelve) hours. (Patient not taking: Reported on 06/21/2023)     chlorpheniramine-HYDROcodone  (TUSSIONEX) 10-8 MG/5ML Take 5 mLs by mouth at bedtime as needed for cough. (Patient not taking: Reported on 06/21/2023)     No current facility-administered medications for this visit.     PHYSICAL EXAMINATION:  Vitals:   06/21/23 1142  BP: 105/72  Pulse: 81  Resp: 18  Temp: 97.6 F (36.4 C)   Filed Weights   06/21/23 1142  Weight: 234 lb (106.1 kg)    Physical Exam Constitutional:      General: She is not in acute distress. HENT:     Head: Normocephalic and atraumatic.   Eyes:     General: No scleral icterus.   Cardiovascular:     Rate and Rhythm: Normal rate.  Pulmonary:     Effort: Pulmonary effort is normal. No respiratory distress.  Abdominal:     General: There is no distension.   Musculoskeletal:        General: No deformity. Normal range of motion.     Cervical back:  Normal range of motion.   Skin:    Findings: No erythema or rash.   Neurological:     Mental Status: She is alert and oriented to person, place, and time. Mental status is at baseline.   Psychiatric:        Mood and Affect: Mood normal.     LABORATORY DATA:  I have reviewed the data as listed Lab Results  Component Value Date   WBC 12.6 (H) 06/21/2023   HGB 14.1 06/21/2023   HCT 45.7 06/21/2023   MCV 89.1 06/21/2023   PLT 273 06/21/2023   Recent Labs    02/19/23 1425 02/19/23 1710 02/20/23 0708  NA 135  --  137  K 3.5  --  4.7  CL 101  --  106  CO2 23  --  24  GLUCOSE 134*  --  154*  BUN 20  --  15  CREATININE 1.06*  --  0.88  CALCIUM  8.6*  --  8.9  GFRNONAA 59*  --  >60  PROT  --  6.9  --   ALBUMIN   --  3.8  --   AST  --  24  --   ALT  --  24  --   ALKPHOS  --  161*  --   BILITOT  --  0.6  --   BILIDIR  --  0.1  --   IBILI  --  0.5  --    Iron /TIBC/Ferritin/ %Sat    Component Value Date/Time   IRON  84  06/21/2023 1140   TIBC 416 06/21/2023 1140   FERRITIN 11 06/21/2023 1140   IRONPCTSAT 20 06/21/2023 1140      RADIOGRAPHIC STUDIES: I have personally reviewed the radiological images as listed and agreed with the findings in the report. No results found.

## 2023-06-21 NOTE — Assessment & Plan Note (Addendum)
#  Iron  deficiency secondary to her celiac disease.  Not able to tolerate oral iron  supplementation. Labs are reviewed and discussed with patient. Lab Results  Component Value Date   HGB 14.1 06/21/2023   TIBC 416 06/21/2023   IRONPCTSAT 20 06/21/2023   FERRITIN 11 06/21/2023    Ferritin is borderline.  Recommend patient to get  IV venofer  200mg  x 1

## 2023-06-21 NOTE — Assessment & Plan Note (Addendum)
 B12 level is borderline.  Recommend B12 1000mcg monthly x 6, alternatively she may try sublingual B12 1000mcg daily.

## 2023-06-24 NOTE — Telephone Encounter (Signed)
 Spoke to pt and informed her of recommendation for venofer  x1 and B12 inj  monthly. Pt agreeable to monthly B12 inj as she has tried oral and did not work.   Pt transferred to scheduling to set up appts.

## 2023-06-24 NOTE — Telephone Encounter (Signed)
-----   Message from Timmy Forbes sent at 06/21/2023  9:25 PM EDT ----- Please let patient know that her iron  level is normal, at low end, I recommend one dose of Venofer . B12 is <400, recommend B12 injection monthly x 6 or she can try B12 1000mcg sublingual daily. If  she prefers injection, please arrange.  ----- Message ----- From: Dannis Dy, [facility] [place] Sent: 06/21/2023  11:46 AM EDT To: Timmy Forbes, MD

## 2023-06-25 LAB — LAB REPORT - SCANNED: EGFR: 61

## 2023-06-27 ENCOUNTER — Ambulatory Visit
Admission: RE | Admit: 2023-06-27 | Discharge: 2023-06-27 | Disposition: A | Discharge status: Home / Self Care | Source: Ambulatory Visit | Attending: Physician Assistant | Admitting: Physician Assistant

## 2023-06-27 ENCOUNTER — Other Ambulatory Visit: Payer: Self-pay

## 2023-06-27 ENCOUNTER — Ambulatory Visit: Payer: Self-pay | Admitting: Physician Assistant

## 2023-06-27 VITALS — BP 116/74 | HR 74 | Ht 65.0 in | Wt 235.4 lb

## 2023-06-27 DIAGNOSIS — L900 Lichen sclerosus et atrophicus: Secondary | ICD-10-CM

## 2023-06-27 DIAGNOSIS — Z87442 Personal history of urinary calculi: Secondary | ICD-10-CM | POA: Diagnosis present

## 2023-06-27 DIAGNOSIS — R109 Unspecified abdominal pain: Secondary | ICD-10-CM

## 2023-06-27 DIAGNOSIS — N393 Stress incontinence (female) (male): Secondary | ICD-10-CM

## 2023-06-27 NOTE — Progress Notes (Signed)
 06/27/2023 4:20 PM   Read Camel [birth year] [id number]  CC: Chief Complaint  Patient presents with   Nephrolithiasis   HPI: Brenda Craig is a 64 y.o. female with PMH nephrolithiasis, Celiac disease, and back problems who presents today for annual follow up.   Today she reports no flank pain, gross hematuria, or stones passed this year.  She reports chronic daytime frequency every 1-2 hours and urgency first thing in the morning. No nocturia or urge incontinence. She is also having new stress incontinence this year, especially with cough.  Lastly, she was diagnosed with lichen sclerosus this year. She will have occasional dysuria, and this resolves with her topical steroid cream.  KUB today with no radiopaque urolithiasis.  PMH: Past Medical History:  Diagnosis Date   Abnormal Pap smear of cervix 1988   Acid reflux 01/17/2013   Allergic rhinitis    Anemia    Anginal pain (HCC)    Anxiety    Arthritis    Asthma    Bronchiectasis (HCC)    Celiac disease    Cervical dysplasia 1988   CHF (congestive heart failure) (HCC)    Chronic kidney disease    Complication of anesthesia    bp drops after anesthesia and has to be kept overnight   Coronary artery disease    Headache    migraine with visual aura   Heart trouble    History of cardiac arrest    History of hiatal hernia    History of kidney stones    History of mammogram 03/25/2013; 12/07/14   BIRADS 1; NEG   History of Papanicolaou smear of cervix 03/20/12; 12/28/15   -/-; -/-   Hypercholesteremia    Hypertension    IBS (irritable bowel syndrome)    Menopausal symptoms    MI (myocardial infarction) (HCC)    Mitral valve prolapse    Pneumonia    PONV (postoperative nausea and vomiting)    nausea only   Sleep apnea    Vulvovaginitis    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    Surgical History: Past Surgical History:  Procedure Laterality Date   ABLATION  2013   [facility]   BACK  SURGERY     BACK SURGERY  2016; 2017   L4, L5   CARDIAC SURGERY  03/19/12; 10/2014   HEART CATH AND STENT   CESAREAN SECTION     1986; 1989   COLD KNIFE CONE BIOPSY  1988   COLONOSCOPY  08/2010   16 POLYPS (ALL BENIGN) DX C CELIAC DISEASE   COLONOSCOPY N/A 02/17/2021   Procedure: COLONOSCOPY;  Surgeon: Shane Darling, MD;  Location: [facility];  Service: Endoscopy;  Laterality: N/A;   COMBINED HYSTEROSCOPY DIAGNOSTIC / D&C  2013   [facility]   CYSTOSCOPY W/ RETROGRADES Left 01/20/2019   Procedure: CYSTOSCOPY WITH RETROGRADE PYELOGRAM;  Surgeon: Geraline Knapp, MD;  Location: [facility];  Service: Urology;  Laterality: Left;   CYSTOSCOPY W/ URETERAL STENT PLACEMENT Left 12/13/2018   Procedure: CYSTOSCOPY WITH RETROGRADE PYELOGRAM/URETERAL STENT PLACEMENT;  Surgeon: Homero Luster, MD;  Location: [facility];  Service: Urology;  Laterality: Left;   CYSTOSCOPY/URETEROSCOPY/HOLMIUM LASER/STENT PLACEMENT Left 01/20/2019   Procedure: CYSTOSCOPY/URETEROSCOPY/HOLMIUM LASER/STENT EXCHANGE;  Surgeon: Geraline Knapp, MD;  Location: [facility];  Service: Urology;  Laterality: Left;   DILATION AND CURETTAGE OF UTERUS  2001   ESOPHAGOGASTRODUODENOSCOPY  08/2010   DX C CELIAC DISEASE   ESOPHAGOGASTRODUODENOSCOPY N/A 02/17/2021   Procedure:  ESOPHAGOGASTRODUODENOSCOPY (EGD);  Surgeon: Shane Darling, MD;  Location: [facility] ENDOSCOPY;  Service: Endoscopy;  Laterality: N/A;   HEART STENTS  05/27/2011   HIP SURGERY  2016   RIGHT IT BAND AND BURSA REMOVAL   LEFT HEART CATH AND CORONARY ANGIOGRAPHY N/A 02/21/2023   Procedure: LEFT HEART CATH AND CORONARY ANGIOGRAPHY;  Surgeon: Arleen Lacer, MD;  Location: [facility];  Service: Cardiovascular;  Laterality: N/A;   OOPHORECTOMY Right 1982   TOTAL KNEE ARTHROPLASTY Left 01/19/2020   Procedure: TOTAL KNEE ARTHROPLASTY;  Surgeon: Saundra Curl, MD;  Location: [facility];  Service: Orthopedics;  Laterality: Left;   TOTAL KNEE REVISION Left 03/07/2022    Procedure: TOTAL KNEE REVISION;  Surgeon: Murleen Arms, MD;  Location: [facility];  Service: Orthopedics;  Laterality: Left;    Home Medications:  Allergies as of 06/27/2023       Reactions   Other Other (See Comments)   Gluten allergy  Celiac    Codeine  Nausea And Vomiting, Rash   Esomeprazole Magnesium  Palpitations, Other (See Comments)   IRREGULAR HEARTBEAT   Iodinated Contrast Media Itching   Omeprazole Magnesium  Palpitations, Other (See Comments)   IRREGULAR HEARTBEAT   Percocet [oxycodone -acetaminophen ] Itching, Nausea And Vomiting, Rash   Can take with benadryl     Tramadol  Rash   Can take with benadryl          Medication List        Accurate as of June 27, 2023  4:20 PM. If you have any questions, ask your nurse or doctor.          STOP taking these medications    cefdinir 300 MG capsule Commonly known as: OMNICEF Stopped by: Morayo Leven   chlorpheniramine-HYDROcodone  10-8 MG/5ML Commonly known as: TUSSIONEX Stopped by: Annais Crafts   diclofenac Sodium 1 % Gel Commonly known as: VOLTAREN Stopped by: Lorelle Macaluso   dicyclomine  10 MG capsule Commonly known as: BENTYL  Stopped by: Clydette Privitera   gabapentin  100 MG capsule Commonly known as: Neurontin  Stopped by: Kathreen Pare       TAKE these medications    acetaminophen  500 MG tablet Commonly known as: TYLENOL  Take 1,000 mg by mouth as needed for moderate pain or headache.   albuterol  108 (90 Base) MCG/ACT inhaler Commonly known as: VENTOLIN  HFA Inhale 2 puffs into the lungs as needed for wheezing or shortness of breath. What changed: Another medication with the same name was removed. Continue taking this medication, and follow the directions you see here. Changed by: Kathreen Pare   aspirin  EC 81 MG tablet Take 81 mg by mouth daily. Swallow whole.   Benadryl  Allergy  25 MG tablet Generic drug: diphenhydrAMINE  Take 25 mg by mouth as  needed for itching.   benzonatate  200 MG capsule Commonly known as: TESSALON  Take 200 mg by mouth 3 (three) times daily as needed.   cetirizine  10 MG tablet Commonly known as: ZYRTEC  TAKE 1 TABLET BY MOUTH EVERYDAY AT BEDTIME   clobetasol  ointment 0.05 % Commonly known as: TEMOVATE  Apply to affected area every night for 4 weeks, then every other day for 4 weeks and then 1-2 times a week for maintenance   famotidine  40 MG tablet Commonly known as: PEPCID  Take 40 mg by mouth at bedtime.   fluconazole  150 MG tablet Commonly known as: DIFLUCAN  Take by mouth. As needed   LORazepam  1 MG tablet Commonly known as: ATIVAN  Take 1 mg by mouth 2 (two) times daily.   losartan  25 MG tablet  Commonly known as: COZAAR  Take 1 tablet (25 mg total) by mouth daily.   Melatonin 5 MG Caps Take 5 mg by mouth at bedtime.   montelukast  10 MG tablet Commonly known as: SINGULAIR  Take 10 mg by mouth daily.   nebivolol  5 MG tablet Commonly known as: BYSTOLIC  Take 1 tablet (5 mg total) by mouth daily with supper.   nitroGLYCERIN  0.4 MG SL tablet Commonly known as: Nitrostat  Place 1 tablet (0.4 mg total) under the tongue every 5 (five) minutes as needed for chest pain.   nystatin cream Commonly known as: MYCOSTATIN Apply 1 Application topically as needed (rash).   pantoprazole  20 MG tablet Commonly known as: PROTONIX  Take 40 mg by mouth daily.   predniSONE  10 MG tablet Commonly known as: DELTASONE  Take by mouth.   rosuvastatin  10 MG tablet Commonly known as: CRESTOR  Take 10 mg by mouth at bedtime.   Trelegy Ellipta  200-62.5-25 MCG/ACT Aepb Generic drug: Fluticasone -Umeclidin-Vilant Inhale 1 puff into the lungs daily.   triamcinolone  cream 0.1 % Commonly known as: KENALOG  Apply 1 Application topically as needed (break out).        Allergies:  Allergies  Allergen Reactions   Other Other (See Comments)    Gluten allergy  Celiac    Codeine  Nausea And Vomiting and Rash    Esomeprazole Magnesium  Palpitations and Other (See Comments)    IRREGULAR HEARTBEAT   Iodinated Contrast Media Itching   Omeprazole Magnesium  Palpitations and Other (See Comments)    IRREGULAR HEARTBEAT   Percocet [Oxycodone -Acetaminophen ] Itching, Nausea And Vomiting and Rash    Can take with benadryl     Tramadol  Rash    Can take with benadryl      Family History: Family History  Problem Relation Age of Onset   CAD Father    Transient ischemic attack Father    Diabetes Father    Cerebrovascular Accident Father    Heart disease Father        M-MVP; F BETA;BLOCKERS   Hypothyroidism Father    Hyperthyroidism Mother    Cancer Maternal Uncle        KIDNEY   Uterine cancer Maternal Aunt 37    Social History:   reports that she has never smoked. She has never been exposed to tobacco smoke. She has never used smokeless tobacco. She reports that she does not currently use alcohol . She reports that she does not use drugs.  Physical Exam: BP 116/74   Pulse 74   Ht 5' 5 (1.651 m)   Wt 235 lb 6 oz (106.8 kg)   BMI 39.17 kg/m   Constitutional:  Alert and oriented, no acute distress, nontoxic appearing HEENT: [place], AT Cardiovascular: No clubbing, cyanosis, or edema Respiratory: Normal respiratory effort, no increased work of breathing Skin: No rashes, bruises or suspicious lesions Neurologic: Grossly intact, no focal deficits, moving all 4 extremities Psychiatric: Normal mood and affect  Pertinent Imaging: KUB, 06/27/2023: See epic  I personally reviewed the images referenced above and note no radiopaque urolithiasis.  Assessment & Plan:   1. History of nephrolithiasis (Primary) Asymptomatic, no stones on KUB. She prefers to keep annual follow up. - Abdomen 1 view (KUB); Future  2. Stress incontinence, female New this year. We discussed Kegel exercises versus pelvic floor PT and she prefers to start with the former, which is reasonable. May consider PT referral per patient  preference.  3. Lichen sclerosus We discussed that this can frequently mimic UTI symptoms with vulvovaginal irritation. Agree with topical treatments.  Return  in about 1 year (around 06/26/2024) for Annual stone visit with KUB prior.  Kathreen Pare, PA-C  [facility] [place] [address] [place], [place] [address] [phone]

## 2023-06-28 ENCOUNTER — Inpatient Hospital Stay

## 2023-06-28 VITALS — BP 98/70 | HR 74 | Temp 97.3°F | Resp 17

## 2023-06-28 DIAGNOSIS — E611 Iron deficiency: Secondary | ICD-10-CM

## 2023-06-28 MED ORDER — CYANOCOBALAMIN 1000 MCG/ML IJ SOLN
1000.0000 ug | Freq: Once | INTRAMUSCULAR | Status: AC
  Administered 2023-06-28: 1000 ug via INTRAMUSCULAR
  Filled 2023-06-28: qty 1

## 2023-06-28 MED ORDER — SODIUM CHLORIDE 0.9% FLUSH
10.0000 mL | Freq: Once | INTRAVENOUS | Status: AC | PRN
  Administered 2023-06-28: 10 mL
  Filled 2023-06-28: qty 10

## 2023-06-28 MED ORDER — IRON SUCROSE 20 MG/ML IV SOLN
200.0000 mg | Freq: Once | INTRAVENOUS | Status: AC
  Administered 2023-06-28: 200 mg via INTRAVENOUS
  Filled 2023-06-28: qty 10

## 2023-06-28 NOTE — Patient Instructions (Signed)

## 2023-06-28 NOTE — Progress Notes (Signed)
 Patient stated she took bendaryl before she came

## 2023-07-17 ENCOUNTER — Encounter: Payer: Self-pay | Admitting: Ophthalmology

## 2023-07-19 NOTE — Discharge Instructions (Signed)

## 2023-07-23 ENCOUNTER — Encounter: Payer: Self-pay | Admitting: Ophthalmology

## 2023-07-23 NOTE — Anesthesia Preprocedure Evaluation (Addendum)
 Anesthesia Evaluation  Patient identified by MRN, date of birth, ID band Patient awake    Reviewed: Allergy  & Precautions, NPO status , Patient's Chart, lab work & pertinent test results  History of Anesthesia Complications (+) PONV and history of anesthetic complications  Airway Mallampati: III  TM Distance: >3 FB Neck ROM: full    Dental no notable dental hx.    Pulmonary asthma , sleep apnea    Pulmonary exam normal        Cardiovascular hypertension, + CAD, + Past MI and +CHF  Normal cardiovascular exam  02-20-23 echo  1. Left ventricular ejection fraction, by estimation, is 60 to 65%. The  left ventricle has normal function. The left ventricle has no regional  wall motion abnormalities. Left ventricular diastolic parameters are  consistent with Grade I diastolic  dysfunction (impaired relaxation).   2. Right ventricular systolic function is normal. The right ventricular  size is normal.   3. The mitral valve is normal in structure. No evidence of mitral valve  regurgitation. No evidence of mitral stenosis.   4. The aortic valve is normal in structure. Aortic valve regurgitation is  not visualized. No aortic stenosis is present.   5. There is borderline dilatation of the ascending aorta, measuring 38  mm.   6. The inferior vena cava is normal in size with greater than 50%  respiratory variability, suggesting right atrial pressure of 3 mmHg.      Neuro/Psych  Headaches  Anxiety      Neuromuscular disease  negative psych ROS   GI/Hepatic Neg liver ROS, hiatal hernia, PUD,GERD  ,,  Endo/Other  negative endocrine ROS    Renal/GU Renal disease     Musculoskeletal   Abdominal   Peds  Hematology  (+) Blood dyscrasia, anemia   Anesthesia Other Findings Grade I diastolic dysfunction  Heart trouble Celiac disease Acid reflux Coronary artery disease CHF (congestive heart failure)  (HCC) Asthma Hypertension Hypercholesteremia MI (myocardial infarction) (HCC) History of cardiac arrest Abnormal Pap smear of cervix Allergic rhinitis Anemia Anxiety Cervical dysplasia History of mammogram Menopausal symptoms Mitral valve prolapse History of Papanicolaou smear of cervix Vulvovaginitis Sleep apnea Complication of anesthesia PONV (postoperative nausea and vomiting) Anginal pain (HCC) Pneumonia History of kidney stones Arthritis History of hiatal hernia IBS (irritable bowel syndrome) Headache Chronic kidney disease Bronchiectasis (HCC) Cough variant asthma      Reproductive/Obstetrics negative OB ROS                              Anesthesia Physical Anesthesia Plan  ASA: 4  Anesthesia Plan: MAC   Post-op Pain Management:    Induction: Intravenous  PONV Risk Score and Plan:   Airway Management Planned: Natural Airway and Nasal Cannula  Additional Equipment:   Intra-op Plan:   Post-operative Plan:   Informed Consent: I have reviewed the patients History and Physical, chart, labs and discussed the procedure including the risks, benefits and alternatives for the proposed anesthesia with the patient or authorized representative who has indicated his/her understanding and acceptance.     Dental Advisory Given  Plan Discussed with: Anesthesiologist, CRNA and Surgeon  Anesthesia Plan Comments: (Patient consented for risks of anesthesia including but not limited to:  - adverse reactions to medications - damage to eyes, teeth, lips or other oral mucosa - nerve damage due to positioning  - sore throat or hoarseness - Damage to heart, brain, nerves, lungs, other parts of  body or loss of life  Patient voiced understanding and assent.)         Anesthesia Quick Evaluation

## 2023-07-25 ENCOUNTER — Other Ambulatory Visit: Payer: Self-pay

## 2023-07-25 ENCOUNTER — Ambulatory Visit: Payer: Self-pay | Admitting: Anesthesiology

## 2023-07-25 ENCOUNTER — Ambulatory Visit
Admission: RE | Admit: 2023-07-25 | Discharge: 2023-07-25 | Disposition: A | Discharge status: Home / Self Care | Attending: Ophthalmology | Admitting: Ophthalmology

## 2023-07-25 ENCOUNTER — Encounter: Payer: Self-pay | Admitting: Ophthalmology

## 2023-07-25 ENCOUNTER — Encounter
Admission: RE | Disposition: A | Discharge status: Home / Self Care | Payer: Self-pay | Source: Home / Self Care | Attending: Ophthalmology

## 2023-07-25 DIAGNOSIS — I509 Heart failure, unspecified: Secondary | ICD-10-CM | POA: Insufficient documentation

## 2023-07-25 DIAGNOSIS — K449 Diaphragmatic hernia without obstruction or gangrene: Secondary | ICD-10-CM | POA: Diagnosis not present

## 2023-07-25 DIAGNOSIS — I252 Old myocardial infarction: Secondary | ICD-10-CM | POA: Diagnosis not present

## 2023-07-25 DIAGNOSIS — H2511 Age-related nuclear cataract, right eye: Secondary | ICD-10-CM | POA: Diagnosis present

## 2023-07-25 DIAGNOSIS — I2510 Atherosclerotic heart disease of native coronary artery without angina pectoris: Secondary | ICD-10-CM | POA: Insufficient documentation

## 2023-07-25 DIAGNOSIS — K219 Gastro-esophageal reflux disease without esophagitis: Secondary | ICD-10-CM | POA: Insufficient documentation

## 2023-07-25 DIAGNOSIS — I130 Hypertensive heart and chronic kidney disease with heart failure and stage 1 through stage 4 chronic kidney disease, or unspecified chronic kidney disease: Secondary | ICD-10-CM | POA: Insufficient documentation

## 2023-07-25 DIAGNOSIS — N189 Chronic kidney disease, unspecified: Secondary | ICD-10-CM | POA: Diagnosis not present

## 2023-07-25 DIAGNOSIS — F419 Anxiety disorder, unspecified: Secondary | ICD-10-CM | POA: Diagnosis not present

## 2023-07-25 HISTORY — DX: Cough variant asthma: J45.991

## 2023-07-25 HISTORY — DX: Other ill-defined heart diseases: I51.89

## 2023-07-25 SURGERY — PHACOEMULSIFICATION, CATARACT, WITH IOL INSERTION
Anesthesia: Monitor Anesthesia Care | Site: Eye | Laterality: Right

## 2023-07-25 MED ORDER — BRIMONIDINE TARTRATE-TIMOLOL 0.2-0.5 % OP SOLN
OPHTHALMIC | Status: DC | PRN
  Administered 2023-07-25: 1 [drp] via OPHTHALMIC

## 2023-07-25 MED ORDER — LACTATED RINGERS IV SOLN: INTRAVENOUS | Status: DC

## 2023-07-25 MED ORDER — MIDAZOLAM HCL 2 MG/2ML IJ SOLN
INTRAMUSCULAR | Status: DC | PRN
  Administered 2023-07-25 (×2): 1 mg via INTRAVENOUS

## 2023-07-25 MED ORDER — LIDOCAINE HCL (PF) 2 % IJ SOLN
INTRAOCULAR | Status: DC | PRN
  Administered 2023-07-25: 4 mL via INTRAOCULAR

## 2023-07-25 MED ORDER — MOXIFLOXACIN HCL 0.5 % OP SOLN
OPHTHALMIC | Status: DC | PRN
  Administered 2023-07-25: .2 mL via OPHTHALMIC

## 2023-07-25 MED ORDER — SIGHTPATH DOSE#1 BSS IO SOLN
INTRAOCULAR | Status: DC | PRN
  Administered 2023-07-25: 64 mL via OPHTHALMIC

## 2023-07-25 MED ORDER — MIDAZOLAM HCL 2 MG/2ML IJ SOLN
INTRAMUSCULAR | Status: AC
Start: 2023-07-25 — End: 2023-07-25
  Filled 2023-07-25: qty 2

## 2023-07-25 MED ORDER — SIGHTPATH DOSE#1 NA HYALUR & NA CHOND-NA HYALUR IO KIT
PACK | INTRAOCULAR | Status: DC | PRN
  Administered 2023-07-25: 1 via OPHTHALMIC

## 2023-07-25 MED ORDER — ARMC OPHTHALMIC DILATING DROPS
1.0000 | OPHTHALMIC | Status: DC | PRN
  Administered 2023-07-25 (×2): 1 via OPHTHALMIC

## 2023-07-25 MED ORDER — SIGHTPATH DOSE#1 BSS IO SOLN
INTRAOCULAR | Status: DC | PRN
  Administered 2023-07-25: 15 mL via INTRAOCULAR

## 2023-07-25 MED ORDER — TETRACAINE HCL 0.5 % OP SOLN
1.0000 [drp] | OPHTHALMIC | Status: DC | PRN
  Administered 2023-07-25 (×3): 1 [drp] via OPHTHALMIC

## 2023-07-25 SURGICAL SUPPLY — 11 items
CATARACT SUITE SIGHTPATH (MISCELLANEOUS) ×1 IMPLANT
DISSECTOR HYDRO NUCLEUS 50X22 (MISCELLANEOUS) ×1 IMPLANT
DRSG TEGADERM 2-3/8X2-3/4 SM (GAUZE/BANDAGES/DRESSINGS) ×1 IMPLANT
FEE CATARACT SUITE SIGHTPATH (MISCELLANEOUS) ×1 IMPLANT
GLOVE BIOGEL PI IND STRL 8 (GLOVE) ×1 IMPLANT
GLOVE SURG LX STRL 7.5 STRW (GLOVE) ×1 IMPLANT
GLOVE SURG SYN 6.5 PF PI BL (GLOVE) ×1 IMPLANT
LENS IOL TECNIS MONO 18.5 (Intraocular Lens) IMPLANT
NDL FILTER BLUNT 18X1 1/2 (NEEDLE) ×1 IMPLANT
NEEDLE FILTER BLUNT 18X1 1/2 (NEEDLE) ×1 IMPLANT
SYR 3ML LL SCALE MARK (SYRINGE) ×1 IMPLANT

## 2023-07-25 NOTE — Anesthesia Postprocedure Evaluation (Signed)
 Anesthesia Post Note  Patient: Brenda Craig  Procedure(s) Performed: PHACOEMULSIFICATION, CATARACT, WITH IOL INSERTION 6.22 00:43.1 (Right: Eye)  Patient location during evaluation: PACU Anesthesia Type: MAC Level of consciousness: awake and alert Pain management: pain level controlled Vital Signs Assessment: post-procedure vital signs reviewed and stable Respiratory status: spontaneous breathing, nonlabored ventilation, respiratory function stable and patient connected to nasal cannula oxygen Cardiovascular status: stable and blood pressure returned to baseline Postop Assessment: no apparent nausea or vomiting Anesthetic complications: no   No notable events documented.   Last Vitals:  Vitals:   07/25/23 1055 07/25/23 1059  BP: (!) 86/59 94/65  Pulse: 72   Resp: 11 16  Temp:    SpO2: 91% 91%    Last Pain:  Vitals:   07/25/23 1059  TempSrc:   PainSc: 0-No pain                 [name]

## 2023-07-25 NOTE — Op Note (Signed)
 OPERATIVE NOTE  Brenda Craig [id number] 07/25/2023   PREOPERATIVE DIAGNOSIS: Nuclear sclerotic cataract right eye. H25.11   POSTOPERATIVE DIAGNOSIS: Nuclear sclerotic cataract right eye. H25.11   PROCEDURE:  Phacoemusification with posterior chamber intraocular lens placement of the right eye  Ultrasound time: Procedure(s): PHACOEMULSIFICATION, CATARACT, WITH IOL INSERTION 6.22 00:43.1 (Right)  LENS:   Implant Name Type Inv. Item Serial No. Manufacturer Lot No. LRB No. Used Action  LENS IOL TECNIS MONO 18.5 - [id number] Intraocular Lens LENS IOL TECNIS MONO 18.5 [id number] [employer]  Right 1 Implanted      SURGEON:  Feliciano HERO. [place], MD   ANESTHESIA:  Topical with tetracaine  drops, augmented with 1% preservative-free intracameral lidocaine .   COMPLICATIONS:  None.   DESCRIPTION OF PROCEDURE:  The patient was identified in the holding room and transported to the operating room and placed in the supine position under the operating microscope.  The right eye was identified as the operative eye, which was prepped and draped in the usual sterile ophthalmic fashion.   A 1 millimeter clear-corneal paracentesis was made superotemporally. Preservative-free 1% lidocaine  mixed with 1:1,000 bisulfite-free aqueous solution of epinephrine  was injected into the anterior chamber. The anterior chamber was then filled with Viscoat viscoelastic. A 2.4 millimeter keratome was used to make a clear-corneal incision inferotemporally. A curvilinear capsulorrhexis was made with a cystotome and capsulorrhexis forceps. Balanced salt solution was used to hydrodissect and hydrodelineate the nucleus. Phacoemulsification was then used to remove the lens nucleus and epinucleus. The remaining cortex was then removed using the irrigation and aspiration handpiece. Provisc was then placed into the capsular bag to distend it for lens placement. A +18.50 D [id number] intraocular lens was then injected into the capsular bag.  The remaining viscoelastic was aspirated.   Wounds were hydrated with balanced salt solution.  The anterior chamber was inflated to a physiologic pressure with balanced salt solution.  No wound leaks were noted. Moxifloxacin  was injected intracamerally.  Timolol  and Brimonidine  drops were applied to the eye.  The patient was taken to the recovery room in stable condition without complications of anesthesia or surgery.  Feliciano Hugger [place] 07/25/2023, 10:51 AM

## 2023-07-25 NOTE — H&P (Signed)
 [facility]   Primary Care Physician:  Corlis Honor BROCKS, MD Ophthalmologist: Dr. Feliciano Ober  Pre-Procedure History & Physical: HPI:  Brenda Craig is a 63 y.o. female here for cataract surgery.   Past Medical History:  Diagnosis Date   Abnormal Pap smear of cervix 1988   Acid reflux 01/17/2013   Allergic rhinitis    Anemia    Anginal pain (HCC)    Anxiety    Arthritis    Asthma    Bronchiectasis (HCC)    Celiac disease    Cervical dysplasia 1988   CHF (congestive heart failure) (HCC)    Chronic kidney disease    Complication of anesthesia    bp drops after anesthesia and has to be kept overnight; O2 drops; rash   Coronary artery disease    Cough variant asthma    Grade I diastolic dysfunction    Headache    migraine with visual aura   Heart trouble    History of cardiac arrest    History of hiatal hernia    History of kidney stones    History of mammogram 03/25/2013; 12/07/14   BIRADS 1; NEG   History of Papanicolaou smear of cervix 03/20/12; 12/28/15   -/-; -/-   Hypercholesteremia    Hypertension    IBS (irritable bowel syndrome)    Menopausal symptoms    MI (myocardial infarction) (HCC)    Mitral valve prolapse    Pneumonia    PONV (postoperative nausea and vomiting)    nausea only   Sleep apnea    Vulvovaginitis    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    Past Surgical History:  Procedure Laterality Date   ABLATION  2013   CCK   BACK SURGERY     BACK SURGERY  2016; 2017   L4, L5   CARDIAC SURGERY  03/19/12; 10/2014   HEART CATH AND STENT   CESAREAN SECTION     1986; 1989   COLD KNIFE CONE BIOPSY  1988   COLONOSCOPY  08/2010   16 POLYPS (ALL BENIGN) DX C CELIAC DISEASE   COLONOSCOPY N/A 02/17/2021   Procedure: COLONOSCOPY;  Surgeon: Maryruth Ole DASEN, MD;  Location: [facility] ENDOSCOPY;  Service: Endoscopy;  Laterality: N/A;   COMBINED HYSTEROSCOPY DIAGNOSTIC / D&C  2013   CCK   CYSTOSCOPY W/ RETROGRADES Left 01/20/2019    Procedure: CYSTOSCOPY WITH RETROGRADE PYELOGRAM;  Surgeon: Twylla Glendia BROCKS, MD;  Location: [facility] ORS;  Service: Urology;  Laterality: Left;   CYSTOSCOPY W/ URETERAL STENT PLACEMENT Left 12/13/2018   Procedure: CYSTOSCOPY WITH RETROGRADE PYELOGRAM/URETERAL STENT PLACEMENT;  Surgeon: Watt Rush, MD;  Location: [facility] ORS;  Service: Urology;  Laterality: Left;   CYSTOSCOPY/URETEROSCOPY/HOLMIUM LASER/STENT PLACEMENT Left 01/20/2019   Procedure: CYSTOSCOPY/URETEROSCOPY/HOLMIUM LASER/STENT EXCHANGE;  Surgeon: Twylla Glendia BROCKS, MD;  Location: [facility] ORS;  Service: Urology;  Laterality: Left;   DILATION AND CURETTAGE OF UTERUS  2001   ESOPHAGOGASTRODUODENOSCOPY  08/2010   DX C CELIAC DISEASE   ESOPHAGOGASTRODUODENOSCOPY N/A 02/17/2021   Procedure: ESOPHAGOGASTRODUODENOSCOPY (EGD);  Surgeon: Maryruth Ole DASEN, MD;  Location: [facility];  Service: Endoscopy;  Laterality: N/A;   HEART STENTS  05/27/2011   HIP SURGERY  2016   RIGHT IT BAND AND BURSA REMOVAL   LEFT HEART CATH AND CORONARY ANGIOGRAPHY N/A 02/21/2023   Procedure: LEFT HEART CATH AND CORONARY ANGIOGRAPHY;  Surgeon: Anner Alm ORN, MD;  Location: [facility] INVASIVE CV LAB;  Service: Cardiovascular;  Laterality: N/A;  OOPHORECTOMY Right 1982   TOTAL KNEE ARTHROPLASTY Left 01/19/2020   Procedure: TOTAL KNEE ARTHROPLASTY;  Surgeon: Beverley Evalene BIRCH, MD;  Location: [facility] OR[facility];  Service: Orthopedics;  Laterality: Left;   TOTAL KNEE REVISION Left 03/07/2022   Procedure: TOTAL KNEE REVISION;  Surgeon: Edna Toribio LABOR, MD;  Location: [facility];  Service: Orthopedics;  Laterality: Left;    Prior to Admission medications   Medication Sig Start Date End Date Taking? Authorizing Provider  acetaminophen  (TYLENOL ) 500 MG tablet Take 1,000 mg by mouth as needed for moderate pain or headache.    [provider]  albuterol  (VENTOLIN  HFA) 108 (90 Base) MCG/ACT inhaler Inhale 2 puffs into the lungs as needed for wheezing or shortness of breath.     [provider]  aspirin  EC 81 MG tablet Take 81 mg by mouth daily. Swallow whole.    [provider]  benzonatate  (TESSALON ) 200 MG capsule Take 200 mg by mouth 3 (three) times daily as needed. 04/22/23   [provider]  cetirizine  (ZYRTEC ) 10 MG tablet TAKE 1 TABLET BY MOUTH EVERYDAY AT BEDTIME 02/15/23   Kasa, Kurian, MD  clobetasol  ointment (TEMOVATE ) 0.05 % Apply to affected area every night for 4 weeks, then every other day for 4 weeks and then 1-2 times a week for maintenance 10/02/22   Copland, Alicia B, PA-C  diphenhydrAMINE  (BENADRYL  ALLERGY ) 25 MG tablet Take 25 mg by mouth as needed for itching.    [provider]  famotidine  (PEPCID ) 40 MG tablet Take 40 mg by mouth at bedtime. 12/24/18   [provider]  fluconazole  (DIFLUCAN ) 150 MG tablet Take by mouth. As needed 04/22/23   [provider]  Fluticasone -Umeclidin-Vilant (TRELEGY ELLIPTA ) 200-62.5-25 MCG/ACT AEPB Inhale 1 puff into the lungs daily. 05/01/23   Kasa, Kurian, MD  LORazepam  (ATIVAN ) 1 MG tablet Take 1 mg by mouth 2 (two) times daily. 11/14/12   [provider]  losartan  (COZAAR ) 25 MG tablet Take 1 tablet (25 mg total) by mouth daily. 03/08/23   Gollan, Timothy J, MD  Melatonin 5 MG CAPS Take 5 mg by mouth at bedtime.    [provider]  montelukast  (SINGULAIR ) 10 MG tablet Take 10 mg by mouth daily. 06/30/21   [provider]  nebivolol  (BYSTOLIC ) 5 MG tablet Take 1 tablet (5 mg total) by mouth daily with supper. 03/08/23   Gollan, Timothy J, MD  nitroGLYCERIN  (NITROSTAT ) 0.4 MG SL tablet Place 1 tablet (0.4 mg total) under the tongue every 5 (five) minutes as needed for chest pain. 03/08/23   Gollan, Timothy J, MD  nystatin cream (MYCOSTATIN) Apply 1 Application topically as needed (rash). 01/09/22   [provider]  pantoprazole  (PROTONIX ) 20 MG tablet Take 40 mg by mouth daily. 05/25/21   [provider]  predniSONE  (DELTASONE ) 10 MG  tablet Take by mouth. Patient not taking: Reported on 07/17/2023 04/22/23   [provider]  rosuvastatin  (CRESTOR ) 10 MG tablet Take 10 mg by mouth at bedtime. 05/28/20   [provider]  triamcinolone  cream (KENALOG ) 0.1 % Apply 1 Application topically as needed (break out). 01/09/22   [provider]    Allergies as of 05/21/2023 - Review Complete 03/08/2023  Allergen Reaction Noted   Other Other (See Comments) 12/26/2018   Codeine  Nausea And Vomiting and Rash 12/01/2012   Esomeprazole magnesium  Palpitations and Other (See Comments) 07/22/2013   Iodinated contrast media Itching 01/22/2020   Omeprazole magnesium  Palpitations and Other (See Comments) 07/22/2013  Percocet [oxycodone -acetaminophen ] Itching, Nausea And Vomiting, and Rash 10/08/2014   Tramadol  Rash 01/06/2019    Family History  Problem Relation Age of Onset   CAD Father    Transient ischemic attack Father    Diabetes Father    Cerebrovascular Accident Father    Heart disease Father        M-MVP; F BETA;BLOCKERS   Hypothyroidism Father    Hyperthyroidism Mother    Cancer Maternal Uncle        KIDNEY   Uterine cancer Maternal Aunt 46    Social History   Socioeconomic History   Marital status: Divorced    Spouse name: Not on file   Number of children: 2   Years of education: 16   Highest education level: Not on file  Occupational History   Occupation: TEACHER  Tobacco Use   Smoking status: Never    Passive exposure: Never   Smokeless tobacco: Never  Vaping Use   Vaping status: Never Used  Substance and Sexual Activity   Alcohol  use: Not Currently    Comment: occasionally   Drug use: No   Sexual activity: Not Currently    Birth control/protection: Surgical  Other Topics Concern   Not on file  Social History Narrative   Not on file   Social Drivers of Health   Financial Resource Strain: Not on file  Food Insecurity: No Food Insecurity (02/20/2023)   Hunger Vital Sign     Worried About Running Out of Food in the Last Year: Never true    Ran Out of Food in the Last Year: Never true  Transportation Needs: No Transportation Needs (02/20/2023)   PRAPARE - Administrator, Civil Service (Medical): No    Lack of Transportation (Non-Medical): No  Physical Activity: Not on file  Stress: Not on file  Social Connections: Not on file  Intimate Partner Violence: Not At Risk (02/20/2023)   Humiliation, Afraid, Rape, and Kick questionnaire    Fear of Current or Ex-Partner: No    Emotionally Abused: No    Physically Abused: No    Sexually Abused: No    Review of Systems: See HPI, otherwise negative ROS  Physical Exam: Ht 5' 5 (1.651 m)   Wt 104.3 kg   BMI 38.27 kg/m  General:   Alert, cooperative in NAD Head:  Normocephalic and atraumatic. Respiratory:  Normal work of breathing. Cardiovascular:  RRR  Impression/Plan: Brenda Craig is here for cataract surgery.  Risks, benefits, limitations, and alternatives regarding cataract surgery have been reviewed with the patient.  Questions have been answered.  All parties agreeable.   Feliciano Bryan Ober, MD  07/25/2023, 9:06 AM

## 2023-07-25 NOTE — Transfer of Care (Signed)
 Immediate Anesthesia Transfer of Care Note  Patient: Brenda Craig  Procedure(s) Performed: PHACOEMULSIFICATION, CATARACT, WITH IOL INSERTION 6.22 00:43.1 (Right: Eye)  Patient Location: PACU  Anesthesia Type:MAC  Level of Consciousness: awake, alert , and oriented  Airway & Oxygen Therapy: Patient Spontanous Breathing  Post-op Assessment: Report given to RN and Patient moving all extremities  Post vital signs: Reviewed and stable  Last Vitals:  Vitals Value Taken Time  BP    Temp    Pulse    Resp    SpO2      Last Pain:  Vitals:   07/25/23 0928  TempSrc: Temporal  PainSc: 0-No pain         Complications: No notable events documented.

## 2023-07-26 ENCOUNTER — Encounter: Payer: Self-pay | Admitting: Ophthalmology

## 2023-07-29 ENCOUNTER — Inpatient Hospital Stay: Attending: Oncology

## 2023-07-29 DIAGNOSIS — Z79899 Other long term (current) drug therapy: Secondary | ICD-10-CM | POA: Diagnosis not present

## 2023-07-29 DIAGNOSIS — Z8349 Family history of other endocrine, nutritional and metabolic diseases: Secondary | ICD-10-CM | POA: Insufficient documentation

## 2023-07-29 DIAGNOSIS — E538 Deficiency of other specified B group vitamins: Secondary | ICD-10-CM | POA: Insufficient documentation

## 2023-07-29 DIAGNOSIS — E611 Iron deficiency: Secondary | ICD-10-CM | POA: Insufficient documentation

## 2023-07-29 DIAGNOSIS — Z8249 Family history of ischemic heart disease and other diseases of the circulatory system: Secondary | ICD-10-CM | POA: Insufficient documentation

## 2023-07-29 DIAGNOSIS — Z809 Family history of malignant neoplasm, unspecified: Secondary | ICD-10-CM | POA: Insufficient documentation

## 2023-07-29 DIAGNOSIS — Z833 Family history of diabetes mellitus: Secondary | ICD-10-CM | POA: Diagnosis not present

## 2023-07-29 DIAGNOSIS — Z8049 Family history of malignant neoplasm of other genital organs: Secondary | ICD-10-CM | POA: Diagnosis not present

## 2023-07-29 MED ORDER — CYANOCOBALAMIN 1000 MCG/ML IJ SOLN
1000.0000 ug | Freq: Once | INTRAMUSCULAR | Status: AC
  Administered 2023-07-29: 1000 ug via INTRAMUSCULAR
  Filled 2023-07-29: qty 1

## 2023-08-30 ENCOUNTER — Inpatient Hospital Stay

## 2023-08-30 ENCOUNTER — Inpatient Hospital Stay: Attending: Oncology

## 2023-08-30 DIAGNOSIS — E611 Iron deficiency: Secondary | ICD-10-CM | POA: Diagnosis present

## 2023-08-30 DIAGNOSIS — Z79899 Other long term (current) drug therapy: Secondary | ICD-10-CM | POA: Diagnosis not present

## 2023-08-30 DIAGNOSIS — E538 Deficiency of other specified B group vitamins: Secondary | ICD-10-CM | POA: Diagnosis not present

## 2023-08-30 MED ORDER — CYANOCOBALAMIN 1000 MCG/ML IJ SOLN
1000.0000 ug | Freq: Once | INTRAMUSCULAR | Status: AC
  Administered 2023-08-30: 1000 ug via INTRAMUSCULAR
  Filled 2023-08-30: qty 1

## 2023-09-02 ENCOUNTER — Telehealth: Payer: Self-pay

## 2023-09-02 DIAGNOSIS — E538 Deficiency of other specified B group vitamins: Secondary | ICD-10-CM

## 2023-09-02 NOTE — Telephone Encounter (Signed)
-----   Message from Nurse Roxie E sent at 08/30/2023  3:55 PM EDT ----- Patient would like to know if she can have a lab only appt sometime in or around December to check b12 levels.

## 2023-09-02 NOTE — Telephone Encounter (Signed)
 Please schedule Lab (b12 only) approx 4 weeks after last B12 inj (11/24).

## 2023-09-30 ENCOUNTER — Inpatient Hospital Stay

## 2023-09-30 ENCOUNTER — Inpatient Hospital Stay: Attending: Oncology

## 2023-09-30 DIAGNOSIS — E538 Deficiency of other specified B group vitamins: Secondary | ICD-10-CM | POA: Diagnosis not present

## 2023-09-30 DIAGNOSIS — E611 Iron deficiency: Secondary | ICD-10-CM | POA: Diagnosis present

## 2023-09-30 MED ORDER — CYANOCOBALAMIN 1000 MCG/ML IJ SOLN
1000.0000 ug | Freq: Once | INTRAMUSCULAR | Status: AC
  Administered 2023-09-30: 1000 ug via INTRAMUSCULAR
  Filled 2023-09-30: qty 1

## 2023-10-01 ENCOUNTER — Ambulatory Visit: Admitting: Obstetrics and Gynecology

## 2023-10-03 ENCOUNTER — Encounter: Payer: Self-pay | Admitting: Family Medicine

## 2023-10-03 ENCOUNTER — Ambulatory Visit: Admitting: Family Medicine

## 2023-10-03 VITALS — BP 105/79 | HR 71 | Temp 98.8°F | Ht 65.0 in

## 2023-10-03 DIAGNOSIS — E611 Iron deficiency: Secondary | ICD-10-CM

## 2023-10-03 DIAGNOSIS — F419 Anxiety disorder, unspecified: Secondary | ICD-10-CM

## 2023-10-03 DIAGNOSIS — I10 Essential (primary) hypertension: Secondary | ICD-10-CM | POA: Diagnosis not present

## 2023-10-03 DIAGNOSIS — J4550 Severe persistent asthma, uncomplicated: Secondary | ICD-10-CM | POA: Diagnosis not present

## 2023-10-03 DIAGNOSIS — G4733 Obstructive sleep apnea (adult) (pediatric): Secondary | ICD-10-CM

## 2023-10-03 DIAGNOSIS — N281 Cyst of kidney, acquired: Secondary | ICD-10-CM

## 2023-10-03 DIAGNOSIS — E538 Deficiency of other specified B group vitamins: Secondary | ICD-10-CM

## 2023-10-03 DIAGNOSIS — M170 Bilateral primary osteoarthritis of knee: Secondary | ICD-10-CM

## 2023-10-03 DIAGNOSIS — K219 Gastro-esophageal reflux disease without esophagitis: Secondary | ICD-10-CM

## 2023-10-03 DIAGNOSIS — L900 Lichen sclerosus et atrophicus: Secondary | ICD-10-CM | POA: Insufficient documentation

## 2023-10-03 DIAGNOSIS — K900 Celiac disease: Secondary | ICD-10-CM

## 2023-10-03 DIAGNOSIS — I2510 Atherosclerotic heart disease of native coronary artery without angina pectoris: Secondary | ICD-10-CM

## 2023-10-03 DIAGNOSIS — E782 Mixed hyperlipidemia: Secondary | ICD-10-CM

## 2023-10-03 DIAGNOSIS — R053 Chronic cough: Secondary | ICD-10-CM | POA: Insufficient documentation

## 2023-10-03 MED ORDER — HYDROCOD POLI-CHLORPHE POLI ER 10-8 MG/5ML PO SUER: 5.0000 mL | Freq: Two times a day (BID) | ORAL | 0 refills | Status: AC

## 2023-10-03 MED ORDER — PREDNISONE 20 MG PO TABS: 40.0000 mg | ORAL_TABLET | Freq: Every day | ORAL | 0 refills | Status: AC

## 2023-10-03 NOTE — Patient Instructions (Signed)
 To keep you healthy, please keep in mind the following health maintenance items that you are due for:   Health Maintenance Due  Topic Date Due   Mammogram  10/20/2023     Best Wishes,   Dr. Lang

## 2023-10-03 NOTE — Progress Notes (Unsigned)
 New patient visit   Patient: Brenda Craig   DOB: Nov 16, 1960   63 y.o. Female  MRN: [id number] Visit Date: 10/03/2023  Today's healthcare provider: Rockie Agent, MD   Chief Complaint  Patient presents with  . Establish Care    Patient presents to establish care with new pcp.   . Cough    Patient has cough x 2-3 weeks. Has tested negative 2x for covid. Mostly not productive, some congestion/ headache. No fever body aches or chills   . Obesity    Patient is wanting to discuss weight loss, states she has something she wants to try called slyderix patient wanted to go over ingredient list to make sure they don't interact with her meds    Subjective    Brenda Craig is a 63 y.o. female who presents today as a new patient to establish care.   HPI     Establish Care    Additional comments: Patient presents to establish care with new pcp.         Cough    Additional comments: Patient has cough x 2-3 weeks. Has tested negative 2x for covid. Mostly not productive, some congestion/ headache. No fever body aches or chills         Obesity    Additional comments: Patient is wanting to discuss weight loss, states she has something she wants to try called slyderix patient wanted to go over ingredient list to make sure they don't interact with her meds       Last edited by Cherry Chiquita HERO, CMA on 10/03/2023  3:58 PM.       Discussed the use of AI scribe software for clinical note transcription with the patient, who gave verbal consent to proceed.  History of Present Illness       Past Medical History:  Diagnosis Date  . Abnormal Pap smear of cervix 1988  . Acid reflux 01/17/2013  . Acute respiratory failure with hypoxia (HCC) 02/19/2023  . Allergic rhinitis   . Allergy    . Anemia   . Anginal pain   . Anxiety   . Arthritis   . Asthma   . Bronchiectasis (HCC)   . Cataract   . Celiac disease   . Cervical dysplasia 1988  . CHF (congestive  heart failure) (HCC)   . Chronic kidney disease   . Chronic respiratory failure with hypoxia (HCC) 05/10/2020  . Complication of anesthesia    bp drops after anesthesia and has to be kept overnight; O2 drops; rash  . COPD (chronic obstructive pulmonary disease) (HCC)   . Coronary artery disease   . Cough variant asthma   . Grade I diastolic dysfunction   . Headache    migraine with visual aura  . Heart trouble   . History of cardiac arrest   . History of hiatal hernia   . History of kidney stones   . History of mammogram 03/25/2013; 12/07/14   BIRADS 1; NEG  . History of Papanicolaou smear of cervix 03/20/12; 12/28/15   -/-; -/-  . Hypercholesteremia   . Hypertension   . IBS (irritable bowel syndrome)   . Menopausal symptoms   . MI (myocardial infarction) (HCC)   . Mitral valve prolapse   . Pneumonia   . Pneumonia 05/10/2020  . PONV (postoperative nausea and vomiting)    nausea only  . Septic shock (HCC) 12/13/2018  . Severe sepsis (HCC) 12/13/2018  . Sleep apnea   . 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 acetaminophen  (TYLENOL ) 500 MG tablet Take 1,000 mg by mouth as needed for moderate pain or headache.  . albuterol  (VENTOLIN  HFA) 108 (90 Base) MCG/ACT inhaler Inhale 2 puffs into the lungs as needed for wheezing or shortness of breath.  . aspirin  EC 81 MG tablet Take 81 mg by mouth daily. Swallow whole.  . benzonatate  (TESSALON ) 200 MG capsule Take 200 mg by mouth 3 (three) times daily as needed.  . clobetasol  ointment (TEMOVATE ) 0.05 % Apply to affected area every night for 4 weeks, then every other day for 4 weeks and then 1-2 times a week for maintenance  . diphenhydrAMINE  (BENADRYL  ALLERGY ) 25 MG tablet Take 25 mg by mouth as needed for itching.  . famotidine  (PEPCID ) 40 MG tablet Take 40 mg by mouth at bedtime.  . fluconazole  (DIFLUCAN ) 150 MG tablet Take by mouth. As  needed  . Fluticasone -Umeclidin-Vilant (TRELEGY ELLIPTA ) 200-62.5-25 MCG/ACT AEPB Inhale 1 puff into the lungs daily.  . LORazepam  (ATIVAN ) 1 MG tablet Take 1 mg by mouth 2 (two) times daily.  . losartan  (COZAAR ) 25 MG tablet Take 1 tablet (25 mg total) by mouth daily.  . Melatonin 5 MG CAPS Take 5 mg by mouth at bedtime.  . montelukast  (SINGULAIR ) 10 MG tablet Take 10 mg by mouth daily.  . nebivolol  (BYSTOLIC ) 5 MG tablet Take 1 tablet (5 mg total) by mouth daily with supper.  . nitroGLYCERIN  (NITROSTAT ) 0.4 MG SL tablet Place 1 tablet (0.4 mg total) under the tongue every 5 (five) minutes as needed for chest pain.  SABRA nystatin cream (MYCOSTATIN) Apply 1 Application topically as needed (rash).  . pantoprazole  (PROTONIX ) 20 MG tablet Take 40 mg by mouth daily.  . rosuvastatin  (CRESTOR ) 10 MG tablet Take 10 mg by mouth at bedtime.  . triamcinolone  cream (KENALOG ) 0.1 % Apply 1 Application topically as needed (break out).  . [DISCONTINUED] chlorpheniramine-HYDROcodone  (TUSSIONEX) 10-8 MG/5ML Take 5 mLs by mouth 2 (two) times daily.  . [DISCONTINUED] cetirizine  (ZYRTEC ) 10 MG tablet TAKE 1 TABLET BY MOUTH EVERYDAY AT BEDTIME  . [DISCONTINUED] predniSONE  (DELTASONE ) 10 MG tablet Take by mouth. (Patient not taking: Reported on 07/17/2023)   No facility-administered medications prior to visit.    Past Surgical History:  Procedure Laterality Date  . ABLATION  2013   CCK  . BACK SURGERY    . BACK SURGERY  2016; 2017   L4, L5  . CARDIAC SURGERY  03/19/12; 10/2014   HEART CATH AND STENT  . CATARACT EXTRACTION W/PHACO Right 07/25/2023   Procedure: PHACOEMULSIFICATION, CATARACT, WITH IOL INSERTION 6.22 00:43.1;  Surgeon: Enola Feliciano Hugger, MD;  Location: [facility] ORS;  Service: Ophthalmology;  Laterality: Right;  . CESAREAN SECTION     1986; 1989  . COLD KNIFE CONE BIOPSY  1988  . COLONOSCOPY  08/2010   16 POLYPS (ALL BENIGN) DX C CELIAC DISEASE  . COLONOSCOPY N/A 02/17/2021   Procedure:  COLONOSCOPY;  Surgeon: Maryruth Ole DASEN, MD;  Location: [facility] ENDOSCOPY;  Service: Endoscopy;  Laterality: N/A;  . COMBINED HYSTEROSCOPY DIAGNOSTIC / D&C  2013   CCK  . CYSTOSCOPY W/ RETROGRADES Left 01/20/2019   Procedure: CYSTOSCOPY WITH RETROGRADE PYELOGRAM;  Surgeon: Twylla Glendia BROCKS, MD;  Location: [facility] ORS;  Service: Urology;  Laterality: Left;  . CYSTOSCOPY W/ URETERAL STENT PLACEMENT Left 12/13/2018   Procedure: CYSTOSCOPY WITH RETROGRADE PYELOGRAM/URETERAL STENT PLACEMENT;  Surgeon: Watt Rush, MD;  Location: [facility] ORS;  Service: Urology;  Laterality: Left;  . CYSTOSCOPY/URETEROSCOPY/HOLMIUM LASER/STENT PLACEMENT Left 01/20/2019   Procedure: CYSTOSCOPY/URETEROSCOPY/HOLMIUM LASER/STENT EXCHANGE;  Surgeon: Twylla Glendia BROCKS, MD;  Location: [facility] ORS;  Service: Urology;  Laterality: Left;  . DILATION AND CURETTAGE OF UTERUS  2001  . ESOPHAGOGASTRODUODENOSCOPY  08/2010   DX C CELIAC DISEASE  . ESOPHAGOGASTRODUODENOSCOPY N/A 02/17/2021   Procedure: ESOPHAGOGASTRODUODENOSCOPY (EGD);  Surgeon: Maryruth Ole DASEN, MD;  Location: [facility] ENDOSCOPY;  Service: Endoscopy;  Laterality: N/A;  . HEART STENTS  05/27/2011  . HIP SURGERY  2016   RIGHT IT BAND AND BURSA REMOVAL  . JOINT REPLACEMENT  03/08/2023   2nd time on left leg  . LEFT HEART CATH AND CORONARY ANGIOGRAPHY N/A 02/21/2023   Procedure: LEFT HEART CATH AND CORONARY ANGIOGRAPHY;  Surgeon: Anner Alm ORN, MD;  Location: [facility] INV[facility]IVE CV LAB;  Service: Cardiovascular;  Laterality: N/A;  . OOPHORECTOMY Right 1982  . SPINE SURGERY     Laminectory 3x  . TOTAL KNEE ARTHROPLASTY Left 01/19/2020   Procedure: TOTAL KNEE ARTHROPLASTY;  Surgeon: Beverley Evalene BIRCH, MD;  Location: [facility];  Service: Orthopedics;  Laterality: Left;  . TOTAL KNEE REVISION Left 03/07/2022   Procedure: TOTAL KNEE REVISION;  Surgeon: Edna Toribio LABOR, MD;  Location: [facility];  Service: Orthopedics;  Laterality: Left;   Family Status  Relation Name Status  . Father  Dad Deceased  . Mother June Alive  . Mat [employer] Deceased  . Mat [employer]  . MGF [employer]  . PGM Sarah Alive  . Son Elsie Searles  No partnership data on file   Family History  Problem Relation Age of Onset  . CAD Father   . Transient ischemic attack Father   . Diabetes Father   . Cerebrovascular Accident Father   . Heart disease Father        M-MVP; F BETA;BLOCKERS  . Hypothyroidism Father   . Hyperthyroidism Mother   . COPD Mother   . Cancer Maternal Uncle        KIDNEY  . Uterine cancer Maternal Aunt 69  . Heart disease Maternal Grandfather   . Heart disease Paternal Grandmother   . Learning disabilities Son    Social History   Socioeconomic History  . Marital status: Divorced    Spouse name: Not on file  . Number of children: 2  . Years of education: 14  . Highest education level: Professional school degree (e.g., MD, DDS, DVM, JD)  Occupational History  . Occupation: TEACHER  Tobacco Use  . Smoking status: Never    Passive exposure: Never  . Smokeless tobacco: Never  Vaping Use  . Vaping status: Never Used  Substance and Sexual Activity  . Alcohol  use: Not Currently    Comment: occasionally  . Drug use: No  . Sexual activity: Not Currently    Birth control/protection: Surgical  Other Topics Concern  . Not on file  Social History Narrative  . Not on file   Social Drivers of Health   Financial Resource Strain: Medium Risk (10/01/2023)   Overall Financial Resource Strain (CARDIA)   . Difficulty of Paying Living Expenses: Somewhat hard  Food Insecurity: No Food Insecurity (10/01/2023)   Hunger Vital Sign   . Worried About Programme researcher, broadcasting/film/video in the Last Year: Never true   . Ran Out of Food in the Last Year: Never true  Transportation Needs: No Transportation Needs (10/01/2023)   PRAPARE -  Transportation   . Lack of Transportation (Medical): No   . Lack of Transportation (Non-Medical): No  Physical Activity: Inactive (10/01/2023)   Exercise  Vital Sign   . Days of Exercise per Week: 0 days   . Minutes of Exercise per Session: Not on file  Stress: Stress Concern Present (10/01/2023)   [employer] of Occupational Health - Occupational Stress Questionnaire   . Feeling of Stress: To some extent  Social Connections: Moderately Integrated (10/01/2023)   Social Connection and Isolation Panel   . Frequency of Communication with Friends and Family: Three times a week   . Frequency of Social Gatherings with Friends and Family: Once a week   . Attends Religious Services: More than 4 times per year   . Active Member of Clubs or Organizations: Yes   . Attends Banker Meetings: More than 4 times per year   . Marital Status: Divorced     Allergies  Allergen Reactions  . Other Other (See Comments)    Gluten allergy  Celiac   . Codeine  Nausea And Vomiting and Rash  . Esomeprazole Magnesium  Palpitations and Other (See Comments)    IRREGULAR HEARTBEAT  . Iodinated Contrast Media Itching  . Omeprazole Magnesium  Palpitations and Other (See Comments)    IRREGULAR HEARTBEAT  . Percocet [Oxycodone -Acetaminophen ] Itching, Nausea And Vomiting and Rash    Can take with benadryl    . Tramadol  Rash    Can take with benadryl      Immunization History  Administered Date(s) Administered  . Influenza,inj,Quad PF,6+ Mos 05/25/2013, 11/13/2017, 10/17/2018  . Influenza,trivalent, recombinat, inj, PF 11/16/2022  . Influenza-Unspecified 10/10/2020, 10/08/2021  . PFIZER Comirnaty(Gray Top)Covid-19 Tri-Sucrose Vaccine 05/06/2020  . PFIZER(Purple Top)SARS-COV-2 Vaccination 04/16/2019, 05/07/2019, 12/11/2019  . PPD Test 11/14/2022  . Pneumococcal Polysaccharide-23 12/16/2018    Health Maintenance  Topic Date Due  . Mammogram  10/20/2023  . COVID-19 Vaccine (5 - 2025-26 season) 10/19/2023 (Originally 09/09/2023)  . Zoster Vaccines- Shingrix (1 of 2) 01/02/2024 (Originally 10/29/2009)  . Influenza Vaccine  04/07/2024 (Originally  08/09/2023)  . DTaP/Tdap/Td (1 - Tdap) 10/02/2024 (Originally 10/30/1978)  . Pneumococcal Vaccine: 50+ Years (2 of 2 - PCV) 10/02/2024 (Originally 12/16/2019)  . Hepatitis C Screening  10/02/2024 (Originally 10/29/1977)  . Cervical Cancer Screening (HPV/Pap Cotest)  07/14/2025  . Colonoscopy  02/18/2031  . HIV Screening  Completed  . Hepatitis B Vaccines 19-59 Average Risk  Aged Out  . HPV VACCINES  Aged Out  . Meningococcal B Vaccine  Aged Out    Patient Care Team: Simmons-Robinson, Rockie, MD as PCP - General (Family Medicine) Perla Evalene PARAS, MD as PCP - Cardiology (Cardiology) Babara Call, MD as Consulting Physician (Oncology) Isaiah Scrivener, MD as Consulting Physician (Pulmonary Disease)  Review of Systems  {Insert previous labs (optional):23779} {See past labs  Heme  Chem  Endocrine  Serology  Results Review (optional):1}   Objective    BP 105/79 (BP Location: Right Arm, Patient Position: Sitting, Cuff Size: Normal)   Pulse 71   Temp 98.8 F (37.1 C) (Oral)   Ht 5' 5 (1.651 m)   SpO2 95%   BMI 39.11 kg/m  {Insert last BP/Wt (optional):23777}{See vitals history (optional):1}    Depression Screen    10/03/2023    4:21 PM 04/06/2019    2:27 PM  PHQ 2/9 Scores  PHQ - 2 Score 0 0  PHQ- 9 Score 2    No results found for any visits on 10/03/23.   Physical Exam ***  Assessment & Plan      Problem List Items Addressed This Visit     Acid reflux   Anxiety   B12 deficiency (Chronic)   BP (high blood pressure)   CAD in native artery   Celiac disease   Chronic coughing   Relevant Medications   chlorpheniramine-HYDROcodone  (TUSSIONEX) 10-8 MG/5ML   predniSONE  (DELTASONE ) 20 MG tablet   HLD (hyperlipidemia)   Iron  deficiency (Chronic)   Kidney cysts   Lichen sclerosus   OA (osteoarthritis) of knee   Relevant Medications   predniSONE  (DELTASONE ) 20 MG tablet   OSA (obstructive sleep apnea) - Primary   Severe persistent asthma   Relevant Medications    predniSONE  (DELTASONE ) 20 MG tablet    Assessment and Plan Assessment & Plan       No follow-ups on file.      Rockie Agent, MD  [facility] [phone] (phone) [phone] (fax)  [facility]

## 2023-10-04 ENCOUNTER — Telehealth: Payer: Self-pay

## 2023-10-04 MED ORDER — PROMETHAZINE-DM 6.25-15 MG/5ML PO SYRP: 5.0000 mL | ORAL_SOLUTION | Freq: Four times a day (QID) | ORAL | 0 refills | Status: DC | PRN

## 2023-10-04 MED ORDER — AMOXICILLIN-POT CLAVULANATE 875-125 MG PO TABS: 1.0000 | ORAL_TABLET | Freq: Two times a day (BID) | ORAL | 0 refills | Status: DC

## 2023-10-04 NOTE — Telephone Encounter (Signed)
 New abx sent. Will send alternative cough syrup until next week.

## 2023-10-04 NOTE — Telephone Encounter (Signed)
 Copied from CRM #[id number]. Topic: Clinical - Medication Question >> Oct 04, 2023 11:30 AM Travis F wrote: Reason for CRM: Patient is calling in because she was told by Dr. Lang if she wasn't feeling better and she would call her in some medication aside from what she sent in. Patient is requesting that medication. Patient also wants Dr. Lang to know that the cough medicine will be out of stock until Tuesday. Patient did start the prednisone  this morning.

## 2023-10-04 NOTE — Telephone Encounter (Signed)
 Per DPR, LVM on cell. Advised patient of Dr. Feliberto message. Okay for E2C2 RN to repeat message if patient calls back with any questions

## 2023-10-25 ENCOUNTER — Ambulatory Visit: Admitting: Family Medicine

## 2023-10-25 ENCOUNTER — Ambulatory Visit

## 2023-10-25 VITALS — BP 101/57 | HR 88 | Resp 16 | Ht 65.0 in | Wt 230.0 lb

## 2023-10-25 DIAGNOSIS — M79605 Pain in left leg: Secondary | ICD-10-CM

## 2023-10-25 DIAGNOSIS — B49 Unspecified mycosis: Secondary | ICD-10-CM | POA: Diagnosis not present

## 2023-10-25 DIAGNOSIS — J4551 Severe persistent asthma with (acute) exacerbation: Secondary | ICD-10-CM

## 2023-10-25 MED ORDER — PREDNISONE 20 MG PO TABS: 40.0000 mg | ORAL_TABLET | Freq: Every day | ORAL | 0 refills | Status: AC

## 2023-10-25 NOTE — Progress Notes (Signed)
 Acute visit   Patient: Brenda Craig   DOB: [birth year]   63 y.o. Female  MRN: [id number] PCP: Sharma Coyer, MD   Chief Complaint  Patient presents with   Acute Visit    sore throat ( 3 days )( leg pain 1 wk)  rash around stomach( comes and goes now 2 days)   Subjective    Discussed the use of AI scribe software for clinical note transcription with the patient, who gave verbal consent to proceed.  History of Present Illness Brenda Craig is a 63 year old female with asthma who presents with a sore throat, cough, and fever.  Her symptoms began approximately three days ago with a sore throat described as feeling like 'a razor type thing' in her throat, making it painful to swallow. She also has a chronic cough that worsens when she is sick. She noted a fever this morning, although she typically runs a low-grade fever around 105F. She has been taking Tylenol  every four hours to manage her symptoms.  She attempted a COVID test at home, but the test was faulty as the control did not appear. She was tested for flu and strep, both of which were negative. She has a history of pharyngitis and describes the current throat pain as similar to previous episodes. No sinus congestion or soreness is present.  She experiences a rash under her fat fold when sick or stressed, which she treats with nystatin cream prescribed by a dermatologist.  She has been experiencing leg pain for about a week. The pain is located in the leg where she has had two knee replacements, but it is not consistent with her previous experiences of sciatica. The pain starts from the inside of her leg and stops at the knee replacement. She has tried ice and heat without relief. She has a history of a blood clot in the same leg, but the current pain is higher up than before.  She recently completed a course of antibiotics for a respiratory infection, which did not include a sore throat or severe cough. She  has asthma and has previously used Paxlovid for COVID-19, which she found helpful. She avoids ibuprofen and naproxen due to a history of cardiac arrest and only uses Tylenol  for pain management.  No new breathing difficulties beyond her usual asthma symptoms. She feels achy and sore all over. No sinus congestion or soreness.   Review of systems as noted in HPI.   Objective    BP (!) 101/57 (BP Location: Right Arm, Patient Position: Sitting)   Pulse 88   Resp 16   Ht 5' 5 (1.651 m)   Wt 230 lb (104.3 kg)   SpO2 98%   BMI 38.27 kg/m  Physical Exam Constitutional:      Appearance: Normal appearance.  HENT:     Head: Normocephalic and atraumatic.     Right Ear: Tympanic membrane, ear canal and external ear normal.     Left Ear: Tympanic membrane, ear canal and external ear normal.     Nose: Congestion and rhinorrhea present.     Mouth/Throat:     Mouth: Mucous membranes are moist.     Pharynx: Posterior oropharyngeal erythema present. No oropharyngeal exudate.  Eyes:     Pupils: Pupils are equal, round, and reactive to light.  Cardiovascular:     Rate and Rhythm: Normal rate and regular rhythm.     Heart sounds: Normal heart sounds.  Pulmonary:  Effort: Pulmonary effort is normal.     Breath sounds: Normal breath sounds.  Skin:    General: Skin is warm.  Neurological:     General: No focal deficit present.     Mental Status: She is alert.       No results found for any visits on 10/25/23.  Assessment & Plan     Problem List Items Addressed This Visit   None Visit Diagnoses       Severe persistent asthma with exacerbation (HCC)    -  Primary   Relevant Medications   predniSONE  (DELTASONE ) 20 MG tablet     Fungal infection       Relevant Medications   nystatin cream (MYCOSTATIN)     Leg pain, medial, left           Assessment & Plan Severe persistent asthma with exacerbation (HCC) Patient with 3 day hx of sore throat, cough, and myalgias. Negative for  strep and flu. COVID status uncertain due to lack of supplies in clinic today. Most likely experiencing asthma exacerbation 2/2 viral illness. Less likely sinusitis, strep throat, pneumonia. - Prescribed 5-day course of prednisone  - Continue cough syrup and Mucinex  - Continue Trelegy and albuterol  PRN - Instructed to test for COVID and report if positive. - Consider Paxlovid if COVID positive and symptoms worsen. - Return precautions given  Left leg pain Patient with 1 week hx of left medial upper leg pain from groin to her knee. Ddx includes muscle strain vs. Neuropathy. Less likely DVT. - Prednisone  prescribed as above will likely help with inflammation - Recommend Voltaren gel application. - Advise rest and icing. - Return to care if no improvement after steroids  Recurrent intertriginous candidiasis Recurrent yeast rash under skin folds, managed with nystatin cream. - Refilled nystatin cream prescription.   Meds ordered this encounter  Medications   nystatin cream (MYCOSTATIN)    Sig: Apply 1 Application topically as needed (rash).    Dispense:  30 g    Refill:  0   predniSONE  (DELTASONE ) 20 MG tablet    Sig: Take 2 tablets (40 mg total) by mouth daily with breakfast for 5 days.    Dispense:  10 tablet    Refill:  0     No follow-ups on file.      Isaiah DELENA Pepper, MD  [facility] [phone] (phone) [phone] (fax)

## 2023-10-27 NOTE — Progress Notes (Deleted)
 PCP: Sharma Coyer, MD   No chief complaint on file.   HPI:      [name] is a 63 y.o. [id number] who LMP was No LMP recorded. Patient has had an ablation., presents today for her annual examination.  Her menses are absent due to ablation and she is postmenopausal. No PMB. She has improved vasomotor sx.   Sex activity: not sexually active. She does not have vaginal dryness. Does get vaginal irritation/fissures due to moisture. Treats with lotrisone  crm ext a few times with relief. Vaginal itching worse this yr, now waking at night scratching. 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  Hx of STDs: HPV on pap  Last mammogram: 10/19/21 at [facility].  Results were: normal--routine follow-up in 12 months There is no FH of breast cancer. There is no FH of ovarian cancer. The patient does do self-breast exams.  Colonoscopy: colonoscopy 2023 with [facility];  Repeat due after 7 yrs.   Tobacco use: The patient denies current or previous tobacco use. Alcohol  use: none No drug use Exercise: not active  She does get adequate calcium  and Vitamin D in her diet.  Labs with PCP.  She cont to get fungal infections under panus; saw derm and given 2 creams to use with sx relief. Sx recurred this AM. Pt tries to keep area dry; still has crm at home and started using again this AM.    Past Medical History:  Diagnosis Date   Abnormal Pap smear of cervix 1988   Acid reflux 01/17/2013   Acute respiratory failure with hypoxia (HCC) 02/19/2023   Allergic rhinitis    Allergy     Anemia    Anginal pain    Anxiety    Arthritis    Asthma    Bronchiectasis (HCC)    Cataract    Celiac disease    Cervical dysplasia 1988   CHF (congestive heart failure) (HCC)    Chronic kidney disease    Chronic respiratory failure with hypoxia (HCC) 05/10/2020   Complication of anesthesia    bp drops after anesthesia  and has to be kept overnight; O2 drops; rash   COPD (chronic obstructive pulmonary disease) (HCC)    Coronary artery disease    Cough variant asthma    Grade I diastolic dysfunction    Headache    migraine with visual aura   Heart trouble    History of cardiac arrest    History of hiatal hernia    History of kidney stones    History of mammogram 03/25/2013; 12/07/14   BIRADS 1; NEG   History of Papanicolaou smear of cervix 03/20/12; 12/28/15   -/-; -/-   Hypercholesteremia    Hypertension    IBS (irritable bowel syndrome)    Menopausal symptoms    MI (myocardial infarction) (HCC)    Mitral valve prolapse    Pneumonia    Pneumonia 05/10/2020   PONV (postoperative nausea and vomiting)    nausea only   Septic shock (HCC) 12/13/2018   Severe sepsis (HCC) 12/13/2018   Sleep apnea    Vulvovaginitis    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    Past Surgical History:  Procedure Laterality Date   ABLATION  2013   [facility]   BACK SURGERY     BACK SURGERY  2016; 2017   L4, L5   CARDIAC SURGERY  03/19/12; 10/2014  HEART CATH AND STENT   CATARACT EXTRACTION W/PHACO Right 07/25/2023   Procedure: PHACOEMULSIFICATION, CATARACT, WITH IOL INSERTION 6.22 00:43.1;  Surgeon: Enola Feliciano Hugger, MD;  Location: [facility];  Service: Ophthalmology;  Laterality: Right;   CESAREAN SECTION     1986; 1989   COLD KNIFE CONE BIOPSY  1988   COLONOSCOPY  08/2010   16 POLYPS (ALL BENIGN) DX C CELIAC DISEASE   COLONOSCOPY N/A 02/17/2021   Procedure: COLONOSCOPY;  Surgeon: Maryruth Ole DASEN, MD;  Location: [facility] END[facility];  Service: Endoscopy;  Laterality: N/A;   COMBINED HYSTEROSCOPY DIAGNOSTIC / D&C  2013   [facility]   CYSTOSCOPY W/ RETROGRADES Left 01/20/2019   Procedure: CYSTOSCOPY WITH RETROGRADE PYELOGRAM;  Surgeon: Twylla Glendia BROCKS, MD;  Location: [facility];  Service: Urology;  Laterality: Left;   CYSTOSCOPY W/ URETERAL STENT PLACEMENT Left 12/13/2018   Procedure: CYSTOSCOPY WITH  RETROGRADE PYELOGRAM/URETERAL STENT PLACEMENT;  Surgeon: Watt Rush, MD;  Location: [facility];  Service: Urology;  Laterality: Left;   CYSTOSCOPY/URETEROSCOPY/HOLMIUM LASER/STENT PLACEMENT Left 01/20/2019   Procedure: CYSTOSCOPY/URETEROSCOPY/HOLMIUM LASER/STENT EXCHANGE;  Surgeon: Twylla Glendia BROCKS, MD;  Location: [facility];  Service: Urology;  Laterality: Left;   DILATION AND CURETTAGE OF UTERUS  2001   ESOPHAGOGASTRODUODENOSCOPY  08/2010   DX C CELIAC DISEASE   ESOPHAGOGASTRODUODENOSCOPY N/A 02/17/2021   Procedure: ESOPHAGOGASTRODUODENOSCOPY (EGD);  Surgeon: Maryruth Ole DASEN, MD;  Location[facility]COPY;  Service: Endoscopy;  Laterality: N/A;   HEART STENTS  05/27/2011   HIP SURGERY  2016   RIGHT IT BAND AND BURSA REMOVAL   JOINT REPLACEMENT  03/08/2023   2nd time on left leg   LEFT HEART CATH AND CORONARY ANGIOGRAPHY N/A 02/21/2023   Procedure: LEFT HEART CATH AND CORONARY ANGIOGRAPHY;  Surgeon: Anner Alm ORN, MD;  Location: [facility];  Service: Cardiovascular;  Laterality: N/A;   OOPHORECTOMY Right 1982   SPINE SURGERY     Laminectory 3x   TOTAL KNEE ARTHROPLASTY Left 01/19/2020   Procedure: TOTAL KNEE ARTHROPLASTY;  Surgeon: Beverley Evalene BIRCH, MD;  Location: [facility];  Service: Orthopedics;  Laterality: Left;   TOTAL KNEE REVISION Left 03/07/2022   Procedure: TOTAL KNEE REVISION;  Surgeon: Edna Toribio LABOR, MD;  Location: [facility];  Service: Orthopedics;  Laterality: Left;    Family History  Problem Relation Age of Onset   CAD Father    Transient ischemic attack Father    Diabetes Father    Cerebrovascular Accident Father    Heart disease Father        M-MVP; F BETA;BLOCKERS   Hypothyroidism Father    Hyperthyroidism Mother    COPD Mother    Cancer Maternal Uncle        KIDNEY   Uterine cancer Maternal Aunt 59   Heart disease Maternal Grandfather    Heart disease Paternal Grandmother    Learning disabilities Son     Social History   Socioeconomic  History   Marital status: Divorced    Spouse name: Not on file   Number of children: 2   Years of education: 16   Highest education level: Professional school degree (e.g., MD, DDS, DVM, JD)  Occupational History   Occupation: TEACHER  Tobacco Use   Smoking status: Never    Passive exposure: Never   Smokeless tobacco: Never  Vaping Use   Vaping status: Never Used  Substance and Sexual Activity   Alcohol  use: Not Currently    Comment: occasionally   Drug use: No   Sexual  activity: Not Currently    Birth control/protection: Surgical  Other Topics Concern   Not on file  Social History Narrative   Not on file   Social Drivers of Health   Financial Resource Strain: Medium Risk (10/01/2023)   Overall Financial Resource Strain (CARDIA)    Difficulty of Paying Living Expenses: Somewhat hard  Food Insecurity: No Food Insecurity (10/01/2023)   Hunger Vital Sign    Worried About Running Out of Food in the Last Year: Never true    Ran Out of Food in the Last Year: Never true  Transportation Needs: No Transportation Needs (10/01/2023)   PRAPARE - Administrator, Civil Service (Medical): No    Lack of Transportation (Non-Medical): No  Physical Activity: Inactive (10/01/2023)   Exercise Vital Sign    Days of Exercise per Week: 0 days    Minutes of Exercise per Session: Not on file  Stress: Stress Concern Present (10/01/2023)   [employer] of Occupational Health - Occupational Stress Questionnaire    Feeling of Stress: To some extent  Social Connections: Moderately Integrated (10/01/2023)   Social Connection and Isolation Panel    Frequency of Communication with Friends and Family: Three times a week    Frequency of Social Gatherings with Friends and Family: Once a week    Attends Religious Services: More than 4 times per year    Active Member of [employer] or Organizations: Yes    Attends Engineer, structural: More than 4 times per year    Marital Status: Divorced   Intimate Partner Violence: Not At Risk (02/20/2023)   Humiliation, Afraid, Rape, and Kick questionnaire    Fear of Current or Ex-Partner: No    Emotionally Abused: No    Physically Abused: No    Sexually Abused: No    No outpatient medications have been marked as taking for the 10/28/23 encounter (Appointment) with Vineet Kinney B, PA-C.      ROS:  Review of Systems  Constitutional:  Negative for fatigue, fever and unexpected weight change.  Respiratory:  Positive for cough. Negative for shortness of breath and wheezing.   Cardiovascular:  Negative for chest pain, palpitations and leg swelling.  Gastrointestinal:  Negative for blood in stool, constipation, diarrhea, nausea and vomiting.  Endocrine: Negative for cold intolerance, heat intolerance and polyuria.  Genitourinary:  Negative for dyspareunia, dysuria, flank pain, frequency, genital sores, hematuria, menstrual problem, pelvic pain, urgency, vaginal bleeding, vaginal discharge and vaginal pain.  Musculoskeletal:  Negative for back pain, joint swelling and myalgias.  Skin:  Positive for rash.  Neurological:  Negative for dizziness, syncope, light-headedness, numbness and headaches.  Hematological:  Negative for adenopathy. Does not bruise/bleed easily.  Psychiatric/Behavioral:  Negative for agitation, confusion, sleep disturbance and suicidal ideas. The patient is not nervous/anxious.      Objective: There were no vitals taken for this visit.   Physical Exam Constitutional:      Appearance: She is well-developed.  Genitourinary:     Vulva normal.     Right Labia: rash and skin changes.     Right Labia: No tenderness or lesions.    Left Labia: skin changes and rash.     Left Labia: No tenderness or lesions.    No vaginal discharge, erythema or tenderness.      Right Adnexa: not tender and no mass present.    Left Adnexa: not tender and no mass present.    No cervical friability or polyp.  Uterus is not  enlarged or tender.  Breasts:    Right: No mass, nipple discharge, skin change or tenderness.     Left: No mass, nipple discharge, skin change or tenderness.  Neck:     Thyroid : No thyromegaly.  Cardiovascular:     Rate and Rhythm: Normal rate and regular rhythm.     Heart sounds: Normal heart sounds. No murmur heard. Pulmonary:     Effort: Pulmonary effort is normal.     Breath sounds: Normal breath sounds.  Abdominal:     Palpations: Abdomen is soft.     Tenderness: There is no abdominal tenderness. There is no guarding or rebound.  Musculoskeletal:        General: Normal range of motion.     Cervical back: Normal range of motion.  Lymphadenopathy:     Cervical: No cervical adenopathy.  Neurological:     General: No focal deficit present.     Mental Status: She is alert and oriented to person, place, and time.     Cranial Nerves: No cranial nerve deficit.  Skin:    General: Skin is warm and dry.  Psychiatric:        Mood and Affect: Mood normal.        Behavior: Behavior normal.        Thought Content: Thought content normal.        Judgment: Judgment normal.  Vitals reviewed.    Assessment/Plan: Encounter for annual routine gynecological examination  Encounter for screening mammogram for malignant neoplasm of breast - Plan: MM 3D SCREENING MAMMOGRAM BILATERAL BREAST; pt to schedule mammo  Dermatitis--under panus; pt doing creams from derm.   Tinea cruris - Plan: clotrimazole -betamethasone  (LOTRISONE ) cream; Fungal component but also looks like area of LS post fourchette. Pt to treat with lotrisone  crm BID for 2 wks, then RTO for f/u. Will bx area of possible LS if still present.   No orders of the defined types were placed in this encounter.         GYN counsel breast self exam, mammography screening, menopause, adequate intake of calcium  and vitamin D, diet and exercise    F/U  No follow-ups on file.  Aury Scollard B. Gayland Nicol, PA-C 10/27/2023 7:48 PM

## 2023-10-28 ENCOUNTER — Other Ambulatory Visit: Payer: Self-pay

## 2023-10-28 ENCOUNTER — Ambulatory Visit: Admitting: Obstetrics and Gynecology

## 2023-10-28 ENCOUNTER — Ambulatory Visit: Payer: Self-pay | Admitting: *Deleted

## 2023-10-28 DIAGNOSIS — J4551 Severe persistent asthma with (acute) exacerbation: Secondary | ICD-10-CM

## 2023-10-28 DIAGNOSIS — L309 Dermatitis, unspecified: Secondary | ICD-10-CM

## 2023-10-28 DIAGNOSIS — Z01419 Encounter for gynecological examination (general) (routine) without abnormal findings: Secondary | ICD-10-CM

## 2023-10-28 DIAGNOSIS — Z1231 Encounter for screening mammogram for malignant neoplasm of breast: Secondary | ICD-10-CM

## 2023-10-28 DIAGNOSIS — Z1151 Encounter for screening for human papillomavirus (HPV): Secondary | ICD-10-CM

## 2023-10-28 DIAGNOSIS — L900 Lichen sclerosus et atrophicus: Secondary | ICD-10-CM

## 2023-10-28 DIAGNOSIS — Z124 Encounter for screening for malignant neoplasm of cervix: Secondary | ICD-10-CM

## 2023-10-28 MED ORDER — AMOXICILLIN-POT CLAVULANATE 875-125 MG PO TABS: 1.0000 | ORAL_TABLET | Freq: Two times a day (BID) | ORAL | 0 refills | Status: AC

## 2023-10-28 NOTE — Telephone Encounter (Signed)
 Dr Franchot - will let you decide if abx are appropriate after your visit on 10/17

## 2023-10-28 NOTE — Telephone Encounter (Signed)
 Please advise if another OV needed. Last OV 10/25/23 and now worsening sx of sore throat and continues with fever. See NT encounter. Please advise and patient requesting call back. Would like antibiotic if possible.      FYI Only or Action Required?: Action required by provider: update on patient condition.  Patient was last seen in primary care on 10/25/2023 by Brenda Isaiah LABOR, MD.  Called Nurse Triage reporting Cough.  Symptoms began today.  Interventions attempted: Prescription medications: prednisone  .  Symptoms are: gradually worsening.  Triage Disposition: See Physician Within 24 Hours  Patient/caregiver understands and will follow disposition?: No, wishes to speak with PCP              Copied from CRM #[id number]. Topic: Clinical - Red Word Triage >> Oct 28, 2023  8:10 AM [name] wrote: Red Word that prompted transfer to Nurse Triage: The patient reports feeling unwell since last Thursday. Current symptoms include:Persistent cough,Coughing up clear to yellow phlegm,Sore throat, and  Significant difficulty swallowing (can barely swallow) Reason for Disposition  [1] Continuous (nonstop) coughing interferes with work or school AND [2] no improvement using cough treatment per Care Advice  Answer Assessment - Initial Assessment Questions Worsening sx of sore throat, pain difficulty swallowing due to pain. Fever 100.2 this am. Coughing spells worsening. Continues taking prednisone  . Please advise if antibiotics can be prescribed. Patient still missing work. Last OV 10/25/23. Please advise if another OV needed. Patient requesting call back.        1. ONSET: When did the cough begin?      Last Thursday and worsening over weekend and now with severe sore throat  2. SEVERITY: How bad is the cough today?      Coughing spells  3. SPUTUM: Describe the color of your sputum (e.g., none, dry cough; clear, white, yellow, green)     Clear to yellow  4. HEMOPTYSIS:  Are you coughing up any blood? If Yes, ask: How much? (e.g., flecks, streaks, tablespoons, etc.)     na 5. DIFFICULTY BREATHING: Are you having difficulty breathing? If Yes, ask: How bad is it? (e.g., mild, moderate, severe)      No  6. FEVER: Do you have a fever? If Yes, ask: What is your temperature, how was it measured, and when did it start?     Yes 100.2 7. CARDIAC HISTORY: Do you have any history of heart disease? (e.g., heart attack, congestive heart failure)      na 8. LUNG HISTORY: Do you have any history of lung disease?  (e.g., pulmonary embolus, asthma, emphysema)     Hx asthma sleep apnea 9. PE RISK FACTORS: Do you have a history of blood clots? (or: recent major surgery, recent prolonged travel, bedridden)     na 10. OTHER SYMPTOMS: Do you have any other symptoms? (e.g., runny nose, wheezing, chest pain)       Sore throat severe pain with swallowing , cough productive clear to yellow no chest pain. Swollen glands around throat  11. PREGNANCY: Is there any chance you are pregnant? When was your last menstrual period?       na 12. TRAVEL: Have you traveled out of the country in the last month? (e.g., travel history, exposures)       na  Protocols used: Cough - Acute Productive-A-AH

## 2023-10-28 NOTE — Telephone Encounter (Signed)
 Please let patient know that I sent an antibiotic (Augmentin ) to her pharmacy. If she is not improving within a few days, then she should be seen in clinic or urgent care.

## 2023-10-29 ENCOUNTER — Telehealth: Payer: Self-pay

## 2023-10-29 ENCOUNTER — Other Ambulatory Visit: Payer: Self-pay

## 2023-10-29 DIAGNOSIS — B3731 Acute candidiasis of vulva and vagina: Secondary | ICD-10-CM

## 2023-10-29 MED ORDER — FLUCONAZOLE 150 MG PO TABS: 150.0000 mg | ORAL_TABLET | Freq: Once | ORAL | 0 refills | Status: AC

## 2023-10-29 NOTE — Telephone Encounter (Signed)
 Copied from CRM [id number]. Topic: Clinical - Medication Question >> Oct 28, 2023  2:03 PM Brenda Craig wrote: Reason for CRM: Patient calling in on behalf of her and her daughter to inquire if a Rx refill had been placed in for her cough and pain (Red Word Triaged This morning).   I spoke with a member from the [facility] who let me know thatGLENWOOD Pepper, MD & Lang, MD are out of office today so no movement will happen on the request today, but the patient should hear something positive by tomorrow morning.   **When available please give patient a call back regarding the status of this request**

## 2023-10-31 ENCOUNTER — Ambulatory Visit: Admitting: Internal Medicine

## 2023-11-01 ENCOUNTER — Inpatient Hospital Stay

## 2023-11-04 ENCOUNTER — Inpatient Hospital Stay: Attending: Oncology

## 2023-11-13 ENCOUNTER — Ambulatory Visit: Payer: Self-pay | Admitting: *Deleted

## 2023-11-13 NOTE — Telephone Encounter (Signed)
 Patient call note and symptoms reviewed. Agree with scheduled appt. Will evaluate during OV

## 2023-11-13 NOTE — Telephone Encounter (Signed)
 FYI Only or Action Required?: FYI only for provider: appointment scheduled on 11/6.  Patient was last seen in primary care on 10/25/2023 by Brenda Isaiah LABOR, MD.  Called Nurse Triage reporting Cough.  Symptoms began several weeks ago.  Interventions attempted: OTC medications: tylenol .  Symptoms are: unchanged.  Triage Disposition: See Physician Within 24 Hours  Patient/caregiver understands and will follow disposition?: Yes  Copied from CRM #[id number]. Topic: Clinical - Red Word Triage >> Nov 13, 2023  8:51 AM Mia F wrote: Red Word that prompted transfer to Nurse Triage: Had a upper respritory infection. still having chest discomfort and a cough. Also has a low grade fever. Fever is 99.0-100.4. She says she is coughing up phlem thatis yellow. Has been going on since the end of Sept. Has already taken an antibiotic. Feels like it is not going away and some days it feels it could be getting worse Reason for Disposition  [1] Continuous (nonstop) coughing interferes with work or school AND [2] no improvement using cough treatment per Care Advice  Answer Assessment - Initial Assessment Questions 1. ONSET: When did the cough begin?      End of September 2. SEVERITY: How bad is the cough today?      Constant sinus drainage- feels like something in the back of me throat constantly 3. SPUTUM: Describe the color of your sputum (e.g., none, dry cough; clear, white, yellow, green)     yellow 4. HEMOPTYSIS: Are you coughing up any blood? If Yes, ask: How much? (e.g., flecks, streaks, tablespoons, etc.)     no 5. DIFFICULTY BREATHING: Are you having difficulty breathing? If Yes, ask: How bad is it? (e.g., mild, moderate, severe)      no 6. FEVER: Do you have a fever? If Yes, ask: What is your temperature, how was it measured, and when did it start?     Low grade temp 7. CARDIAC HISTORY: Do you have any history of heart disease? (e.g., heart attack, congestive heart failure)       angina 8. LUNG HISTORY: Do you have any history of lung disease?  (e.g., pulmonary embolus, asthma, emphysema)     asthma 9. PE RISK FACTORS: Do you have a history of blood clots? (or: recent major surgery, recent prolonged travel, bedridden)     no 10. OTHER SYMPTOMS: Do you have any other symptoms? (e.g., runny nose, wheezing, chest pain)       Pain in upper leg  Protocols used: Cough - Acute Productive-A-AH

## 2023-11-14 ENCOUNTER — Ambulatory Visit: Payer: Self-pay | Admitting: Family Medicine

## 2023-11-14 ENCOUNTER — Ambulatory Visit: Admitting: Family Medicine

## 2023-11-14 ENCOUNTER — Ambulatory Visit
Admission: RE | Admit: 2023-11-14 | Discharge: 2023-11-14 | Disposition: A | Source: Ambulatory Visit | Attending: Family Medicine

## 2023-11-14 ENCOUNTER — Ambulatory Visit
Admission: RE | Admit: 2023-11-14 | Discharge: 2023-11-14 | Disposition: A | Discharge status: Home / Self Care | Attending: Family Medicine | Admitting: Family Medicine

## 2023-11-14 ENCOUNTER — Encounter: Payer: Self-pay | Admitting: Family Medicine

## 2023-11-14 VITALS — BP 110/74 | HR 66 | Temp 99.0°F | Ht 65.0 in | Wt 231.0 lb

## 2023-11-14 DIAGNOSIS — R319 Hematuria, unspecified: Secondary | ICD-10-CM | POA: Diagnosis not present

## 2023-11-14 DIAGNOSIS — R809 Proteinuria, unspecified: Secondary | ICD-10-CM

## 2023-11-14 DIAGNOSIS — R10A2 Flank pain, left side: Secondary | ICD-10-CM | POA: Insufficient documentation

## 2023-11-14 DIAGNOSIS — R053 Chronic cough: Secondary | ICD-10-CM

## 2023-11-14 DIAGNOSIS — R350 Frequency of micturition: Secondary | ICD-10-CM | POA: Diagnosis not present

## 2023-11-14 DIAGNOSIS — Z87442 Personal history of urinary calculi: Secondary | ICD-10-CM

## 2023-11-14 LAB — POCT URINALYSIS DIPSTICK
Bilirubin, UA: NEGATIVE
Glucose, UA: NEGATIVE
Ketones, UA: NEGATIVE
Leukocytes, UA: NEGATIVE
Nitrite, UA: NEGATIVE
Odor: NORMAL
Protein, UA: POSITIVE — AB
Spec Grav, UA: 1.025 (ref 1.010–1.025)
Urobilinogen, UA: 0.2 U/dL
pH, UA: 5 (ref 5.0–8.0)

## 2023-11-14 MED ORDER — PREDNISONE 20 MG PO TABS: 20.0000 mg | ORAL_TABLET | Freq: Every day | ORAL | 0 refills | Status: AC

## 2023-11-14 MED ORDER — PANTOPRAZOLE SODIUM 40 MG PO TBEC: 40.0000 mg | DELAYED_RELEASE_TABLET | Freq: Two times a day (BID) | ORAL | 2 refills | Status: DC

## 2023-11-14 NOTE — Progress Notes (Signed)
 ACUTE VISIT   Patient: Brenda Craig   DOB: Jul 31, 1960   63 y.o. Female  MRN: [id number]   PCP: Sharma Coyer, MD  Chief Complaint  Patient presents with   Cough    Patient c/o cough, chest congestion yellow mucus, low grade fever. Treated for URI in September and this has been ongoing since then. Has some chest discomfort, some rattling breathing at nighttime and early AM.    Flank Pain    Patient reports flank pain onset x 2 days with urinary frequency    Subjective    HPI HPI     Cough    Additional comments: Patient c/o cough, chest congestion yellow mucus, low grade fever. Treated for URI in September and this has been ongoing since then. Has some chest discomfort, some rattling breathing at nighttime and early AM.         Flank Pain    Additional comments: Patient reports flank pain onset x 2 days with urinary frequency       Last edited by Cherry Chiquita HERO, CMA on 11/14/2023 11:04 AM.       Discussed the use of AI scribe software for clinical note transcription with the patient, who gave verbal consent to proceed.  History of Present Illness Brenda Craig is a 63 year old female with chronic asthma who presents with cough, congestion, left flank pain, and urinary frequency.  She has had a persistent cough for approximately two months, which is productive with occasional yellowish sputum. The cough worsens in the morning and at night, especially after removing her CPAP. She experiences a 'raspy' feeling but denies shortness of breath or chest tightness. Tussinex provides some relief, and she is currently on Trelegy. She also takes Protonix  40 mg in the morning and famotidine  at night for a known hiatal hernia.  She experiences left flank pain that began on October 10th, initially thought to be a pulled muscle. The pain is severe enough to require the use of a walker and cane for ambulation. With a history of kidney stones, she is concerned  about a recurrence, as she has experienced kidney infections and sepsis in the past. The pain occasionally radiates in a 'straight curve' and is intermittent. A five-day course of steroids provided some relief, but a recent steroid injection did not alleviate the pain.  She reports urinary frequency and a low-grade fever, ranging from 22F to 100.32F, occurring nightly. No recent imaging of her chest has been done since the cough began, and her last abdominal ultrasound was in June.     Medications: Outpatient Medications Prior to Visit  Medication Sig   acetaminophen  (TYLENOL ) 500 MG tablet Take 1,000 mg by mouth as needed for moderate pain or headache.   albuterol  (VENTOLIN  HFA) 108 (90 Base) MCG/ACT inhaler Inhale 2 puffs into the lungs as needed for wheezing or shortness of breath.   aspirin  EC 81 MG tablet Take 81 mg by mouth daily. Swallow whole.   benzonatate  (TESSALON ) 200 MG capsule Take 200 mg by mouth 3 (three) times daily as needed.   chlorpheniramine-HYDROcodone  (TUSSIONEX) 10-8 MG/5ML Take 5 mLs by mouth 2 (two) times daily.   clobetasol  ointment (TEMOVATE ) 0.05 % Apply to affected area every night for 4 weeks, then every other day for 4 weeks and then 1-2 times a week for maintenance   diphenhydrAMINE  (BENADRYL  ALLERGY ) 25 MG tablet Take 25 mg by mouth as needed for itching.   famotidine  (PEPCID ) 40  MG tablet Take 40 mg by mouth at bedtime.   Fluticasone -Umeclidin-Vilant (TRELEGY ELLIPTA ) 200-62.5-25 MCG/ACT AEPB Inhale 1 puff into the lungs daily.   LORazepam  (ATIVAN ) 1 MG tablet Take 1 mg by mouth 2 (two) times daily.   losartan  (COZAAR ) 25 MG tablet Take 1 tablet (25 mg total) by mouth daily.   Melatonin 5 MG CAPS Take 5 mg by mouth at bedtime.   montelukast  (SINGULAIR ) 10 MG tablet Take 10 mg by mouth daily.   nebivolol  (BYSTOLIC ) 5 MG tablet Take 1 tablet (5 mg total) by mouth daily with supper.   nitroGLYCERIN  (NITROSTAT ) 0.4 MG SL tablet Place 1 tablet (0.4 mg total)  under the tongue every 5 (five) minutes as needed for chest pain.   nystatin cream (MYCOSTATIN) Apply 1 Application topically as needed (rash).   rosuvastatin  (CRESTOR ) 10 MG tablet Take 10 mg by mouth at bedtime.   triamcinolone  cream (KENALOG ) 0.1 % Apply 1 Application topically as needed (break out).   [DISCONTINUED] pantoprazole  (PROTONIX ) 20 MG tablet Take 40 mg by mouth daily.   [DISCONTINUED] promethazine -dextromethorphan  (PROMETHAZINE -DM) 6.25-15 MG/5ML syrup Take 5 mLs by mouth 4 (four) times daily as needed.   No facility-administered medications prior to visit.        Objective    BP 110/74 (BP Location: Left Arm, Patient Position: Sitting, Cuff Size: Normal)   Pulse 66   Temp 99 F (37.2 C) (Oral)   Ht 5' 5 (1.651 m)   Wt 231 lb (104.8 kg)   SpO2 95%   BMI 38.44 kg/m    Physical Exam   Physical Exam GENERAL: Ambulating with a cane. CHEST: Clear to auscultation bilaterally, no wheezes, no crackles, no respiratory distress. ABDOMEN: Soft, normal bowel sounds.   Results for orders placed or performed in visit on 11/14/23  POCT Urinalysis Dipstick  Result Value Ref Range   Color, UA yellow    Clarity, UA clear    Glucose, UA Negative Negative   Bilirubin, UA negative    Ketones, UA negative    Spec Grav, UA 1.025 1.010 - 1.025   Blood, UA hemolyzed trace (A)    pH, UA 5.0 5.0 - 8.0   Protein, UA Positive (A) Negative   Urobilinogen, UA 0.2 0.2 or 1.0 E.U./dL   Nitrite, UA negative    Leukocytes, UA Negative Negative   Appearance clear    Odor normal     Assessment & Plan     Assessment and Plan Assessment & Plan Chronic cough and asthma Chronic cough persisting for two months, productive with yellowish sputum. No shortness of breath or chest tightness. Asthma diagnosis confirmed by pulmonology, but symptoms not typical of asthma exacerbation. Differential includes hiatal hernia and gastroesophageal reflux disease (GERD) as potential contributors.  Previous treatment with Tussinex provided some relief. Consideration of steroid taper to assess response and potential benefit for hip pain. - Ordered chest x-ray and chest CT without contrast to evaluate for hiatal hernia and other potential causes of cough. - Continue Tussinex as prescribed. - Initiated prednisone  40 mg daily for 7 days to assess response. - Increased Protonix  to 40 mg twice daily. - Will follow up with pulmonology next week.  Left flank pain with urinary frequency, hematuria, and proteinuria (under evaluation) Left flank pain since October 10th, with urinary frequency, hematuria, and proteinuria. Pain exacerbated by movement, requiring use of a walker. Differential includes kidney stones, given history of urinary calculi and current symptoms. - Ordered urine culture and studies. - Ordered  abdominal x-ray to evaluate for kidney stones. - Will follow up with imaging results to guide further management.  Hiatal hernia with gastroesophageal reflux symptoms Known hiatal hernia with symptoms of gastroesophageal reflux, including heartburn and cough. Current management includes Protonix  and famotidine . Consideration of surgical intervention if symptoms persist despite medical management. Discussion of potential benefits of surgery in resolving chronic cough and reflux symptoms. - Increased Protonix  to 40 mg twice daily. - Ordered chest CT to evaluate hiatal hernia. - Will consider referral to cardiothoracic surgery if CT confirms significant hiatal hernia.  History of urinary calculi Previous episodes of kidney stones. Current symptoms of flank pain and urinary changes raise concern for possible recurrence. - Ordered abdominal x-ray to evaluate for kidney stones. - Will follow up with imaging results to guide further management.      No follow-ups on file.        Rockie Agent, MD  [facility] [phone] (phone) [phone]  (fax)  [facility]

## 2023-11-15 ENCOUNTER — Encounter: Payer: Self-pay | Admitting: Family Medicine

## 2023-11-16 LAB — URINE CULTURE

## 2023-11-20 ENCOUNTER — Encounter: Payer: Self-pay | Admitting: Family Medicine

## 2023-11-21 ENCOUNTER — Ambulatory Visit: Admitting: Internal Medicine

## 2023-11-27 ENCOUNTER — Ambulatory Visit: Admitting: Obstetrics and Gynecology

## 2023-12-02 ENCOUNTER — Inpatient Hospital Stay: Attending: Oncology

## 2023-12-02 ENCOUNTER — Inpatient Hospital Stay

## 2023-12-02 ENCOUNTER — Telehealth: Payer: Self-pay | Admitting: Oncology

## 2023-12-02 DIAGNOSIS — Z79899 Other long term (current) drug therapy: Secondary | ICD-10-CM | POA: Insufficient documentation

## 2023-12-02 DIAGNOSIS — E611 Iron deficiency: Secondary | ICD-10-CM | POA: Insufficient documentation

## 2023-12-02 DIAGNOSIS — E538 Deficiency of other specified B group vitamins: Secondary | ICD-10-CM | POA: Diagnosis not present

## 2023-12-02 MED ORDER — CYANOCOBALAMIN 1000 MCG/ML IJ SOLN
1000.0000 ug | Freq: Once | INTRAMUSCULAR | Status: AC
  Administered 2023-12-02: 1000 ug via INTRAMUSCULAR
  Filled 2023-12-02: qty 1

## 2023-12-02 NOTE — Telephone Encounter (Signed)
 Pt was here today for b12 injection. Pt stated she had missed one of her injections and wants to know if she needs to r/s that or keep her lab appt as scheduled and go from there. Please advise. I told pt we would call her back with an update (most likely tomorrow due to it being busy right now and pt said that was fine)

## 2023-12-03 ENCOUNTER — Other Ambulatory Visit: Payer: Self-pay

## 2023-12-03 DIAGNOSIS — E611 Iron deficiency: Secondary | ICD-10-CM

## 2023-12-17 ENCOUNTER — Ambulatory Visit: Admitting: Obstetrics and Gynecology

## 2023-12-18 ENCOUNTER — Other Ambulatory Visit: Payer: Self-pay | Admitting: Internal Medicine

## 2023-12-18 DIAGNOSIS — J45991 Cough variant asthma: Secondary | ICD-10-CM

## 2023-12-19 ENCOUNTER — Other Ambulatory Visit: Payer: Self-pay | Admitting: Family Medicine

## 2023-12-19 NOTE — Telephone Encounter (Unsigned)
 Copied from CRM #[id number]. Topic: Clinical - Medication Refill >> Dec 19, 2023  9:21 AM Larissa S wrote: Medication: LORazepam  (ATIVAN ) 1 MG tablet rosuvastatin  (CRESTOR ) 10 MG tablet   Has the patient contacted their pharmacy? Yes (Agent: If no, request that the patient contact the pharmacy for the refill. If patient does not wish to contact the pharmacy document the reason why and proceed with request.) (Agent: If yes, when and what did the pharmacy advise?)  This is the patient's preferred pharmacy:  [id number] - [address] [place] [address] Phone: [phone] Fax: [phone]  Is this the correct pharmacy for this prescription? Yes If no, delete pharmacy and type the correct one.   Has the prescription been filled recently? No  Is the patient out of the medication? Yes  Has the patient been seen for an appointment in the last year OR does the patient have an upcoming appointment? Yes  Can we respond through MyChart? Yes  Agent: Please be advised that Rx refills may take up to 3 business days. We ask that you follow-up with your pharmacy.

## 2023-12-20 NOTE — Telephone Encounter (Signed)
 Requested medication (s) are due for refill today: routing for review  Requested medication (s) are on the active medication list: no  Last refill:  12/01/12 and 07/14/20  Future visit scheduled: {Yes  Notes to clinic:  historical medication     Requested Prescriptions  Pending Prescriptions Disp Refills   LORazepam  (ATIVAN ) 1 MG tablet 30 tablet     Sig: Take 1 tablet (1 mg total) by mouth 2 (two) times daily.     Not Delegated - Psychiatry: Anxiolytics/Hypnotics 2 Failed - 12/20/2023  4:01 PM      Failed - This refill cannot be delegated      Failed - Urine Drug Screen completed in last 360 days      Passed - Patient is not pregnant      Passed - Valid encounter within last 6 months    Recent Outpatient Visits           1 month ago Chronic cough   [place] [facility], [place], MD   1 month ago Severe persistent asthma with exacerbation [facility])   [facility] Franchot Isaiah LABOR, MD   2 months ago OSA (obstructive sleep apnea)   [facility] Simmons-Robinson, [place]ie, MD       Future Appointments             In 6 months Vaillancourt, [place], [place] [facility] [place]             rosuvastatin  (CRESTOR ) 10 MG tablet      Sig: Take 1 tablet (10 mg total) by mouth at bedtime.     Cardiovascular:  Antilipid - Statins 2 Failed - 12/20/2023  4:01 PM      Failed - Lipid Panel in normal range within the last 12 months    Cholesterol  Date Value Ref Range Status  02/21/2023 128 0 - 200 mg/dL Final   LDL Cholesterol  Date Value Ref Range Status  02/21/2023 56 0 - 99 mg/dL Final    Comment:           Total Cholesterol/HDL:CHD Risk Coronary Heart Disease Risk Table                     Men   Women  1/2 Average Risk   3.4   3.3  Average Risk       5.0   4.4  2 X Average Risk   9.6   7.1  3 X Average Risk  23.4   11.0        Use the calculated Patient Ratio above and  the CHD Risk Table to determine the patient's CHD Risk.        ATP III CLASSIFICATION (LDL):  <100     mg/dL   Optimal  899-870  mg/dL   Near or Above                    Optimal  130-159  mg/dL   Borderline  839-810  mg/dL   High  >809     mg/dL   Very High Performed at [facility], [address] [place], [place] [address]    HDL  Date Value Ref Range Status  02/21/2023 57 >40 mg/dL Final   Triglycerides  Date Value Ref Range Status  02/21/2023 74 <150 mg/dL Final         Passed - Cr in normal range and within 360 days  Creatinine  Date Value Ref Range Status  06/10/2013 0.87 0.60 - 1.30 mg/dL Final   Creatinine, Ser  Date Value Ref Range Status  02/20/2023 0.88 0.44 - 1.00 mg/dL Final         Passed - Patient is not pregnant      Passed - Valid encounter within last 12 months    Recent Outpatient Visits           1 month ago Chronic cough   [facility] [place], [place], MD   1 month ago Severe persistent asthma with exacerbation [facility])   [place] [facility] Franchot Isaiah LABOR, MD   2 months ago OSA (obstructive sleep apnea)   [place] [facility] Simmons-Robinson, [place], MD       Future Appointments             In 6 months Vaillancourt, Samantha, PA-C [facility] [place]

## 2023-12-23 MED ORDER — ROSUVASTATIN CALCIUM 10 MG PO TABS: 10.0000 mg | ORAL_TABLET | Freq: Every day | ORAL | 1 refills | Status: AC

## 2023-12-24 ENCOUNTER — Other Ambulatory Visit: Payer: Self-pay

## 2023-12-24 ENCOUNTER — Other Ambulatory Visit: Payer: Self-pay | Admitting: Family Medicine

## 2023-12-24 ENCOUNTER — Encounter: Payer: Self-pay | Admitting: Family Medicine

## 2023-12-24 NOTE — Telephone Encounter (Signed)
 Pt called to report that she requested this last Thursday. Says she will be out tonight, she has been cutting them in half since Saturday. Please advise.

## 2023-12-24 NOTE — Telephone Encounter (Signed)
 Refill Request   [facility]  Medication: LORazepam  (ATIVAN ) 1 MG tablet  Quantity (if provided): NA  Please send in refill request.

## 2023-12-25 ENCOUNTER — Other Ambulatory Visit: Payer: Self-pay

## 2023-12-25 ENCOUNTER — Other Ambulatory Visit: Payer: Self-pay | Admitting: Family Medicine

## 2023-12-25 DIAGNOSIS — F419 Anxiety disorder, unspecified: Secondary | ICD-10-CM

## 2023-12-25 MED ORDER — LORAZEPAM 1 MG PO TABS: 1.0000 mg | ORAL_TABLET | Freq: Two times a day (BID) | ORAL | 2 refills | Status: DC

## 2023-12-25 NOTE — Telephone Encounter (Signed)
 Copied from CRM #[id number]. Topic: Clinical - Medication Refill >> Dec 25, 2023  8:57 AM Everette C wrote: Medication: LORazepam  (ATIVAN ) 1 MG tablet [[id number]]  Has the patient contacted their pharmacy? No (Agent: If no, request that the patient contact the pharmacy for the refill. If patient does not wish to contact the pharmacy document the reason why and proceed with request.) (Agent: If yes, when and what did the pharmacy advise?)  This is the patient's preferred pharmacy:  [id number] - [address] [place] [address] Phone: [phone] Fax: [phone]  Is this the correct pharmacy for this prescription? Yes If no, delete pharmacy and type the correct one.   Has the prescription been filled recently? Yes  Is the patient out of the medication? Yes  Has the patient been seen for an appointment in the last year OR does the patient have an upcoming appointment? Yes  Can we respond through MyChart? No  Agent: Please be advised that Rx refills may take up to 3 business days. We ask that you follow-up with your pharmacy.

## 2023-12-30 ENCOUNTER — Inpatient Hospital Stay: Attending: Oncology

## 2023-12-30 ENCOUNTER — Other Ambulatory Visit

## 2023-12-30 DIAGNOSIS — E611 Iron deficiency: Secondary | ICD-10-CM | POA: Insufficient documentation

## 2023-12-30 DIAGNOSIS — E538 Deficiency of other specified B group vitamins: Secondary | ICD-10-CM | POA: Insufficient documentation

## 2023-12-30 DIAGNOSIS — Z79899 Other long term (current) drug therapy: Secondary | ICD-10-CM | POA: Diagnosis not present

## 2023-12-30 MED ORDER — CYANOCOBALAMIN 1000 MCG/ML IJ SOLN
1000.0000 ug | Freq: Once | INTRAMUSCULAR | Status: AC
  Administered 2023-12-30: 1000 ug via INTRAMUSCULAR
  Filled 2023-12-30: qty 1

## 2024-01-08 ENCOUNTER — Ambulatory Visit: Admitting: Family Medicine

## 2024-01-14 ENCOUNTER — Ambulatory Visit

## 2024-01-27 ENCOUNTER — Inpatient Hospital Stay: Attending: Oncology

## 2024-01-27 DIAGNOSIS — Z79899 Other long term (current) drug therapy: Secondary | ICD-10-CM | POA: Diagnosis not present

## 2024-01-27 DIAGNOSIS — E538 Deficiency of other specified B group vitamins: Secondary | ICD-10-CM | POA: Diagnosis not present

## 2024-01-27 DIAGNOSIS — E611 Iron deficiency: Secondary | ICD-10-CM | POA: Insufficient documentation

## 2024-01-27 LAB — VITAMIN D 25 HYDROXY (VIT D DEFICIENCY, FRACTURES): Vit D, 25-Hydroxy: 16.3 ng/mL — ABNORMAL LOW (ref 30–100)

## 2024-01-27 LAB — VITAMIN B12: Vitamin B-12: 566 pg/mL (ref 180–914)

## 2024-01-28 ENCOUNTER — Encounter: Payer: Self-pay | Admitting: Oncology

## 2024-01-28 ENCOUNTER — Encounter: Payer: Self-pay | Admitting: Family Medicine

## 2024-01-30 ENCOUNTER — Other Ambulatory Visit: Payer: Self-pay | Admitting: Family Medicine

## 2024-01-30 DIAGNOSIS — E559 Vitamin D deficiency, unspecified: Secondary | ICD-10-CM

## 2024-01-30 DIAGNOSIS — F419 Anxiety disorder, unspecified: Secondary | ICD-10-CM

## 2024-01-30 DIAGNOSIS — Z13820 Encounter for screening for osteoporosis: Secondary | ICD-10-CM

## 2024-01-30 MED ORDER — LORAZEPAM 1 MG PO TABS: 1.0000 mg | ORAL_TABLET | Freq: Two times a day (BID) | ORAL | 2 refills | Status: AC

## 2024-01-30 MED ORDER — VITAMIN D (ERGOCALCIFEROL) 1.25 MG (50000 UNIT) PO CAPS: 50000.0000 [IU] | ORAL_CAPSULE | ORAL | 1 refills | Status: AC

## 2024-01-30 NOTE — Progress Notes (Signed)
 Vitamin D  weekly dosing prescribed for deficiency, level 16

## 2024-01-31 ENCOUNTER — Encounter: Payer: Self-pay | Admitting: Family Medicine

## 2024-01-31 ENCOUNTER — Ambulatory Visit: Admitting: Family Medicine

## 2024-01-31 VITALS — BP 118/68 | HR 70 | Temp 98.1°F | Resp 16 | Ht 65.0 in | Wt 232.0 lb

## 2024-01-31 DIAGNOSIS — R053 Chronic cough: Secondary | ICD-10-CM

## 2024-01-31 DIAGNOSIS — J0101 Acute recurrent maxillary sinusitis: Secondary | ICD-10-CM

## 2024-01-31 LAB — POC COVID19/FLU A&B COMBO
Covid Antigen, POC: NEGATIVE
Influenza A Antigen, POC: NEGATIVE
Influenza B Antigen, POC: NEGATIVE

## 2024-01-31 MED ORDER — BENZONATATE 200 MG PO CAPS: 200.0000 mg | ORAL_CAPSULE | Freq: Two times a day (BID) | ORAL | 0 refills | Status: AC | PRN

## 2024-01-31 MED ORDER — AMOXICILLIN-POT CLAVULANATE 875-125 MG PO TABS: 1.0000 | ORAL_TABLET | Freq: Two times a day (BID) | ORAL | 0 refills | Status: AC

## 2024-01-31 NOTE — Assessment & Plan Note (Addendum)
 Chronic cough worse in the last few days.  V/s stable. Lungs clear to auscultation.   -Benzonatate  200mg  BID PRN cough  -Treating maxillary sinusitis as noted above  Orders:   POC Covid19/Flu A&B Antigen   benzonatate  (TESSALON ) 200 MG capsule; Take 1 capsule (200 mg total) by mouth 2 (two) times daily as needed for cough.

## 2024-01-31 NOTE — Progress Notes (Signed)
 "  Acute Office Visit  Subjective:     Patient ID: Brenda Craig, female    DOB: 11/12/1960, 64 y.o.   MRN: [id number]  Chief Complaint  Patient presents with   Sinusitis    X5 days   Cough    Has chronic cough, worsening with sinuses     Patient is in today for complaints of sinus congestion and cough. She is a new patient to me. She voices that cough is chronic but has been worse in the last few days. Sinus congestion and sinus pain for 5 days. She also voices associated fever. Last fever Tuesday. Tmax of 101. She voices hx of recurrent sinusitis and recurrent bronchitis. She is established with ENT, last seen in the last one to two months per patient report. She voices she has been using a cough syrup that was sent in by her PCP and this has helped.   Will note diagnosis of asthma and she has been compliant with Trelegy.  Review of Systems  Constitutional:  Positive for fever.  HENT:  Positive for congestion and sinus pain. Negative for ear pain and sore throat.   Respiratory:  Positive for cough and wheezing. Negative for shortness of breath.         Objective:    BP 118/68   Pulse 70   Temp 98.1 F (36.7 C)   Resp 16   Ht 5' 5 (1.651 m)   Wt 232 lb (105.2 kg)   SpO2 98%   BMI 38.61 kg/m      Physical Exam Constitutional:      Appearance: She is not toxic-appearing.  HENT:     Head: Normocephalic.     Right Ear: Tympanic membrane normal.     Left Ear: Tympanic membrane normal.     Nose: Congestion present.     Right Sinus: Maxillary sinus tenderness present.     Left Sinus: Maxillary sinus tenderness present.     Mouth/Throat:     Pharynx: Oropharynx is clear. Uvula midline. No oropharyngeal exudate.  Cardiovascular:     Rate and Rhythm: Normal rate and regular rhythm.     Heart sounds: Normal heart sounds.  Pulmonary:     Effort: Pulmonary effort is normal. No respiratory distress.     Breath sounds: Normal breath sounds. No wheezing, rhonchi or  rales.  Skin:    General: Skin is warm and dry.  Neurological:     General: No focal deficit present.     Mental Status: She is alert.  Psychiatric:        Mood and Affect: Mood normal.        Behavior: Behavior normal.     Results for orders placed or performed in visit on 01/31/24  POC Covid19/Flu A&B Antigen  Result Value Ref Range   Influenza A Antigen, POC Negative Negative   Influenza B Antigen, POC Negative Negative   Covid Antigen, POC Negative Negative        Assessment & Plan:   Assessment & Plan Acute recurrent maxillary sinusitis Acute recurrent maxillary sinusitis. Established with ENT due to recurrent sinusitis, patient reporting at least 4 episodes in a year time period.  Rapid COVID and flu testing negative.  Maxillary sinus tenderness present at time of exam today.  Treat as acute recurrent maxillary sinusitis, abx started  -Augmentin  875/125mg  BID x 7 days. -Continue following with ENT  Orders:   POC Covid19/Flu A&B Antigen   amoxicillin -clavulanate (AUGMENTIN ) 875-125 MG tablet; Take  1 tablet by mouth 2 (two) times daily. Take medication with food.  Chronic cough Chronic cough worse in the last few days.  V/s stable. Lungs clear to auscultation.   -Benzonatate  200mg  BID PRN cough  -Treating maxillary sinusitis as noted above  Orders:   POC Covid19/Flu A&B Antigen   benzonatate  (TESSALON ) 200 MG capsule; Take 1 capsule (200 mg total) by mouth 2 (two) times daily as needed for cough.     Return if symptoms worsen or fail to improve.  LAYMON LOISE CORE, FNP  "

## 2024-02-05 ENCOUNTER — Ambulatory Visit: Admitting: Internal Medicine

## 2024-02-05 ENCOUNTER — Encounter: Payer: Self-pay | Admitting: Internal Medicine

## 2024-02-05 VITALS — BP 120/60 | HR 78 | Temp 99.1°F | Ht 65.0 in | Wt 232.0 lb

## 2024-02-05 DIAGNOSIS — E669 Obesity, unspecified: Secondary | ICD-10-CM | POA: Diagnosis not present

## 2024-02-05 DIAGNOSIS — J9690 Respiratory failure, unspecified, unspecified whether with hypoxia or hypercapnia: Secondary | ICD-10-CM

## 2024-02-05 DIAGNOSIS — Z6838 Body mass index (BMI) 38.0-38.9, adult: Secondary | ICD-10-CM

## 2024-02-05 DIAGNOSIS — R053 Chronic cough: Secondary | ICD-10-CM | POA: Diagnosis not present

## 2024-02-05 DIAGNOSIS — K449 Diaphragmatic hernia without obstruction or gangrene: Secondary | ICD-10-CM | POA: Diagnosis not present

## 2024-02-05 DIAGNOSIS — J309 Allergic rhinitis, unspecified: Secondary | ICD-10-CM

## 2024-02-05 DIAGNOSIS — K219 Gastro-esophageal reflux disease without esophagitis: Secondary | ICD-10-CM

## 2024-02-05 DIAGNOSIS — G4733 Obstructive sleep apnea (adult) (pediatric): Secondary | ICD-10-CM | POA: Diagnosis not present

## 2024-02-05 DIAGNOSIS — J4540 Moderate persistent asthma, uncomplicated: Secondary | ICD-10-CM

## 2024-02-05 NOTE — Patient Instructions (Addendum)
 Need to be using your CPAP every night, minimum of 4-6 hours a night.  Change equipment as directed. Wash your tubing with warm soap and water  daily, hang to dry. Wash humidifier portion weekly. Use bottled, distilled water  and change daily Be aware of reduced alertness and do not drive or operate heavy machinery if experiencing this or drowsiness.  Exercise encouraged, as tolerated. Healthy weight management discussed.  Avoid or decrease alcohol  consumption and medications that make you more sleepy, if possible. Notify if persistent daytime sleepiness occurs even with consistent use of PAP therapy.   Change CPAP supplies... Every month Mask cushions and/or nasal pillows CPAP machine filters Every 3 months Mask frame (not including the headgear) CPAP tubing Every 6 months Mask headgear Chin strap (if applicable) Humidifier water  tub   Excellent Job A+ GOLD STAR!!  Continue CPAP as prescribed  Patient Instructions Continue to use CPAP every night, minimum of 4-6 hours a night.  Change equipment every 30 days or as directed by DME.  Wash your tubing with warm soap and water  daily, hang to dry. Wash humidifier portion weekly. Use bottled, distilled water  and change daily   Be aware of reduced alertness and do not drive or operate heavy machinery if experiencing this or drowsiness.  Exercise encouraged, as tolerated. Encouraged proper weight management.  Important to get eight or more hours of sleep  Limiting the use of the computer and television before bedtime.  Decrease naps during the day, so night time sleep will become enhanced.  Limit caffeine, and sleep deprivation.    Avoid Allergens and Irritants Avoid secondhand smoke Avoid SICK contacts Recommend  Masking  when appropriate Recommend Keep up-to-date with vaccinations Continue Trelegy as prescribed rinse mouth after use

## 2024-02-05 NOTE — Progress Notes (Signed)
 Open Ms. Brenda Charleston  @Patient  ID: Brenda Craig, female    DOB: April 24, 1960, 64 y.o.   MRN: [id number]  SYNOPSIS Ms. Palladino is referred for evaluation of chronic cough of many years duration. She dates it back to 2009 or 2010. Her cough has been intermittent but has to be worse in the spring autumn and winter. DX with ASTHMA DX of GERD DX of OSA S/p cardiac arrest 2013 AHI of 21  PREVIOUS HISTORY Patient with severe persistent cough  Patient has a chronic cough for the last 10 years She has been worked up multiple times at [facility] She has had ENT referrals in the past with extensive work-up   At this time no one can tell her what is causing her cough Patient is requesting refill on Tussionex I have explained to patient we no longer prescribe narcotic-based antitussive  medicines    Exposed to cats and dogs at home She states she is not allergic but would like a referral for assessment   Patient had normal eosinophil levels of 100 Patient is not a candidate for immunological or biological therapy    CC  Follow-up assessment for OSA Follow-up assessment for chronic cough cough variant asthma   HPI: 64 year old female, never smoked. PMH significant for OSA, chronic cough, hyperreactive airways, chronic respiratory failure with hypoxia, pneumonia, HTN, CAD, acid reflux, celiac disease, hyperlipidemia.  Patient presents today for OSA follow-up/ OSA and chronic cough.  Currently on antibiotics for acute sinus infection I have explained to patient I will need to do bronchoscopy  She refuses to have any type of bronchoscopy for airway assessment Patient has chronic cough for more than 10 years Patient states she is responding to Trelegy inhaler therapy 200 Rinse his mouth after use  Regarding chronic cough Patient has significant allergic rhinitis as well as severe reflux disease along with a history of hiatal hernia in the setting of obesity and restrictive lung  disease with previous history of COVID infection 3 times in the past several years Patient currently on Protonix  twice daily    Regarding OSA  Discussed sleep data and reviewed with patient.  Encouraged proper weight management.  Discussed sleep hygiene Patient uses and benefits from therapy Using CPAP nightly and with naps Settings are comfortable and is sleeping well. Auto CPAP 4-10 AHI reduced to 2.3 Excellent compliance report Was fullface mask  No exacerbation at this time No evidence of heart failure at this time No evidence or signs of infection at this time No respiratory distress No fevers, chills, nausea, vomiting, diarrhea No evidence of lower extremity edema No evidence hemoptysis     JANUARY 2024 CT CHEST Generally bland appearing, bandlike scarring and or atelectasis of the bilateral lung bases as well as of the anterior right upper lobe. Minimal dependent bibasilar ground-glass without evident interlobular septal thickening or subpleural bronchiolectasis. Minimal, tubular bronchiectasis at the lung bases. Findings are not significantly changed compared to examinations dating back to 05/21/2016. Findings are most consistent with sequelae of prior infection or aspiration without specific evidence of fibrotic interstitial lung disease  CT of the chest reviewed in detail with the patient Next step is to perform bronchoscopy however patient is unwilling to do this at this time    Allergies  Allergen Reactions   Bacitracin Other (See Comments)   Gluten Meal Diarrhea and Nausea And Vomiting   Neomycin Other (See Comments)   Other Other (See Comments)    Gluten allergy Celiac  Polymyxin B Other (See Comments)   Wheat Extract Other (See Comments)   Codeine  Nausea And Vomiting and Rash   Esomeprazole Magnesium  Palpitations and Other (See Comments)    IRREGULAR HEARTBEAT   Iodinated Contrast Media Itching   Omeprazole Magnesium  Palpitations and Other  (See Comments)    IRREGULAR HEARTBEAT   Percocet [Oxycodone -Acetaminophen ] Itching, Nausea And Vomiting and Rash    Can take with benadryl     Tramadol  Rash    Can take with benadryl      Immunization History  Administered Date(s) Administered   Influenza,inj,Quad PF,6+ Mos 05/25/2013, 11/13/2017, 10/17/2018   Influenza,trivalent, recombinat, inj, PF 11/16/2022, 01/10/2024   Influenza-Unspecified 10/10/2020, 10/08/2021   PFIZER Comirnaty(Gray Top)Covid-19 Tri-Sucrose Vaccine 05/06/2020   PFIZER(Purple Top)SARS-COV-2 Vaccination 04/16/2019, 05/07/2019, 12/11/2019   PPD Test 11/14/2022   Pneumococcal Polysaccharide-23 12/16/2018    Past Medical History:  Diagnosis Date   Abnormal Pap smear of cervix 1988   Acid reflux 01/17/2013   Acute respiratory failure with hypoxia (HCC) 02/19/2023   Allergic rhinitis    Allergy    Anemia    Anginal pain    Anxiety    Arthritis    Asthma    Bronchiectasis (HCC)    Cataract    Celiac disease    Cervical dysplasia 1988   CHF (congestive heart failure) (HCC)    Chronic kidney disease    Chronic respiratory failure with hypoxia (HCC) 05/10/2020   Complication of anesthesia    bp drops after anesthesia and has to be kept overnight; O2 drops; rash   COPD (chronic obstructive pulmonary disease) (HCC)    Coronary artery disease    Cough variant asthma    Grade I diastolic dysfunction    Headache    migraine with visual aura   Heart trouble    History of cardiac arrest    History of hiatal hernia    History of kidney stones    History of mammogram 03/25/2013; 12/07/14   BIRADS 1; NEG   History of Papanicolaou smear of cervix 03/20/12; 12/28/15   -/-; -/-   Hypercholesteremia    Hypertension    IBS (irritable bowel syndrome)    Menopausal symptoms    MI (myocardial infarction) (HCC)    Mitral valve prolapse    Pneumonia    Pneumonia 05/10/2020   PONV (postoperative nausea and vomiting)    nausea only   Septic shock (HCC)  12/13/2018   Severe sepsis (HCC) 12/13/2018   Sleep apnea    Vulvovaginitis    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     albuterol  (VENTOLIN  HFA) 108 (90 Base) MCG/ACT inhaler Inhale 2 puffs into the lungs as needed for wheezing or shortness of breath.     amoxicillin -clavulanate (AUGMENTIN ) 875-125 MG tablet Take 1 tablet by mouth 2 (two) times daily. Take medication with food. 14 tablet 0   aspirin  EC 81 MG tablet Take 81 mg by mouth daily. Swallow whole.     benzonatate  (TESSALON ) 200 MG capsule Take 1 capsule (200 mg total) by mouth 2 (two) times daily as needed for cough. 20 capsule 0   chlorpheniramine-HYDROcodone  (  TUSSIONEX) 10-8 MG/5ML Take 5 mLs by mouth 2 (two) times daily. 473 mL 0   clobetasol  ointment (TEMOVATE ) 0.05 % Apply to affected area every night for 4 weeks, then every other day for 4 weeks and then 1-2 times a week for maintenance 45 g 0   diphenhydrAMINE  (BENADRYL  ALLERGY) 25 MG tablet Take 25 mg by mouth as needed for itching.     famotidine  (PEPCID ) 40 MG tablet Take 40 mg by mouth at bedtime.     LORazepam  (ATIVAN ) 1 MG tablet Take 1 tablet (1 mg total) by mouth 2 (two) times daily. 60 tablet 2   losartan  (COZAAR ) 25 MG tablet Take 1 tablet (25 mg total) by mouth daily. 90 tablet 3   Melatonin 5 MG CAPS Take 5 mg by mouth at bedtime.     montelukast  (SINGULAIR ) 10 MG tablet Take 10 mg by mouth daily.     nebivolol  (BYSTOLIC ) 5 MG tablet Take 1 tablet (5 mg total) by mouth daily with supper. 90 tablet 3   nitroGLYCERIN  (NITROSTAT ) 0.4 MG SL tablet Place 1 tablet (0.4 mg total) under the tongue every 5 (five) minutes  as needed for chest pain. 25 tablet 3   nystatin cream (MYCOSTATIN) Apply 1 Application topically as needed (rash). 30 g 0   pantoprazole  (PROTONIX ) 40 MG tablet Take 1 tablet (40 mg total) by mouth 2 (two) times daily before a meal. 60 tablet 2   rosuvastatin  (CRESTOR ) 10 MG tablet Take 1 tablet (10 mg total) by mouth at bedtime. 90 tablet 1   TRELEGY ELLIPTA  200-62.5-25 MCG/ACT AEPB INHALE 1 ACT INTO THE LUNGS DAILY. 60 each 5   triamcinolone  cream (KENALOG ) 0.1 % Apply 1 Application topically as needed (break out).     Vitamin D , Ergocalciferol , (DRISDOL ) 1.25 MG (50000 UNIT) CAPS capsule Take 1 capsule (50,000 Units total) by mouth every 7 (seven) days. 12 capsule 1   No facility-administered medications prior to visit.    There were no vitals taken for this visit.   PFTs 2023 Consistent with moderate restrictive disease likely related to obesity   BP 120/60   Pulse 78   Temp 99.1 F (37.3 C)   Ht 5' 5 (1.651 m)   Wt 232 lb (105.2 kg)   LMP  (LMP Unknown)   SpO2 91%   BMI 38.61 kg/m   No acute respiratory distress + chronic cough S1-S2 no murmurs Clear to auscultation bilaterally no wheezing no rhonchi No edema No focal deficits   Lab Results:  CBC    Component Value Date/Time   WBC 12.6 (H) 06/21/2023 1140   WBC 7.8 02/20/2023 0708   RBC 5.01 06/21/2023 1140   RBC 5.13 (H) 06/21/2023 1140   HGB 14.1 06/21/2023 1140   HGB 11.9 (L) 06/10/2013 0209   HCT 45.7 06/21/2023 1140   HCT 36.1 06/10/2013 0209   PLT 273 06/21/2023 1140   PLT 214 06/10/2013 0209   MCV 89.1 06/21/2023 1140   MCV 92 06/10/2013 0209   MCH 27.5 06/21/2023 1140   MCHC 30.9 06/21/2023 1140   RDW 14.4 06/21/2023 1140   RDW 15.2 (H) 06/10/2013 0209   LYMPHSABS 1.5 06/21/2023 1140   LYMPHSABS 2.0 06/10/2013 0209   MONOABS 0.6 06/21/2023 1140   MONOABS 0.7 06/10/2013 0209   EOSABS 0.0 06/21/2023 1140   EOSABS 0.1 06/10/2013 0209   BASOSABS 0.1 06/21/2023 1140   BASOSABS 0.0 06/10/2013  0209    BMET    Component Value Date/Time  NA 137 02/20/2023 0708   NA 143 06/10/2013 0209   K 4.7 02/20/2023 0708   K 3.7 06/10/2013 0209   CL 106 02/20/2023 0708   CL 109 (H) 06/10/2013 0209   CO2 24 02/20/2023 0708   CO2 29 06/10/2013 0209   GLUCOSE 154 (H) 02/20/2023 0708   GLUCOSE 90 06/10/2013 0209   BUN 15 02/20/2023 0708   BUN 14 06/10/2013 0209   CREATININE 0.88 02/20/2023 0708   CREATININE 0.87 06/10/2013 0209   CALCIUM  8.9 02/20/2023 0708   CALCIUM  8.9 06/10/2013 0209   GFRNONAA >60 02/20/2023 0708   GFRNONAA >60 06/10/2013 0209   GFRAA >60 12/18/2018 0343   GFRAA >60 06/10/2013 0209      Assessment & Plan:  64 year old pleasant white female seen by me 4 years ago for chronic cough as well as underlying obstructive sleep apnea in the setting of obesity and deconditioned state with significant allergic rhinitis with underlying GERD with history of hiatal hernia in the setting of obesity, patient with signs symptoms of cough variant asthma   Assessment of OSA Previous AHI 21 Continue CPAP as prescribed  Excellent compliance report Reviewed compliance report in detail with patient Patient definitely benefits the use of CPAP therapy as prescribed Using CPAP nightly and with naps Pressure setting is comfortable and is sleeping well. CPAP prescription 4-10 AHI reduced to 2.3  No evidence of acute heart failure at this time No respiratory distress No fevers, chills, nausea, vomiting, diarrhea No evidence hemoptysis  Patient Instructions Need to be using your CPAP every night, minimum of 4-6 hours a night.  Change equipment as directed. Wash your tubing with warm soap and water  daily, hang to dry. Wash humidifier portion weekly. Use bottled, distilled water  and change daily Be aware of reduced alertness and do not drive or operate heavy machinery if experiencing this or drowsiness.  Exercise encouraged, as tolerated. Healthy weight management discussed.   Avoid or decrease alcohol  consumption and medications that make you more sleepy, if possible. Notify if persistent daytime sleepiness occurs even with consistent use of PAP therapy.   Change CPAP supplies... Every month Mask cushions and/or nasal pillows CPAP machine filters Every 3 months Mask frame (not including the headgear) CPAP tubing Every 6 months Mask headgear Chin strap (if applicable) Humidifier water  tub   Airway hyperreactivity due to allergic rhinitis Chronic cough likely due to Cough variant asthma Continue Trelegy inhaler therapy which is working Rinse mouth after use   GERD Hiatal hernia PPI twice daily Pepcid  twice daily  Chronic cough - Patient has had a chronic cough for >10 years. She has scarring to right middle lung and left lower lung on prior chest imaging. Cough is currently productive with yellow mucus. No associated shortness of breath. I suspect PND is driving a lot of her coughing symptoms.  Also associated with morbid obesity and restrictive lung disease along with uncontrolled GERD No more indication for gabapentin  Patient refuses bronchoscopy  Obesity -recommend significant weight loss -recommend changing diet  Deconditioned state -Recommend increased daily activity and exercise  Hx of respiratory failure Oxygen discontinued    MEDICATION ADJUSTMENTS/LABS AND TESTS ORDERED: Excellent compliance with CPAP Continue Trelegy Rinse mouth Albuterol  as needed Recommend weight loss Medications for GERD    CURRENT MEDICATIONS REVIEWED AT LENGTH WITH PATIENT TODAY   Patient  satisfied with Plan of action and management. All questions answered   Follow up 6 months   I spent a total of 45 minutes dedicated to  the care of this patient on the date of this encounter to include pre-visit review of records, face-to-face time with the patient discussing conditions above, post visit ordering of testing, clinical documentation with the  electronic health record, making appropriate referrals as documented, and communicating necessary information to the patient's healthcare team.    The Patient requires high complexity decision making for assessment and support, frequent evaluation and titration of therapies, application of advanced monitoring technologies and extensive interpretation of multiple databases.  Patient satisfied with Plan of action and management. All questions answered    Nickolas Alm Cellar, M.D.  [facility]  Medical Director [facility] [place]

## 2024-02-08 ENCOUNTER — Other Ambulatory Visit: Payer: Self-pay | Admitting: Cardiovascular Disease

## 2024-02-08 ENCOUNTER — Other Ambulatory Visit: Payer: Self-pay | Admitting: Family Medicine

## 2024-02-08 DIAGNOSIS — I10 Essential (primary) hypertension: Secondary | ICD-10-CM

## 2024-02-08 DIAGNOSIS — K219 Gastro-esophageal reflux disease without esophagitis: Secondary | ICD-10-CM

## 2024-02-08 DIAGNOSIS — I25118 Atherosclerotic heart disease of native coronary artery with other forms of angina pectoris: Secondary | ICD-10-CM

## 2024-02-10 ENCOUNTER — Ambulatory Visit: Payer: Self-pay | Admitting: Oncology

## 2024-02-11 NOTE — Progress Notes (Signed)
 Pt sent mychart message on 1/20 and recommendation already given to patient per MD instructions.

## 2024-02-27 ENCOUNTER — Ambulatory Visit: Admitting: Family Medicine

## 2024-03-09 ENCOUNTER — Ambulatory Visit: Admitting: Family Medicine

## 2024-03-10 ENCOUNTER — Other Ambulatory Visit

## 2024-05-04 ENCOUNTER — Ambulatory Visit: Admitting: Cardiovascular Disease

## 2024-06-23 ENCOUNTER — Ambulatory Visit: Admitting: Physician Assistant

## 2024-06-26 ENCOUNTER — Other Ambulatory Visit

## 2024-06-26 ENCOUNTER — Ambulatory Visit: Admitting: Oncology
